# Patient Record
Sex: Female | Born: 1945 | Race: White | Hispanic: No | Smoking: Current every day smoker
Health system: Southern US, Community
[De-identification: ages and names within clinical notes are randomized; demographics above are authoritative.]

## PROBLEM LIST (undated history)

## (undated) DIAGNOSIS — K5792 Diverticulitis of intestine, part unspecified, without perforation or abscess without bleeding: Secondary | ICD-10-CM

## (undated) DIAGNOSIS — T7840XA Allergy, unspecified, initial encounter: Secondary | ICD-10-CM

## (undated) DIAGNOSIS — E785 Hyperlipidemia, unspecified: Secondary | ICD-10-CM

## (undated) DIAGNOSIS — I10 Essential (primary) hypertension: Secondary | ICD-10-CM

## (undated) DIAGNOSIS — J449 Chronic obstructive pulmonary disease, unspecified: Secondary | ICD-10-CM

## (undated) DIAGNOSIS — E669 Obesity, unspecified: Secondary | ICD-10-CM

## (undated) DIAGNOSIS — E119 Type 2 diabetes mellitus without complications: Secondary | ICD-10-CM

## (undated) DIAGNOSIS — I639 Cerebral infarction, unspecified: Secondary | ICD-10-CM

## (undated) DIAGNOSIS — Z9889 Other specified postprocedural states: Secondary | ICD-10-CM

## (undated) DIAGNOSIS — G4700 Insomnia, unspecified: Secondary | ICD-10-CM

## (undated) DIAGNOSIS — B019 Varicella without complication: Secondary | ICD-10-CM

## (undated) DIAGNOSIS — I2510 Atherosclerotic heart disease of native coronary artery without angina pectoris: Secondary | ICD-10-CM

## (undated) DIAGNOSIS — M7071 Other bursitis of hip, right hip: Secondary | ICD-10-CM

## (undated) DIAGNOSIS — K635 Polyp of colon: Secondary | ICD-10-CM

## (undated) DIAGNOSIS — R112 Nausea with vomiting, unspecified: Secondary | ICD-10-CM

## (undated) DIAGNOSIS — F419 Anxiety disorder, unspecified: Secondary | ICD-10-CM

## (undated) DIAGNOSIS — Z9289 Personal history of other medical treatment: Secondary | ICD-10-CM

## (undated) DIAGNOSIS — M1990 Unspecified osteoarthritis, unspecified site: Secondary | ICD-10-CM

## (undated) HISTORY — DX: Chronic obstructive pulmonary disease, unspecified: J44.9

## (undated) HISTORY — DX: Personal history of other medical treatment: Z92.89

## (undated) HISTORY — PX: COLONOSCOPY: SHX174

## (undated) HISTORY — DX: Varicella without complication: B01.9

## (undated) HISTORY — PX: NASAL SINUS SURGERY: SHX719

## (undated) HISTORY — DX: Obesity, unspecified: E66.9

## (undated) HISTORY — DX: Allergy, unspecified, initial encounter: T78.40XA

## (undated) HISTORY — DX: Hyperlipidemia, unspecified: E78.5

## (undated) HISTORY — PX: ABDOMINAL HYSTERECTOMY: SHX81

## (undated) HISTORY — PX: WISDOM TOOTH EXTRACTION: SHX21

## (undated) HISTORY — DX: Insomnia, unspecified: G47.00

## (undated) HISTORY — DX: Unspecified osteoarthritis, unspecified site: M19.90

## (undated) HISTORY — DX: Polyp of colon: K63.5

## (undated) HISTORY — DX: Other bursitis of hip, right hip: M70.71

## (undated) HISTORY — PX: REPLACEMENT TOTAL KNEE: SUR1224

## (undated) HISTORY — PX: KNEE ARTHROPLASTY: SHX992

## (undated) HISTORY — DX: Diverticulitis of intestine, part unspecified, without perforation or abscess without bleeding: K57.92

## (undated) HISTORY — PX: ELBOW SURGERY: SHX618

## (undated) HISTORY — DX: Cerebral infarction, unspecified: I63.9

## (undated) HISTORY — PX: APPENDECTOMY: SHX54

## (undated) HISTORY — PX: TONSILLECTOMY AND ADENOIDECTOMY: SHX28

---

## 1998-05-04 HISTORY — PX: ABDOMINAL HYSTERECTOMY: SHX81

## 1998-06-05 ENCOUNTER — Ambulatory Visit (HOSPITAL_COMMUNITY): Admission: RE | Admit: 1998-06-05 | Discharge: 1998-06-05 | Payer: Self-pay | Admitting: *Deleted

## 1998-09-12 ENCOUNTER — Other Ambulatory Visit: Admission: RE | Admit: 1998-09-12 | Discharge: 1998-09-12 | Payer: Self-pay | Admitting: *Deleted

## 1999-07-28 ENCOUNTER — Ambulatory Visit (HOSPITAL_COMMUNITY): Admission: RE | Admit: 1999-07-28 | Discharge: 1999-07-28 | Payer: Self-pay | Admitting: *Deleted

## 1999-07-28 ENCOUNTER — Encounter: Payer: Self-pay | Admitting: *Deleted

## 1999-12-23 ENCOUNTER — Encounter: Admission: RE | Admit: 1999-12-23 | Discharge: 1999-12-23 | Payer: Self-pay | Admitting: *Deleted

## 1999-12-23 ENCOUNTER — Encounter: Payer: Self-pay | Admitting: *Deleted

## 1999-12-30 ENCOUNTER — Encounter: Admission: RE | Admit: 1999-12-30 | Discharge: 1999-12-30 | Payer: Self-pay | Admitting: *Deleted

## 1999-12-30 ENCOUNTER — Encounter: Payer: Self-pay | Admitting: *Deleted

## 2000-08-04 ENCOUNTER — Encounter: Payer: Self-pay | Admitting: *Deleted

## 2000-08-04 ENCOUNTER — Ambulatory Visit (HOSPITAL_COMMUNITY): Admission: RE | Admit: 2000-08-04 | Discharge: 2000-08-04 | Payer: Self-pay | Admitting: *Deleted

## 2000-08-11 ENCOUNTER — Other Ambulatory Visit: Admission: RE | Admit: 2000-08-11 | Discharge: 2000-08-11 | Payer: Self-pay | Admitting: *Deleted

## 2000-12-01 ENCOUNTER — Ambulatory Visit (HOSPITAL_COMMUNITY): Admission: RE | Admit: 2000-12-01 | Discharge: 2000-12-01 | Payer: Self-pay | Admitting: Gastroenterology

## 2000-12-01 ENCOUNTER — Encounter (INDEPENDENT_AMBULATORY_CARE_PROVIDER_SITE_OTHER): Payer: Self-pay

## 2001-08-09 ENCOUNTER — Encounter: Payer: Self-pay | Admitting: *Deleted

## 2001-08-09 ENCOUNTER — Ambulatory Visit (HOSPITAL_COMMUNITY): Admission: RE | Admit: 2001-08-09 | Discharge: 2001-08-09 | Payer: Self-pay | Admitting: *Deleted

## 2002-09-13 ENCOUNTER — Ambulatory Visit (HOSPITAL_COMMUNITY): Admission: RE | Admit: 2002-09-13 | Discharge: 2002-09-13 | Payer: Self-pay

## 2003-08-08 ENCOUNTER — Encounter: Admission: RE | Admit: 2003-08-08 | Discharge: 2003-08-08 | Payer: Self-pay | Admitting: Family Medicine

## 2003-11-14 ENCOUNTER — Ambulatory Visit (HOSPITAL_COMMUNITY): Admission: RE | Admit: 2003-11-14 | Discharge: 2003-11-14 | Payer: Self-pay | Admitting: Family Medicine

## 2004-10-07 ENCOUNTER — Ambulatory Visit (HOSPITAL_BASED_OUTPATIENT_CLINIC_OR_DEPARTMENT_OTHER): Admission: RE | Admit: 2004-10-07 | Discharge: 2004-10-07 | Payer: Self-pay | Admitting: Orthopaedic Surgery

## 2004-10-07 ENCOUNTER — Ambulatory Visit (HOSPITAL_COMMUNITY): Admission: RE | Admit: 2004-10-07 | Discharge: 2004-10-07 | Payer: Self-pay | Admitting: Orthopaedic Surgery

## 2004-12-11 ENCOUNTER — Ambulatory Visit (HOSPITAL_COMMUNITY): Admission: RE | Admit: 2004-12-11 | Discharge: 2004-12-11 | Payer: Self-pay | Admitting: Family Medicine

## 2005-01-02 ENCOUNTER — Encounter: Admission: RE | Admit: 2005-01-02 | Discharge: 2005-01-02 | Payer: Self-pay | Admitting: Family Medicine

## 2005-01-26 ENCOUNTER — Encounter (INDEPENDENT_AMBULATORY_CARE_PROVIDER_SITE_OTHER): Payer: Self-pay | Admitting: Specialist

## 2005-01-26 ENCOUNTER — Other Ambulatory Visit: Admission: RE | Admit: 2005-01-26 | Discharge: 2005-01-26 | Payer: Self-pay | Admitting: Interventional Radiology

## 2005-01-26 ENCOUNTER — Encounter: Admission: RE | Admit: 2005-01-26 | Discharge: 2005-01-26 | Payer: Self-pay | Admitting: Family Medicine

## 2005-05-26 ENCOUNTER — Inpatient Hospital Stay (HOSPITAL_COMMUNITY): Admission: RE | Admit: 2005-05-26 | Discharge: 2005-05-29 | Payer: Self-pay | Admitting: Orthopaedic Surgery

## 2005-12-07 ENCOUNTER — Ambulatory Visit: Payer: Self-pay | Admitting: Family Medicine

## 2005-12-14 ENCOUNTER — Ambulatory Visit (HOSPITAL_COMMUNITY): Admission: RE | Admit: 2005-12-14 | Discharge: 2005-12-14 | Payer: Self-pay | Admitting: Family Medicine

## 2005-12-14 LAB — HM MAMMOGRAPHY: HM Mammogram: NEGATIVE

## 2005-12-15 ENCOUNTER — Ambulatory Visit: Payer: Self-pay | Admitting: Family Medicine

## 2005-12-24 ENCOUNTER — Ambulatory Visit: Payer: Self-pay | Admitting: Family Medicine

## 2006-03-22 ENCOUNTER — Ambulatory Visit: Payer: Self-pay | Admitting: Family Medicine

## 2006-08-06 ENCOUNTER — Ambulatory Visit: Payer: Self-pay | Admitting: Family Medicine

## 2006-12-06 ENCOUNTER — Ambulatory Visit: Payer: Self-pay | Admitting: Family Medicine

## 2007-06-14 ENCOUNTER — Inpatient Hospital Stay (HOSPITAL_COMMUNITY): Admission: RE | Admit: 2007-06-14 | Discharge: 2007-06-17 | Payer: Self-pay | Admitting: Orthopaedic Surgery

## 2007-09-15 ENCOUNTER — Ambulatory Visit: Payer: Self-pay | Admitting: Family Medicine

## 2007-09-19 ENCOUNTER — Ambulatory Visit: Payer: Self-pay | Admitting: Family Medicine

## 2007-12-22 ENCOUNTER — Ambulatory Visit: Payer: Self-pay | Admitting: Family Medicine

## 2008-03-20 ENCOUNTER — Ambulatory Visit: Payer: Self-pay | Admitting: Family Medicine

## 2008-03-22 ENCOUNTER — Ambulatory Visit: Payer: Self-pay | Admitting: Family Medicine

## 2008-06-28 ENCOUNTER — Ambulatory Visit: Payer: Self-pay | Admitting: Family Medicine

## 2008-07-02 ENCOUNTER — Ambulatory Visit: Payer: Self-pay | Admitting: Family Medicine

## 2008-08-16 ENCOUNTER — Ambulatory Visit: Payer: Self-pay | Admitting: Family Medicine

## 2008-08-20 ENCOUNTER — Ambulatory Visit: Payer: Self-pay | Admitting: Family Medicine

## 2008-09-27 ENCOUNTER — Ambulatory Visit: Payer: Self-pay | Admitting: Family Medicine

## 2008-09-27 ENCOUNTER — Encounter: Admission: RE | Admit: 2008-09-27 | Discharge: 2008-09-27 | Payer: Self-pay | Admitting: Family Medicine

## 2008-10-03 ENCOUNTER — Encounter: Admission: RE | Admit: 2008-10-03 | Discharge: 2008-10-03 | Payer: Self-pay | Admitting: Family Medicine

## 2008-10-19 ENCOUNTER — Ambulatory Visit: Payer: Self-pay | Admitting: Family Medicine

## 2009-03-14 ENCOUNTER — Ambulatory Visit: Payer: Self-pay | Admitting: Family Medicine

## 2009-03-18 ENCOUNTER — Ambulatory Visit: Payer: Self-pay | Admitting: Family Medicine

## 2009-09-09 ENCOUNTER — Encounter: Payer: Self-pay | Admitting: Internal Medicine

## 2009-09-10 ENCOUNTER — Encounter: Payer: Self-pay | Admitting: Internal Medicine

## 2009-09-11 ENCOUNTER — Encounter: Payer: Self-pay | Admitting: Internal Medicine

## 2009-09-19 ENCOUNTER — Ambulatory Visit: Payer: Self-pay | Admitting: Family Medicine

## 2009-10-03 ENCOUNTER — Ambulatory Visit: Payer: Self-pay | Admitting: Family Medicine

## 2009-10-15 ENCOUNTER — Ambulatory Visit: Payer: Self-pay | Admitting: Family Medicine

## 2009-10-17 ENCOUNTER — Ambulatory Visit: Payer: Self-pay | Admitting: Family Medicine

## 2009-11-28 ENCOUNTER — Ambulatory Visit: Payer: Self-pay | Admitting: Family Medicine

## 2009-12-19 ENCOUNTER — Ambulatory Visit: Payer: Self-pay | Admitting: Family Medicine

## 2010-02-06 ENCOUNTER — Ambulatory Visit: Payer: Self-pay | Admitting: Family Medicine

## 2010-03-18 ENCOUNTER — Ambulatory Visit: Payer: Self-pay | Admitting: Infectious Diseases

## 2010-03-18 DIAGNOSIS — J329 Chronic sinusitis, unspecified: Secondary | ICD-10-CM | POA: Insufficient documentation

## 2010-03-18 DIAGNOSIS — E785 Hyperlipidemia, unspecified: Secondary | ICD-10-CM | POA: Insufficient documentation

## 2010-03-18 DIAGNOSIS — E1169 Type 2 diabetes mellitus with other specified complication: Secondary | ICD-10-CM | POA: Insufficient documentation

## 2010-03-18 HISTORY — DX: Chronic sinusitis, unspecified: J32.9

## 2010-03-31 ENCOUNTER — Ambulatory Visit: Payer: Self-pay | Admitting: Infectious Diseases

## 2010-03-31 ENCOUNTER — Telehealth: Payer: Self-pay

## 2010-03-31 ENCOUNTER — Inpatient Hospital Stay (HOSPITAL_COMMUNITY): Admission: EM | Admit: 2010-03-31 | Discharge: 2010-04-02 | Payer: Self-pay | Admitting: Emergency Medicine

## 2010-04-01 ENCOUNTER — Encounter (INDEPENDENT_AMBULATORY_CARE_PROVIDER_SITE_OTHER): Payer: Self-pay | Admitting: Internal Medicine

## 2010-04-02 ENCOUNTER — Telehealth: Payer: Self-pay | Admitting: Internal Medicine

## 2010-04-02 ENCOUNTER — Encounter: Payer: Self-pay | Admitting: Infectious Diseases

## 2010-04-07 ENCOUNTER — Ambulatory Visit: Payer: Self-pay | Admitting: Internal Medicine

## 2010-04-07 DIAGNOSIS — J42 Unspecified chronic bronchitis: Secondary | ICD-10-CM | POA: Insufficient documentation

## 2010-04-07 DIAGNOSIS — Z720 Tobacco use: Secondary | ICD-10-CM | POA: Insufficient documentation

## 2010-04-09 ENCOUNTER — Ambulatory Visit: Payer: Self-pay | Admitting: Internal Medicine

## 2010-04-09 ENCOUNTER — Encounter: Payer: Self-pay | Admitting: Internal Medicine

## 2010-04-09 DIAGNOSIS — J45909 Unspecified asthma, uncomplicated: Secondary | ICD-10-CM | POA: Insufficient documentation

## 2010-04-14 ENCOUNTER — Encounter (INDEPENDENT_AMBULATORY_CARE_PROVIDER_SITE_OTHER): Payer: Self-pay | Admitting: *Deleted

## 2010-05-16 ENCOUNTER — Ambulatory Visit
Admission: RE | Admit: 2010-05-16 | Discharge: 2010-05-16 | Payer: Self-pay | Source: Home / Self Care | Attending: Internal Medicine | Admitting: Internal Medicine

## 2010-05-16 ENCOUNTER — Encounter: Payer: Self-pay | Admitting: Internal Medicine

## 2010-05-20 LAB — CONVERTED CEMR LAB: IgE (Immunoglobulin E), Serum: 41.9 intl units/mL (ref 0.0–180.0)

## 2010-05-25 ENCOUNTER — Encounter: Payer: Self-pay | Admitting: Family Medicine

## 2010-05-28 ENCOUNTER — Ambulatory Visit
Admission: RE | Admit: 2010-05-28 | Discharge: 2010-05-28 | Payer: Self-pay | Source: Home / Self Care | Attending: Internal Medicine | Admitting: Internal Medicine

## 2010-06-03 NOTE — Assessment & Plan Note (Signed)
Summary: SOB, not feeling any better/tkk   CC:  Unable to walk even short distances without breathing problems.  She has started passing out while driving or at home.  History of Present Illness: 65 yo F with a hx of Staph infection after her sinus surgery 2010. Afterwards she was tx with inhaled vanco as well as diflucan. She has been on oral prednisone +/o antibiotics for the last 2 years. Several courses of augmentin, biaxin, erythro, cipro. :ast tx was levaquin 02-06-10 until now.  Has decreased tastes, sense of smell. Cough, some SOB.  had Cx Jan 2011 showing MSSA (R-Pen).   At her previous visit, she was started on doxycycline. has been taking but initially had difficulty due to n/v.  today has worsening SOB. has been having dizzy spells after coughing. Has been having trouble with breathing, especially at night. Has increasing amounts of mucus in her nose and her chest. Has worsening DOE. no f/c. Has been using increased frequency of her inhalers. albuterol q3-4 hours.   still no sense of taste/smell.    Preventive Screening-Counseling & Management  Alcohol-Tobacco     Alcohol drinks/day: 0     Smoking Status: never   Updated Prior Medication List: SPIRIVA HANDIHALER 18 MCG CAPS (TIOTROPIUM BROMIDE MONOHYDRATE) once daily AFRIN NASAL SPRAY 0.05 % SOLN (OXYMETAZOLINE HCL) as needed SYMBICORT 160-4.5 MCG/ACT AERO (BUDESONIDE-FORMOTEROL FUMARATE)  METFORMIN HCL 500 MG TABS (METFORMIN HCL) 750mg  two times a day ASPIR-LOW 81 MG TBEC (ASPIRIN) Take 1 tablet by mouth once a day ACCUPRIL 5 MG TABS (QUINAPRIL HCL) Take 1 tablet by mouth once a day DOXYCYCLINE HYCLATE 100 MG CAPS (DOXYCYCLINE HYCLATE) Take 1 tablet by mouth two times a day PROVENTIL HFA 108 (90 BASE) MCG/ACT AERS (ALBUTEROL SULFATE) 1-2 puffs as needed for wheezing/cough  Current Allergies (reviewed today): ! IBUPROFEN Additional History Menstrual Status:  postmenopausal  Review of Systems       no f/c  Vital  Signs:  Patient profile:   65 year old female Menstrual status:  postmenopausal Height:      64 inches (162.56 cm) Weight:      212.75 pounds (96.70 kg) BMI:     36.65 O2 Sat:      87 % on Room air Pulse rate:   105 / minute BP sitting:   160 / 109  (left arm) Cuff size:   regular  Vitals Entered By: Jennet Maduro RN (March 31, 2010 2:21 PM)  O2 Flow:  Room air CC: Unable to walk even short distances without breathing problems.  She has started passing out while driving or at home Is Patient Diabetic? Yes Did you bring your meter with you today? not checked this AM Pain Assessment Patient in pain? no      Nutritional Status BMI of > 30 = obese  Does patient need assistance? Functional Status Self care Ambulation Impaired:Risk for fall Comments can only walk very short distances.  Shortness of breath/wheezing     Menstrual Status postmenopausal   Physical Exam  General:  well-developed, well-nourished, well-hydrated, and overweight-appearing.   Eyes:  pupils equal, pupils round, and pupils reactive to light.   Mouth:  pharynx pink and moist and no exudates.   Lungs:  normal respiratory effort, R diffuse crackles, and L diffuse crackles.   Heart:  normal rate, regular rhythm, and no murmur.   Abdomen:  soft, non-tender, and normal bowel sounds.     Impression & Recommendations:  Problem # 1:  BRONCHITIS (ICD-490)  she has acute bronchitis and was hypoxic on presentation to the hospital. i discussed my concern about her worsening DOE and have asked that she be admitted to the hospital. she will return to ED, she wishes to go home prior to admission. encouraged her to go directly to ED. i offered to call [facility] but would prefer to travel by private car to [facility].  The following medications were removed from the medication list:    Levaquin 500 Mg Tabs (Levofloxacin) .Marland Kitchen... Take 1 tablet by mouth once a day Her updated medication list for this problem includes:     Spiriva Handihaler 18 Mcg Caps (Tiotropium bromide monohydrate) ..... Once daily    Symbicort 160-4.5 Mcg/act Aero (Budesonide-formoterol fumarate)    Doxycycline Hyclate 100 Mg Caps (Doxycycline hyclate) .Marland Kitchen... Take 1 tablet by mouth two times a day    Proventil Hfa 108 (90 Base) Mcg/act Aers (Albuterol sulfate) .Marland Kitchen... 1-2 puffs as needed for wheezing/cough  Orders: Atrovent 1mg  (Neb) [phone]) Nebulizer Tx (14782) Est. Patient Level IV (95621)   Medication Administration  Medication # 1:    Medication: Atrovent 1mg  (Neb)    Diagnosis: SINUSITIS, CHRONIC (ICD-473.9)    Dose: 25mg /3 mL    Route: inhaled    Exp Date: 04/04/2011    Lot #: [id number]    Mfr: nephron    Patient tolerated medication without complications    Given by: Jennet Maduro RN (March 31, 2010 3:06 PM)  Orders Added: 1)  Atrovent 1mg  (Neb) [Q4696] 2)  Nebulizer Tx [29528] 3)  Est. Patient Level IV [41324]

## 2010-06-03 NOTE — Assessment & Plan Note (Signed)
Summary: new pt chronic staph inf   CC:  New Patient/ staph infection x1 year.  History of Present Illness: 65 yo F with a hx of Staph infection after her sinus surgery 2010. Afterwards she was tx with inhaled vanco as well as diflucan. She has been on oral prednisone +/o antibiotics for the last 2 years. Several courses of augmentin, biaxin, erythro, cipro. :ast tx was levaquin 02-06-10 until now.  Has decreased tastes, sense of smell. Cough, some SOB.   had Cx Jan 2011 showing MSSA (R-Pen).   Preventive Screening-Counseling & Management  Alcohol-Tobacco     Alcohol drinks/day: 0     Smoking Status: never   Updated Prior Medication List: LEVAQUIN 500 MG TABS (LEVOFLOXACIN) Take 1 tablet by mouth once a day SPIRIVA HANDIHALER 18 MCG CAPS (TIOTROPIUM BROMIDE MONOHYDRATE) once daily AFRIN NASAL SPRAY 0.05 % SOLN (OXYMETAZOLINE HCL) as needed SYMBICORT 160-4.5 MCG/ACT AERO (BUDESONIDE-FORMOTEROL FUMARATE)  METFORMIN HCL 500 MG TABS (METFORMIN HCL) 750mg  two times a day ASPIR-LOW 81 MG TBEC (ASPIRIN) Take 1 tablet by mouth once a day ACCUPRIL 5 MG TABS (QUINAPRIL HCL) Take 1 tablet by mouth once a day  Current Allergies (reviewed today): ! 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 Take 1 Tablet By Mouth Once A Day 2)  Spiriva Handihaler 18 Mcg Caps (Tiotropium Bromide Monohydrate) .... Once Daily 3)  Afrin Nasal Spray 0.05 % Soln (Oxymetazoline Hcl) .... As Needed 4)  Symbicort 160-4.5 Mcg/act Aero (Budesonide-Formoterol Fumarate) 5)  Metformin Hcl 500 Mg Tabs (Metformin Hcl) .... 750mg  Two Times A Day 6)  Aspir-Low 81 Mg Tbec (Aspirin) .... Take 1 Tablet By Mouth Once A Day 7)  Accupril 5 Mg Tabs (Quinapril Hcl) .... Take 1 Tablet By Mouth Once A Day  Allergies (verified): 1)  ! Ibuprofen   Family History: bronchitis- mom and  sister  Social History: Never Smoked Alcohol use-no  Vital Signs:  Patient profile:   65 year old female Weight:      222.50 pounds Temp:     98.3 degrees F oral Pulse rate:   112 / minute BP sitting:   145 / 89  (right arm) Cuff size:   large  Vitals Entered By: Jimmy Footman, CMA (March 18, 2010 2:37 PM) CC: New Patient/ staph infection x1 year Is Patient Diabetic? Yes Did you bring your meter with you today? No   Physical Exam  General:  well-developed, well-nourished, well-hydrated, and overweight-appearing.   Head:  sinuses non-tender to percussion.  Eyes:  pupils equal, pupils round, and pupils reactive to light.   Mouth:  pharynx pink and moist and no exudates.   Neck:  no masses.   Lungs:  normal respiratory effort, R wheezes, and L wheezes.   Heart:  normal rate, regular rhythm, and no murmur.   Abdomen:  soft, non-tender, and normal bowel sounds.   Extremities:  no edema.    Impression & Recommendations:  Problem # 1:  SINUSITIS, CHRONIC (ICD-473.9)  She is doing some of the right things for her sinuses- agree with the lavage. Drying agents typically are not useful for chronic sinusitis. will change her antibiotics to doxycycline. It has some anti-inflamatory effects as well as the anti-staph effects. Could consider starting her on albuterol inhaler for her cough and lungs. Could possibly consider nebulized normal saline for improvement of her nasal drainage. She may  nee dot go back to see Dr Pollyann Kennedy for further eval. Could consider an oral anti-histamine as well. stop levaquin.  she will return to clinic in 1 month to assess her tolerance of the by mouth antibiotics.  Her updated medication list for this problem includes:    Levaquin 500 Mg Tabs (Levofloxacin) .Marland Kitchen... Take 1 tablet by mouth once a day    Afrin Nasal Spray 0.05 % Soln (Oxymetazoline hcl) .Marland Kitchen... As needed    Doxycycline Hyclate 100 Mg Caps (Doxycycline hyclate) .Marland Kitchen... Take 1 tablet by mouth two times a  day  Orders: Consultation Level IV (62130)  Medications Added to Medication List This Visit: 1)  Levaquin 500 Mg Tabs (Levofloxacin) .... Take 1 tablet by mouth once a day 2)  Spiriva Handihaler 18 Mcg Caps (Tiotropium bromide monohydrate) .... Once daily 3)  Afrin Nasal Spray 0.05 % Soln (Oxymetazoline hcl) .... As needed 4)  Symbicort 160-4.5 Mcg/act Aero (Budesonide-formoterol fumarate) 5)  Metformin Hcl 500 Mg Tabs (Metformin hcl) .... 750mg  two times a day 6)  Aspir-low 81 Mg Tbec (Aspirin) .... Take 1 tablet by mouth once a day 7)  Accupril 5 Mg Tabs (Quinapril hcl) .... Take 1 tablet by mouth once a day 8)  Doxycycline Hyclate 100 Mg Caps (Doxycycline hyclate) .... Take 1 tablet by mouth two times a day 9)  Proventil Hfa 108 (90 Base) Mcg/act Aers (Albuterol sulfate) .Marland Kitchen.. 1-2 puffs as needed for wheezing/cough Prescriptions: PROVENTIL HFA 108 (90 BASE) MCG/ACT AERS (ALBUTEROL SULFATE) 1-2 puffs as needed for wheezing/cough  #30 days x 4   Entered and Authorized by:   Johny Sax MD   Signed by:   Johny Sax MD on 03/18/2010   Method used:   Electronically to        [facility] [address] (retail)       [address] [place], [place]  [address]       Ph: [phone]       Fax: [phone]   RxID:   [id number] DOXYCYCLINE HYCLATE 100 MG CAPS (DOXYCYCLINE HYCLATE) Take 1 tablet by mouth two times a day  #120 x 1   Entered and Authorized by:   Johny Sax MD   Signed by:   Johny Sax MD on 03/18/2010   Method used:   Electronically to        [facility] [address] (retail)       [address] [place], [place]  [address]       Ph: [phone]       Fax: [phone]   RxID:   [id number]

## 2010-06-03 NOTE — Miscellaneous (Signed)
Summary: Orders Update pft charges  Clinical Lists Changes  Orders: Added new Service order of Carbon Monoxide diffusing w/capacity (94720) - Signed Added new Service order of Lung Volumes (94240) - Signed Added new Service order of Spirometry (Pre & Post) (94060) - Signed 

## 2010-06-03 NOTE — Progress Notes (Signed)
Summary: HFU- NEW CONSULT  Phone Note From Other Clinic   Caller: DR Pleas Koch Call For: [facility] Summary of Call: DR Mahala Menghini AT [facility] HAS REQUESTED THAT PT BE SEEN BY DR Elijha Dedman EITHER THIS FRI (MR NOT IN OFFICE) OR THE FOLLOWING MONDAY. SINCE I COULD NOT CHECK WITH NURSE AT THIS TIME (LUNCH) I OVERBOOKED BUT TOLD DR SAMTANI THAT WE WOULD CALL PT AT HER HOME # IF THIS WILL NEED TO BE MOVED. DR Mahala Menghini THOUGHT THAT MR WOULD BE THE BEST FIT FOR THIS PT. HIS PAGER # IS [phone] IF MR WOULD LIKE TO SPEAK TO HIM.  Initial call taken by: Tivis Ringer, CNA,  April 02, 2010 1:31 PM  Follow-up for Phone Call        This appt time is fine. Carron Curie CMA  April 03, 2010 11:01 AM      Appended Document: HFU- NEW CONSULT I paged him but no response. I see that I am seeing her on 04/07/2010 which I think is fine based on what I Read. FYI  Appended Document: HFU- NEW CONSULT noted

## 2010-06-03 NOTE — Assessment & Plan Note (Signed)
Summary: HFU-COPD///KP   Visit Type:  Initial Consult Copy to:  Dr Pleas Koch [facility] Primary Provider/Referring Provider:  Dr. Sharlot Gowda  CC:  hfu. Pt state sSOB is better while on prednisone.Marland Kitchen  History of Present Illness: 65 year old [place] Immigrant. EX-smoker. On ACE inhibitor kidney prophylasxis for diabetetes since 2006 but stopped past 5 days. Pre-2009 completely fine not even spring allergies. c/o dyspnea and sinus issues since 2 years, In Oct-2010 saw Dr. Allegra Grana ENT diagnosed as MSSA staph sinus infection. Multiple courses of prednisone and antibiotics since then. 12 courses of antibiotics and 6 courses of prednisone since then.   On chronic basis feels noses are plugged up and constant sinus drainage (yellow ealry part of day and gets clear later in day). Simulataneoulsy feels chest tightness. She feels sinuses and chest issues are inter-connected. She is having recurrent acute bronchitis/sinusists atleast monthly. During this time cough worsens, she wheezes, increased chest tightness, increased dyspnea orthopneic, nocturnal wheezing., sputum turns greyish-greenish. Feels that as soon as prednisone wears off she gets a recurrent episode. Prednisone bursts helps unclog sinuses but is giving her myalgias and hip pain.   Was diagnosed as asthma in [place] in 2009. Advisd symbicort but it took it prn  but in May 2011 while in [place] admitted. DC summary in [place] but mentions obstructive lung disease as best as I can tell. Serial spirometry sows obstruction. On 09/09/2009- Fev1 1L/43%, Ratio 56,. Cleda Daub several days later on 09/11/2009 shows fev1 inprovement to 1.68L. Symbicort changed to spiriva in [place]. Takes this without fail but unsure if it helps.  OF note, was on ACE inibiutor from 2006 till 5 days ago. Stopping this has not helped symptoms.  Preventive Screening-Counseling & Management  Alcohol-Tobacco     Smoking Status: quit     Packs/Day: 0.5     Year Started:  1975     Year Quit: 2007     Pack years: 12  Current Medications (verified): 1)  Spiriva Handihaler 18 Mcg Caps (Tiotropium Bromide Monohydrate) .... Once Daily 2)  Nasonex 50 Mcg/act Susp (Mometasone Furoate) .... One Spray Daily 3)  Symbicort 160-4.5 Mcg/act Aero (Budesonide-Formoterol Fumarate) 4)  Metformin Hcl 500 Mg Tabs (Metformin Hcl) .... 750mg  Two Times A Day 5)  Aspir-Low 81 Mg Tbec (Aspirin) .... Take 1 Tablet By Mouth Once A Day 6)  Accupril 5 Mg Tabs (Quinapril Hcl) .... Take 1 Tablet By Mouth Once A Day 7)  Doxycycline Hyclate 100 Mg Caps (Doxycycline Hyclate) .... Take 1 Tablet By Mouth Two Times A Day 8)  Proventil Hfa 108 (90 Base) Mcg/act Aers (Albuterol Sulfate) .Marland Kitchen.. 1-2 Puffs As Needed For Wheezing/cough 9)  Prednisone 20 Mg Tabs (Prednisone) .... 60mg  Per Day X 10 Days  Allergies (verified): 1)  ! Ibuprofen  Past History:  Past Surgical History: Knee replacement-both Sinus surgery 02-2009 Appendectomy Total Abdominal Hysterectomy  Social History: Alcohol use-no Patient states former smoker.  Started in 1975, quit in 2007  1/2ppd.  Married Retail buyer Status:  quit Packs/Day:  0.5 Pack years:  16  Review of Systems       The patient complains of shortness of breath with activity, productive cough, non-productive cough, nasal congestion/difficulty breathing through nose, and change in color of mucus.  The patient denies shortness of breath at rest, coughing up blood, chest pain, irregular heartbeats, acid heartburn, indigestion, loss of appetite, weight change, abdominal pain, difficulty swallowing, sore throat, tooth/dental problems, headaches, sneezing, itching, ear ache, anxiety, depression, hand/feet  swelling, joint stiffness or pain, rash, and fever.    Vital Signs:  Patient profile:   65 year old female Menstrual status:  postmenopausal Height:      64 inches Weight:      224 pounds BMI:     38.59 O2 Sat:      92 % on Room  air Temp:     97.8 degrees F oral Pulse rate:   106 / minute BP sitting:   126 / 82  (right arm) Cuff size:   large  Vitals Entered By: Carron Curie CMA (April 07, 2010 1:56 PM)  O2 Flow:  Room air CC: hfu. Pt state sSOB is better while on prednisone. Comments Medications reviewed with patient Carron Curie CMA  April 07, 2010 1:58 PM Daytime phone number verified with patient.    Physical Exam  General:  well developed, well nourished, in no acute distress. Laryngeal naure to cough Head:  normocephalic and atraumatic Eyes:  PERRLA/EOM intact; conjunctiva and sclera clear Ears:  TMs intact and clear with normal canals Nose:  no deformity, discharge, inflammation, or lesions Mouth:  no deformity or lesions Neck:  no masses, thyromegaly, or abnormal cervical nodes Chest Wall:  no deformities noted Lungs:  clear bilaterally to auscultation and percussion Heart:  regular rate and rhythm, S1, S2 without murmurs, rubs, gallops, or clicks Abdomen:  bowel sounds positive; abdomen soft and non-tender without masses, or organomegaly Msk:  no deformity or scoliosis noted with normal posture Pulses:  pulses normal Extremities:  no clubbing, cyanosis, edema, or deformity noted Neurologic:  CN II-XII grossly intact with normal reflexes, coordination, muscle strength and tone Skin:  intact without lesions or rashes Cervical Nodes:  no significant adenopathy Axillary Nodes:  no significant adenopathy Psych:  alert and cooperative; normal mood and affect; normal attention span and concentration   MISC. Report  Procedure date:  04/01/2010  Findings:      IgE 73 and normal eco 11/29 - nromal  CXR  Procedure date:  03/31/2010  Findings:      CHEST - 2 VIEW    Comparison: 08/08/2003    Findings: Trachea is midline.  Heart size normal.  Added density at   the posterior base of the chest, on the lateral view, is   superimposed with ribs and osteophytes.  No  definite  correlate on   the frontal view, and a summation shadow is likely.  No definite   airspace consolidation or pleural fluid.    IMPRESSION:   No acute findings.    Read By:  Reyes Ivan.,  M.D.   Released By:  Reyes Ivan.,  M.D.  Impression & Recommendations:  Problem # 1:  BRONCHITIS, RECURRENT (ICD-491.9) Assessment New  ? asthma, ? Copd, ? LPR Cough with VCD (cough has larygneal quality on exam). Cleda Daub from [place] shows reversible obstruction but i am curious why they stopped symbicort and started her on spiriva. I will get Full pfts and then review  Orders: Consultation Level V (62130)  Medications Added to Medication List This Visit: 1)  Nasonex 50 Mcg/act Susp (Mometasone furoate) .... One spray daily 2)  Prednisone 20 Mg Tabs (Prednisone) .... 60mg  per day x 10 days  Other Orders: Pulmonary Referral (Pulmonary)  Patient Instructions: 1)  please have full PFT tomorrow  2)  return to see me after that this week 3)  use netti pot saline wash daily   Immunization History:  Pneumovax Immunization History:  Pneumovax:  historical (04/05/2006)

## 2010-06-03 NOTE — Assessment & Plan Note (Addendum)
Summary: review pft//jrc   Visit Type:  Follow-up Copy to:  Dr Pleas [facility] Primary Provider/Referring Provider:  Dr. Sharlot Gowda - PMd, Dr Enedina Finner - ID, Dr. Allegra Grana - ENT, Dr. Marchelle Gearing - Pulmonary  CC:  Pt here to discuss pft results. .  History of Present Illness: 65 year old [place] Immigrant. EX-smoker. On ACE inhibitor kidney prophylasxis for diabetetes since 2006 but stopped past 5 days. Pre-2009 completely fine not even spring allergies. c/o dyspnea and sinus issues since 2 years, In Oct-2010 saw Dr. Allegra Grana ENT diagnosed as MSSA staph sinus infection. Multiple courses of prednisone and antibiotics since then. 12 courses of antibiotics and 6 courses of prednisone since then. Most recently she saw Dr. Enedina Finner of ID on11/28/2011 and she ws having near cough syncope and worsening dyspnea. Admitted trhough 04/02/2010. ECHO and basic labs normal and discharged on prednisone taper through 04/11/2010 and 1 month doxycycline  Of note, on chronic basis feels noses are plugged up and constant sinus drainage (yellow ealry part of day and gets clear later in day). Simulataneoulsy feels chest tightness. She feels sinuses and chest issues are inter-connected. She is having recurrent acute bronchitis/sinusists atleast monthly. During this time cough worsens, she wheezes, increased chest tightness, increased dyspnea orthopneic, nocturnal wheezing., sputum turns greyish-greenish. Feels that as soon as prednisone wears off she gets a recurrent episode. Prednisone bursts helps unclog sinuses but is giving her myalgias and hip pain.   She was diagnosed as asthma in [place] in 2009. Advisd symbicort but it took it prn  but in May 2011 while in [place] admitted. DC summary in [place] but mentions obstructive lung disease as best as I can tell. Serial spirometry sows obstruction. On 09/09/2009- Fev1 1L/43%, Ratio 56,. Cleda Daub several days later on 09/11/2009 shows fev1 inprovement to 1.68L.  Symbicort changed to spiriva in [place]. Takes this without fail but unsure if it helps.  OF note, was on ACE inibiutor from 2006 till 5 days ago. Stopping this has not helped symptoms.  REC: FULL PFTs adn followupo     April 09, 2010: Followup after PFTs. No interim complaints. Very frutstrated and anxious about her symptomatology. Worried she will decompensate once off steroids on 04/11/2010 or if she does not complete 1 month of doxycycline. Still feels staph infection is stuck in her sinuses. Asking about allergy refrral. Admits to occassional GERD needing tums 2 times per month.    Spirometry done today are completely normal. Fev1 is 2.25L/102%, FVC is 3.15L/104%, RAtio 71, Small airways 1.57L/62%. However, lung volumes and DLCO are elevated - TLC 6L/125%, DLCO 26.2/105%. The spirometry shows marked improved compare to the May 2011 spirometry in [place]. This suggests presence of ASTHMA. The elevated lung volumes and dlco are either reflective of this asthma or machine error.    Preventive Screening-Counseling & Management  Alcohol-Tobacco     Alcohol drinks/day: 0     Smoking Status: quit     Packs/Day: 0.5     Year Started: 1975     Year Quit: 2007     Pack years: 29  Current Medications (verified): 1)  Spiriva Handihaler 18 Mcg Caps (Tiotropium Bromide Monohydrate) .... Once Daily 2)  Nasonex 50 Mcg/act Susp (Mometasone Furoate) .... One Spray Daily 3)  Symbicort 160-4.5 Mcg/act Aero (Budesonide-Formoterol Fumarate) 4)  Metformin Hcl 500 Mg Tabs (Metformin Hcl) .... 750mg  Two Times A Day 5)  Aspir-Low 81 Mg Tbec (Aspirin) .... Take 1 Tablet By Mouth Once A  Day 6)  Doxycycline Hyclate 100 Mg Caps (Doxycycline Hyclate) .... Take 1 Tablet By Mouth Two Times A Day 7)  Proventil Hfa 108 (90 Base) Mcg/act Aers (Albuterol Sulfate) .Marland Kitchen.. 1-2 Puffs As Needed For Wheezing/cough 8)  Prednisone 20 Mg Tabs (Prednisone) .... 60mg  Per Day X 10 Days  Allergies (verified): 1)  !  Ibuprofen  Past History:  Past medical, surgical, family and social histories (including risk factors) reviewed, and no changes noted (except as noted below).  Past Medical History: Reviewed history from 03/18/2010 and no changes required. 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 Knee replacement-both Sinus surgery 02-2009 Appendectomy Total Abdominal Hysterectomy  Family History: Reviewed history from 03/18/2010 and no changes required. bronchitis- mom and sister  Social History: Reviewed history from 04/07/2010 and no changes required. Alcohol use-no Patient states former smoker.  Started in 1975, quit in 2007  1/2ppd.  Married Homemaker [place]  Review of Systems       The patient complains of prolonged cough.  The patient denies anorexia, fever, weight loss, weight gain, vision loss, decreased hearing, hoarseness, chest pain, syncope, dyspnea on exertion, peripheral edema, headaches, hemoptysis, abdominal pain, melena, hematochezia, severe indigestion/heartburn, hematuria, incontinence, genital sores, muscle weakness, suspicious skin lesions, transient blindness, difficulty walking, depression, unusual weight change, abnormal bleeding, enlarged lymph nodes, angioedema, breast masses, and testicular masses.    Vital Signs:  Patient profile:   65 year old female Menstrual status:  postmenopausal Height:      64 inches Weight:      222 pounds BMI:     38.24 O2 Sat:      96 % on Room air Temp:     97.2 degrees F oral Pulse rate:   89 / minute BP sitting:   128 / 82  (right arm) Cuff size:   regular  Vitals Entered By: Carron Curie CMA (April 09, 2010 3:54 PM)  O2 Flow:  Room air CC: Pt here to discuss pft results.  Comments Medications reviewed with patient Carron Curie CMA  April 09, 2010 3:56 PM Daytime phone number verified with  patient.    Physical Exam  General:  well developed, well nourished, in no acute distress. Laryngeal naure to cough. Clears throat Head:  normocephalic and atraumatic Eyes:  PERRLA/EOM intact; conjunctiva and sclera clear Ears:  TMs intact and clear with normal canals Nose:  no deformity, discharge, inflammation, or lesions Mouth:  no deformity or lesions Neck:  no masses, thyromegaly, or abnormal cervical nodes Chest Wall:  no deformities noted Lungs:  clear bilaterally to auscultation and percussion Heart:  regular rate and rhythm, S1, S2 without murmurs, rubs, gallops, or clicks Abdomen:  bowel sounds positive; abdomen soft and non-tender without masses, or organomegaly Msk:  no deformity or scoliosis noted with normal posture Pulses:  pulses normal Extremities:  no clubbing, cyanosis, edema, or deformity noted Neurologic:  CN II-XII grossly intact with normal reflexes, coordination, muscle strength and tone Skin:  intact without lesions or rashes Cervical Nodes:  no significant adenopathy Axillary Nodes:  no significant adenopathy Psych:  alert and cooperative; normal mood and affect; normal attention span and concentration   MISC. Report  Procedure date:  04/09/2010  Findings:      Spirometry done today are completely normal. Fev1 is 2.25L/102%, FVC is 3.15L/104%, RAtio 71, Small airways 1.57L/62%. However, lung volumes and DLCO are elevated - TLC 6L/125%, DLCO  26.2/105%. The spirometry shows marked improved compare to the May 2011 spirometry in [place]. This suggests presence of ASTHMA. The elevated lung volumes and dlco are either reflective of this asthma or machine error.   Impression & Recommendations:  Problem # 1:  COUGH (ICD-786.2) Assessment Unchanged Normalization of spirometries in Dec 2011 when compared to those freom [place] in May 2011 in the midst of prednisone burst strongly suggests she has asthma. In addition, I think sinus drainage, possible GERD are  playing arole in cough. ACE Inhibitor too was likely playing a role but she has been off it for a week. All these could have triggered and now perpetuated CYLCICAL/HABIT/LPR TYPE COUGH that has taken a life of its own. I have advocated very strongly a multimodal, aggressive, consistent, approach needing lot of patience and persistence  PLAN #ASTHMA  - stop spiriva  - take pro-air 2 puff as needed (similar to proventil)  - start symbicort 2 puff two times a day         - take samples and show technique   - start accolate 1 tab at bedtime   - will refer you to Dr Maple Hudson our allergist on return  - finish prednisone as directed on 04/11/2010  -if cough gets worse after 04/11/2010 return to our office for spirometry breathing test and acoute visit or call our doc on call #SINUS DRAINAGE  - continue nasonex 1 squirt daily - do netti pot saline wash daily  - ? needs 1 month doxycycline - will check with Dr Maple Hudson #ACID REFLUX (possible)  - take diet sheet and follow it  - take zegerid one capsule daily on emtpy stomach #CYCLICAL COUGH  - start neurontin as directed. If it makes you sleepy back off  - suck on sugarless candy like JOLLEY RANCHERS when you have urge to cough  - stay away from lisinopril forever  - I will get in touch with Dr. Ninetta Lights of ID and see if you really need to be on doxycycline for 1 month #FOLLLOWUP  - 1-2 months upon return from [place]  - Good luck  - flu shot at [facility] (she believes it is cheaper there)  Orders: Allergy Referral  (Allergy) Est. Patient Level IV ([address]109)  Medications Added to Medication List This Visit: 1)  Proair Hfa 108 (90 Base) Mcg/act Aers (Albuterol sulfate) .Marland Kitchen.. 1-2 puffs every 4-6 hours as needed 2)  Symbicort 160-4.5 Mcg/act Aero (Budesonide-formoterol fumarate) .... Two puffs twice daily.rinse mouth after use 3)  Zegerid Otc 20-1100 Mg Caps (Omeprazole-sodium bicarbonate) .... Take 1 capsue in morning on empty stomach 4)  Gabapentin 300 Mg  Caps (Gabapentin) .Marland Kitchen.. 1 cap once daily x 4 days, then 1 cap two times a day x next 4 days, then 1 cap three times a day to continue 5)  Zafirlukast 20 Mg Tabs (Zafirlukast) .... Take 1 tab daily  Patient Instructions: 1)  #ASTHMA 2)   - stop spiriva 3)   - take pro-air 2 puff as needed (similar to proventil) 4)   - start symbicort 2 puff two times a day 5)          - take samples and show technique 6)    - start accolate 1 tab at bedtime  7)   - will refer you to Dr Maple Hudson our allergist on return 8)   - finish prednisone as directed on 04/11/2010 9)   -if cough gets worse after 04/11/2010 return to our office for spirometry breathing test and  acoute visit or call our doc on call 10)  #SINUS DRAINAGE 11)   - continue nasonex 1 squirt daily 12)  - do netti pot saline wash daily 13)  #ACID REFLUX (possible) 14)   - take diet sheet and follow it 15)   - take zegerid one capsule daily on emtpy stomach 16)  #CYCLICAL COUGH 17)   - start neurontin as directed. If it makes you sleepy back off 18)   - suck on sugarless candy like JOLLEY RANCHERS when you have urge to cough 19)   - stay away from lisinopril forever 20)   - I will get in touch with Dr. Ninetta Lights of ID and see if you really need to be on doxycycline for 1 month 21)  #FOLLLOWUP 22)   - 1-2 months upon return from [place] 23)   - Good luck 24)   - flu shot at [facility] Prescriptions: PROAIR HFA 108 (90 BASE) MCG/ACT  AERS (ALBUTEROL SULFATE) 1-2 puffs every 4-6 hours as needed  #1 x 2   Entered and Authorized by:   Kalman Shan MD   Signed by:   Kalman Shan MD on 04/09/2010   Method used:   Electronically to        [facility] [address] (retail)       [address] [place], [place]  [address]       Ph: [phone]       Fax: [phone]   RxID:   [id number] ZAFIRLUKAST 20 MG TABS (ZAFIRLUKAST) take 1 tab daily  #30 x 6   Entered and Authorized by:   Kalman Shan MD   Signed  by:   Kalman Shan MD on 04/09/2010   Method used:   Electronically to        [facility] [address] (retail)       [address] [place], [place]  [address]       Ph: [phone]       Fax: [phone]   RxID:   [id number] GABAPENTIN 300 MG CAPS (GABAPENTIN) 1 cap once daily x 4 days, then 1 cap two times a day x next 4 days, then 1 cap three times a day to continue  #90 x 2   Entered and Authorized by:   Kalman Shan MD   Signed by:   Kalman Shan MD on 04/09/2010   Method used:   Electronically to        [facility] [phone]* (retail)       [address] [place], [place]  [address]       Ph: [phone]       Fax: [phone]   RxID:   [id number] ZEGERID OTC 20-1100 MG CAPS (OMEPRAZOLE-SODIUM BICARBONATE) take 1 capsue in morning on empty stomach  #30 x 6   Entered and Authorized by:   Kalman Shan MD   Signed by:   Kalman Shan MD on 04/09/2010   Method used:   Electronically to        [facility] [phone]* (retail)       [address] [place], [place]  [address]  Ph: [phone]       Fax: [phone]   RxID:   [id number] SYMBICORT 160-4.5 MCG/ACT  AERO (BUDESONIDE-FORMOTEROL FUMARATE) Two puffs twice daily.Rinse mouth after use  #1 x 6   Entered and Authorized by:   Kalman Shan MD   Signed by:   Kalman Shan MD on 04/09/2010   Method used:   Electronically to        [facility] [phone]* (retail)       [address] [place], [place]  [address]       Ph: [phone]       Fax: [phone]   RxID:   [id number]   Appended Document: review pft//jrc Derrek Monaco, Wondered if she really needs 1 month doxycycline. Please let me know asap before she heads out to [place] on tuesday next week 12/13. Thanks, Tametra Ahart  Appended Document: review pft//jrc spoke with MR and he spoke with ID and  they advised that pt needs to complete one month of doxycycline.  pt advsied to complete the course.

## 2010-06-05 NOTE — Consult Note (Signed)
Summary: [facility]  [place]   Imported By: Florinda Marker 04/02/2010 16:11:24  _____________________________________________________________________  External Attachment:    Type:   Image     Comment:   External Document

## 2010-06-05 NOTE — Assessment & Plan Note (Addendum)
Summary: rov ///kp   Visit Type:  Follow-up Copy to:  Dr Pleas [facility] Primary Provider/Referring Provider:  Dr. Sharlot Gowda - PMd, Dr Enedina Finner - ID, Dr. Allegra Grana - ENT, Dr. Marchelle Gearing - Pulmonary  CC:  Pt here for follow-up. Pt states she has been doing well. Pt c/o productive call and nasal congestion x 5 days.  Marland Kitchen  History of Present Illness: [place] Chronic cough.   May 28, 2010:  At last viisit Apr 09, 2010 I Initiated multi-modal Rx against sinus (netti pot, nasonex and finish 1 month doxy given to her by Dr Ninetta Lights), possible GERD (zegerid, diet) , asthma based on normalization of spirometries beween 2009 in [place] and Dec 2011 at [place] while on pred Rx (symbicort and accolate), and cyclical cough (neurontin) I also referred her to Dr. Maple Hudson for allergey evaluation. She has been compliant with above and today she states that with above cough resolved. She is in tears about her improvement. However, last week picked up a cold and having sinus congestion, runny nose and some mild cough related to cold. She still feels that her staph infiecton from 2 years ago is persistent in her sinuses becuae drainage is green though she has declined culture testing. Her allergy blood panel done few days ago is negative. No other complaints although she has questions about long term side efects of each of her medications.  She also wants to know if she can skip skinallergy testing if Dr Maple Hudson now feels pretest prob for allergy is low.   Preventive Screening-Counseling & Management  Alcohol-Tobacco     Alcohol drinks/day: 0     Smoking Status: quit     Packs/Day: 0.5     Year Started: 1975     Year Quit: 2007     Pack years: 18  Current Medications (verified): 1)  Nasonex 50 Mcg/act Susp (Mometasone Furoate) .... One Spray Daily 2)  Metformin Hcl 750 Mg Xr24h-Tab (Metformin Hcl) .... Take 1 Tablet By Mouth Two Times A Day 3)  Aspir-Low 81 Mg Tbec (Aspirin) .... Take  1 Tablet By Mouth Once A Day 4)  Symbicort 160-4.5 Mcg/act  Aero (Budesonide-Formoterol Fumarate) .... Two Puffs Twice Daily.rinse Mouth After Use 5)  Zegerid 20-1100 Mg Caps (Omeprazole-Sodium Bicarbonate) .... Take 1 By Mouth Once Daily 6)  Gabapentin 300 Mg Caps (Gabapentin) .... Take 2 By Mouth Once Daily 7)  Fish Oil 1200 Mg Caps (Omega-3 Fatty Acids) .... Take 2 By Mouth Once Daily 8)  Zafirlukast 20 Mg Tabs (Zafirlukast) .... Take 1 Tablet By Mouth Once A Day 9)  Proair Hfa 108 (90 Base) Mcg/act Aers (Albuterol Sulfate) .... 2 Puffs Every 4 Hours As Needed  Allergies (verified): 1)  ! Ibuprofen  Past History:  Past medical, surgical, family and social histories (including risk factors) reviewed, and no changes noted (except as noted below).  Past Medical History: Reviewed history from 03/18/2010 and no changes required. 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 Knee replacement-both Sinus surgery 02-2009 Appendectomy Total Abdominal Hysterectomy  Family History: Reviewed history from 05/16/2010 and no changes required. bronchitis- mom and sister Mother- alive Shon Millet- died aneurysm  Social History: Reviewed history from 04/07/2010 and no changes required. Alcohol use-no Patient states former smoker.  Started in 1975, quit in 2007  1/2ppd.  Married Homemaker [place]  Review of Systems       The patient complains  of productive cough, nasal congestion/difficulty breathing through nose, and change in color of mucus.  The patient denies shortness of breath with activity, shortness of breath at rest, non-productive cough, coughing up blood, chest pain, irregular heartbeats, acid heartburn, indigestion, loss of appetite, weight change, abdominal pain, difficulty swallowing, sore throat, tooth/dental problems, headaches, sneezing, itching, ear ache, anxiety, depression,  hand/feet swelling, joint stiffness or pain, rash, and fever.    Vital Signs:  Patient profile:   65 year old female Menstrual status:  postmenopausal Height:      64 inches Weight:      231.25 pounds BMI:     39.84 O2 Sat:      94 % on Room air Temp:     98.4 degrees F oral Pulse rate:   115 / minute BP sitting:   130 / 72  (right arm) Cuff size:   large  Vitals Entered By: Carron Curie CMA (May 28, 2010 10:51 AM)  O2 Flow:  Room air CC: Pt here for follow-up. Pt states she has been doing well. Pt c/o productive call and nasal congestion x 5 days.   Comments Medications reviewed with patient Carron Curie CMA  May 28, 2010 10:51 AM Daytime phone number verified with patient.    Physical Exam  General:  well developed, well nourished, in no acute distress. Not coughing Head:  normocephalic and atraumatic Eyes:  PERRLA/EOM intact; conjunctiva and sclera clear Ears:  TMs intact and clear with normal canals Nose:  no deformity, discharge, inflammation, or lesions Mouth:  no deformity or lesions Neck:  no masses, thyromegaly, or abnormal cervical nodes Chest Wall:  no deformities noted Lungs:  clear bilaterally to auscultation and percussion Heart:  regular rate and rhythm, S1, S2 without murmurs, rubs, gallops, or clicks Abdomen:  bowel sounds positive; abdomen soft and non-tender without masses, or organomegaly Msk:  no deformity or scoliosis noted with normal posture Pulses:  pulses normal Extremities:  no clubbing, cyanosis, edema, or deformity noted Neurologic:  CN II-XII grossly intact with normal reflexes, coordination, muscle strength and tone Skin:  intact without lesions or rashes Cervical Nodes:  no significant adenopathy Axillary Nodes:  no significant adenopathy Psych:  alert and cooperative; normal mood and affect; normal attention span and concentration   MISC. Report  Procedure date:  05/16/2010  Findings:      serum allergy test -  normal  Impression & Recommendations:  Problem # 1:  COUGH (ICD-786.2) Assessment Improved  #ASTHMA  - continue symbicort  - continue accolate #SINUS DRAINAGe  - continue netti pot - make sure you use bottle or distilled warm water and not tap water  - use nasonex  - take doxycycline 100mg  by mouth two times a day x 7 days for acute sinusitis  - if sinus issues persists, you might need more blood test to rule out wegners or churg strauss vasculitis (she prefers to wait on these) #ACID REFLLUX   - follow diet sheet - take it again  - due to cost issues finish zegerid and then switch to ranitidine 300mg  by mouth at bedtime  - ok to use fish oil for now (she prefers to continue fish oil for hyperlipidemai on account of intolerance to statins) #CYLICAL COUGH  - continue neurontin 300mg  by mouth two times a day (explained how it acts in cyclical cough, gave literature evidence) - aim to taper neurontin after few months #POSIBLE ALLERGY  - I will check with Dr Maple Hudson if skin test is  needed  Orders: Est. Patient Level IV (14782)  Problem # 2:  SINUSITIS, ACUTE (ICD-461.9) Assessment: New  Apears to be having another episode.   PLAN start doxycycline for 1 weeks  Her updated medication list for this problem includes:    Nasonex 50 Mcg/act Susp (Mometasone furoate) ..... One spray daily    Doxycycline Monohydrate 100 Mg Caps (Doxycycline monohydrate) ..... By mouth twice daily after meals  Orders: Est. Patient Level IV (95621)  Medications Added to Medication List This Visit: 1)  Metformin Hcl 750 Mg Xr24h-tab (Metformin hcl) .... Take 1 tablet by mouth two times a day 2)  Zafirlukast 20 Mg Tabs (Zafirlukast) .... Take 1 tablet by mouth once a day 3)  Proair Hfa 108 (90 Base) Mcg/act Aers (Albuterol sulfate) .... 2 puffs every 4 hours as needed 4)  Doxycycline Monohydrate 100 Mg Caps (Doxycycline monohydrate) .... By mouth twice daily after meals  Patient Instructions: 1)   #ASTHMA 2)   - continue symbicort 3)   - continue accolate 4)  #SINUS DRAINAGe 5)   - continue netti pot - make sure you use bottle or distilled warm water and not tap water 6)   - use nasonex 7)   - take doxycycline 100mg  by mouth two times a day x 7 days 8)   - if sinus issues persists, you might need more blood test 9)  #ACID REFLLUX 10)    - follow diet sheet - take it again 11)   - finish zegerid and then switch to ranitidine 300mg  by mouth at bedtime 12)   - ok to use fish oil for now 13)  #CYLICAL COUGH 14)   - continue neurontin 300mg  by mouth two times a day 15)  #ALLERGIEs 16)   - I will check with Dr.  Maple Hudson if you need skin test 17)  #FOLLOWUP 18)   - 3 months or sooner if needed 19)  - Prescriptions: DOXYCYCLINE MONOHYDRATE 100 MG  CAPS (DOXYCYCLINE MONOHYDRATE) By mouth twice daily after meals  #14 x 0   Entered and Authorized by:   Kalman Shan MD   Signed by:   Kalman Shan MD on 05/28/2010   Method used:   Electronically to        [facility] [id number] (retail)       [address] [place], [place]  [address]       Ph: [phone]       Fax: [phone]   RxID:   [id number]    Immunization History:  Influenza Immunization History:    Influenza:  historical (04/21/2010)   Appended Document: rov ///kp [name]  Due to $ issues she is wondering if she can forego skin allergy testing. She feels if pre-test prob is low so far based on blood test and clinical eval she wants to forego skin allergy testing for the time being  Your thoughts ?  Thanks MR  Appended Document: rov ///kp Certainly- If you and she are satisfied that her needs are met, then absolutely fine for her to cancel the planned return to me for allergy skin testing. I can see her again in the future if you need.   Appended Document: rov ///kp spoke tp patient husband and passed on hDr. Young's infor. he wil pass it onto wife. Most likely they  will cancel

## 2010-06-05 NOTE — Letter (Signed)
SummaryScience writer Pulmonary Care Appointment Letter  [facility] Pulmonary  [address]   [place], [place] [address]   Phone: [phone]  Fax: [phone]    04/14/2010 MRN: [id number]  LITA FLYNN [address] [place], [place]  [address]  Dear Ms. Koehne,   Our office is attempting to contact you about an appointment.  We have scheduled an allergy consult w/ Dr. Maple Hudson for Fri. Jan 13/2012 at 3 PM. Please call our office if you need to reschedule this. [phone].  Our registration staff is prepared to assist you with any questions you may have.    Thank you,   Nature conservation officer Pulmonary Division

## 2010-06-05 NOTE — Assessment & Plan Note (Signed)
Summary: ALLERGIES ///kp   Vital Signs:  Patient profile:   65 year old female Menstrual status:  postmenopausal Height:      64 inches Weight:      233 pounds BMI:     40.14 O2 Sat:      95 % on Room air Pulse rate:   89 / minute BP sitting:   140 / 90  (left arm) Cuff size:   regular  Vitals Entered By: Reynaldo Minium CMA (May 16, 2010 3:31 PM)  O2 Flow:  Room air CC: Allergy consult-Dr. Marchelle Gearing   Copy to:  Dr Pleas Koch [facility] Primary Provider/Referring Provider:  Dr. Sharlot Gowda - PMd, Dr Enedina Finner - ID, Dr. Allegra Grana - ENT, Dr. Marchelle Gearing - Pulmonary  CC:  Allergy consult-Dr. Marchelle Gearing.  History of Present Illness: May 16, 2010- Allergy consult-Dr. Marchelle Gearing. His note reviewed.  65 yoF noted onset of nasal congestion and wheezing 2 years ago. Initally worked with ENT Dr Pollyann Kennedy. Sinus surgery 2010. Hospitalized and dx'd w/ asthma during pollen season in [place] 2 years ago and most recently here in November, 2011. Saw Dr Ninetta Lights for Infectious Disease with sinusitis and treated prednisone and antibiotic with dx of MSSA sinusitis. Repeated courses of antibiotics and prednisone.  Ibuprofen makes chest tight. Statins cause itching and rash. Skin sensitive to soaps and cosmetics. Uses a cleaning lady, no pets. House gets musty when clsed up. Feather beds.  Quit smoking 2007. Moved from [place] 40 years ago, living here x 30 years.  Asthma History    Initial Asthma Severity Rating:    Age range: 12+ years    Symptoms: 0-2 days/week    Nighttime Awakenings: 0-2/month    Interferes w/ normal activity: no limitations    SABA use (not for EIB): 0-2 days/week    Asthma Severity Assessment: Intermittent   Preventive Screening-Counseling & Management  Alcohol-Tobacco     Alcohol drinks/day: 0     Smoking Status: quit     Packs/Day: 0.5     Year Started: 1975     Year Quit: 2007     Pack years: 1  Current Medications (verified): 1)  Nasonex 50  Mcg/act Susp (Mometasone Furoate) .... One Spray Daily 2)  Symbicort 160-4.5 Mcg/act Aero (Budesonide-Formoterol Fumarate) 3)  Metformin Hcl 500 Mg Tabs (Metformin Hcl) .... 750mg  Two Times A Day 4)  Aspir-Low 81 Mg Tbec (Aspirin) .... Take 1 Tablet By Mouth Once A Day 5)  Prednisone 20 Mg Tabs (Prednisone) .... 60mg  Per Day X 10 Days 6)  Symbicort 160-4.5 Mcg/act  Aero (Budesonide-Formoterol Fumarate) .... Two Puffs Twice Daily.rinse Mouth After Use 7)  Zegerid 20-1100 Mg Caps (Omeprazole-Sodium Bicarbonate) .... Take 1 By Mouth Once Daily 8)  Gabapentin 300 Mg Caps (Gabapentin) .... Take 2 By Mouth Once Daily 9)  Fish Oil 1200 Mg Caps (Omega-3 Fatty Acids) .... Take 2 By Mouth Once Daily  Allergies (verified): 1)  ! 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  Started in 1975, quit in 2007  1/2ppd.  Married Homemaker [place]  Risk Factors: Alcohol Use: 0 (05/16/2010)  Risk Factors: Smoking Status: quit (05/16/2010) Packs/Day: 0.5 (05/16/2010)  Family History: bronchitis- mom and sister Mother- alive Shon Millet- died aneurysm  Review of Systems      See HPI       The patient complains of productive cough, non-productive cough, headaches, and hand/feet swelling.  The patient denies shortness of breath with activity, shortness of breath at rest, coughing up blood, chest pain, irregular heartbeats, acid heartburn, indigestion, loss of appetite, weight change, abdominal pain, difficulty swallowing, sore throat, tooth/dental problems, nasal  congestion/difficulty breathing through nose, sneezing, itching, ear ache, anxiety, depression, joint stiffness or pain, rash, change in color of mucus, and fever.    Physical Exam  Additional Exam:  General: A/Ox3; pleasant and cooperative, NAD, SKIN: no rash, lesions NODES: no lymphadenopathy HEENT: [place]/AT, EOM- WNL, Conjuctivae- clear, PERRLA, TM-Scarring and perf Right, Nose- clear, Throat- clear and wnl, Mallampati  II NECK: Supple w/ fair ROM, JVD- none, normal carotid impulses w/o bruits Thyroid- normal to palpation, no stridor CHEST: coarse rhonchi R>L base,  HEART: RRR, no m/g/r heard, no JVD ABDOMEN: Soft and nl; nml bowel sounds; no organomegaly or masses noted ZOX:WRUE, nl pulses, no edema, cyanosis or clubbing NEURO: Grossly intact to observation      Impression & Recommendations:  Problem # 1:  SINUSITIS, CHRONIC (ICD-473.9)  We discussed infection as opposed to allergy and how to evaluate for an immune process.  Sensitive to motrin, but no hx of polyps She agrees to asses her IgE system with in vitro and skin testing.  Consider possibility of a vaculitis/ Wegener's or etc  Orders: T-Allergy Profile Region II-[place], [place], [place], [place], [place] [phone])  Problem # 2:  BRONCHITIS, RECURRENT (ICD-491.9) Question if this is a chronic obstructive / fixed asthma. She smoked 30 years and may mostly have tobacco related disease. She asks why Dr Marchelle Gearing ordered Accolate, then it was reported as "blocked" fromthis office by her drug store.   Medications Added to Medication List This Visit: 1)  Zegerid 20-1100 Mg Caps (Omeprazole-sodium bicarbonate) .... Take 1 by mouth once daily 2)  Gabapentin 300 Mg Caps (Gabapentin) .... Take 2 by mouth once daily 3)  Fish Oil 1200 Mg Caps (Omega-3 fatty acids) .... Take 2 by mouth once daily  Other Orders: Consultation Level III (98119)  Patient Instructions: 1)  Schedule return for allergy skin testing. Stop all antihistamines 3 days before skin  testing, including cold and allergy meds, otc sleep and cough meds.  2)  Lab 3)  We will help understand why Dr Marchelle Gearing had suggested accolate 4)  cc Dr Marchelle Gearing   Orders Added: 1)  Consultation Level III [14782] 2)  T-Allergy Profile Region [place], [place], [place], [place], [place] [[id number]]

## 2010-06-05 NOTE — Progress Notes (Signed)
Summary: having problems breathing   Phone Note Call from Patient   Summary of Call: Pt called c/o breathing is worse.  She is having sob and does not feel the Atbx is working.  Pt is requesting appt. and states she is able to wait until later this evening for appt.  Tomasita Morrow RN  March 31, 2010 9:58 AM

## 2010-06-06 NOTE — Miscellaneous (Signed)
Summary: HIPAA Restrictions  HIPAA Restrictions   Imported By: Florinda Marker 03/19/2010 16:00:55  _____________________________________________________________________  External Attachment:    Type:   Image     Comment:   External Document

## 2010-06-18 ENCOUNTER — Institutional Professional Consult (permissible substitution) (INDEPENDENT_AMBULATORY_CARE_PROVIDER_SITE_OTHER): Payer: 59 | Admitting: Internal Medicine

## 2010-06-18 ENCOUNTER — Encounter: Payer: Self-pay | Admitting: Internal Medicine

## 2010-06-18 DIAGNOSIS — R059 Cough, unspecified: Secondary | ICD-10-CM

## 2010-06-18 DIAGNOSIS — R05 Cough: Secondary | ICD-10-CM

## 2010-06-18 DIAGNOSIS — J45909 Unspecified asthma, uncomplicated: Secondary | ICD-10-CM

## 2010-06-18 DIAGNOSIS — J42 Unspecified chronic bronchitis: Secondary | ICD-10-CM

## 2010-06-18 DIAGNOSIS — J329 Chronic sinusitis, unspecified: Secondary | ICD-10-CM

## 2010-06-25 NOTE — Assessment & Plan Note (Addendum)
Summary: ALLERGY SKIN TEST/kp   Vital Signs:  Patient profile:   65 year old female Menstrual status:  postmenopausal Height:      64 inches Weight:      236 pounds BMI:     40.66 O2 Sat:      93 % on Room air Pulse rate:   100 / minute BP sitting:   112 / 66  (left arm) Cuff size:   large  Vitals Entered By: Reynaldo Minium CMA (June 18, 2010 10:39 AM)  O2 Flow:  Room air CC: skin testing   Copy to:  Dr Pleas Koch [facility] Primary Provider/Referring Provider:  Dr. Sharlot Gowda - PMd, Dr Enedina Finner - ID, Dr. Allegra Grana - ENT, Dr. Marchelle Gearing - Pulmonary  CC:  skin testing.  History of Present Illness: May 28, 2010: Dr Jane Canary note-  At last viisit Apr 09, 2010 I Initiated multi-modal Rx against sinus (netti pot, nasonex and finish 1 month doxy given to her by Dr Ninetta Lights), possible GERD (zegerid, diet) , asthma based on normalization of spirometries beween 2009 in [place] and Dec 2011 at [place] while on pred Rx (symbicort and accolate), and cyclical cough (neurontin) I also referred her to Dr. Maple Hudson for allergey evaluation. She has been compliant with above and today she states that with above cough resolved. She is in tears about her improvement. However, last week picked up a cold and having sinus congestion, runny nose and some mild cough related to cold. She still feels that her staph infiecton from 2 years ago is persistent in her sinuses becuae drainage is green though she has declined culture testing. Her allergy blood panel done few days ago is negative. No other complaints although she has questions about long term side efects of each of her medications.  She also wants to know if she can skip skinallergy testing if Dr Maple Hudson now feels pretest prob for allergy is low.   June 18, 2010- Asthma, Rhinitis, Cyclical cough, GERD (Dr Marchelle Gearing) Nurse-CC: skin testing She had responded to saline rinse and prolonged doxycycline as noted at last ov with Dr  Marchelle Gearing. Allergy profile 05/16/10- IgE 41.9, no specific elevations. She came to go forward with skin testing, but says she is feeling well now.  Skin test- Mild reactions to [place] grass and mixed tree pollens and to house dust mites.      Preventive Screening-Counseling & Management  Alcohol-Tobacco     Alcohol drinks/day: 0     Smoking Status: quit     Packs/Day: 0.5     Year Started: 1975     Year Quit: 2007     Pack years: 80  Current Medications (verified): 1)  Nasonex 50 Mcg/act Susp (Mometasone Furoate) .... One Spray Daily 2)  Metformin Hcl 750 Mg Xr24h-Tab (Metformin Hcl) .... Take 1 Tablet By Mouth Two Times A Day 3)  Aspir-Low 81 Mg Tbec (Aspirin) .... Take 1 Tablet By Mouth Once A Day 4)  Symbicort 160-4.5 Mcg/act  Aero (Budesonide-Formoterol Fumarate) .... Two Puffs Twice Daily.rinse Mouth After Use 5)  Zegerid 20-1100 Mg Caps (Omeprazole-Sodium Bicarbonate) .... Take 1 By Mouth Once Daily 6)  Gabapentin 300 Mg Caps (Gabapentin) .... Take 2 By Mouth Once Daily 7)  Fish Oil 1200 Mg Caps (Omega-3 Fatty Acids) .... Take 2 By Mouth Once Daily 8)  Zafirlukast 20 Mg Tabs (Zafirlukast) .... Take 1 Tablet By Mouth Once A Day 9)  Proair Hfa 108 (90 Base) Mcg/act Aers (Albuterol Sulfate) .Marland KitchenMarland KitchenMarland Kitchen  2 Puffs Every 4 Hours As Needed  Allergies (verified): 1)  ! 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  Started in 1975, quit in 2007  1/2ppd.  Married Homemaker [place]  Risk Factors: Alcohol Use: 0 (06/18/2010)  Risk Factors: Smoking Status: quit  (06/18/2010) Packs/Day: 0.5 (06/18/2010)  Review of Systems      See HPI  The patient denies shortness of breath with activity, shortness of breath at rest, productive cough, non-productive cough, coughing up blood, chest pain, irregular heartbeats, acid heartburn, indigestion, loss of appetite, weight change, abdominal pain, difficulty swallowing, sore throat, tooth/dental problems, headaches, nasal congestion/difficulty breathing through nose, and sneezing.    Physical Exam  Additional Exam:  General: A/Ox3; pleasant and cooperative, NAD, SKIN: no rash, lesions NODES: no lymphadenopathy HEENT: [place]/AT, EOM- WNL, Conjuctivae- clear, PERRLA, TM-Scarring and perf Right, Nose- clear, Throat- clear and wnl, Mallampati  II NECK: Supple w/ fair ROM, JVD- none, normal carotid impulses w/o bruits Thyroid- normal to palpation, no stridor CHEST: clearer than last viist- no cough or wheeze  HEART: RRR, no m/g/r heard, no JVD ABDOMEN: Soft and nl; nml bowel sounds; no organomegaly or masses noted NUU:VOZD, nl pulses, no edema, cyanosis or clubbing NEURO: Grossly intact to observation      Impression & Recommendations:  Problem # 1:  SINUSITIS, CHRONIC (ICD-473.9) There is little active IgE mediated. reaginic allergy process evident now. I discussed environmental precautions for dust etc. I will be happy to see her again if needed.   Problem # 2:  ASTHMA, UNSPECIFIED, UNSPECIFIED STATUS (ICD-493.90) Improved for now. Dr Marchelle Gearing will follow .  Other Orders: Est. Patient Level III (66440) Allergy Puncture Test (34742) Allergy I.D Test (59563)  Patient Instructions: 1)  Please schedule a follow-up appointment as needed. 2)  Allergy seems at most to be a mild aggravating factor for your breathing discomfort. If other treatments fail, then we can look at whether an allergy treatment such as allergy shots would be of enough value to talk about, but for now I would not recommend it .  3)  To  f/u with Dr Marchelle Gearing   Orders Added: 1)  Est. Patient Level III [87564] 2)  Allergy Puncture Test [95004] 3)  Allergy I.D Test [33295]

## 2010-07-01 NOTE — Miscellaneous (Signed)
Summary: Skin test/[facility]  Skin test/[facility]   Imported By: Sherian Rein 06/25/2010 11:57:31  _____________________________________________________________________  External Attachment:    Type:   Image     Comment:   External Document

## 2010-07-09 ENCOUNTER — Encounter: Payer: Self-pay | Admitting: *Deleted

## 2010-07-16 LAB — HEMOGLOBIN A1C
Hgb A1c MFr Bld: 6.7 % — ABNORMAL HIGH (ref <5.7)
Mean Plasma Glucose: 146 mg/dL — ABNORMAL HIGH (ref <117)

## 2010-07-16 LAB — CARDIAC PANEL(CRET KIN+CKTOT+MB+TROPI)
CK, MB: 1.8 ng/mL (ref 0.3–4.0)
CK, MB: 1.8 ng/mL (ref 0.3–4.0)
CK, MB: 2 ng/mL (ref 0.3–4.0)
Relative Index: 1.4 (ref 0.0–2.5)
Relative Index: 1.4 (ref 0.0–2.5)
Relative Index: 1.6 (ref 0.0–2.5)
Total CK: 116 U/L (ref 7–177)
Total CK: 130 U/L (ref 7–177)
Total CK: 144 U/L (ref 7–177)
Troponin I: 0.01 ng/mL (ref 0.00–0.06)
Troponin I: 0.01 ng/mL (ref 0.00–0.06)
Troponin I: 0.01 ng/mL (ref 0.00–0.06)

## 2010-07-16 LAB — URINE CULTURE
Colony Count: 9000
Culture  Setup Time: 201111290128

## 2010-07-16 LAB — HEPATIC FUNCTION PANEL
ALT: 15 U/L (ref 0–35)
AST: 19 U/L (ref 0–37)
Albumin: 3.7 g/dL (ref 3.5–5.2)
Alkaline Phosphatase: 52 U/L (ref 39–117)
Bilirubin, Direct: 0.1 mg/dL (ref 0.0–0.3)
Indirect Bilirubin: 0.6 mg/dL (ref 0.3–0.9)
Total Bilirubin: 0.7 mg/dL (ref 0.3–1.2)
Total Protein: 7.4 g/dL (ref 6.0–8.3)

## 2010-07-16 LAB — COMPREHENSIVE METABOLIC PANEL
ALT: 14 U/L (ref 0–35)
AST: 18 U/L (ref 0–37)
Albumin: 3.3 g/dL — ABNORMAL LOW (ref 3.5–5.2)
Alkaline Phosphatase: 46 U/L (ref 39–117)
BUN: 13 mg/dL (ref 6–23)
CO2: 24 mEq/L (ref 19–32)
Calcium: 8.9 mg/dL (ref 8.4–10.5)
Chloride: 106 mEq/L (ref 96–112)
Creatinine, Ser: 0.59 mg/dL (ref 0.4–1.2)
GFR calc Af Amer: >60 mL/min (ref >60)
GFR calc non Af Amer: >60 mL/min (ref >60)
Glucose, Bld: 171 mg/dL — ABNORMAL HIGH (ref 70–99)
Potassium: 4.3 mEq/L (ref 3.5–5.1)
Sodium: 139 mEq/L (ref 135–145)
Total Bilirubin: 0.5 mg/dL (ref 0.3–1.2)
Total Protein: 6.5 g/dL (ref 6.0–8.3)

## 2010-07-16 LAB — MAGNESIUM: Magnesium: 1.8 mg/dL (ref 1.5–2.5)

## 2010-07-16 LAB — CULTURE, BLOOD (ROUTINE X 2)
Culture  Setup Time: 201111290233
Culture  Setup Time: 201111290233
Culture: NO GROWTH
Culture: NO GROWTH

## 2010-07-16 LAB — URINE MICROSCOPIC-ADD ON: Urine-Other: NONE SEEN

## 2010-07-16 LAB — D-DIMER, QUANTITATIVE: D-Dimer, Quant: 0.37 ug/mL-FEU (ref 0.00–0.48)

## 2010-07-16 LAB — POCT I-STAT, CHEM 8
BUN: 16 mg/dL (ref 6–23)
Calcium, Ion: 1.07 mmol/L — ABNORMAL LOW (ref 1.12–1.32)
Chloride: 102 mEq/L (ref 96–112)
Creatinine, Ser: 0.7 mg/dL (ref 0.4–1.2)
Glucose, Bld: 106 mg/dL — ABNORMAL HIGH (ref 70–99)
HCT: 48 % — ABNORMAL HIGH (ref 36.0–46.0)
Hemoglobin: 16.3 g/dL — ABNORMAL HIGH (ref 12.0–15.0)
Potassium: 3.9 mEq/L (ref 3.5–5.1)
Sodium: 138 mEq/L (ref 135–145)
TCO2: 27 mmol/L (ref 0–100)

## 2010-07-16 LAB — LIPID PANEL
Cholesterol: 242 mg/dL — ABNORMAL HIGH (ref 0–200)
HDL: 34 mg/dL — ABNORMAL LOW (ref >39)
LDL Cholesterol: 137 mg/dL — ABNORMAL HIGH (ref 0–99)
Total CHOL/HDL Ratio: 7.1 RATIO
Triglycerides: 353 mg/dL — ABNORMAL HIGH (ref <150)
VLDL: 71 mg/dL — ABNORMAL HIGH (ref 0–40)

## 2010-07-16 LAB — GLUCOSE, CAPILLARY
Glucose-Capillary: 118 mg/dL — ABNORMAL HIGH (ref 70–99)
Glucose-Capillary: 131 mg/dL — ABNORMAL HIGH (ref 70–99)
Glucose-Capillary: 133 mg/dL — ABNORMAL HIGH (ref 70–99)
Glucose-Capillary: 202 mg/dL — ABNORMAL HIGH (ref 70–99)
Glucose-Capillary: 213 mg/dL — ABNORMAL HIGH (ref 70–99)
Glucose-Capillary: 302 mg/dL — ABNORMAL HIGH (ref 70–99)

## 2010-07-16 LAB — DIFFERENTIAL
Basophils Absolute: 0 10*3/uL (ref 0.0–0.1)
Basophils Relative: 0 % (ref 0–1)
Eosinophils Absolute: 1 10*3/uL — ABNORMAL HIGH (ref 0.0–0.7)
Eosinophils Relative: 11 % — ABNORMAL HIGH (ref 0–5)
Lymphocytes Relative: 36 % (ref 12–46)
Lymphs Abs: 3.3 10*3/uL (ref 0.7–4.0)
Monocytes Absolute: 0.4 10*3/uL (ref 0.1–1.0)
Monocytes Relative: 4 % (ref 3–12)
Neutro Abs: 4.5 10*3/uL (ref 1.7–7.7)
Neutrophils Relative %: 49 % (ref 43–77)

## 2010-07-16 LAB — EXPECTORATED SPUTUM ASSESSMENT W GRAM STAIN, RFLX TO RESP C

## 2010-07-16 LAB — CBC
HCT: 38.4 % (ref 36.0–46.0)
HCT: 43.3 % (ref 36.0–46.0)
Hemoglobin: 13.5 g/dL (ref 12.0–15.0)
Hemoglobin: 15 g/dL (ref 12.0–15.0)
MCH: 29.4 pg (ref 26.0–34.0)
MCH: 29.6 pg (ref 26.0–34.0)
MCHC: 34.7 g/dL (ref 30.0–36.0)
MCHC: 34.9 g/dL (ref 30.0–36.0)
MCV: 84.7 fL (ref 78.0–100.0)
MCV: 84.8 fL (ref 78.0–100.0)
Platelets: 266 10*3/uL (ref 150–400)
Platelets: 303 10*3/uL (ref 150–400)
RBC: 4.54 MIL/uL (ref 3.87–5.11)
RBC: 5.11 MIL/uL (ref 3.87–5.11)
RDW: 13.6 % (ref 11.5–15.5)
RDW: 13.8 % (ref 11.5–15.5)
WBC: 6.8 10*3/uL (ref 4.0–10.5)
WBC: 9.2 10*3/uL (ref 4.0–10.5)

## 2010-07-16 LAB — URINALYSIS, ROUTINE W REFLEX MICROSCOPIC
Bilirubin Urine: NEGATIVE
Glucose, UA: NEGATIVE mg/dL
Hgb urine dipstick: NEGATIVE
Ketones, ur: NEGATIVE mg/dL
Leukocytes, UA: NEGATIVE
Nitrite: NEGATIVE
Protein, ur: 100 mg/dL — AB
Specific Gravity, Urine: 1.022 (ref 1.005–1.030)
Urobilinogen, UA: 0.2 mg/dL (ref 0.0–1.0)
pH: 5.5 (ref 5.0–8.0)

## 2010-07-16 LAB — CULTURE, RESPIRATORY W GRAM STAIN: Culture: NORMAL

## 2010-07-16 LAB — IGE: IgE (Immunoglobulin E), Serum: 72.5 IU/mL (ref 0.0–180.0)

## 2010-07-16 LAB — TSH: TSH: 0.29 u[IU]/mL — ABNORMAL LOW (ref 0.350–4.500)

## 2010-07-16 LAB — CLOSTRIDIUM DIFFICILE BY PCR: Toxigenic C. Difficile by PCR: NOT DETECTED

## 2010-08-15 ENCOUNTER — Other Ambulatory Visit: Payer: Self-pay | Admitting: Internal Medicine

## 2010-09-16 NOTE — Discharge Summary (Signed)
[name], [name]               ACCOUNT NO.:  [id number]   MEDICAL RECORD NO.:  [id number]          PATIENT TYPE:  INP   LOCATION:  5024                         FACILITY:  [facility]   PHYSICIAN:  Jennifer Basque. Dalldorf, M.D.DATE OF BIRTH:  [birth year]   DATE OF ADMISSION:  06/14/2007  DATE OF DISCHARGE:  06/17/2007                               DISCHARGE SUMMARY   ADMISSION DIAGNOSES:  1. Left knee end-stage degenerative joint disease.  2. Diabetes mellitus.  3. History of right total knee replacement and hypercholesterolemia.   DISCHARGE DIAGNOSES:  1. Left knee end-stage degenerative joint disease.  2. Diabetes mellitus.  3. History of right total knee replacement and hypercholesterolemia.   OPERATION:  Left total knee replacement.   BRIEF HISTORY:  [name] is a 65 year old white female patient well  known to our practice with increasing left knee pain.  On x-ray, she has  end-stage DJD and has been having pain with every step.  She has failed  viscosupplementation injections, oral anti-inflammatory medicines, and  corticosteroid injections.  We have discussed with her total knee  replacement, as described, with the risk of anesthesia, infection, DVT,  and possible death.   PERTINENT LABORATORY/X-RAY FINDINGS:  WBC 7.8, hemoglobin 15.1,  hematocrit 43.4, platelets 293.  Sodium 137, potassium 4.1, BUN 17,  creatinine 0.81, calcium 9.8.   Normal sinus rhythm on EKG.   COURSE IN THE HOSPITAL:  She was admitted postoperatively and was placed  on a variety of p.o. and IM analgesics.  A PCA Dilaudid pump was used.  She was also on a glycemic control order sheet for non-pregnant adults.  Coumadin and Lovenox protocol for DVT prophylaxis, per pharmacy.  IV  Ancef 1 gm q.8h. x3 doses.  Ferrous sulfate and another oral medication  such as antispasmodics, antiemetics.  CPM machine 0-50 degrees and  advanced as tolerated, 10 degrees daily.  Knee high TEDs.  Incentive  spirometry.   Her  first day postop, her vital signs were stable.  Blood pressure  114/71, temperature 99, hemoglobin 15.1, glucose 165, INR 1.1.  Breath  sounds in all lung fields.  Abdomen was soft.  The knee was without  signs of infection or irritation.  The Foley catheter was discontinued  that day.   The second day postop, her wound was checked.  Her wound was benign.  Dressing was changed.  Hemovac was pulled.  No signs of infection or  irritation.  Lack of calf tenderness.   Third day postop, she continued to work with physical therapy.  Weightbearing as tolerated.  Was discharged home.   CONDITION ON DISCHARGE:  She will be on the following medicines:  1. Simvastatin 40 mg daily.  2. Tricor 145 mg daily.  3. Metformin 850 mg 1/2 b.i.d.  4. Omega 3 daily.  5. Pharmacy regulation of Coumadin, which now is 5 mg a day.  6. Percocet 1-2 q.4-6h. p.r.n. pain.   She can be on a low sodium, heart-healthy diet, change her dressing  daily, weightbearing as tolerated.  [facility] will come to her  house for INR checks and  physical therapy.  She will return to Dr.  Nolon Nations office in 10 days.  Any sign of infection before then, she  will call the office number, [phone], to make an appointment.      [name], P.A.      [name], M.D.  Electronically Signed    MC/MEDQ  D:  06/17/2007  T:  06/18/2007  Job:  [id number]

## 2010-09-16 NOTE — Op Note (Signed)
[name], [name]               ACCOUNT NO.:  [id number]   MEDICAL RECORD NO.:  [id number]          PATIENT TYPE:  INP   LOCATION:  2899                         FACILITY:  [facility]   PHYSICIAN:  Lubertha Basque. Dalldorf, M.D.DATE OF BIRTH:  05-28-1945   DATE OF PROCEDURE:  06/14/2007  DATE OF DISCHARGE:                               OPERATIVE REPORT   PREOPERATIVE DIAGNOSIS:  Left knee degenerative joint disease.   POSTOPERATIVE DIAGNOSIS:  Left knee degenerative joint disease.   PROCEDURE:  Left total knee replacement.   ANESTHESIA:  General and block.   ATTENDING SURGEON:  Lubertha Basque. Jerl Santos, M.D.   ASSISTANT:  Lindwood Qua, P.A.   INDICATIONS FOR PROCEDURE:  The patient is a 65 year old woman with a  long history of bilateral arthritis of the knees.  She is status post a  successful replacement 2 years ago on the opposite side.  She has pain  on the left which is limiting to her in terms of being able to walk and  rest.  On x-ray she has advanced degenerative change.  She has failed  conservative measures of oral anti-inflammatories and injectables.  She  is offered a knee replacement operation.  Informed operative consent was  obtained discussion of the possible complications of reaction to  anesthesia, infection, DVT, PE, and death.   SUMMARY OF FINDINGS AND PROCEDURE:  Under general anesthesia and a block  through a longitudinal anterior incision, a left knee replacement was  performed.  She had advanced degenerative change, medial and  patellofemoral, and excellent bone quality.  She had a moderate varus  deformity, which we corrected and used components similar to the  opposite side.  We used DePuy LCS knee replacement parts using a  standard femur, 3 tibia, 15-mm deep-dish spacer, and a 35-mm all  polyethylene patella.  She had a staple in the knee, which we left in  place as it was not contacting the components or interfering with the  cutting guides.  [name]  assisted throughout and was invaluable to  the completion of the case in that he helped position and retract and  closed silmotaneously to help minimize OR time.   DISCRIPTION OF PROCEDURE:  The patient was taken to the OR suite where  GA was applied without difficulty.  She was also given a block in the  preanesthesia area.  She was prepped and draped in normal sterile  fashion.  All appropriate anti-infective measures were used including  preop IV antibiotics, betadine drape, hoods for surgical team.  A  standard approach was taken to the knee after exanguination and  inflation of tourniquet.  A medial parapatellar extensor incision was  made and the knee exposed.  We corrected her mild deformity with  appropriate cuts and minimal soft tissue releases.  An extramedullary  guide was placed at the tibia to make the cut followed by intramedullary  guides in the femur to make appropriate cuts there balancing the knee.  Sizing blocks and cuts were made.  The patella was cut down in thickness  by 9mm and sized to a 35 with  the appropriate guide used here as well.  A trial reduction was done and she easily came to slight hyperextension  and flexed well.  The trials were removed followed by irrigation of the  bones.  Cement was mixed including Zinacef.  The aforemnetioned parts  were cemented in place.  Excess cement was trimmed and pressure was held  on the components until the cement hardened.  Tourniquet was deflated  and a small amount of bleeding was easily controlled.  A drain was  placed and the extensor mechanism closed with number one vicryl.  SubQ  was closed with Vicryl and skin with staples.  A sterile dressing was  applied.  EBL and IOF can be obtained from anesthesia records.   DISPOSITION:  The patient was extubated in the OR and taken to PACU in  stable condition.  Plans were for her to be admitted to the ortho  service for management to include periop antibiotics and Coumadin  plus  Lovenox for DVT prophylaxis.      Lubertha Basque Jerl Santos, M.D.  Electronically Signed     PGD/MEDQ  D:  06/14/2007  T:  06/15/2007  Job:  [id number]

## 2010-09-19 NOTE — Op Note (Signed)
[name], [name]               ACCOUNT NO.:  [id number]   MEDICAL RECORD NO.:  [id number]          PATIENT TYPE:  INP   LOCATION:  5015                         FACILITY:  [facility]   PHYSICIAN:  Lubertha Basque. Dalldorf, M.D.DATE OF BIRTH:  [birth year]   DATE OF PROCEDURE:  05/26/2005  DATE OF DISCHARGE:                                 OPERATIVE REPORT   PREOPERATIVE DIAGNOSIS:  Right knee degenerative arthritis.   POSTOPERATIVE DIAGNOSIS:  Right knee degenerative arthritis.   PROCEDURE:  Right total knee replacement.   SURGEON:  Lubertha Basque. Jerl Santos, M.D.   ASSISTANT:  Lindwood Qua, P.A.   ANESTHESIA:  General.   INDICATIONS FOR PROCEDURE:  The patient is a 65 year old woman with long  history of right knee pain.  This has persisted despite oral anti-  inflammatories and multiple injectables.  She has had an arthroscopy in the  past year which revealed end-stage degeneration medial compartment. She has  pain at this point which limits her ability to remain active and for ability  to rest. She is offered a knee replacement procedure.  Informed operative  consent was obtained discussion of possible complications of, reaction to  anesthesia, infection, DVT, PE, and death.   DESCRIPTION OF PROCEDURE:  The patient was taken to the operating suite  where general anesthetic was applied without difficulty.  I believe she was  also given a block in the preanesthesia area.  She was positioned supine and  prepped and draped in normal sterile fashion.  After administration of  preoperative IV Kefzol, the right leg was elevated, exsanguinated, and a  tourniquet inflated about the thigh.  A longitudinal anterior incision was  made with dissection down to the extensor mechanism.  All appropriate anti-  infective measures were used including the preoperative IV antibiotic,  Betadine impregnated drape, and closed hooded exhaust systems for each  member of the surgical team.  A medial parapatellar  incision was made. The  kneecap was flipped and the knee flexed.  Some residual meniscal tissues  were removed along with the ACL and PCL.  A slight medial release was done  off the tibia to correct a mild varus deformity.  She had end-stage an  degeneration medial compartment with moderate change in the patellofemoral  joint.  She had excellent bone quality.  An extramedullary guide was placed  on the tibia in order to create a cut on the surface with slight posterior  tilt.  An intramedullary guide was then placed in the femur to make the  anterior posterior cuts creating a flexion gap of 15 mm.  A second  intramedullary guide was placed on the femur in order to make a distal cut,  creating an equal extension gap of 15 mm.  The knee was easily balanced.  The femur sized to a standard component while the tibia was sized to 2.5 and  appropriate guides were placed and utilized.  The patella was cut down a  thickness from 23-14 mm and sized to a 32 with the appropriate guide placed  and utilized.  A trial reduction was done with all  the aforementioned  components.  The kneecap tracked well and no lateral release was required.  The knee easily came to slight hyperextension and flexed well.  Trial  components were removed followed by pulsatile lavage and irrigation of all  three cut bony surfaces.  Cement was mixed including antibiotic and was  pressurized onto all three bones followed by placement of the DePuy LCS  components which again were standard femur, 2.5 tibia, 15 mm deep dish  spacer, and 32 mm all polyethylene patella.  Excess cement was trimmed and  pressure was held on the components until cement had hardened.  The  tourniquet was deflated and a  small amount of bleeding was easily  controlled with Bovie cautery.  The knee was irrigated followed by  reapproximation of deep tissues with #1 Vicryl in an interrupted fashion.  A  drain was placed exiting superolaterally prior to the  extensor mechanism  closure. Subcutaneous tissues were reapproximated in two layers with O and 2-  0 undyed Vicryl followed by skin closure with staples.  The knee flexed up  to 125 degrees against gravity at the end of the case.  Adaptic was applied  to the wound followed by dry gauze and a loose Ace wrap.  Estimated blood  loss and intraoperative fluids as well as accurate tourniquet time can be  obtained from anesthesia records.   DISPOSITION:  The patient was extubated in operating room and taken to the  recovery room in stable addition.  Plans were for her to be admitted to the  orthopedic surgery service for appropriate postoperative care to include  perioperative antibiotics and Lovenox for  DVT prophylaxis.      Lubertha Basque Jerl Santos, M.D.  Electronically Signed     PGD/MEDQ  D:  05/26/2005  T:  05/27/2005  Job:  [id number]

## 2010-09-19 NOTE — Op Note (Signed)
NAMEANAM, BOBBY               ACCOUNT NO.:  [id number]   MEDICAL RECORD NO.:  [id number]          PATIENT TYPE:  AMB   LOCATION:  DSC                          FACILITY:  [facility]   PHYSICIAN:  Lubertha Basque. Dalldorf, M.D.DATE OF BIRTH:  [birth year]   DATE OF PROCEDURE:  10/07/2004  DATE OF DISCHARGE:                                 OPERATIVE REPORT   PREOPERATIVE DIAGNOSIS:  Right knee torn medial meniscus.   POSTOPERATIVE DIAGNOSES:  1. Right knee torn medial meniscus.  2. Right knee degenerative joint disease.   PROCEDURES:  1. Right knee partial meniscectomy.  2. Right knee abrasion chondroplasty.   ANESTHESIA:  General and block.   ATTENDING SURGEON:  Lubertha Basque. Jerl Santos, M.D.   ASSISTANT:  Lindwood Qua, P.A.   INDICATIONS FOR PROCEDURE:  The patient is a 65 year old woman with about 6  weeks of intense right knee pain which started on vacation in [place]. This  has persisted despite an injection and oral anti-inflammatories. She had an  ultrasound study done in [place] which showed a torn meniscus. She persists  with pain at rest and pain with activities and is offered an arthroscopy.  Informed operative consent was obtained after discussion of possible  complications of reaction to anesthesia and infection.   DESCRIPTION OF PROCEDURE:  The patient is taken to the operating suite where  general anesthetic is applied without  difficulty.  She is also given a  block in the preanesthesia area. She was positioned supine and prepped and  draped in normal sterile fashion. After administration of preoperative IV  antibiotics, arthroscopy of the right knee was performed through two  inferior portals. Suprapatellar pouch was benign while the patellofemoral  joint exhibited some grade II changes in the intertrochlear groove and some  small areas of grade III change. A brief chondroplasty was required here. In  the medial compartment she did have a posterior horn medial meniscus tear  addressed about 10% partial medial meniscectomy. She also had some broad  grade III and fairly broad grade IV changes. She had one strip of grade IV  change on the medial femoral condyle on the articular portion near the far  medial margin of the compartment. An abrasion to bleeding bone was  performed. In the lateral compartment she had no significant degenerative  change and the meniscus was intact. The ACL appeared to be intact. The knee  was thoroughly irrigated at the end of the case, followed by placement  Marcaine with epinephrine and morphine and some Depo-Medrol. Adaptic was  placed over the portals, followed by dry gauze and loose Ace wrap. Estimated  blood loss and intraoperative fluids can be obtained from anesthesia  records.   DISPOSITION:  The patient was extubated in the operating room taken to the  recovery room in stable addition. Plans were for her to go home the same day  and follow up in the office in less than a week.  I will contact by phone  tonight.       PGD/MEDQ  D:  10/07/2004  T:  10/07/2004  Job:  [id number]

## 2010-09-19 NOTE — Procedures (Signed)
[place]. [facility]  Patient:    [name], [name]                      MRN: [id number] Proc. Date: 12/01/00 Adm. Date:  [id number] Attending:  Charna Elizabeth CC:         Heather Roberts, M.D.   Procedure Report  DATE OF BIRTH:  [birth year]  REFERRING PHYSICIAN:  Heather Roberts, M.D.  PROCEDURE PERFORMED:  Colonoscopy with biopsies.  ENDOSCOPIST:  Anselmo Rod, M.D.  INSTRUMENT USED:  Olympus pediatric colonoscope.  INDICATIONS FOR PROCEDURE:  The patient is a 65 year old white female with a history of blood in stool and hemorrhoids.  The patient denies a family history of colon cancer.  The patient has had history of change in bowel habits with ____________ in the recent past.  She has been to [place] in the last few weeks but her diarrhea has been persistent for several years now, worse in the recent past.  PREPROCEDURE PREPARATION:  Informed consent was procured from the patient. The patient was fasted for eight hours prior to the procedure and prepped with a bottle of magnesium citrate and a gallon of NuLytely the night prior to the procedure.  PREPROCEDURE PHYSICAL:  The patient had stable vital signs.  Neck supple. Chest clear to auscultation.  S1, S2 regular.  Abdomen obese with several surgical scars present, nondistended, nontender with normal bowel sounds.  DESCRIPTION OF PROCEDURE:  The patient was placed in the left lateral decubitus position and sedated with 100 mg of Demerol and 10 mg of Versed intravenously.  Once the patient was adequately sedated and maintained on low-flow oxygen and continuous cardiac monitoring, the Olympus video colonoscope was advanced from the rectum to the cecum without difficulty. The patient had prominent external hemorrhoids and small nonbleeding internal hemorrhoids.  There was early left-sided diverticulosis appreciated.  No masses or polyps were seen.  There was an erythematous fold at the base  of the cecum that was biopsied for pathology.  The rest of the colonic mucosa appeared healthy.  IMPRESSION: 1. Small nonbleeding internal and external hemorrhoids. 2. Early left-sided diverticulosis. 3. Small erythematous fold at the base of the cecum biopsied for pathology.  RECOMMENDATIONS: 1. I will start the patient on Bentyl 20 mg b.i.d. to help with her diarrhea    and monitor her response to this medication. 2. A high fiber diet has been advised along with liberal fluid intake. 3. Outpatient follow-up to discuss biopsy results and make further    recommendations. DD:  12/01/00 TD:  12/01/00 Job: [id number]

## 2010-09-19 NOTE — Discharge Summary (Signed)
NAMERHEALYNN, [name]               ACCOUNT NO.:  [id number]   MEDICAL RECORD NO.:  [id number]          PATIENT TYPE:  INP   LOCATION:  5015                         FACILITY:  [facility]   PHYSICIAN:  Lubertha Basque. Dalldorf, M.D.DATE OF BIRTH:  1946-01-19   DATE OF ADMISSION:  05/26/2005  DATE OF DISCHARGE:  05/29/2005                                 DISCHARGE SUMMARY   ADMISSION DIAGNOSES:  1.  Right knee end-stage degenerative joint disease.  2.  Hypercholesterolemia.   DISCHARGE DIAGNOSES:  1.  Right knee end-stage degenerative joint disease.  2.  Hypercholesterolemia.   OPERATION:  Right total knee replacement.   BRIEF HISTORY:  [name] is a 65 year old white female appearing [name]  stated age.  Patient well-known to our practice.  We have been treating [name]  for right knee pain over an extended period of time.  [name] had a knee  arthroscopy done in June 2006, which showed some grade IV degenerative  changes.  We have subsequently tried corticosteroid injections and Synvisc  injections to no avail. [name] is now having pain with every step, trouble  sleeping at nighttime and swelling.  Discussed treatment options with [name],  that being total knee replacement with the risks of anesthesia, infection,  DVT and possible death.   PERTINENT LABORATORY AND X-RAY FINDINGS:  [name] EKG showed normal sinus  rhythm.   The wbc were 7.7, rbc 3.59, hemoglobin 10.8, hematocrit 30.5, platelets 229.  Sodium 135, potassium 3.2, glucose 148, BUN 11, creatinine 0.8, calcium 8.2.   HOSPITAL COURSE:  [name] was taken to the OR as above for [name] TKR.  Postoperatively [name] was put on a PCA morphine pump at full adult dose,  lactated Ringer's IV at 80 mL an hour, diet advanced as tolerated.  IV Ancef  1 g q.8h. x3 doses, Lovenox precaution that would go on for 21 days per  pharmacy protocol for DVT prophylaxis, iron sulfate 325 once a day,  laxatives, stool softeners, antiemetics, p.o. pain medicine-Percocet,  Foley  catheter as needed to be discontinued in the first one to two days.  [name] was  also on a CPM machine 0 to 50 eight hours a day and then advance by 10  degrees daily.  Knee-high TEDs, incentive spirometry q.1h. while awake to  prevent DVT.  PT consult and OT consult.  [name] was also weightbearing as  tolerated.  We had also written an order for a Lovenox teaching kit upon [name]  discharge.  [name] medicine reconciliation sheet was checked and signed.  [name]  was on Tricor one a day, looked like 145 mg; Vytorin 10/40 one a day, Ambien  10 mg at bedtime as needed.   The first day postoperatively [name] blood pressure was 126/71, heart rate 99,  temperature 100.9, hemoglobin 12.1.  Foley catheter was in place.  [name] knee  was comfortable.  Dressing was daily.  No sign of infection or irritation.  On that day, we would discontinue [name] Foley catheter, [name] PCA morphine pump  and we were going to keep [name] on Lovenox 40 mg subcu once a day  for 21 days  for DVT prophylaxis.  [facility] was the agency that was designated  to help [name] with therapy and dressing changes at home if necessary.   The second day postoperatively, blood pressure was 140/87, heart rate 100,  temperature 98, hemoglobin 11.4, potassium 3.3.  Lungs were clear.  Abdomen  was soft.  Dressing changed and drain was pulled and [name] wound was benign  with no sign of infection.  [name] continued to progress well and was  discharged home.   CONDITION ON DISCHARGE:  Improved.   FOLLOW UP:  [facility] will come to [name] house for dressing changes  and physical therapy.   [name] is given a prescription for Lovenox 40 mg subcu once a day for 20 days.  Percocet prescription given one or two every four to six hours for pain as  needed.   May change [name] dressing daily.  May shower, get [name] incision wet.   DIET:  Unrestricted.   May walk with crutches or walker and be weightbearing as tolerated.  Return  to Dr. Nolon Nations office in 10  to 12 days.  Call [phone] for an appointment  or if there is any sign of infection which would be increasing redness, pain  or drainage.  Once again, weightbearing as tolerated.      Lindwood Qua, P.A.      Lubertha Basque Jerl Santos, M.D.  Electronically Signed    MC/MEDQ  D:  07/07/2005  T:  07/08/2005  Job:  [id number]

## 2010-10-10 ENCOUNTER — Encounter: Payer: Self-pay | Admitting: Family Medicine

## 2010-10-14 ENCOUNTER — Encounter: Payer: Self-pay | Admitting: Family Medicine

## 2010-10-14 ENCOUNTER — Ambulatory Visit (INDEPENDENT_AMBULATORY_CARE_PROVIDER_SITE_OTHER): Payer: Self-pay | Admitting: Family Medicine

## 2010-10-14 DIAGNOSIS — E119 Type 2 diabetes mellitus without complications: Secondary | ICD-10-CM

## 2010-10-14 DIAGNOSIS — R6889 Other general symptoms and signs: Secondary | ICD-10-CM

## 2010-10-14 DIAGNOSIS — R7989 Other specified abnormal findings of blood chemistry: Secondary | ICD-10-CM

## 2010-10-14 DIAGNOSIS — E785 Hyperlipidemia, unspecified: Secondary | ICD-10-CM

## 2010-10-14 LAB — LIPID PANEL
Cholesterol: 306 mg/dL — ABNORMAL HIGH (ref 0–200)
HDL: 38 mg/dL — ABNORMAL LOW (ref >39)
Total CHOL/HDL Ratio: 8.1 Ratio
Triglycerides: 872 mg/dL — ABNORMAL HIGH (ref <150)

## 2010-10-14 LAB — POCT GLYCOSYLATED HEMOGLOBIN (HGB A1C): Hemoglobin A1C: 7

## 2010-10-14 MED ORDER — LOSARTAN POTASSIUM 25 MG PO TABS: 25.0000 mg | ORAL_TABLET | Freq: Every day | ORAL | Status: DC

## 2010-10-14 MED ORDER — METFORMIN HCL ER 750 MG PO TB24: 750.0000 mg | ORAL_TABLET | Freq: Two times a day (BID) | ORAL | Status: DC

## 2010-10-14 NOTE — Patient Instructions (Signed)
Try Tylenol for aches and pains

## 2010-10-14 NOTE — Progress Notes (Signed)
  Subjective:    Patient ID: [name], female    DOB: Sep 29, 1945, 65 y.o.   MRN: [id number]  HPI She is here for a followup. She was admitted to the hospital in November. The hospital admission exam was reviewed. She was eventually sent to pulmonary and placed on medications for treatment of underlying COPD. She continues on her diabetes medications. He did express concern over not getting to pulmonary earlier. I explained to her that her symptoms seem to be mainly ENT related. She voiced understanding of this. She states she is feeling much better. She was placed on Neurontin which is apparently to help with coughing. She did show a report indicating the use of this medicine for cough. She would like several of her medications renewed. She has a history of statin intolerance. Also while in the hospital she had a TSH which was relatively low and needs followup. She is having no symptoms suggestive of hyperthyroid.   Review of Systems     Objective:   Physical Exam: alert and in no distress otherwise not examined        Assessment & Plan:  COPD. Diabetes. Obesity. I will check a TSH on her as well as do lipid panel. Discussed placing her on a fiberate which she has been on in the past. I will discuss this with her after I get the blood report back.

## 2010-10-15 ENCOUNTER — Telehealth: Payer: Self-pay | Admitting: *Deleted

## 2010-10-15 LAB — TSH: TSH: 0.743 u[IU]/mL (ref 0.350–4.500)

## 2010-10-15 NOTE — Telephone Encounter (Addendum)
Message copied by Dorthula Perfect on Wed Oct 15, 2010  4:27 PM ------      Message from: Ronnald Nian      Created: Wed Oct 15, 2010  3:10 PM       Her lipid panel is elevated. She needs to be placed back on by mouth fibroid which she has been on in the past. Discussed this with her and let her know of any questions.    Pt notified of lab results and has agreed to go back on fibroid medication.  Please sent rx to pharmacy.  CM, LPN

## 2010-10-16 ENCOUNTER — Ambulatory Visit (INDEPENDENT_AMBULATORY_CARE_PROVIDER_SITE_OTHER): Payer: Self-pay | Admitting: Family Medicine

## 2010-10-16 ENCOUNTER — Telehealth: Payer: Self-pay | Admitting: Family Medicine

## 2010-10-16 ENCOUNTER — Encounter: Payer: Self-pay | Admitting: Family Medicine

## 2010-10-16 ENCOUNTER — Other Ambulatory Visit: Payer: Self-pay | Admitting: *Deleted

## 2010-10-16 DIAGNOSIS — E785 Hyperlipidemia, unspecified: Secondary | ICD-10-CM

## 2010-10-16 DIAGNOSIS — E782 Mixed hyperlipidemia: Secondary | ICD-10-CM

## 2010-10-16 MED ORDER — FENOFIBRATE 145 MG PO TABS: 145.0000 mg | ORAL_TABLET | Freq: Every day | ORAL | Status: DC

## 2010-10-16 NOTE — Progress Notes (Signed)
  Subjective:    Patient ID: [name], female    DOB: 11/13/1945, 65 y.o.   MRN: [id number]  HPI She is here for recheck. Her blood studies did come back showing elevated cholesterol and greatly elevated triglycerides. She has a previous history of statin intolerance. We also tried Questran however it was too expensive her to want to buy. She has been on fenofibrate in the past. The record is not clear as to why she stopped this.   Review of Systems     Objective:   Physical Exam Alert and in no distress otherwise not examined       Assessment & Plan:  Hyperlipidemia. I will place her on fenofibrate. We discussed possible side effects. I will recheck her in approximately 4 months.

## 2010-10-16 NOTE — Patient Instructions (Signed)
Start on the medication and if having trouble give me a call ;otherwise I'll see you in 4 months

## 2010-10-21 NOTE — Telephone Encounter (Signed)
DONE

## 2011-01-23 LAB — BASIC METABOLIC PANEL
BUN: 10
BUN: 17
BUN: 9
BUN: 9
CO2: 24
CO2: 25
CO2: 26
CO2: 26
Calcium: 8.1 — ABNORMAL LOW
Calcium: 8.3 — ABNORMAL LOW
Calcium: 8.4
Calcium: 9.8
Chloride: 102
Chloride: 102
Chloride: 103
Chloride: 99
Creatinine, Ser: 0.71
Creatinine, Ser: 0.73
Creatinine, Ser: 0.76
Creatinine, Ser: 0.81
GFR calc Af Amer: >60
GFR calc Af Amer: >60
GFR calc Af Amer: >60
GFR calc Af Amer: >60
GFR calc non Af Amer: >60
GFR calc non Af Amer: >60
GFR calc non Af Amer: >60
GFR calc non Af Amer: >60
Glucose, Bld: 138 — ABNORMAL HIGH
Glucose, Bld: 151 — ABNORMAL HIGH
Glucose, Bld: 164 — ABNORMAL HIGH
Glucose, Bld: 170 — ABNORMAL HIGH
Potassium: 3.5
Potassium: 3.6
Potassium: 3.7
Potassium: 4.1
Sodium: 134 — ABNORMAL LOW
Sodium: 134 — ABNORMAL LOW
Sodium: 136
Sodium: 137

## 2011-01-23 LAB — CBC
HCT: 35 — ABNORMAL LOW
HCT: 36.1
HCT: 36.2
HCT: 43.4
Hemoglobin: 12
Hemoglobin: 12.5
Hemoglobin: 12.5
Hemoglobin: 15.1 — ABNORMAL HIGH
MCHC: 34.4
MCHC: 34.5
MCHC: 34.7
MCHC: 34.8
MCV: 87.4
MCV: 87.5
MCV: 87.7
MCV: 87.9
Platelets: 211
Platelets: 214
Platelets: 224
Platelets: 293
RBC: 4
RBC: 4.11
RBC: 4.12
RBC: 4.97
RDW: 12.8
RDW: 12.9
RDW: 12.9
RDW: 13
WBC: 7.6
WBC: 7.8
WBC: 8.3
WBC: 8.4

## 2011-01-23 LAB — HEMOGLOBIN A1C
Hgb A1c MFr Bld: 7.6 — ABNORMAL HIGH
Mean Plasma Glucose: 193

## 2011-01-23 LAB — PROTIME-INR
INR: 1.1
INR: 1.1
INR: 1.6 — ABNORMAL HIGH
INR: 1.7 — ABNORMAL HIGH
Prothrombin Time: 14.2
Prothrombin Time: 14.9
Prothrombin Time: 19.3 — ABNORMAL HIGH
Prothrombin Time: 20.1 — ABNORMAL HIGH

## 2011-02-10 ENCOUNTER — Telehealth: Payer: Self-pay | Admitting: Family Medicine

## 2011-02-10 ENCOUNTER — Other Ambulatory Visit: Payer: Self-pay

## 2011-02-10 DIAGNOSIS — Z79899 Other long term (current) drug therapy: Secondary | ICD-10-CM

## 2011-02-10 LAB — HEPATIC FUNCTION PANEL
ALT: 43 U/L — ABNORMAL HIGH (ref 0–35)
AST: 50 U/L — ABNORMAL HIGH (ref 0–37)
Albumin: 4.2 g/dL (ref 3.5–5.2)
Alkaline Phosphatase: 35 U/L — ABNORMAL LOW (ref 39–117)
Bilirubin, Direct: 0.1 mg/dL (ref 0.0–0.3)
Indirect Bilirubin: 0.4 mg/dL (ref 0.0–0.9)
Total Bilirubin: 0.5 mg/dL (ref 0.3–1.2)
Total Protein: 7.5 g/dL (ref 6.0–8.3)

## 2011-02-10 NOTE — Telephone Encounter (Signed)
Make sure that her cholesterol is being checked

## 2011-02-10 NOTE — Telephone Encounter (Signed)
Gave to jo 

## 2011-02-11 LAB — LIPID PANEL
Cholesterol: 208 mg/dL — ABNORMAL HIGH (ref 0–200)
HDL: 37 mg/dL — ABNORMAL LOW (ref >39)
LDL Cholesterol: 110 mg/dL — ABNORMAL HIGH (ref 0–99)
Total CHOL/HDL Ratio: 5.6 Ratio
Triglycerides: 304 mg/dL — ABNORMAL HIGH (ref <150)
VLDL: 61 mg/dL — ABNORMAL HIGH (ref 0–40)

## 2011-02-24 ENCOUNTER — Telehealth: Payer: Self-pay | Admitting: Family Medicine

## 2011-02-24 ENCOUNTER — Ambulatory Visit (INDEPENDENT_AMBULATORY_CARE_PROVIDER_SITE_OTHER): Payer: Self-pay | Admitting: Family Medicine

## 2011-02-24 ENCOUNTER — Encounter: Payer: Self-pay | Admitting: Family Medicine

## 2011-02-24 DIAGNOSIS — E119 Type 2 diabetes mellitus without complications: Secondary | ICD-10-CM

## 2011-02-24 DIAGNOSIS — E785 Hyperlipidemia, unspecified: Secondary | ICD-10-CM

## 2011-02-24 NOTE — Telephone Encounter (Signed)
Phoned pt lmtrc 

## 2011-02-24 NOTE — Progress Notes (Signed)
  Subjective:    Patient ID: [name], female    DOB: 03-30-1946, 65 y.o.   MRN: [id number]  HPI She is here for a recheck and to go over her blood work. She continues to do quite well. She does plan on another trip back to [place] to spend time with her mother who is now 44.   Review of Systems     Objective:   Physical Exam Alert and in no distress otherwise not examined       Assessment & Plan:   1. DIABETES MELLITUS, TYPE II   2. HYPERLIPIDEMIA    statin intolerance She will continue on her present medication regimen as well as dietary restrictions. Recheck here in about 4 months. Curvature to continue with your present dietary modification.

## 2011-02-24 NOTE — Telephone Encounter (Signed)
She can come back early for a hemoglobin A1c and lipid panel.

## 2011-03-19 ENCOUNTER — Other Ambulatory Visit: Payer: Self-pay | Admitting: Family Medicine

## 2011-05-31 ENCOUNTER — Other Ambulatory Visit (HOSPITAL_COMMUNITY): Payer: No Typology Code available for payment source

## 2011-05-31 ENCOUNTER — Encounter (HOSPITAL_COMMUNITY): Payer: Self-pay | Admitting: Emergency Medicine

## 2011-05-31 ENCOUNTER — Emergency Department (HOSPITAL_COMMUNITY): Payer: No Typology Code available for payment source

## 2011-05-31 ENCOUNTER — Emergency Department (HOSPITAL_COMMUNITY)
Admission: EM | Admit: 2011-05-31 | Discharge: 2011-05-31 | Disposition: A | Discharge status: Home / Self Care | Payer: No Typology Code available for payment source | Attending: Emergency Medicine | Admitting: Emergency Medicine

## 2011-05-31 DIAGNOSIS — M25529 Pain in unspecified elbow: Secondary | ICD-10-CM | POA: Insufficient documentation

## 2011-05-31 DIAGNOSIS — S4990XA Unspecified injury of shoulder and upper arm, unspecified arm, initial encounter: Secondary | ICD-10-CM

## 2011-05-31 DIAGNOSIS — S46909A Unspecified injury of unspecified muscle, fascia and tendon at shoulder and upper arm level, unspecified arm, initial encounter: Secondary | ICD-10-CM | POA: Insufficient documentation

## 2011-05-31 DIAGNOSIS — Y9229 Other specified public building as the place of occurrence of the external cause: Secondary | ICD-10-CM | POA: Insufficient documentation

## 2011-05-31 DIAGNOSIS — W010XXA Fall on same level from slipping, tripping and stumbling without subsequent striking against object, initial encounter: Secondary | ICD-10-CM | POA: Insufficient documentation

## 2011-05-31 DIAGNOSIS — S4980XA Other specified injuries of shoulder and upper arm, unspecified arm, initial encounter: Secondary | ICD-10-CM | POA: Insufficient documentation

## 2011-05-31 NOTE — ED Notes (Signed)
Pt presented to the ER with c/o right arm pain, elbow, secondary to fall, pt states she was at the movie [place] when she fell and landed in between two seats, pt states at that time "i heard it crack", 8/10

## 2011-05-31 NOTE — ED Provider Notes (Signed)
History     CSN: [id number]  Arrival date & time 05/31/11  1952   First MD Initiated Contact with Patient 05/31/11 2011      Chief Complaint  Patient presents with  . Fall  . Arm Injury  . Arm Pain  . possible Fx     (Consider location/radiation/quality/duration/timing/severity/associated sxs/prior treatment) HPI Comments: Patient reports that while she was at the movie theater she tripped and fell.  Her arm became caught in between two of the seats.  She is now having pain of her right upper arm and right elbow.  She reports that she does not have any previous fractures.  She denies any numbness/tingling.  She is not having any shoulder or wrist pain.  Patient is a 66 y.o. female presenting with fall, arm injury, and arm pain. The history is provided by the patient.  Fall Pertinent negatives include no fever.  Arm Injury   Arm Pain Pertinent negatives include no chills, fever, joint swelling or rash.    Past Medical History  Diagnosis Date  . Obesity   . CVA (cerebral infarction)   . Allergy   . Nasal polyps   . Insomnia   . COPD (chronic obstructive pulmonary disease)     Past Surgical History  Procedure Date  . Replacement total knee   . Abdominal hysterectomy   . Nasal sinus surgery     History reviewed. No pertinent family history.  History  Substance Use Topics  . Smoking status: Current Some Day Smoker  . Smokeless tobacco: Never Used  . Alcohol Use: No    OB History    Grav Para Term Preterm Abortions TAB SAB Ect Mult Living                  Review of Systems  Constitutional: Negative for fever and chills.  Musculoskeletal: Negative for joint swelling and gait problem.  Skin: Negative for color change, rash and wound.    Allergies  Ibuprofen and Lipitor  Home Medications   Current Outpatient Rx  Name Route Sig Dispense Refill  . ALBUTEROL SULFATE HFA 108 (90 BASE) MCG/ACT IN AERS Inhalation Inhale 2 puffs into the lungs every 4 (four)  hours as needed.      . ASPIRIN 81 MG PO TBEC Oral Take 81 mg by mouth daily.      . BUDESONIDE-FORMOTEROL FUMARATE 160-4.5 MCG/ACT IN AERO Inhalation Inhale 2 puffs into the lungs 2 (two) times daily. Rinse mouth after use     . FENOFIBRATE 145 MG PO TABS Oral Take 1 tablet (145 mg total) by mouth daily. 30 tablet prn  . GABAPENTIN 300 MG PO CAPS  1 CAPSULE ONCE A DAY X4 DAYS, 1 CAP TWICE A DAY X4 DAYS THEN 1 CAP 3 TIMES A DAY TO CONTINUE 90 capsule 2  . LOSARTAN POTASSIUM 25 MG PO TABS Oral Take 1 tablet (25 mg total) by mouth daily. 30 tablet 11  . METFORMIN HCL ER 750 MG PO TB24  TAKE 1 TABLET BY MOUTH TWICE A DAY 60 tablet 3  . MOMETASONE FUROATE 50 MCG/ACT NA SUSP Nasal 1 spray by Nasal route daily.      Marland Kitchen FISH OIL 1200 MG PO CAPS Oral Take 2 capsules by mouth daily.      Marland Kitchen OMEPRAZOLE-SODIUM BICARBONATE 20-1100 MG PO CAPS Oral Take 1 capsule by mouth daily.      Marland Kitchen ZAFIRLUKAST 20 MG PO TABS Oral Take 20 mg by mouth daily.  There were no vitals taken for this visit.  Physical Exam  Nursing note and vitals reviewed. Constitutional: She is oriented to person, place, and time. She appears well-developed and well-nourished. No distress.  HENT:  Head: Normocephalic and atraumatic.  Neck: Normal range of motion. Neck supple.  Cardiovascular: Normal rate, regular rhythm and normal heart sounds.   Pulmonary/Chest: Effort normal and breath sounds normal. No respiratory distress.  Musculoskeletal:       Right shoulder: She exhibits normal range of motion, no tenderness, no bony tenderness, no swelling and no deformity.       Right elbow: She exhibits decreased range of motion. She exhibits no swelling and no deformity. tenderness found. Olecranon process tenderness noted.       Tenderness to palpation of the right humerus. No swelling or ecchymosis.   Radial pulse 2+ bilaterally. Distal sensation intact.  Neurological: She is alert and oriented to person, place, and time.  Skin: Skin  is warm and dry. She is not diaphoretic. No erythema.  Psychiatric: She has a normal mood and affect.    ED Course  Procedures (including critical care time)  Labs Reviewed - No data to display Dg Elbow Complete Right  05/31/2011  *RADIOLOGY REPORT*  Clinical Data: Pain post fall  RIGHT ELBOW - COMPLETE 3+ VIEW  Comparison: None  Findings: Mild osseous demineralization. Joint spaces preserved. No acute fracture, dislocation, or bone destruction. No elbow joint effusion.  IMPRESSION: No acute abnormalities.  Original Report Authenticated By: Lollie Marrow, M.D.   Dg Humerus Right  05/31/2011  *RADIOLOGY REPORT*  Clinical Data: Pain post fall  RIGHT HUMERUS - 2+ VIEW  Comparison: None  Findings: Osseous demineralization. Joint spaces appear normally aligned. No definite fracture, dislocation or bone destruction.  IMPRESSION: No definite acute bony abnormalities.  Original Report Authenticated By: Lollie Marrow, M.D.     No diagnosis found.  Patient reported that she did not want any pain medication while in the ED.  Patient given sling.  MDM  Negative xrays.  Patient neurovascularly intact.  Patient discharge home and instructed to follow up with PCP if needed        Magnus Sinning, PA-C 06/01/11 0229

## 2011-06-01 NOTE — ED Provider Notes (Signed)
Medical screening examination/treatment/procedure(s) were performed by non-physician practitioner and as supervising physician I was immediately available for consultation/collaboration.    Shanna Un L Molleigh Huot, MD 06/01/11 2254 

## 2011-06-12 ENCOUNTER — Encounter: Payer: Self-pay | Admitting: Internal Medicine

## 2011-06-19 ENCOUNTER — Other Ambulatory Visit: Payer: Self-pay

## 2011-06-19 ENCOUNTER — Ambulatory Visit (INDEPENDENT_AMBULATORY_CARE_PROVIDER_SITE_OTHER): Payer: Self-pay | Admitting: Family Medicine

## 2011-06-19 ENCOUNTER — Telehealth: Payer: Self-pay | Admitting: Internal Medicine

## 2011-06-19 ENCOUNTER — Encounter: Payer: Self-pay | Admitting: Family Medicine

## 2011-06-19 VITALS — BP 122/76 | HR 90 | Temp 97.6°F | Ht 64.0 in | Wt 222.0 lb

## 2011-06-19 DIAGNOSIS — E785 Hyperlipidemia, unspecified: Secondary | ICD-10-CM

## 2011-06-19 DIAGNOSIS — Z79899 Other long term (current) drug therapy: Secondary | ICD-10-CM

## 2011-06-19 DIAGNOSIS — J069 Acute upper respiratory infection, unspecified: Secondary | ICD-10-CM

## 2011-06-19 LAB — LIPID PANEL
Cholesterol: 225 mg/dL — ABNORMAL HIGH (ref 0–200)
HDL: 34 mg/dL — ABNORMAL LOW (ref >39)
LDL Cholesterol: 127 mg/dL — ABNORMAL HIGH (ref 0–99)
Total CHOL/HDL Ratio: 6.6 Ratio
Triglycerides: 321 mg/dL — ABNORMAL HIGH (ref <150)
VLDL: 64 mg/dL — ABNORMAL HIGH (ref 0–40)

## 2011-06-19 MED ORDER — DOXYCYCLINE HYCLATE 100 MG PO TABS: 100.0000 mg | ORAL_TABLET | Freq: Two times a day (BID) | ORAL | Status: AC

## 2011-06-19 MED ORDER — AZITHROMYCIN 500 MG PO TABS: 500.0000 mg | ORAL_TABLET | Freq: Every day | ORAL | Status: AC

## 2011-06-19 NOTE — Patient Instructions (Signed)
Take the ProAir.as needed to 4 times per day. Use Gabapentin for the cough. Take all of the antibiotic.Call if no improvement.  Go ahead and use the Alka-Seltzer.

## 2011-06-19 NOTE — Telephone Encounter (Signed)
Patient will be switched to azithromycin due to the cost of doxycycline

## 2011-06-19 NOTE — Progress Notes (Signed)
  Subjective:    Patient ID: [name], female    DOB: 23-May-1945, 66 y.o.   MRN: [id number]  HPI She is here for evaluation of a three-day history of cough, congestion but no fever chills or sore throat. She has a previous history of difficulty with allergies, asthma and coughing. She has been seen by pulmonary for this. Presently she is taking Neurontin for the chronic cough as well as her albuterol inhaler. She was prescribed Symbicort in the past however presently is not on this medication. Her past history is such that when she gets symptoms like this, antibiotics are usually quite helpful. She also needs followup on her lipid panel. She is statin intolerant and presently is on fenofibrate.    Review of Systems     Objective:   Physical Exam alert and in no distress. Tympanic membranes and canals are normal. Throat is clear. Tonsils are normal. Neck is supple without adenopathy or thyromegaly. Cardiac exam shows a regular sinus rhythm without murmurs or gallops. Lungs show inspiratory and expiratory wheezing.        Assessment & Plan:   1. URI (upper respiratory infection)    2. Hyperlipidemia LDL goal <70  Lipid panel  3    asthma  I will place her on doxycycline which has worked in the past. She is to continue on her other medications including the Neurontin.

## 2011-06-25 ENCOUNTER — Ambulatory Visit: Payer: Self-pay | Admitting: Family Medicine

## 2011-07-30 ENCOUNTER — Other Ambulatory Visit: Payer: Self-pay | Admitting: Family Medicine

## 2011-11-30 ENCOUNTER — Other Ambulatory Visit: Payer: Self-pay | Admitting: Family Medicine

## 2011-12-18 ENCOUNTER — Other Ambulatory Visit: Payer: Self-pay | Admitting: Family Medicine

## 2011-12-18 NOTE — Telephone Encounter (Signed)
PATIENT NEEDS TO COME IN FOR A OFFICE VISIT BEFORE THERE WILL BE ANY MORE REFILLS.

## 2012-02-09 ENCOUNTER — Telehealth: Payer: Self-pay | Admitting: Family Medicine

## 2012-02-09 NOTE — Telephone Encounter (Signed)
i think this was to come to you

## 2012-02-09 NOTE — Telephone Encounter (Signed)
This is okay.

## 2012-02-11 ENCOUNTER — Other Ambulatory Visit: Payer: Self-pay

## 2012-02-11 DIAGNOSIS — Z79899 Other long term (current) drug therapy: Secondary | ICD-10-CM

## 2012-02-11 LAB — CBC WITH DIFFERENTIAL/PLATELET
Basophils Absolute: 0.1 10*3/uL (ref 0.0–0.1)
Basophils Relative: 1 % (ref 0–1)
Eosinophils Absolute: 0.4 10*3/uL (ref 0.0–0.7)
Eosinophils Relative: 6 % — ABNORMAL HIGH (ref 0–5)
HCT: 43.4 % (ref 36.0–46.0)
Hemoglobin: 14.4 g/dL (ref 12.0–15.0)
Lymphocytes Relative: 41 % (ref 12–46)
Lymphs Abs: 2.6 10*3/uL (ref 0.7–4.0)
MCH: 29.3 pg (ref 26.0–34.0)
MCHC: 33.2 g/dL (ref 30.0–36.0)
MCV: 88.4 fL (ref 78.0–100.0)
Monocytes Absolute: 0.3 10*3/uL (ref 0.1–1.0)
Monocytes Relative: 4 % (ref 3–12)
Neutro Abs: 3.1 10*3/uL (ref 1.7–7.7)
Neutrophils Relative %: 48 % (ref 43–77)
Platelets: 237 10*3/uL (ref 150–400)
RBC: 4.91 MIL/uL (ref 3.87–5.11)
RDW: 13.8 % (ref 11.5–15.5)
WBC: 6.4 10*3/uL (ref 4.0–10.5)

## 2012-02-12 LAB — COMPREHENSIVE METABOLIC PANEL
ALT: 26 U/L (ref 0–35)
AST: 31 U/L (ref 0–37)
Albumin: 4.2 g/dL (ref 3.5–5.2)
Alkaline Phosphatase: 38 U/L — ABNORMAL LOW (ref 39–117)
BUN: 15 mg/dL (ref 6–23)
CO2: 26 mEq/L (ref 19–32)
Calcium: 9.1 mg/dL (ref 8.4–10.5)
Chloride: 103 mEq/L (ref 96–112)
Creat: 0.74 mg/dL (ref 0.50–1.10)
Glucose, Bld: 131 mg/dL — ABNORMAL HIGH (ref 70–99)
Potassium: 4.3 mEq/L (ref 3.5–5.3)
Sodium: 137 mEq/L (ref 135–145)
Total Bilirubin: 0.6 mg/dL (ref 0.3–1.2)
Total Protein: 7 g/dL (ref 6.0–8.3)

## 2012-02-12 LAB — LIPID PANEL
Cholesterol: 243 mg/dL — ABNORMAL HIGH (ref 0–200)
HDL: 36 mg/dL — ABNORMAL LOW (ref >39)
Total CHOL/HDL Ratio: 6.8 Ratio
Triglycerides: 430 mg/dL — ABNORMAL HIGH (ref <150)

## 2012-02-12 LAB — TSH: TSH: 0.974 u[IU]/mL (ref 0.350–4.500)

## 2012-02-12 LAB — T4: T4, Total: 11 ug/dL (ref 5.0–12.5)

## 2012-02-12 LAB — RPR

## 2012-02-12 LAB — T3 UPTAKE: T3 Uptake: 32 % (ref 22.5–37.0)

## 2012-02-16 ENCOUNTER — Ambulatory Visit (INDEPENDENT_AMBULATORY_CARE_PROVIDER_SITE_OTHER): Payer: Self-pay | Admitting: Family Medicine

## 2012-02-16 ENCOUNTER — Encounter: Payer: Self-pay | Admitting: Family Medicine

## 2012-02-16 DIAGNOSIS — I10 Essential (primary) hypertension: Secondary | ICD-10-CM | POA: Insufficient documentation

## 2012-02-16 DIAGNOSIS — E119 Type 2 diabetes mellitus without complications: Secondary | ICD-10-CM

## 2012-02-16 DIAGNOSIS — E1169 Type 2 diabetes mellitus with other specified complication: Secondary | ICD-10-CM

## 2012-02-16 DIAGNOSIS — E785 Hyperlipidemia, unspecified: Secondary | ICD-10-CM

## 2012-02-16 DIAGNOSIS — Z789 Other specified health status: Secondary | ICD-10-CM

## 2012-02-16 DIAGNOSIS — E1159 Type 2 diabetes mellitus with other circulatory complications: Secondary | ICD-10-CM

## 2012-02-16 MED ORDER — FENOFIBRATE 160 MG PO TABS: 160.0000 mg | ORAL_TABLET | Freq: Every day | ORAL | Status: DC

## 2012-02-16 MED ORDER — FLUTICASONE PROPIONATE 50 MCG/ACT NA SUSP: 2.0000 | Freq: Every day | NASAL | Status: DC

## 2012-02-16 MED ORDER — METFORMIN HCL ER 750 MG PO TB24: 750.0000 mg | ORAL_TABLET | Freq: Two times a day (BID) | ORAL | Status: DC

## 2012-02-16 MED ORDER — LOSARTAN POTASSIUM 25 MG PO TABS: 25.0000 mg | ORAL_TABLET | Freq: Every day | ORAL | Status: DC

## 2012-02-16 NOTE — Progress Notes (Signed)
  Subjective:    Patient ID: [name], female    DOB: 27-Nov-1945, 66 y.o.   MRN: [id number]  HPI She is here for followup visit. She just recently returned from [place]. She does travel there quite frequently. She does have a history of statin intolerance. I in the past I have tried her on WelChol however the cost was more than she wanted to spend. She needs several of her medications refilled. She has no particular concerns or complaints. She does continue to smoke intermittently.   Review of Systems     Objective:   Physical Exam Alert and in no distress. Blood pressure and pulse are recorded. Lab data was reviewed with her.      Assessment & Plan:   1. HYPERLIPIDEMIA   2. DIABETES MELLITUS, TYPE II   3. Hypertension associated with diabetes   4. Statin intolerance    [place] medications were renewed. She will continue on her present medication regimen.

## 2012-02-25 ENCOUNTER — Telehealth: Payer: Self-pay | Admitting: Family Medicine

## 2012-02-25 NOTE — Telephone Encounter (Signed)
I am not comfortable calling in the medicine without seeing her she can see me or go to an urgent care

## 2012-02-25 NOTE — Telephone Encounter (Signed)
Pt advised word for word 

## 2012-07-25 ENCOUNTER — Other Ambulatory Visit: Payer: Self-pay | Admitting: Family Medicine

## 2013-02-06 ENCOUNTER — Other Ambulatory Visit: Payer: Self-pay | Admitting: Family Medicine

## 2013-02-06 DIAGNOSIS — Z1231 Encounter for screening mammogram for malignant neoplasm of breast: Secondary | ICD-10-CM

## 2013-02-20 ENCOUNTER — Encounter: Payer: Self-pay | Admitting: Family Medicine

## 2013-02-20 ENCOUNTER — Ambulatory Visit (INDEPENDENT_AMBULATORY_CARE_PROVIDER_SITE_OTHER): Payer: Medicare Other | Admitting: Family Medicine

## 2013-02-20 VITALS — BP 124/80 | HR 84 | Ht 64.0 in | Wt 222.0 lb

## 2013-02-20 DIAGNOSIS — E119 Type 2 diabetes mellitus without complications: Secondary | ICD-10-CM

## 2013-02-20 DIAGNOSIS — Z789 Other specified health status: Secondary | ICD-10-CM

## 2013-02-20 DIAGNOSIS — Z23 Encounter for immunization: Secondary | ICD-10-CM

## 2013-02-20 DIAGNOSIS — E785 Hyperlipidemia, unspecified: Secondary | ICD-10-CM

## 2013-02-20 DIAGNOSIS — E669 Obesity, unspecified: Secondary | ICD-10-CM

## 2013-02-20 DIAGNOSIS — Z79899 Other long term (current) drug therapy: Secondary | ICD-10-CM

## 2013-02-20 DIAGNOSIS — E1169 Type 2 diabetes mellitus with other specified complication: Secondary | ICD-10-CM

## 2013-02-20 DIAGNOSIS — E1159 Type 2 diabetes mellitus with other circulatory complications: Secondary | ICD-10-CM

## 2013-02-20 DIAGNOSIS — I129 Hypertensive chronic kidney disease with stage 1 through stage 4 chronic kidney disease, or unspecified chronic kidney disease: Secondary | ICD-10-CM

## 2013-02-20 DIAGNOSIS — I10 Essential (primary) hypertension: Secondary | ICD-10-CM

## 2013-02-20 DIAGNOSIS — Z87891 Personal history of nicotine dependence: Secondary | ICD-10-CM

## 2013-02-20 LAB — POCT URINALYSIS DIPSTICK
Bilirubin, UA: NEGATIVE
Blood, UA: NEGATIVE
Glucose, UA: NEGATIVE
Ketones, UA: NEGATIVE
Leukocytes, UA: NEGATIVE
Nitrite, UA: NEGATIVE
Protein, UA: NEGATIVE
Spec Grav, UA: 1.02
Urobilinogen, UA: NEGATIVE
pH, UA: 5

## 2013-02-20 LAB — LIPID PANEL
Cholesterol: 230 mg/dL — ABNORMAL HIGH (ref 0–200)
HDL: 38 mg/dL — ABNORMAL LOW (ref >39)
LDL Cholesterol: 117 mg/dL — ABNORMAL HIGH (ref 0–99)
Total CHOL/HDL Ratio: 6.1 Ratio
Triglycerides: 377 mg/dL — ABNORMAL HIGH (ref <150)
VLDL: 75 mg/dL — ABNORMAL HIGH (ref 0–40)

## 2013-02-20 LAB — COMPREHENSIVE METABOLIC PANEL
ALT: 26 U/L (ref 0–35)
AST: 34 U/L (ref 0–37)
Albumin: 4.5 g/dL (ref 3.5–5.2)
Alkaline Phosphatase: 34 U/L — ABNORMAL LOW (ref 39–117)
BUN: 16 mg/dL (ref 6–23)
CO2: 25 mEq/L (ref 19–32)
Calcium: 9.5 mg/dL (ref 8.4–10.5)
Chloride: 101 mEq/L (ref 96–112)
Creat: 0.74 mg/dL (ref 0.50–1.10)
Glucose, Bld: 144 mg/dL — ABNORMAL HIGH (ref 70–99)
Potassium: 4.4 mEq/L (ref 3.5–5.3)
Sodium: 135 mEq/L (ref 135–145)
Total Bilirubin: 0.4 mg/dL (ref 0.3–1.2)
Total Protein: 7.4 g/dL (ref 6.0–8.3)

## 2013-02-20 LAB — CBC WITH DIFFERENTIAL/PLATELET
Basophils Absolute: 0 10*3/uL (ref 0.0–0.1)
Basophils Relative: 1 % (ref 0–1)
Eosinophils Absolute: 0.6 10*3/uL (ref 0.0–0.7)
Eosinophils Relative: 8 % — ABNORMAL HIGH (ref 0–5)
HCT: 41.9 % (ref 36.0–46.0)
Hemoglobin: 14.5 g/dL (ref 12.0–15.0)
Lymphocytes Relative: 38 % (ref 12–46)
Lymphs Abs: 2.9 10*3/uL (ref 0.7–4.0)
MCH: 29.4 pg (ref 26.0–34.0)
MCHC: 34.6 g/dL (ref 30.0–36.0)
MCV: 85 fL (ref 78.0–100.0)
Monocytes Absolute: 0.4 10*3/uL (ref 0.1–1.0)
Monocytes Relative: 5 % (ref 3–12)
Neutro Abs: 3.6 10*3/uL (ref 1.7–7.7)
Neutrophils Relative %: 48 % (ref 43–77)
Platelets: 279 10*3/uL (ref 150–400)
RBC: 4.93 MIL/uL (ref 3.87–5.11)
RDW: 14.3 % (ref 11.5–15.5)
WBC: 7.5 10*3/uL (ref 4.0–10.5)

## 2013-02-20 LAB — POCT UA - MICROALBUMIN
Albumin/Creatinine Ratio, Urine, POC: 49.9
Creatinine, POC: 74.6 mg/dL
Microalbumin Ur, POC: 66.9 mg/L

## 2013-02-20 LAB — POCT GLYCOSYLATED HEMOGLOBIN (HGB A1C): Hemoglobin A1C: 7.2

## 2013-02-20 MED ORDER — METFORMIN HCL ER 750 MG PO TB24: ORAL_TABLET | ORAL | Status: DC

## 2013-02-20 MED ORDER — FENOFIBRATE 160 MG PO TABS: 160.0000 mg | ORAL_TABLET | Freq: Every day | ORAL | Status: DC

## 2013-02-20 MED ORDER — LOSARTAN POTASSIUM 25 MG PO TABS: ORAL_TABLET | ORAL | Status: DC

## 2013-02-20 NOTE — Progress Notes (Signed)
  Subjective:    Patient ID: [name], female    DOB: 1945/10/09, 67 y.o.   MRN: [id number]  HPI She is here for her initial welcome to Medicare visit. Her medical and social history was reviewed. She also gets some care in [place] where she is originally from and apparently still has sensation should. Her immunizations were reviewed. She has had a hysterectomy. She has not had a colonoscopy recently. She does have an advanced directive. She also has an underlying history of diabetes and has not had this checked in a year. She continues on medications listed in the chart. She is still and heart and I have in the past tried her on various statin drugs without success due to myalgias. She does not check her sugars regularly. She is about to get involved with [employer].   Review of Systems Negative except as above    Objective:   Physical Exam alert and in no distress. Tympanic membranes and canals are normal. Throat is clear. Tonsils are normal. Neck is supple without adenopathy or thyromegaly. Cardiac exam shows a regular sinus rhythm without murmurs or gallops. Lungs are clear to auscultation. Abdominal exam shows a large abdomen without masses or tenderness. Urine microalbumin did show spilling of protein. Hemoglobin A1c is 7.2      Assessment & Plan:  DIABETES MELLITUS, TYPE II - Plan: POCT glycosylated hemoglobin (Hb A1C), POCT Urinalysis Dipstick, POCT UA - Microalbumin, CBC with Differential, Comprehensive metabolic panel, Lipid panel, metFORMIN (GLUCOPHAGE-XR) 750 MG 24 hr tablet  Need for prophylactic vaccination and inoculation against influenza - Plan: Flu vaccine HIGH DOSE PF (Fluzone Tri High dose)  Statin intolerance - Plan: Lipid panel  Need for prophylactic vaccination and inoculation against unspecified single disease - Plan: Pneumococcal conjugate vaccine 13-valent less than 5yo IM, CANCELED: Pneumococcal polysaccharide vaccine 23-valent greater than or equal to  2yo subcutaneous/IM  Obesity (BMI 30-39.9) - Plan: Ambulatory referral to Gastroenterology  Encounter for long-term (current) use of other medications - Plan: Ambulatory referral to Gastroenterology  Hyperlipidemia LDL goal <70 - Plan: fenofibrate 160 MG tablet  Hypertension associated with diabetes - Plan: losartan (COZAAR) 25 MG tablet  TOBACCO USE, QUIT  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 Refer for colonoscopy will be made. Routine immunizations were updated. Recheck here in 4-6 months.

## 2013-02-23 ENCOUNTER — Ambulatory Visit (HOSPITAL_COMMUNITY)
Admission: RE | Admit: 2013-02-23 | Discharge: 2013-02-23 | Disposition: A | Discharge status: Home / Self Care | Payer: Medicare Other | Source: Ambulatory Visit | Attending: Family Medicine | Admitting: Family Medicine

## 2013-02-23 DIAGNOSIS — Z1231 Encounter for screening mammogram for malignant neoplasm of breast: Secondary | ICD-10-CM | POA: Insufficient documentation

## 2013-03-02 LAB — HM MAMMOGRAPHY: HM Mammogram: NORMAL

## 2013-07-14 ENCOUNTER — Encounter: Payer: Self-pay | Admitting: Internal Medicine

## 2013-07-14 LAB — HM COLONOSCOPY

## 2013-07-21 ENCOUNTER — Encounter: Payer: Self-pay | Admitting: Family Medicine

## 2013-08-07 ENCOUNTER — Ambulatory Visit: Payer: Medicare Other | Admitting: Family Medicine

## 2013-08-11 ENCOUNTER — Encounter: Payer: Self-pay | Admitting: Family Medicine

## 2013-08-11 ENCOUNTER — Ambulatory Visit (INDEPENDENT_AMBULATORY_CARE_PROVIDER_SITE_OTHER): Payer: Medicare Other | Admitting: Family Medicine

## 2013-08-11 VITALS — BP 94/68 | HR 80 | Wt 222.0 lb

## 2013-08-11 DIAGNOSIS — Z79899 Other long term (current) drug therapy: Secondary | ICD-10-CM

## 2013-08-11 DIAGNOSIS — E119 Type 2 diabetes mellitus without complications: Secondary | ICD-10-CM

## 2013-08-11 LAB — POCT GLYCOSYLATED HEMOGLOBIN (HGB A1C): Hemoglobin A1C: 7.6

## 2013-08-11 MED ORDER — METFORMIN HCL ER 750 MG PO TB24: ORAL_TABLET | ORAL | Status: DC

## 2013-08-11 MED ORDER — ZOLPIDEM TARTRATE 5 MG PO TABS: 5.0000 mg | ORAL_TABLET | Freq: Every evening | ORAL | Status: DC | PRN

## 2013-08-11 NOTE — Progress Notes (Signed)
   Subjective:    Patient ID: [name], female    DOB: February 16, 1946, 68 y.o.   MRN: [id number]  HPI  [name] is a 68 yo female with PMH significant for diabetes and HTN who presents today for a diabetes follow up.   The patient's blood sugars have ranged between 100-145, however she takes these readings a various times of the day and inconsistently. She has a son with Type I diabetes and so understands the importance of checking her blood sugars more regularly and plans to do so in the future. The patient regularly inspects her feet and has noted no new issues. She has not noted any changes in her vision.   The patient knows that she needs to eat better and states that she both understands the importance of this and has a plan for getting better control of her blood sugars. She plans to cut back on certain foods, but did not wish to discuss the specifics of her diet at this visit. The patient has not yet begun to exercise, despite joining [employer] several months ago, due to her travel to [place], her mother's recent illness and the cold weather. She plan to begin exercising now that the weather is warming up. The patient's smoking status was not reassessed and she does not drink alcohol regularly.   The patient was also unable to receive a shingles vaccine and so requires a new prescription today.   Review of Systems is negative except per HPI.     Objective:   Physical Exam  Constitutional: Patient is oriented to person, place, and time and well-developed, well-nourished, and in no distress. Cardiovascular: Normal rate, regular rhythm and intact distal pulses. Pulmonary/Chest: Effort normal and breath sounds normal.  Extremities: No lesions of the foot Neuro: monofilament exam of feet normal Hemoglobin A1c 7.6 Assessment & Plan:  Type II or unspecified type diabetes mellitus without mention of complication, not stated as uncontrolled - Plan: HgB A1c  DIABETES  MELLITUS, TYPE II - Plan: metFORMIN (GLUCOPHAGE-XR) 750 MG 24 hr tablet  Encounter for long-term (current) use of other medications  The patient wishes to attempt to lower her HgA1c with diet and exercise prior to the addition of another medication. We will plan to follow up in four months to reassess and determine next course of action.

## 2013-08-14 ENCOUNTER — Encounter: Payer: Self-pay | Admitting: Family Medicine

## 2013-08-17 ENCOUNTER — Encounter: Payer: Self-pay | Admitting: Family Medicine

## 2013-08-17 ENCOUNTER — Encounter: Payer: Self-pay | Admitting: Internal Medicine

## 2013-11-22 LAB — HM DIABETES EYE EXAM

## 2013-11-28 ENCOUNTER — Encounter: Payer: Self-pay | Admitting: Internal Medicine

## 2013-12-12 ENCOUNTER — Ambulatory Visit (INDEPENDENT_AMBULATORY_CARE_PROVIDER_SITE_OTHER): Payer: Medicare Other | Admitting: Family Medicine

## 2013-12-12 ENCOUNTER — Encounter: Payer: Self-pay | Admitting: Family Medicine

## 2013-12-12 VITALS — BP 110/80 | HR 83 | Ht 63.5 in | Wt 222.0 lb

## 2013-12-12 DIAGNOSIS — J42 Unspecified chronic bronchitis: Secondary | ICD-10-CM

## 2013-12-12 DIAGNOSIS — E669 Obesity, unspecified: Secondary | ICD-10-CM

## 2013-12-12 DIAGNOSIS — E119 Type 2 diabetes mellitus without complications: Secondary | ICD-10-CM

## 2013-12-12 DIAGNOSIS — E785 Hyperlipidemia, unspecified: Secondary | ICD-10-CM

## 2013-12-12 DIAGNOSIS — E1169 Type 2 diabetes mellitus with other specified complication: Secondary | ICD-10-CM

## 2013-12-12 DIAGNOSIS — E118 Type 2 diabetes mellitus with unspecified complications: Secondary | ICD-10-CM | POA: Insufficient documentation

## 2013-12-12 DIAGNOSIS — Z789 Other specified health status: Secondary | ICD-10-CM

## 2013-12-12 DIAGNOSIS — I10 Essential (primary) hypertension: Secondary | ICD-10-CM

## 2013-12-12 DIAGNOSIS — E1159 Type 2 diabetes mellitus with other circulatory complications: Secondary | ICD-10-CM

## 2013-12-12 LAB — POCT GLYCOSYLATED HEMOGLOBIN (HGB A1C): Hemoglobin A1C: 7.7

## 2013-12-12 MED ORDER — AMOXICILLIN-POT CLAVULANATE 875-125 MG PO TABS: 1.0000 | ORAL_TABLET | Freq: Two times a day (BID) | ORAL | Status: DC

## 2013-12-12 MED ORDER — CANAGLIFLOZIN 100 MG PO TABS: 1.0000 | ORAL_TABLET | Freq: Every day | ORAL | Status: DC

## 2013-12-12 NOTE — Progress Notes (Signed)
Subjective:    [name] is a 68 y.o. female who presents for follow-up of Type 2 diabetes mellitus.  She also continues to have difficulty with recurrent bronchitis. The current episode started approximately one month ago with cold-like symptoms however everything has improved except for slight postnasal drainage and a intermittently productive cough. No fever, chills, sore throat. She does have a statin intolerance.  Home blood sugar records: 130 AVG PT TEST EVERY ONCE AND A WHILE  Current symptoms/problem NONE Daily foot checks: Any foot concerns: NONE Last eye exam:  11/22/13   Medication compliance: Good Current diet: NONE Current exercise: Very little Known diabetic complications: none Cardiovascular risk factors: advanced age (older than 65 for men, 39 for women), diabetes mellitus, dyslipidemia, hypertension, obesity (BMI >= 30 kg/m2) and sedentary lifestyle   The following portions of the patient's history were reviewed and updated as appropriate: allergies, current medications, past medical history, past social history and problem list.  ROS as in subjective above    Objective:    General appearence: alert, no distress, WD/WN . Tympanic membranes and canals are normal. Throat is clear. Tonsils are normal. Neck is supple without adenopathy or thyromegaly. Cardiac exam shows a regular sinus rhythm without murmurs or gallops. Lungs are clear to auscultation.  Lab Review Lab Results  Component Value Date   HGBA1C 7.6 08/11/2013   Lab Results  Component Value Date   CHOL 230* 02/20/2013   HDL 38* 02/20/2013   LDLCALC 117* 02/20/2013   TRIG 377* 02/20/2013   CHOLHDL 6.1 02/20/2013   No results found for this basename: Concepcion Elk     Chemistry      Component Value Date/Time   NA 135 02/20/2013 1044   K 4.4 02/20/2013 1044   CL 101 02/20/2013 1044   CO2 25 02/20/2013 1044   BUN 16 02/20/2013 1044   CREATININE 0.74 02/20/2013 1044   CREATININE 0.59  04/01/2010 0520      Component Value Date/Time   CALCIUM 9.5 02/20/2013 1044   ALKPHOS 34* 02/20/2013 1044   AST 34 02/20/2013 1044   ALT 26 02/20/2013 1044   BILITOT 0.4 02/20/2013 1044        Chemistry      Component Value Date/Time   NA 135 02/20/2013 1044   K 4.4 02/20/2013 1044   CL 101 02/20/2013 1044   CO2 25 02/20/2013 1044   BUN 16 02/20/2013 1044   CREATININE 0.74 02/20/2013 1044   CREATININE 0.59 04/01/2010 0520      Component Value Date/Time   CALCIUM 9.5 02/20/2013 1044   ALKPHOS 34* 02/20/2013 1044   AST 34 02/20/2013 1044   ALT 26 02/20/2013 1044   BILITOT 0.4 02/20/2013 1044         Assessment:   Encounter Diagnoses  Name Primary?  . Hyperlipidemia associated with type 2 diabetes mellitus Yes  . Statin intolerance   . Hypertension associated with diabetes   . Obesity (BMI 30-39.9)   . BRONCHITIS, RECURRENT   . Type II or unspecified type diabetes mellitus without mention of complication, not stated as uncontrolled          Plan:    discussed the fact that her diabetes is not as well-controlled as I would like. I will add Invokana to her regimen. Discussed risks and benefits. Will also place her on Praluent; April work will be filled out to give this medication. 1.  Rx changes: Add Invokana and Praulent will also give Augmentin  2.  Education: Reviewed 'ABCs' of diabetes management (respective goals in parentheses):  A1C (<7), blood pressure (<130/80), and cholesterol (LDL <100). 3.  Compliance at present is estimated to be fair. Efforts to improve compliance (if necessary) will be directed at increased exercise. 4. Follow up: 4 months

## 2014-01-03 ENCOUNTER — Telehealth: Payer: Self-pay | Admitting: Family Medicine

## 2014-01-03 NOTE — Telephone Encounter (Signed)
P.A. PRALUENT approved til 12/30/14, left message for pt

## 2014-01-04 ENCOUNTER — Telehealth: Payer: Self-pay | Admitting: Family Medicine

## 2014-01-10 NOTE — Telephone Encounter (Signed)
lm

## 2014-01-30 ENCOUNTER — Other Ambulatory Visit: Payer: Self-pay | Admitting: Family Medicine

## 2014-03-27 ENCOUNTER — Other Ambulatory Visit: Payer: Self-pay | Admitting: Family Medicine

## 2014-03-27 ENCOUNTER — Encounter: Payer: Self-pay | Admitting: Family Medicine

## 2014-03-27 ENCOUNTER — Ambulatory Visit (INDEPENDENT_AMBULATORY_CARE_PROVIDER_SITE_OTHER): Payer: Medicare Other | Admitting: Family Medicine

## 2014-03-27 VITALS — BP 112/70 | HR 74 | Ht 64.0 in | Wt 213.0 lb

## 2014-03-27 DIAGNOSIS — E119 Type 2 diabetes mellitus without complications: Secondary | ICD-10-CM

## 2014-03-27 DIAGNOSIS — Z889 Allergy status to unspecified drugs, medicaments and biological substances status: Secondary | ICD-10-CM

## 2014-03-27 DIAGNOSIS — E1159 Type 2 diabetes mellitus with other circulatory complications: Secondary | ICD-10-CM

## 2014-03-27 DIAGNOSIS — Z789 Other specified health status: Secondary | ICD-10-CM

## 2014-03-27 DIAGNOSIS — E1169 Type 2 diabetes mellitus with other specified complication: Secondary | ICD-10-CM

## 2014-03-27 DIAGNOSIS — E785 Hyperlipidemia, unspecified: Secondary | ICD-10-CM

## 2014-03-27 DIAGNOSIS — Z23 Encounter for immunization: Secondary | ICD-10-CM

## 2014-03-27 DIAGNOSIS — I10 Essential (primary) hypertension: Secondary | ICD-10-CM

## 2014-03-27 LAB — COMPREHENSIVE METABOLIC PANEL
ALT: 80 U/L — ABNORMAL HIGH (ref 0–35)
AST: 97 U/L — ABNORMAL HIGH (ref 0–37)
Albumin: 4.4 g/dL (ref 3.5–5.2)
Alkaline Phosphatase: 32 U/L — ABNORMAL LOW (ref 39–117)
BUN: 16 mg/dL (ref 6–23)
CO2: 27 mEq/L (ref 19–32)
Calcium: 9.3 mg/dL (ref 8.4–10.5)
Chloride: 100 mEq/L (ref 96–112)
Creat: 0.72 mg/dL (ref 0.50–1.10)
Glucose, Bld: 116 mg/dL — ABNORMAL HIGH (ref 70–99)
Potassium: 4.5 mEq/L (ref 3.5–5.3)
Sodium: 136 mEq/L (ref 135–145)
Total Bilirubin: 0.5 mg/dL (ref 0.2–1.2)
Total Protein: 7.8 g/dL (ref 6.0–8.3)

## 2014-03-27 LAB — CBC WITH DIFFERENTIAL/PLATELET
Basophils Absolute: 0.1 10*3/uL (ref 0.0–0.1)
Basophils Relative: 1 % (ref 0–1)
Eosinophils Absolute: 0.3 10*3/uL (ref 0.0–0.7)
Eosinophils Relative: 6 % — ABNORMAL HIGH (ref 0–5)
HCT: 46.2 % — ABNORMAL HIGH (ref 36.0–46.0)
Hemoglobin: 15.6 g/dL — ABNORMAL HIGH (ref 12.0–15.0)
Lymphocytes Relative: 46 % (ref 12–46)
Lymphs Abs: 2.4 10*3/uL (ref 0.7–4.0)
MCH: 29.5 pg (ref 26.0–34.0)
MCHC: 33.8 g/dL (ref 30.0–36.0)
MCV: 87.3 fL (ref 78.0–100.0)
MPV: 9.7 fL (ref 9.4–12.4)
Monocytes Absolute: 0.3 10*3/uL (ref 0.1–1.0)
Monocytes Relative: 6 % (ref 3–12)
Neutro Abs: 2.2 10*3/uL (ref 1.7–7.7)
Neutrophils Relative %: 41 % — ABNORMAL LOW (ref 43–77)
Platelets: 292 10*3/uL (ref 150–400)
RBC: 5.29 MIL/uL — ABNORMAL HIGH (ref 3.87–5.11)
RDW: 13.7 % (ref 11.5–15.5)
WBC: 5.3 10*3/uL (ref 4.0–10.5)

## 2014-03-27 LAB — POCT UA - MICROALBUMIN
Albumin/Creatinine Ratio, Urine, POC: 17.8
Creatinine, POC: 95.4 mg/dL
Microalbumin Ur, POC: 17 mg/L

## 2014-03-27 LAB — LIPID PANEL
Cholesterol: 195 mg/dL (ref 0–200)
HDL: 41 mg/dL (ref >39)
LDL Cholesterol: 104 mg/dL — ABNORMAL HIGH (ref 0–99)
Total CHOL/HDL Ratio: 4.8 Ratio
Triglycerides: 252 mg/dL — ABNORMAL HIGH (ref <150)
VLDL: 50 mg/dL — ABNORMAL HIGH (ref 0–40)

## 2014-03-27 LAB — POCT GLYCOSYLATED HEMOGLOBIN (HGB A1C): Hemoglobin A1C: 7

## 2014-03-27 MED ORDER — CANAGLIFLOZIN 100 MG PO TABS: 100.0000 mg | ORAL_TABLET | Freq: Every day | ORAL | Status: DC

## 2014-03-27 MED ORDER — AMOXICILLIN-POT CLAVULANATE 875-125 MG PO TABS: 1.0000 | ORAL_TABLET | Freq: Two times a day (BID) | ORAL | Status: DC

## 2014-03-27 MED ORDER — ALIROCUMAB 75 MG/ML ~~LOC~~ SOSY: 1.0000 "application " | PREFILLED_SYRINGE | SUBCUTANEOUS | Status: DC

## 2014-03-27 NOTE — Progress Notes (Deleted)
  Subjective:    Anselm PancoastMarita T Halloran is a 68 y.o. female who presents for follow-up of Type 2 diabetes mellitus.    Home blood sugar records: patient test one time a day   Current symptoms/problems none Daily foot checks: ***   Any foot concerns: *** yes/ none Last eye exam:  11/22/13   Medication compliance: Current diet: none just watch what she eats  Current exercise: walking Known diabetic complications: {diabetes complications:1215} Cardiovascular risk factors: {cv risk factors:510}   {Common ambulatory SmartLinks:19316}  ROS as in subjective above    Objective:    There were no vitals taken for this visit.  There were no vitals filed for this visit.  General appearence: alert, no distress, WD/WN Neck: supple, no lymphadenopathy, no thyromegaly, no masses Heart: RRR, normal S1, S2, no murmurs Lungs: CTA bilaterally, no wheezes, rhonchi, or rales Abdomen: +bs, soft, non tender, non distended, no masses, no hepatomegaly, no splenomegaly Pulses: 2+ symmetric, upper and lower extremities, normal cap refill Ext: no edema Foot exam:  Neuro: foot monofilament exam normal   Lab Review Lab Results  Component Value Date   HGBA1C 7.7 12/12/2013   Lab Results  Component Value Date   CHOL 230* 02/20/2013   HDL 38* 02/20/2013   LDLCALC 117* 02/20/2013   TRIG 377* 02/20/2013   CHOLHDL 6.1 02/20/2013   No results found for: Concepcion ElkMICROALBUR, [id number]   Chemistry      Component Value Date/Time   NA 135 02/20/2013 1044   K 4.4 02/20/2013 1044   CL 101 02/20/2013 1044   CO2 25 02/20/2013 1044   BUN 16 02/20/2013 1044   CREATININE 0.74 02/20/2013 1044   CREATININE 0.59 04/01/2010 0520      Component Value Date/Time   CALCIUM 9.5 02/20/2013 1044   ALKPHOS 34* 02/20/2013 1044   AST 34 02/20/2013 1044   ALT 26 02/20/2013 1044   BILITOT 0.4 02/20/2013 1044        Chemistry      Component Value Date/Time   NA 135 02/20/2013 1044   K 4.4 02/20/2013 1044   CL 101  02/20/2013 1044   CO2 25 02/20/2013 1044   BUN 16 02/20/2013 1044   CREATININE 0.74 02/20/2013 1044   CREATININE 0.59 04/01/2010 0520      Component Value Date/Time   CALCIUM 9.5 02/20/2013 1044   ALKPHOS 34* 02/20/2013 1044   AST 34 02/20/2013 1044   ALT 26 02/20/2013 1044   BILITOT 0.4 02/20/2013 1044       Last optometry/ophthalmology exam reviewed from:    Assessment:        Plan:    1.  Rx changes: {none:33079} 2.  Education: Reviewed 'ABCs' of diabetes management (respective goals in parentheses):  A1C (<7), blood pressure (<130/80), and cholesterol (LDL <100). 3.  Compliance at present is estimated to be {good/fair/poor:33178}. Efforts to improve compliance (if necessary) will be directed at {compliance:16716}. 4. Follow up: {NUMBERS; 0-10:33138} {time:11}

## 2014-03-27 NOTE — Progress Notes (Signed)
Subjective:    [name] is a 68 y.o. female who presents for follow-up of Type 2 diabetes mellitus.  She was started on invokana at her last visit.  She reports increased urinary frequency but not dysuria or incontinence.  She has been approved for praluent due to her statin intolerance.  Home blood sugar records: patient test one time a day 130-150  Current symptoms/problems: occasional stabbing pain in the right foot but very occasionally. Daily foot checks: Regular Any foot concerns: no Last eye exam:  11/22/13   Medication compliance:  Good. Only uses [place] traveling.   Current diet: none just watch what she eats  Current exercise: walking; trying to keep busy.  Known diabetic complications: none Cardiovascular risk factors: advanced age (older than 3055 for men, 4865 for women), diabetes mellitus, dyslipidemia, hypertension and obesity (BMI >= 30 kg/m2)   The following portions of the patient's history were reviewed and updated as appropriate: allergies, current medications, past family history, past medical history, past social history, past surgical history and problem list.  ROS as in subjective above    Objective:    BP 112/70 mmHg  Pulse 74  Ht 5\' 4"  (1.626 m)  Wt 213 lb (96.616 kg)  BMI 36.54 kg/m2  SpO2 98%  Filed Vitals:   03/27/14 0923  BP: 112/70  Pulse: 74    General appearence: alert, no distress, WD/WN Heart: RRR, normal S1, S2, no murmurs Lungs: CTA bilaterally, no wheezes, rhonchi, or rales Pulses: 2+ symmetric, upper and lower extremities, normal cap refill Ext: no edema Foot exam:  Neuro: foot monofilament exam normal.  No ulceration or wounds.  Vibratory sensation intact.  1+ DP & PT pulses bilaterally.   Lab Review Lab Results  Component Value Date   HGBA1C 7.7 12/12/2013   Lab Results  Component Value Date   CHOL 230* 02/20/2013   HDL 38* 02/20/2013   LDLCALC 117* 02/20/2013   TRIG 377* 02/20/2013   CHOLHDL 6.1 02/20/2013    No results found for: Concepcion ElkMICROALBUR, [id number]   Chemistry      Component Value Date/Time   NA 135 02/20/2013 1044   K 4.4 02/20/2013 1044   CL 101 02/20/2013 1044   CO2 25 02/20/2013 1044   BUN 16 02/20/2013 1044   CREATININE 0.74 02/20/2013 1044   CREATININE 0.59 04/01/2010 0520      Component Value Date/Time   CALCIUM 9.5 02/20/2013 1044   ALKPHOS 34* 02/20/2013 1044   AST 34 02/20/2013 1044   ALT 26 02/20/2013 1044   BILITOT 0.4 02/20/2013 1044        Chemistry      Component Value Date/Time   NA 135 02/20/2013 1044   K 4.4 02/20/2013 1044   CL 101 02/20/2013 1044   CO2 25 02/20/2013 1044   BUN 16 02/20/2013 1044   CREATININE 0.74 02/20/2013 1044   CREATININE 0.59 04/01/2010 0520      Component Value Date/Time   CALCIUM 9.5 02/20/2013 1044   ALKPHOS 34* 02/20/2013 1044   AST 34 02/20/2013 1044   ALT 26 02/20/2013 1044   BILITOT 0.4 02/20/2013 1044     Last optometry/ophthalmology exam reviewed from: 11/2013  A1C Today: 7.0 from 7.7 3 months ago.   Assessment:   Her diabetes control has improved with the addition of canaglifozin as demonstrated by her A1C reduction of 0.7.  Additionally, she has lost 9 pounds.  She should continue on the canglifozin.  Her last LDL was 117  and she has not been able to tolerate statin therapy.  Now that she has been approved for praluent she should begin.  She will also receive her annual flu vaccination today.  Plan:    1.  Rx changes: none 2.  Education: Reviewed 'ABCs' of diabetes management (respective goals in parentheses):  A1C (<7), blood pressure (<130/80), and cholesterol (LDL <100). 3.  Compliance at present is estimated to be good. Efforts to improve compliance (if necessary) will be directed at increased exercise. 4. Follow up: 4 months   She will be spending several [place] in [place] and will decide whether to start her Praluent now or wait till she returns. I also gave her a prescription for Augmentin. She  does have a previous history of his disease and would like the medication and Mat CarneClay she gets sick when she is in [place].

## 2014-03-28 ENCOUNTER — Encounter: Payer: Self-pay | Admitting: Family Medicine

## 2014-03-28 LAB — HEPATITIS C ANTIBODY: HCV Ab: NEGATIVE

## 2014-04-10 ENCOUNTER — Ambulatory Visit: Payer: Medicare Other | Admitting: Family Medicine

## 2014-07-27 ENCOUNTER — Ambulatory Visit: Payer: Medicare Other | Admitting: Family Medicine

## 2014-08-03 ENCOUNTER — Ambulatory Visit (INDEPENDENT_AMBULATORY_CARE_PROVIDER_SITE_OTHER): Payer: Medicare Other | Admitting: Family Medicine

## 2014-08-03 ENCOUNTER — Encounter: Payer: Self-pay | Admitting: Family Medicine

## 2014-08-03 VITALS — BP 110/60 | HR 69 | Wt 201.0 lb

## 2014-08-03 DIAGNOSIS — E1169 Type 2 diabetes mellitus with other specified complication: Secondary | ICD-10-CM | POA: Diagnosis not present

## 2014-08-03 DIAGNOSIS — E785 Hyperlipidemia, unspecified: Secondary | ICD-10-CM | POA: Diagnosis not present

## 2014-08-03 DIAGNOSIS — Z79899 Other long term (current) drug therapy: Secondary | ICD-10-CM | POA: Diagnosis not present

## 2014-08-03 DIAGNOSIS — E1159 Type 2 diabetes mellitus with other circulatory complications: Secondary | ICD-10-CM

## 2014-08-03 DIAGNOSIS — I10 Essential (primary) hypertension: Secondary | ICD-10-CM

## 2014-08-03 DIAGNOSIS — E119 Type 2 diabetes mellitus without complications: Secondary | ICD-10-CM | POA: Diagnosis not present

## 2014-08-03 DIAGNOSIS — E669 Obesity, unspecified: Secondary | ICD-10-CM | POA: Diagnosis not present

## 2014-08-03 DIAGNOSIS — I152 Hypertension secondary to endocrine disorders: Secondary | ICD-10-CM

## 2014-08-03 LAB — POCT GLYCOSYLATED HEMOGLOBIN (HGB A1C): Hemoglobin A1C: 6.3

## 2014-08-03 MED ORDER — LOSARTAN POTASSIUM 25 MG PO TABS: 25.0000 mg | ORAL_TABLET | Freq: Every day | ORAL | Status: DC

## 2014-08-03 MED ORDER — CANAGLIFLOZIN 100 MG PO TABS: 100.0000 mg | ORAL_TABLET | Freq: Every day | ORAL | Status: DC

## 2014-08-03 MED ORDER — ZOLPIDEM TARTRATE 5 MG PO TABS: 5.0000 mg | ORAL_TABLET | Freq: Every evening | ORAL | Status: DC | PRN

## 2014-08-03 MED ORDER — FENOFIBRATE 160 MG PO TABS: 160.0000 mg | ORAL_TABLET | Freq: Every day | ORAL | Status: DC

## 2014-08-03 MED ORDER — METFORMIN HCL ER 750 MG PO TB24: 750.0000 mg | ORAL_TABLET | Freq: Two times a day (BID) | ORAL | Status: DC

## 2014-08-03 NOTE — Progress Notes (Signed)
  Subjective:    Patient ID: [name], female    DOB: [birth year], 69 y.o.   MRN: [id number]  [name] is a [age] y.o. female who presents for follow-up of Type 2 diabetes mellitus.  Home blood sugar records: Patient test one time a day Current symptoms/problems loss of weight 12lb from nov with new med.She does note that it made her very thirsty. She also has had a few episodes of low blood sugar in the 90 range. Daily foot checks:   Any foot concerns: none Exercise: yard work Eyes: 11/22/13 just had a visual field last week The following portions of the patient's history were reviewed and updated as appropriate: allergies, current medications, past medical history, past social history and problem list.  ROS as in subjective above.     Objective:    Physical Exam Alert and in no distress otherwise not examined.   Lab Review Diabetic Labs Latest Ref Rng 03/27/2014 12/12/2013 08/11/2013 02/20/2013 02/11/2012  HbA1c - 7.0 7.7 7.6 7.2 -  Chol 0 - 200 mg/dL 132195 - - 440(N230(H) 027(O243(H)  HDL >39 mg/dL 41 - - 53(G38(L) 64(Q36(L)  Calc LDL 0 - 99 mg/dL 034(V104(H) - - 425(Z117(H) -  Triglycerides <150 mg/dL 563(O252(H) - - 756(E377(H) 332(R430(H)  Creatinine 0.50 - 1.10 mg/dL 5.180.72 - - 8.410.74 6.600.74   BP/Weight 03/27/2014 12/12/2013 08/11/2013 02/20/2013 06/19/2011  Systolic BP 112 110 94 124 122  Diastolic BP 70 80 68 80 76  Wt. (Lbs) 213 222 222 222 222  BMI 36.54 38.7 38.09 38.09 38.09   Foot/eye exam completion dates Latest Ref Rng 11/22/2013  Eye Exam No Retinopathy No Retinopathy  Foot Form Completion - -    [name]  reports that she has been smoking.  She has never used smokeless tobacco. She reports that she does not drink alcohol or use illicit drugs. Hemoglobin A1c 6.3    Assessment & Plan:  Hypertension associated with diabetes - Plan: losartan (COZAAR) 25 MG tablet, Basic Metabolic Panel  Hyperlipidemia associated with type 2 diabetes mellitus - Plan: fenofibrate 160 MG tablet  Type 2 diabetes mellitus  without complication - Plan: POCT glycosylated hemoglobin (Hb A1C), canagliflozin (INVOKANA) 100 MG TABS tablet, metFORMIN (GLUCOPHAGE-XR) 750 MG 24 hr tablet, Basic Metabolic Panel  Encounter for long-term (current) use of medications - Plan: zolpidem (AMBIEN) 5 MG tablet, Basic Metabolic Panel  Obesity (BMI 30-39.9)    1. Rx changes: none 2. Education: Reviewed 'ABCs' of diabetes management (respective goals in parentheses):  A1C (<7), blood pressure (<130/80), and cholesterol (LDL <100). 3. Compliance at present is estimated to be good. Efforts to improve compliance (if necessary) will be directed at no change. 4. Follow up: 4 months  5. She is very astute on taking care of her diabetes. We discussed low blood sugar and if she has many more hypoglycemic episodes, I will probably readjust her metformin. It does seem as if the Invokana has really helped a lot with her weight loss. She is very happy with this. Her Ambien was also renewed. She really only used 30 of these for the entire year.

## 2014-08-04 LAB — BASIC METABOLIC PANEL
BUN: 18 mg/dL (ref 6–23)
CO2: 23 mEq/L (ref 19–32)
Calcium: 9 mg/dL (ref 8.4–10.5)
Chloride: 102 mEq/L (ref 96–112)
Creat: 0.69 mg/dL (ref 0.50–1.10)
Glucose, Bld: 104 mg/dL — ABNORMAL HIGH (ref 70–99)
Potassium: 4.8 mEq/L (ref 3.5–5.3)
Sodium: 137 mEq/L (ref 135–145)

## 2014-09-06 ENCOUNTER — Telehealth: Payer: Self-pay | Admitting: Family Medicine

## 2014-09-06 NOTE — Telephone Encounter (Signed)
Left message, regarding Praluent

## 2014-09-26 ENCOUNTER — Telehealth: Payer: Self-pay | Admitting: Family Medicine

## 2014-09-26 NOTE — Telephone Encounter (Signed)
I had called pt to find out about the Praluent, she states she is not taking it and doesn't want to. Also states she just fly back in last night and the problem she talked to you before with her legs and flying has happened again, advised you are on vacation and she declined wanting to see another provider.  She will call back if it's still happening on Tuesday when you are back.

## 2014-10-19 ENCOUNTER — Encounter (HOSPITAL_BASED_OUTPATIENT_CLINIC_OR_DEPARTMENT_OTHER): Payer: Self-pay | Admitting: *Deleted

## 2014-10-19 ENCOUNTER — Emergency Department (HOSPITAL_BASED_OUTPATIENT_CLINIC_OR_DEPARTMENT_OTHER)
Admission: EM | Admit: 2014-10-19 | Discharge: 2014-10-19 | Disposition: A | Discharge status: Home / Self Care | Payer: Medicare Other | Attending: Emergency Medicine | Admitting: Emergency Medicine

## 2014-10-19 ENCOUNTER — Emergency Department (HOSPITAL_BASED_OUTPATIENT_CLINIC_OR_DEPARTMENT_OTHER): Payer: Medicare Other

## 2014-10-19 DIAGNOSIS — M79671 Pain in right foot: Secondary | ICD-10-CM

## 2014-10-19 DIAGNOSIS — Y9289 Other specified places as the place of occurrence of the external cause: Secondary | ICD-10-CM | POA: Insufficient documentation

## 2014-10-19 DIAGNOSIS — Z8673 Personal history of transient ischemic attack (TIA), and cerebral infarction without residual deficits: Secondary | ICD-10-CM | POA: Diagnosis not present

## 2014-10-19 DIAGNOSIS — X58XXXA Exposure to other specified factors, initial encounter: Secondary | ICD-10-CM | POA: Diagnosis not present

## 2014-10-19 DIAGNOSIS — G4700 Insomnia, unspecified: Secondary | ICD-10-CM | POA: Diagnosis not present

## 2014-10-19 DIAGNOSIS — E669 Obesity, unspecified: Secondary | ICD-10-CM | POA: Diagnosis not present

## 2014-10-19 DIAGNOSIS — Y9389 Activity, other specified: Secondary | ICD-10-CM | POA: Insufficient documentation

## 2014-10-19 DIAGNOSIS — Z720 Tobacco use: Secondary | ICD-10-CM | POA: Diagnosis not present

## 2014-10-19 DIAGNOSIS — Z79899 Other long term (current) drug therapy: Secondary | ICD-10-CM | POA: Diagnosis not present

## 2014-10-19 DIAGNOSIS — J449 Chronic obstructive pulmonary disease, unspecified: Secondary | ICD-10-CM | POA: Diagnosis not present

## 2014-10-19 DIAGNOSIS — S99921A Unspecified injury of right foot, initial encounter: Secondary | ICD-10-CM | POA: Diagnosis present

## 2014-10-19 DIAGNOSIS — Z7982 Long term (current) use of aspirin: Secondary | ICD-10-CM | POA: Diagnosis not present

## 2014-10-19 DIAGNOSIS — Y998 Other external cause status: Secondary | ICD-10-CM | POA: Insufficient documentation

## 2014-10-19 NOTE — ED Provider Notes (Signed)
CSN: [id number]     Arrival date & time 10/19/14  1442 History   First MD Initiated Contact with Patient 10/19/14 1448     Chief Complaint  Patient presents with  . Foot Injury     (Consider location/radiation/quality/duration/timing/severity/associated sxs/prior Treatment) HPI Comments: Patient presents with complaint of right foot injury sustained just prior to arrival. Patient stepped 1-2 feet off of a tractor. She felt a crunch and a pop when she hit the ground. She had immediate pain but was able to ambulate. Pain is worse when she dorsiflexes her foot. Patient iced the foot prior to arrival. No other treatments. Patient denies knee or hip pain. No back pain. Onset of symptoms acute. Course is constant.  Patient is a 69 y.o. female presenting with foot injury. The history is provided by the patient.  Foot Injury Associated symptoms: no back pain and no neck pain     Past Medical History  Diagnosis Date  . Obesity   . CVA (cerebral infarction)   . Allergy   . Nasal polyps   . Insomnia   . COPD (chronic obstructive pulmonary disease)    Past Surgical History  Procedure Laterality Date  . Replacement total knee    . Abdominal hysterectomy    . Nasal sinus surgery     No family history on file. History  Substance Use Topics  . Smoking status: Current Some Day Smoker  . Smokeless tobacco: Never Used  . Alcohol Use: No   OB History    No data available     Review of Systems  Constitutional: Negative for activity change.  Musculoskeletal: Positive for arthralgias and gait problem. Negative for back pain, joint swelling and neck pain.  Skin: Negative for wound.  Neurological: Negative for weakness and numbness.      Allergies  Ibuprofen and Lipitor  Home Medications   Prior to Admission medications   Medication Sig Start Date End Date Taking? Authorizing Provider  aspirin 81 MG EC tablet Take 81 mg by mouth daily.      Historical Provider, MD  canagliflozin  (INVOKANA) 100 MG TABS tablet Take 1 tablet (100 mg total) by mouth daily. 08/03/14   Ronnald Nian, MD  fenofibrate 160 MG tablet Take 1 tablet (160 mg total) by mouth daily. 08/03/14   Ronnald Nian, MD  losartan (COZAAR) 25 MG tablet Take 1 tablet (25 mg total) by mouth daily. 08/03/14   Ronnald Nian, MD  metFORMIN (GLUCOPHAGE-XR) 750 MG 24 hr tablet Take 1 tablet (750 mg total) by mouth 2 (two) times daily. 08/03/14   Ronnald Nian, MD  Multiple Vitamins-Minerals (MULTIVITAMIN WITH MINERALS) tablet Take 1 tablet by mouth daily.    Historical Provider, MD  Omega-3 Fatty Acids (FISH OIL) 1200 MG CAPS Take 2 capsules by mouth daily.      Historical Provider, MD  zolpidem (AMBIEN) 5 MG tablet Take 1 tablet (5 mg total) by mouth at bedtime as needed for sleep. 08/03/14   Ronnald Nian, MD   BP 134/83 mmHg  Pulse 67  Temp(Src) 98.1 F (36.7 C) (Oral)  Resp 18  Ht  (1.626 m)  Wt 195 lb (88.451 kg)  BMI 33.46 kg/m2  SpO2 97% Physical Exam  Constitutional: She appears well-developed and well-nourished.  HENT:  Head: Normocephalic and atraumatic.  Eyes: Pupils are equal, round, and reactive to light.  Neck: Normal range of motion. Neck supple.  Cardiovascular: Exam reveals no decreased pulses.  Pulses:      Dorsalis pedis pulses are 2+ on the right side, and 2+ on the left side.  Musculoskeletal: She exhibits tenderness. She exhibits no edema.       Right hip: Normal.       Right knee: Normal.       Right ankle: Normal. No tenderness. No head of 5th metatarsal and no proximal fibula tenderness found. Achilles tendon normal.       Right foot: There is tenderness (plantar aspect). There is normal range of motion, no bony tenderness and no swelling.       Feet:  Neurological: She is alert. No sensory deficit.  Motor, sensation, and vascular distal to the injury is fully intact.   Skin: Skin is warm and dry.  Psychiatric: She has a normal mood and affect.  Nursing note and vitals  reviewed.   ED Course  Procedures (including critical care time) Labs Review Labs Reviewed - No data to display  Imaging Review Dg Foot Complete Right  10/19/2014   CLINICAL DATA:  Right foot injury 1 hour prior.She stepped off a tractor and felt a pop, pt co pain in right foot  EXAM: RIGHT FOOT COMPLETE - 3+ VIEW  COMPARISON:  None.  FINDINGS: No fracture or dislocation of mid foot or forefoot. The phalanges are normal. The calcaneus is normal. No soft tissue abnormality. Accessory ossicle at base of the fifth metatarsal (os perineum). Spurring of the plantar aspect of the calcaneus.  IMPRESSION: No fracture or dislocation.   Electronically Signed   By: Genevive Bi M.D.   On: 10/19/2014 15:33     EKG Interpretation None       3:29 PM Patient seen and examined. Work-up initiated.   Vital signs reviewed and are as follows: BP 134/83 mmHg  Pulse 67  Temp(Src) 98.1 F (36.7 C) (Oral)  Resp 18  Ht 5\' 4"  (1.626 m)  Wt 195 lb (88.451 kg)  BMI 33.46 kg/m2  SpO2 97%  Patient informed of negative x-ray results. Ace wrap applied. Counseled on rice protocol. Patient has an orthopedist. She is to follow-up if she still has continued pain or trouble walking in 5-7 days.  MDM   Final diagnoses:  Right foot pain   Patient with right foot pain after stepping from a tractor. X-rays are negative. Suspect possible fascial injury. Rice protocol indicated with appropriate follow-up as needed. Patient has seen an orthopedist in the past for bilateral knee replacements. Lower extremities are neurovascularly intact.    Renne Crigler, PA-C 10/19/14 1639  Linwood Dibbles, MD 10/20/14 [phone]

## 2014-10-19 NOTE — Discharge Instructions (Signed)
Please read and follow all provided instructions.  Your diagnoses today include:  1. Right foot pain     Tests performed today include:  An x-ray of the affected area - does NOT show any broken bones  Vital signs. See below for your results today.   Medications prescribed:   Naproxen - anti-inflammatory pain medication  Do not exceed 500mg  naproxen every 12 hours, take with food  You have been prescribed an anti-inflammatory medication or NSAID. Take with food. Take smallest effective dose for the shortest duration needed for your pain. Stop taking if you experience stomach pain or vomiting.   Take any prescribed medications only as directed.  Home care instructions:   Follow any educational materials contained in this packet  Follow R.I.C.E. Protocol:  R - rest your injury   I  - use ice on injury without applying directly to skin  C - compress injury with bandage or splint  E - elevate the injury as much as possible  Follow-up instructions: Please follow-up with your primary care provider or the provided orthopedic physician (bone specialist) if you continue to have significant pain in 1 week. In this case you may have a more severe injury that requires further care.   Return instructions:   Please return if your toes or feet are numb or tingling, appear gray or blue, or you have severe pain (also elevate the leg and loosen splint or wrap if you were given one)  Please return to the Emergency Department if you experience worsening symptoms.   Please return if you have any other emergent concerns.  Additional Information:  Your vital signs today were: BP 134/83 mmHg   Pulse 67   Temp(Src) 98.1 F (36.7 C) (Oral)   Resp 18   Ht 5\' 4"  (1.626 m)   Wt 195 lb (88.451 kg)   BMI 33.46 kg/m2   SpO2 97% If your blood pressure (BP) was elevated above 135/85 this visit, please have this repeated by your doctor within one month. --------------

## 2014-10-19 NOTE — ED Notes (Signed)
Right foot injury 45 minutes ago. She stepped off a tractor and felt a pop.

## 2014-10-29 ENCOUNTER — Other Ambulatory Visit: Payer: Self-pay

## 2014-12-06 ENCOUNTER — Ambulatory Visit (INDEPENDENT_AMBULATORY_CARE_PROVIDER_SITE_OTHER): Payer: Medicare Other | Admitting: Family Medicine

## 2014-12-06 ENCOUNTER — Encounter: Payer: Self-pay | Admitting: Family Medicine

## 2014-12-06 VITALS — BP 110/70 | HR 83 | Ht 64.0 in | Wt 201.0 lb

## 2014-12-06 DIAGNOSIS — E119 Type 2 diabetes mellitus without complications: Secondary | ICD-10-CM

## 2014-12-06 DIAGNOSIS — I10 Essential (primary) hypertension: Secondary | ICD-10-CM | POA: Diagnosis not present

## 2014-12-06 DIAGNOSIS — E669 Obesity, unspecified: Secondary | ICD-10-CM | POA: Diagnosis not present

## 2014-12-06 DIAGNOSIS — J328 Other chronic sinusitis: Secondary | ICD-10-CM | POA: Diagnosis not present

## 2014-12-06 DIAGNOSIS — E785 Hyperlipidemia, unspecified: Secondary | ICD-10-CM | POA: Diagnosis not present

## 2014-12-06 DIAGNOSIS — E1169 Type 2 diabetes mellitus with other specified complication: Secondary | ICD-10-CM | POA: Diagnosis not present

## 2014-12-06 DIAGNOSIS — I152 Hypertension secondary to endocrine disorders: Secondary | ICD-10-CM

## 2014-12-06 DIAGNOSIS — Z789 Other specified health status: Secondary | ICD-10-CM

## 2014-12-06 DIAGNOSIS — E1159 Type 2 diabetes mellitus with other circulatory complications: Secondary | ICD-10-CM

## 2014-12-06 LAB — POCT GLYCOSYLATED HEMOGLOBIN (HGB A1C): Hemoglobin A1C: 6

## 2014-12-06 MED ORDER — METFORMIN HCL 500 MG PO TABS: 500.0000 mg | ORAL_TABLET | Freq: Two times a day (BID) | ORAL | Status: DC

## 2014-12-06 MED ORDER — AMOXICILLIN-POT CLAVULANATE 875-125 MG PO TABS: 1.0000 | ORAL_TABLET | Freq: Two times a day (BID) | ORAL | Status: DC

## 2014-12-06 NOTE — Progress Notes (Signed)
  Subjective:    Patient ID: [name], female    DOB: 1945-06-19, 69 y.o.   MRN: [id number]  [name] is a 69 y.o. female who presents for follow-up of Type 2 diabetes mellitus.   Home blood sugar records:Patient test BID Current symptoms/problems Been having to many low readings in the morning not feeling very well Daily foot checks:yes   Any foot concerns: none Exercise: yard work  Eye: 11/15/14 [facility] Dr.[place] all looked okay The following portions of the patient's history were reviewed and updated as appropriate: allergies, current medications, past medical history, past social history and problem list.  Still has an occasional cigarette. She does have a previous history of difficulty with sinus disease. She did use Augmentin on her last trip to [place] and would like a refill on this medication. ROS as in subjective above.     Objective:    Physical Exam Alert and in no distress otherwise not examined.  Blood pressure 110/70, pulse 83, height  (1.626 m), weight 201 lb (91.173 kg), SpO2 96 %.  Lab Review Diabetic Labs Latest Ref Rng 12/06/2014 08/03/2014 03/27/2014 12/12/2013 08/11/2013  HbA1c - 6.0 6.3 7.0 7.7 7.6  Chol 0 - 200 mg/dL - - 409 - -  HDL >81 mg/dL - - 41 - -  Calc LDL 0 - 99 mg/dL - - 191(Y) - -  Triglycerides <150 mg/dL - - 782(N) - -  Creatinine 0.50 - 1.10 mg/dL - 5.62 1.30 - -   BP/Weight 12/06/2014 10/19/2014 08/03/2014 03/27/2014 12/12/2013  Systolic BP 110 131 110 112 110  Diastolic BP 70 79 60 70 80  Wt. (Lbs) 201 195 201 213 222  BMI 34.48 33.46 34.48 36.54 38.7   Foot/eye exam completion dates Latest Ref Rng 11/22/2013  Eye Exam No Retinopathy No Retinopathy  Foot Form Completion - -   hemoglobin A1c is 6.0  [name]  reports that she has been smoking.  She has never used smokeless tobacco. She reports that she does not drink alcohol or use illicit drugs.     Assessment & Plan:    Hyperlipidemia associated with type 2 diabetes  mellitus  Hypertension associated with diabetes  Type 2 diabetes mellitus without complication - Plan: POCT glycosylated hemoglobin (Hb A1C), metFORMIN (GLUCOPHAGE) 500 MG tablet  Obesity (BMI 30-39.9)  Statin intolerance  Other chronic sinusitis - Plan: amoxicillin-clavulanate (AUGMENTIN) 875-125 MG per tablet   1. Rx changes: Decrease metformin to 500 twice a day and give Augmentin. she has a very good handle on when to use it Augmentin for her underlying chronic sinus disease. 2. Education: Reviewed 'ABCs' of diabetes management (respective goals in parentheses):  A1C (<7), blood pressure (<130/80), and cholesterol (LDL <100). 3. Compliance at present is estimated to be excellent. Efforts to improve compliance (if necessary) will be directed at No change. 4. Follow up: 4 months

## 2015-02-01 ENCOUNTER — Other Ambulatory Visit: Payer: Self-pay | Admitting: Family Medicine

## 2015-03-27 ENCOUNTER — Telehealth: Payer: Self-pay

## 2015-03-27 ENCOUNTER — Encounter (HOSPITAL_COMMUNITY)
Admission: EM | Disposition: A | Discharge status: Home / Self Care | Payer: Self-pay | Source: Home / Self Care | Attending: Emergency Medicine

## 2015-03-27 ENCOUNTER — Inpatient Hospital Stay (HOSPITAL_COMMUNITY): Payer: Medicare Other

## 2015-03-27 ENCOUNTER — Ambulatory Visit (HOSPITAL_COMMUNITY)
Admission: EM | Admit: 2015-03-27 | Discharge: 2015-03-28 | Disposition: A | Discharge status: Home / Self Care | Payer: Medicare Other | Attending: Cardiology | Admitting: Cardiology

## 2015-03-27 ENCOUNTER — Encounter (HOSPITAL_COMMUNITY): Payer: Self-pay | Admitting: Emergency Medicine

## 2015-03-27 ENCOUNTER — Ambulatory Visit (HOSPITAL_COMMUNITY): Admit: 2015-03-27 | Payer: Self-pay | Admitting: Cardiology

## 2015-03-27 ENCOUNTER — Telehealth: Payer: Self-pay | Admitting: Family Medicine

## 2015-03-27 DIAGNOSIS — Z6834 Body mass index (BMI) 34.0-34.9, adult: Secondary | ICD-10-CM | POA: Diagnosis not present

## 2015-03-27 DIAGNOSIS — I313 Pericardial effusion (noninflammatory): Secondary | ICD-10-CM | POA: Insufficient documentation

## 2015-03-27 DIAGNOSIS — E669 Obesity, unspecified: Secondary | ICD-10-CM | POA: Diagnosis not present

## 2015-03-27 DIAGNOSIS — R0789 Other chest pain: Secondary | ICD-10-CM | POA: Diagnosis not present

## 2015-03-27 DIAGNOSIS — Z8673 Personal history of transient ischemic attack (TIA), and cerebral infarction without residual deficits: Secondary | ICD-10-CM | POA: Diagnosis not present

## 2015-03-27 DIAGNOSIS — I10 Essential (primary) hypertension: Secondary | ICD-10-CM | POA: Diagnosis not present

## 2015-03-27 DIAGNOSIS — Z79899 Other long term (current) drug therapy: Secondary | ICD-10-CM | POA: Diagnosis not present

## 2015-03-27 DIAGNOSIS — J449 Chronic obstructive pulmonary disease, unspecified: Secondary | ICD-10-CM | POA: Insufficient documentation

## 2015-03-27 DIAGNOSIS — R61 Generalized hyperhidrosis: Secondary | ICD-10-CM | POA: Insufficient documentation

## 2015-03-27 DIAGNOSIS — E119 Type 2 diabetes mellitus without complications: Secondary | ICD-10-CM | POA: Diagnosis not present

## 2015-03-27 DIAGNOSIS — E041 Nontoxic single thyroid nodule: Secondary | ICD-10-CM | POA: Diagnosis not present

## 2015-03-27 DIAGNOSIS — E785 Hyperlipidemia, unspecified: Secondary | ICD-10-CM | POA: Diagnosis not present

## 2015-03-27 DIAGNOSIS — R071 Chest pain on breathing: Secondary | ICD-10-CM

## 2015-03-27 DIAGNOSIS — R0602 Shortness of breath: Secondary | ICD-10-CM

## 2015-03-27 DIAGNOSIS — Z7982 Long term (current) use of aspirin: Secondary | ICD-10-CM | POA: Insufficient documentation

## 2015-03-27 DIAGNOSIS — R079 Chest pain, unspecified: Secondary | ICD-10-CM

## 2015-03-27 DIAGNOSIS — Z7984 Long term (current) use of oral hypoglycemic drugs: Secondary | ICD-10-CM | POA: Diagnosis not present

## 2015-03-27 DIAGNOSIS — I2510 Atherosclerotic heart disease of native coronary artery without angina pectoris: Secondary | ICD-10-CM | POA: Insufficient documentation

## 2015-03-27 DIAGNOSIS — I213 ST elevation (STEMI) myocardial infarction of unspecified site: Secondary | ICD-10-CM

## 2015-03-27 DIAGNOSIS — F17200 Nicotine dependence, unspecified, uncomplicated: Secondary | ICD-10-CM | POA: Diagnosis not present

## 2015-03-27 HISTORY — DX: Type 2 diabetes mellitus without complications: E11.9

## 2015-03-27 HISTORY — PX: CARDIAC CATHETERIZATION: SHX172

## 2015-03-27 LAB — I-STAT CHEM 8, ED
BUN: 18 mg/dL (ref 6–20)
Calcium, Ion: 1.1 mmol/L — ABNORMAL LOW (ref 1.13–1.30)
Chloride: 102 mmol/L (ref 101–111)
Creatinine, Ser: 0.7 mg/dL (ref 0.44–1.00)
Glucose, Bld: 185 mg/dL — ABNORMAL HIGH (ref 65–99)
HCT: 52 % — ABNORMAL HIGH (ref 36.0–46.0)
Hemoglobin: 17.7 g/dL — ABNORMAL HIGH (ref 12.0–15.0)
Potassium: 4 mmol/L (ref 3.5–5.1)
Sodium: 138 mmol/L (ref 135–145)
TCO2: 22 mmol/L (ref 0–100)

## 2015-03-27 LAB — BASIC METABOLIC PANEL
Anion gap: 9 (ref 5–15)
BUN: 15 mg/dL (ref 6–20)
CO2: 24 mmol/L (ref 22–32)
Calcium: 9.3 mg/dL (ref 8.9–10.3)
Chloride: 102 mmol/L (ref 101–111)
Creatinine, Ser: 0.85 mg/dL (ref 0.44–1.00)
GFR calc Af Amer: >60 mL/min (ref >60)
GFR calc non Af Amer: >60 mL/min (ref >60)
Glucose, Bld: 185 mg/dL — ABNORMAL HIGH (ref 65–99)
Potassium: 3.9 mmol/L (ref 3.5–5.1)
Sodium: 135 mmol/L (ref 135–145)

## 2015-03-27 LAB — CBC
HCT: 46.2 % — ABNORMAL HIGH (ref 36.0–46.0)
Hemoglobin: 15.3 g/dL — ABNORMAL HIGH (ref 12.0–15.0)
MCH: 29.8 pg (ref 26.0–34.0)
MCHC: 33.1 g/dL (ref 30.0–36.0)
MCV: 89.9 fL (ref 78.0–100.0)
Platelets: 165 10*3/uL (ref 150–400)
RBC: 5.14 MIL/uL — ABNORMAL HIGH (ref 3.87–5.11)
RDW: 13.2 % (ref 11.5–15.5)
WBC: 10.7 10*3/uL — ABNORMAL HIGH (ref 4.0–10.5)

## 2015-03-27 LAB — GLUCOSE, CAPILLARY: Glucose-Capillary: 176 mg/dL — ABNORMAL HIGH (ref 65–99)

## 2015-03-27 LAB — I-STAT TROPONIN, ED: Troponin i, poc: 0 ng/mL (ref 0.00–0.08)

## 2015-03-27 LAB — PROTIME-INR
INR: 1.13 (ref 0.00–1.49)
Prothrombin Time: 14.7 seconds (ref 11.6–15.2)

## 2015-03-27 LAB — TROPONIN I
Troponin I: <0.03 ng/mL (ref <0.031)
Troponin I: <0.03 ng/mL (ref <0.031)

## 2015-03-27 LAB — APTT: aPTT: 31 seconds (ref 24–37)

## 2015-03-27 SURGERY — LEFT HEART CATH AND CORONARY ANGIOGRAPHY

## 2015-03-27 MED ORDER — HEPARIN SODIUM (PORCINE) 5000 UNIT/ML IJ SOLN
INTRAMUSCULAR | Status: AC
  Filled 2015-03-27: qty 1

## 2015-03-27 MED ORDER — SODIUM CHLORIDE 0.9 % IV SOLN: 250.0000 mL | INTRAVENOUS | Status: DC | PRN

## 2015-03-27 MED ORDER — NITROGLYCERIN 1 MG/10 ML FOR IR/CATH LAB
INTRA_ARTERIAL | Status: DC | PRN
  Administered 2015-03-27: 18:00:00

## 2015-03-27 MED ORDER — LOSARTAN POTASSIUM 25 MG PO TABS
25.0000 mg | ORAL_TABLET | Freq: Every day | ORAL | Status: DC
  Administered 2015-03-28: 25 mg via ORAL
  Filled 2015-03-27: qty 1

## 2015-03-27 MED ORDER — ALPRAZOLAM 0.25 MG PO TABS: 1.0000 mg | ORAL_TABLET | Freq: Two times a day (BID) | ORAL | Status: DC | PRN

## 2015-03-27 MED ORDER — HYDROMORPHONE HCL 1 MG/ML IJ SOLN
INTRAMUSCULAR | Status: DC | PRN
  Administered 2015-03-27: 0.5 mg via INTRAVENOUS

## 2015-03-27 MED ORDER — INSULIN ASPART 100 UNIT/ML ~~LOC~~ SOLN
0.0000 [IU] | Freq: Three times a day (TID) | SUBCUTANEOUS | Status: DC
  Filled 2015-03-27: qty 0.15

## 2015-03-27 MED ORDER — ADULT MULTIVITAMIN W/MINERALS CH
1.0000 | ORAL_TABLET | Freq: Every day | ORAL | Status: DC
  Administered 2015-03-28: 1 via ORAL
  Filled 2015-03-27: qty 1

## 2015-03-27 MED ORDER — HEPARIN (PORCINE) IN NACL 2-0.9 UNIT/ML-% IJ SOLN
INTRAMUSCULAR | Status: AC
  Filled 2015-03-27: qty 1500

## 2015-03-27 MED ORDER — FENTANYL CITRATE (PF) 100 MCG/2ML IJ SOLN: 25.0000 ug | INTRAMUSCULAR | Status: DC | PRN

## 2015-03-27 MED ORDER — PANTOPRAZOLE SODIUM 40 MG PO TBEC
40.0000 mg | DELAYED_RELEASE_TABLET | Freq: Every day | ORAL | Status: DC
  Administered 2015-03-28: 40 mg via ORAL
  Filled 2015-03-27 (×2): qty 1

## 2015-03-27 MED ORDER — ZOLPIDEM TARTRATE 5 MG PO TABS
5.0000 mg | ORAL_TABLET | Freq: Every evening | ORAL | Status: DC | PRN
  Administered 2015-03-27: 5 mg via ORAL
  Filled 2015-03-27: qty 1

## 2015-03-27 MED ORDER — LIDOCAINE HCL (PF) 1 % IJ SOLN
INTRAMUSCULAR | Status: AC
  Filled 2015-03-27: qty 30

## 2015-03-27 MED ORDER — ACETAMINOPHEN 325 MG PO TABS: 650.0000 mg | ORAL_TABLET | ORAL | Status: DC | PRN

## 2015-03-27 MED ORDER — SODIUM CHLORIDE 0.9 % IJ SOLN
3.0000 mL | Freq: Two times a day (BID) | INTRAMUSCULAR | Status: DC
  Administered 2015-03-27 – 2015-03-28 (×2): 3 mL via INTRAVENOUS

## 2015-03-27 MED ORDER — VERAPAMIL HCL 2.5 MG/ML IV SOLN
INTRA_ARTERIAL | Status: DC | PRN
  Administered 2015-03-27: 10 mL via INTRA_ARTERIAL

## 2015-03-27 MED ORDER — HEPARIN SODIUM (PORCINE) 1000 UNIT/ML IJ SOLN
INTRAMUSCULAR | Status: DC | PRN
  Administered 2015-03-27: 3000 [IU] via INTRAVENOUS

## 2015-03-27 MED ORDER — MIDAZOLAM HCL 2 MG/2ML IJ SOLN
INTRAMUSCULAR | Status: DC | PRN
  Administered 2015-03-27: 2 mg via INTRAVENOUS

## 2015-03-27 MED ORDER — SODIUM CHLORIDE 0.9 % IJ SOLN: 3.0000 mL | INTRAMUSCULAR | Status: DC | PRN

## 2015-03-27 MED ORDER — ONDANSETRON HCL 4 MG/2ML IJ SOLN
INTRAMUSCULAR | Status: AC
  Filled 2015-03-27: qty 2

## 2015-03-27 MED ORDER — ASPIRIN 81 MG PO CHEW
324.0000 mg | CHEWABLE_TABLET | Freq: Once | ORAL | Status: AC
  Administered 2015-03-27: 324 mg via ORAL

## 2015-03-27 MED ORDER — FENOFIBRATE 160 MG PO TABS
160.0000 mg | ORAL_TABLET | Freq: Every day | ORAL | Status: DC
  Administered 2015-03-28: 160 mg via ORAL
  Filled 2015-03-27: qty 1

## 2015-03-27 MED ORDER — NITROGLYCERIN 0.4 MG SL SUBL
SUBLINGUAL_TABLET | SUBLINGUAL | Status: AC
  Filled 2015-03-27: qty 1

## 2015-03-27 MED ORDER — ASPIRIN 81 MG PO CHEW
CHEWABLE_TABLET | ORAL | Status: AC
  Filled 2015-03-27: qty 4

## 2015-03-27 MED ORDER — SODIUM CHLORIDE 0.9 % WEIGHT BASED INFUSION
3.0000 mL/kg/h | INTRAVENOUS | Status: AC
  Administered 2015-03-27: 3 mL/kg/h via INTRAVENOUS

## 2015-03-27 MED ORDER — ASPIRIN EC 81 MG PO TBEC
81.0000 mg | DELAYED_RELEASE_TABLET | Freq: Every day | ORAL | Status: DC
  Administered 2015-03-28: 81 mg via ORAL
  Filled 2015-03-27: qty 1

## 2015-03-27 MED ORDER — HYDROMORPHONE HCL 1 MG/ML IJ SOLN
INTRAMUSCULAR | Status: AC
  Filled 2015-03-27: qty 1

## 2015-03-27 MED ORDER — SODIUM CHLORIDE 0.9 % IV SOLN
INTRAVENOUS | Status: DC
  Administered 2015-03-27: 17:00:00 via INTRAVENOUS
  Administered 2015-03-27: 1000 mL via INTRAVENOUS

## 2015-03-27 MED ORDER — NITROGLYCERIN 1 MG/10 ML FOR IR/CATH LAB
INTRA_ARTERIAL | Status: AC
  Filled 2015-03-27: qty 10

## 2015-03-27 MED ORDER — CANAGLIFLOZIN 100 MG PO TABS
100.0000 mg | ORAL_TABLET | Freq: Every day | ORAL | Status: DC
  Filled 2015-03-27: qty 1

## 2015-03-27 MED ORDER — HEPARIN SODIUM (PORCINE) 1000 UNIT/ML IJ SOLN
INTRAMUSCULAR | Status: AC
  Filled 2015-03-27: qty 1

## 2015-03-27 MED ORDER — MIDAZOLAM HCL 2 MG/2ML IJ SOLN
INTRAMUSCULAR | Status: AC
  Filled 2015-03-27: qty 2

## 2015-03-27 MED ORDER — OMEGA-3-ACID ETHYL ESTERS 1 G PO CAPS
2.0000 g | ORAL_CAPSULE | Freq: Two times a day (BID) | ORAL | Status: DC
  Administered 2015-03-27 – 2015-03-28 (×2): 2 g via ORAL
  Filled 2015-03-27 (×2): qty 2

## 2015-03-27 MED ORDER — MORPHINE SULFATE (PF) 4 MG/ML IV SOLN
INTRAVENOUS | Status: AC
  Administered 2015-03-27: 4 mg
  Filled 2015-03-27: qty 1

## 2015-03-27 MED ORDER — VERAPAMIL HCL 2.5 MG/ML IV SOLN
INTRAVENOUS | Status: AC
  Filled 2015-03-27: qty 2

## 2015-03-27 MED ORDER — HEPARIN SODIUM (PORCINE) 5000 UNIT/ML IJ SOLN
4000.0000 [IU] | INTRAMUSCULAR | Status: AC
  Administered 2015-03-27: 4000 [IU] via INTRAVENOUS

## 2015-03-27 SURGICAL SUPPLY — 9 items
CATH OPTITORQUE TIG 4.0 5F (CATHETERS) ×2 IMPLANT
DEVICE RAD COMP TR BAND LRG (VASCULAR PRODUCTS) ×2 IMPLANT
GLIDESHEATH SLEND A-KIT 6F 20G (SHEATH) ×2 IMPLANT
GUIDEWIRE ANGLED .035X150CM (WIRE) ×1 IMPLANT
KIT HEART LEFT (KITS) ×2 IMPLANT
PACK CARDIAC CATHETERIZATION (CUSTOM PROCEDURE TRAY) ×2 IMPLANT
TRANSDUCER W/STOPCOCK (MISCELLANEOUS) ×2 IMPLANT
TUBING CIL FLEX 10 FLL-RA (TUBING) ×2 IMPLANT
WIRE SAFE-T 1.5MM-J .035X260CM (WIRE) ×2 IMPLANT

## 2015-03-27 NOTE — Telephone Encounter (Signed)
error 

## 2015-03-27 NOTE — ED Notes (Signed)
Translucent Zoll pads placed on patient. Patient placed on Zoll monitor. Patient A&O x 4. Waiting for cath lab to call.

## 2015-03-27 NOTE — ED Notes (Signed)
Cardiologist at bedside.  

## 2015-03-27 NOTE — ED Notes (Signed)
MD at bedside. 

## 2015-03-27 NOTE — H&P (Signed)
[name] is an 69 y.o. female.   Chief Complaint: Chest pain HPI: Patient with history of known diabetes mellitus, hyperlipidemia, history of tobacco use disorder admitted with chest pain that started yesterday associated with shortness of breath. Chest pain is described as heaviness in the middle of the chest, she felt extremely uneasy all day yesterday and evening and could not lay down as she gets short of breath. She also noticed mild diaphoresis. This morning she felt better but again this afternoon started having similar chest pain on and off lasting several minutes and sometimes continuous. Associated with nausea but no vomiting. Due to persistent symptoms he presented to the emergency room.  Past Medical History  Diagnosis Date  . Obesity   . CVA (cerebral infarction)   . Allergy   . Nasal polyps   . Insomnia   . COPD (chronic obstructive pulmonary disease) [facility])     Past Surgical History  Procedure Laterality Date  . Replacement total knee    . Abdominal hysterectomy    . Nasal sinus surgery      No family history on file. Social History:  reports that she has been smoking.  She has never used smokeless tobacco. She reports that she does not drink alcohol or use illicit drugs.  Allergies:  Allergies  Allergen Reactions  . Ibuprofen Shortness Of Breath    REACTION: chest tightness  . Lipitor [Atorvastatin Calcium]     Medications Prior to Admission  Medication Sig Dispense Refill  . aspirin 81 MG EC tablet Take 81 mg by mouth daily.      . fenofibrate 160 MG tablet Take 1 tablet (160 mg total) by mouth daily. 90 tablet 3  . INVOKANA 100 MG TABS tablet TAKE ONE TABLET BY MOUTH ONCE DAILY 90 tablet 0  . losartan (COZAAR) 25 MG tablet Take 1 tablet (25 mg total) by mouth daily. 90 tablet 3  . metFORMIN (GLUCOPHAGE) 500 MG tablet Take 1 tablet (500 mg total) by mouth 2 (two) times daily with a meal. (Patient taking differently: Take 250-500 mg by mouth 2 (two) times  daily with a meal. 258m in the morning; 5071min the evening) 180 tablet 3  . Multiple Vitamins-Minerals (MULTIVITAMIN WITH MINERALS) tablet Take 1 tablet by mouth daily.    . Omega-3 Fatty Acids (FISH OIL) 1200 MG CAPS Take 2 capsules by mouth daily.      . Marland Kitchenolpidem (AMBIEN) 5 MG tablet Take 1 tablet (5 mg total) by mouth at bedtime as needed for sleep. 30 tablet 1  . amoxicillin-clavulanate (AUGMENTIN) 875-125 MG per tablet Take 1 tablet by mouth 2 (two) times daily. (Patient not taking: Reported on 03/27/2015) 20 tablet 0    Results for orders placed or performed during the hospital encounter of 03/27/15 (from the past 48 hour(s))  [facility]stat troponin[facility]    Status: None   Collection Time: 03/27/15  4:32 PM  Result Value Ref Range   Troponin i, poc 0.00 0.00 - 0.08 ng/mL   Comment 3            Comment: Due to the release kinetics of cTnI, a negative result within the first hours of the onset of symptoms does not rule out myocardial infarction with certainty. If myocardial infarction is still suspected, repeat the test at appropriate intervals.   I-stat chem 8, ed     Status: Abnormal   Collection Time: 03/27/15  4:34 PM  Result Value Ref  Range   Sodium 138 135 - 145 mmol/L   Potassium 4.0 3.5 - 5.1 mmol/L   Chloride 102 101 - 111 mmol/L   BUN 18 6 - 20 mg/dL   Creatinine, Ser 0.70 0.44 - 1.00 mg/dL   Glucose, Bld 185 (H) 65 - 99 mg/dL   Calcium, Ion 1.10 (L) 1.13 - 1.30 mmol/L   TCO2 22 0 - 100 mmol/L   Hemoglobin 17.7 (H) 12.0 - 15.0 g/dL   HCT 52.0 (H) 36.0 - 78.6 %  Basic metabolic panel     Status: Abnormal   Collection Time: 03/27/15  4:36 PM  Result Value Ref Range   Sodium 135 135 - 145 mmol/L   Potassium 3.9 3.5 - 5.1 mmol/L   Chloride 102 101 - 111 mmol/L   CO2 24 22 - 32 mmol/L   Glucose, Bld 185 (H) 65 - 99 mg/dL   BUN 15 6 - 20 mg/dL   Creatinine, Ser 0.85 0.44 - 1.00 mg/dL   Calcium 9.3 8.9 - 10.3 mg/dL   GFR calc non Af Amer >60 >60 mL/min    GFR calc Af Amer >60 >60 mL/min    Comment: (NOTE) The eGFR has been calculated using the CKD EPI equation. This calculation has not been validated in all clinical situations. eGFR's persistently <60 mL/min signify possible Chronic Kidney Disease.    Anion gap 9 5 - 15  APTT     Status: None   Collection Time: 03/27/15  4:36 PM  Result Value Ref Range   aPTT 31 24 - 37 seconds  Protime-INR     Status: None   Collection Time: 03/27/15  4:36 PM  Result Value Ref Range   Prothrombin Time 14.7 11.6 - 15.2 seconds   INR 1.13 0.00 - 1.49   No results found.  Review of Systems  Constitutional: Negative for fever and chills.  Eyes: Negative for blurred vision.  Respiratory: Positive for shortness of breath. Negative for cough and hemoptysis.   Cardiovascular: Positive for chest pain and orthopnea. Negative for palpitations, claudication, leg swelling and PND.  Gastrointestinal: Positive for nausea. Negative for heartburn, vomiting, abdominal pain and diarrhea.  Genitourinary: Negative for dysuria.  Neurological: Negative for dizziness.  Endo/Heme/Allergies: Does not bruise/bleed easily.  Psychiatric/Behavioral: Negative for depression.    Blood pressure 134/85, pulse 106, temperature 98 F (36.7 C), temperature source Oral, resp. rate 26, height '5\' 4"'  (1.626 m), weight 90.719 kg (200 lb), SpO2 97 %. Physical Exam  Constitutional: She is oriented to person, place, and time. She appears well-nourished. No distress.  Eyes: Conjunctivae are normal.  Neck: No tracheal deviation present. No thyromegaly present.  Cardiovascular: Normal rate and normal heart sounds.   Respiratory: Effort normal. No stridor.  GI: Soft. She exhibits no distension. There is no tenderness.  Musculoskeletal: Normal range of motion.  Neurological: She is alert and oriented to person, place, and time.  Skin: Skin is warm. She is not diaphoretic.  Psychiatric: She has a normal mood and affect.     3 Assessment/Plan 1. Chest pain suggestive of acute coronary syndrome, EKG revealing ST segment 1 mm elevation in the inferior leads and lateral leads without reciprocal changes, chest pain also has pleuritic component. Cannot exclude  This. Physical examination is otherwise unremarkable. 2. Diabetes mellitus type 2 controlled 3. Hyperlipidemia 4. History of tobacco use disorder   recommendation: Patient to be emergently taken to the cardiac catheterization lab to eval her coronary anatomy. Further recommendations will follow.  Adrian Prows 03/27/2015, 5:10 PM

## 2015-03-27 NOTE — Telephone Encounter (Signed)
Pt called chest tightness. Up all night, could not sleep, sob, upper body hurts, painful to breathe.  She doesn't know if it is her chest or her stomach.  Dr. Susann GivensLalonde advised her to go to the ER.  She will do so.

## 2015-03-27 NOTE — ED Notes (Signed)
Cath lab ready. Transferring patient now.

## 2015-03-27 NOTE — ED Notes (Signed)
Pt reports yesterday she began to have mid to R sided cp and epigastric pain causing her to be sob. Pt sts sob gets worse when laying down. Pain also increases with inspiration

## 2015-03-27 NOTE — ED Provider Notes (Signed)
CSN: [id number]     Arrival date & time 03/27/15  1616 History   First MD Initiated Contact with Patient 03/27/15 1636     Chief Complaint  Patient presents with  . Chest Pain     (Consider location/radiation/quality/duration/timing/severity/associated sxs/prior Treatment) HPI Comments: 69 y.o. Female with history of DM , hyperlipidemia, HTN presents for chest pain.  The patient states that she started having chest discomfort since yesterday that was pressure like and over the last few hours the pain has gotten significantly worse.  She also has had associated diaphoresis and shortness of breath.  Denies symptoms like this before.  No cough or fever.    Patient is a 69 y.o. female presenting with chest pain.  Chest Pain Associated symptoms: diaphoresis and shortness of breath   Associated symptoms: no abdominal pain, no back pain, no cough, no dizziness, no fever, no headache, no palpitations, not vomiting and no weakness     Past Medical History  Diagnosis Date  . Obesity   . CVA (cerebral infarction)   . Allergy   . Nasal polyps   . Insomnia   . COPD (chronic obstructive pulmonary disease) [facility])    Past Surgical History  Procedure Laterality Date  . Replacement total knee    . Abdominal hysterectomy    . Nasal sinus surgery     No family history on file. Social History  Substance Use Topics  . Smoking status: Current Some Day Smoker  . Smokeless tobacco: Never Used  . Alcohol Use: No   OB History    No data available     Review of Systems  Constitutional: Positive for diaphoresis. Negative for fever, chills and appetite change.  HENT: Negative for congestion, rhinorrhea and sinus pressure.   Respiratory: Positive for shortness of breath. Negative for cough, chest tightness and wheezing.   Cardiovascular: Positive for chest pain. Negative for palpitations and leg swelling.  Gastrointestinal: Negative for vomiting, abdominal pain, diarrhea and constipation.   Genitourinary: Negative for dysuria, urgency and frequency.  Musculoskeletal: Negative for myalgias and back pain.  Skin: Negative for rash.  Neurological: Negative for dizziness, weakness, light-headedness and headaches.  Hematological: Does not bruise/bleed easily.      Allergies  Ibuprofen and Lipitor  Home Medications   Prior to Admission medications   Medication Sig Start Date End Date Taking? Authorizing Provider  aspirin 81 MG EC tablet Take 81 mg by mouth daily.     Yes Historical Provider, MD  fenofibrate 160 MG tablet Take 1 tablet (160 mg total) by mouth daily. 08/03/14  Yes Ronnald Nian, MD  INVOKANA 100 MG TABS tablet TAKE ONE TABLET BY MOUTH ONCE DAILY 02/01/15  Yes Ronnald Nian, MD  losartan (COZAAR) 25 MG tablet Take 1 tablet (25 mg total) by mouth daily. 08/03/14  Yes Ronnald Nian, MD  metFORMIN (GLUCOPHAGE) 500 MG tablet Take 1 tablet (500 mg total) by mouth 2 (two) times daily with a meal. Patient taking differently: Take 250-500 mg by mouth 2 (two) times daily with a meal.  in the morning;  in the evening 12/06/14  Yes Ronnald Nian, MD  Multiple Vitamins-Minerals (MULTIVITAMIN WITH MINERALS) tablet Take 1 tablet by mouth daily.   Yes Historical Provider, MD  Omega-3 Fatty Acids (FISH OIL) 1200 MG CAPS Take 2 capsules by mouth daily.     Yes Historical Provider, MD  zolpidem (AMBIEN) 5 MG tablet Take 1 tablet (5 mg total) by mouth at bedtime as needed  for sleep. 08/03/14  Yes Ronnald NianJohn C Lalonde, MD  amoxicillin-clavulanate (AUGMENTIN) 875-125 MG per tablet Take 1 tablet by mouth 2 (two) times daily. Patient not taking: Reported on 03/27/2015 12/06/14   Ronnald NianJohn C Lalonde, MD   BP 120/94 mmHg  Pulse 107  Temp(Src) 98 F (36.7 C) (Oral)  Resp 18  Ht 5\' 4"  (1.626 m)  Wt 200 lb (90.719 kg)  BMI 34.31 kg/m2  SpO2 97% Physical Exam  Constitutional: She is oriented to person, place, and time. She appears well-developed and well-nourished. No distress.  HENT:   Head: Normocephalic and atraumatic.  Right Ear: External ear normal.  Left Ear: External ear normal.  Nose: Nose normal.  Mouth/Throat: Oropharynx is clear and moist. No oropharyngeal exudate.  Eyes: EOM are normal. Pupils are equal, round, and reactive to light.  Neck: Normal range of motion. Neck supple.  Cardiovascular: Normal rate, regular rhythm, normal heart sounds and intact distal pulses.   No murmur heard. Pulmonary/Chest: Effort normal. No respiratory distress. She has no wheezes. She has no rales.  Abdominal: Soft. She exhibits no distension. There is no tenderness.  Musculoskeletal: Normal range of motion. She exhibits no edema or tenderness.  Neurological: She is alert and oriented to person, place, and time.  Skin: Skin is warm and dry. No rash noted. She is not diaphoretic.  Vitals reviewed.   ED Course  Procedures (including critical care time) Labs Review Labs Reviewed  I-STAT CHEM 8, ED - Abnormal; Notable for the following:    Glucose, Bld 185 (*)    Calcium, Ion 1.10 (*)    Hemoglobin 17.7 (*)    HCT 52.0 (*)    All other components within normal limits  BASIC METABOLIC PANEL  CBC  APTT  PROTIME-INR  I-STAT TROPOININ, ED  I-STAT TROPOININ, ED    Imaging Review No results found. I have personally reviewed and evaluated these images and lab results as part of my medical decision-making.   EKG Interpretation   Date/Time:  Wednesday March 27 2015 16:26:01 EST Ventricular Rate:  109 PR Interval:  156 QRS Duration: 78 QT Interval:  332 QTC Calculation: 447 R Axis:   84 Text Interpretation:   Critical Test Result: STEMI Sinus tachycardia  Low voltage QRS ST elevation consider inferior injury or acute infarct  ACUTE MI / STEMI Abnormal ECG ST elevation in inferior leads new  since previous tracings Confirmed by Danyal Adorno ([id number]) on 03/27/2015  4:30:43 PM      MDM  EKG signed from triage and concern for possible STEMI but wander present and  so repeat done which showed again elevation in the inferior leads.  Patient with active chest pain.  Risk factors.  Code STEMI paged.  Patient given ASA, morphine, IV fluids, heparin, pads placed.  Discussed with Dr. Jacinto HalimGanji on the phone who agreed with STEMI activation and the cath lab was activated.  Patient and family updated on results, clinical impression and plan of care.  Patient taken to the cath lab for possible cardiac intervention.  Final diagnoses:  None    1. STEMI    Leta BaptistEmily Roe Rashea Hoskie, MD 03/27/15 [phone]

## 2015-03-27 NOTE — ED Notes (Signed)
Code Stemi calle @1633 

## 2015-03-28 ENCOUNTER — Observation Stay (HOSPITAL_COMMUNITY): Payer: Medicare Other

## 2015-03-28 ENCOUNTER — Encounter (HOSPITAL_COMMUNITY): Payer: Self-pay | Admitting: Radiology

## 2015-03-28 DIAGNOSIS — I313 Pericardial effusion (noninflammatory): Secondary | ICD-10-CM | POA: Diagnosis not present

## 2015-03-28 DIAGNOSIS — R0789 Other chest pain: Secondary | ICD-10-CM | POA: Diagnosis not present

## 2015-03-28 DIAGNOSIS — R61 Generalized hyperhidrosis: Secondary | ICD-10-CM | POA: Diagnosis not present

## 2015-03-28 DIAGNOSIS — R0602 Shortness of breath: Secondary | ICD-10-CM | POA: Diagnosis not present

## 2015-03-28 LAB — TROPONIN I: Troponin I: <0.03 ng/mL (ref <0.031)

## 2015-03-28 LAB — GLUCOSE, CAPILLARY: Glucose-Capillary: 192 mg/dL — ABNORMAL HIGH (ref 65–99)

## 2015-03-28 LAB — HEMOGLOBIN A1C
Hgb A1c MFr Bld: 7.2 % — ABNORMAL HIGH (ref 4.8–5.6)
Mean Plasma Glucose: 160 mg/dL

## 2015-03-28 MED ORDER — IOHEXOL 350 MG/ML SOLN
100.0000 mL | Freq: Once | INTRAVENOUS | Status: AC | PRN
  Administered 2015-03-28: 100 mL via INTRAVENOUS

## 2015-03-28 MED ORDER — TRAMADOL HCL 50 MG PO TABS: 50.0000 mg | ORAL_TABLET | Freq: Four times a day (QID) | ORAL | Status: DC | PRN

## 2015-03-28 MED ORDER — PANTOPRAZOLE SODIUM 40 MG PO TBEC: 40.0000 mg | DELAYED_RELEASE_TABLET | Freq: Every day | ORAL | Status: DC

## 2015-03-28 NOTE — Plan of Care (Signed)
Problem: Phase I Progression Outcomes Goal: Hemodynamically stable Outcome: Completed/Met Date Met:  03/28/15 Patient VSS with right radial incision clean, dry, intact with good pulses and level 0 throughout her checks

## 2015-03-28 NOTE — Plan of Care (Signed)
Problem: Safety: Goal: Ability to remain free from injury will improve Outcome: Completed/Met Date Met:  03/28/15 Patient was instructed to use her call light and to call me via number on white board if she needs anything, personal belongings on bedside table and all things are within her reach. Patient verbalized understanding and was able to tell me how she would get a hold of me and that she was not to get OOB without calling for assistance

## 2015-03-28 NOTE — Plan of Care (Signed)
Problem: Phase I Progression Outcomes Goal: Vascular site scale level 0 - I Vascular Site Scale Level 0: No bruising/bleeding/hematoma Level I (Mild): Bruising/Ecchymosis, minimal bleeding/ooozing, palpable hematoma < 3 cm Level II (Moderate): Bleeding not affecting hemodynamic parameters, pseudoaneurysm, palpable hematoma > 3 cm Level III (Severe) Bleeding which affects hemodynamic parameters or retroperitoneal hemorrhage  Outcome: Progressing Patient's right radial are remains clean, dry and intact with dressing and is level 0 throughout frequent assessments

## 2015-03-28 NOTE — Plan of Care (Signed)
Problem: Phase I Progression Outcomes Goal: Pain controlled with appropriate interventions Outcome: Progressing Patient is still having pain in her diaphragm rated 4/10 but is able to take deeper breaths and is refusing anything for pain, will continue to monitor

## 2015-03-28 NOTE — Discharge Instructions (Signed)
Radial Site Care °Refer to this sheet in the next few weeks. These instructions provide you with information about caring for yourself after your procedure. Your health care provider may also give you more specific instructions. Your treatment has been planned according to current medical practices, but problems sometimes occur. Call your health care provider if you have any problems or questions after your procedure. °WHAT TO EXPECT AFTER THE PROCEDURE °After your procedure, it is typical to have the following: °· Bruising at the radial site that usually fades within 1-2 weeks. °· Blood collecting in the tissue (hematoma) that may be painful to the touch. It should usually decrease in size and tenderness within 1-2 weeks. °HOME CARE INSTRUCTIONS °· Take medicines only as directed by your health care provider. °· You may shower 24-48 hours after the procedure or as directed by your health care provider. Remove the bandage (dressing) and gently wash the site with plain soap and water. Pat the area dry with a clean towel. Do not rub the site, because this may cause bleeding. °· Do not take baths, swim, or use a hot tub until your health care provider approves. °· Check your insertion site every day for redness, swelling, or drainage. °· Do not apply powder or lotion to the site. °· Do not flex or bend the affected arm for 24 hours or as directed by your health care provider. °· Do not push or pull heavy objects with the affected arm for 24 hours or as directed by your health care provider. °· Do not lift over 10 lb (4.5 kg) for 5 days after your procedure or as directed by your health care provider. °· Ask your health care provider when it is okay to: °¨ Return to work or school. °¨ Resume usual physical activities or sports. °¨ Resume sexual activity. °· Do not drive home if you are discharged the same day as the procedure. Have someone else drive you. °· You may drive 24 hours after the procedure unless otherwise  instructed by your health care provider. °· Do not operate machinery or power tools for 24 hours after the procedure. °· If your procedure was done as an outpatient procedure, which means that you went home the same day as your procedure, a responsible adult should be with you for the first 24 hours after you arrive home. °· Keep all follow-up visits as directed by your health care provider. This is important. °SEEK MEDICAL CARE IF: °· You have a fever. °· You have chills. °· You have increased bleeding from the radial site. Hold pressure on the site. °SEEK IMMEDIATE MEDICAL CARE IF: °· You have unusual pain at the radial site. °· You have redness, warmth, or swelling at the radial site. °· You have drainage (other than a small amount of blood on the dressing) from the radial site. °· The radial site is bleeding, and the bleeding does not stop after 30 minutes of holding steady pressure on the site. °· Your arm or hand becomes pale, cool, tingly, or numb. °  °This information is not intended to replace advice given to you by your health care provider. Make sure you discuss any questions you have with your health care provider. °  °Document Released: 05/23/2010 Document Revised: 05/11/2014 Document Reviewed: 11/06/2013 °Elsevier Interactive Patient Education ©2016 [employer] ° °

## 2015-03-28 NOTE — Plan of Care (Signed)
Problem: Phase I Progression Outcomes Goal: Voiding-avoid urinary catheter unless indicated Outcome: Completed/Met Date Met:  03/28/15 Patient has voided when I assessed her this evening with no problems

## 2015-03-28 NOTE — Progress Notes (Signed)
Pt educated on Radial site care. All discharge instructions discussed. IV removed

## 2015-03-28 NOTE — Progress Notes (Signed)
Patient has a right radial incision and TB band was removed and gauze dressing app[lied with transparent dressing over it, site was clean, dry, soft with no signs or symptoms of complications and VSS, will call for transport and send down for CXR ordered by MD, patient has no complaints

## 2015-03-28 NOTE — Plan of Care (Signed)
Problem: Phase I Progression Outcomes Goal: Distal pulses equal to baseline Outcome: Completed/Met Date Met:  03/28/15 Patient has had 2+ pulses to her right radial area and her TB band was able to be deflated and removed successfully with no complications

## 2015-03-28 NOTE — Progress Notes (Signed)
Report received at patient's bedside via Jess RN using SBAR format, reviewed chart, orders, labs, VS, meds and patient's general condtion, will assume care of patient.

## 2015-03-28 NOTE — Plan of Care (Signed)
Problem: Phase I Progression Outcomes Goal: Post Cath/PCI return to appropriate Path Outcome: Progressing Patient VSS, right radial site with dressing remains clean, dry, soft and intact. Patient still having some discomfort from upper abdomen to diaphragm area with deep breaths but is beginning to resolve as per patient, will continue to monitor

## 2015-03-28 NOTE — Discharge Summary (Addendum)
Physician Discharge Summary  Patient ID: BRYLEA PITA MRN: [id number] DOB/AGE: Apr 05, 1946 69 y.o.  Admit date: 03/27/2015 Discharge date: 03/28/2015  Discharge Diagnoses: 1. Chest pain, likely pleuritic, mild pericardial effusion noted by CT and ST elevation in the EKG without reciprocle changes suggests acute pericarditis. 2. Type 2 Diabetes Mellitus 3. Hyperlipidemia 4. History of tobacco use disorder 5. Thyroid nodule and needs further work-up.  Significant Diagnostic Studies:   Coronary Angiogram 03/27/2015:  Mid RCA lesion, 40% stenosed.  Mid LAD lesion, 10% stenosed.  The left ventricular systolic function is normal.   CT scan of the chest 03/28/2015: Small pericardial effusion.  No evidence of pulmonary emboli.  Possible left thyroid nodule. Nonemergent ultrasound is recommended for further evaluation  Hospital Course:  She has a history of known diabetes mellitus, hyperlipidemia, and history of tobacco use disorder. She was admitted yesterday with chest pain that started Tuesday associated with shortness of breath. Chest pain was described as heaviness in the middle of the chest and could not lay down as she gets short of breath. She also noticed mild diaphoresis. Also associated with nausea but no vomiting. Due to persistent symptoms she presented to the emergency room. EKG revealed ST segment 1 mm elevation in the inferior leads and lateral leads without reciprocal changes. Due to symptoms and abnormal EKG, patient was emergently taken to the cardiac catheterization lab to evaluate her coronary anatomy. Coronary angiogram revealed only mild, nonobstructive CAD.  This morning, she reports resolution of midsternal and epigastric chest pain, continues to have left chest wall pain, just below ribs, worse with deep breathing and coughing. Also complained of dyspnea that is more than usual. No new symptoms or concerns this morning.   Due to concerns of possible  pulmonary embolism with pleuritic type of chest pain, CT scan of the chest was performed, which revealed small pericardial effusion, no evidence of pulmonary embolism. Has a thyroid nodule that needs dedicated ultrasound study, patient has been advised to follow-up with her PCP for further management. Due to questionable history of NSAID allergy, no NSAIDs were prescribed. She will use Ultram on appearance basis for the next 3-4 days. Smoking cessation discussed extensively.  Recommendations on discharge: EKG unchanged from yesterday, delta troponin remained negative.  CXR reveals bibasilar atelectasis. Suspect combination of GERD and respiratory etiology; acute COPD exacerbation vs early infectious process. As she is afebrile with normal white count and normal lung sounds, recommend following up with PCP regarding further management.    Has a thyroid nodule that needs dedicated ultrasound study, patient has been advised to follow-up with her PCP for further management.   Discharge Exam: Blood pressure 121/67, pulse 103, temperature 98.8 F (37.1 C), temperature source Oral, resp. rate 18, height  (1.626 m), weight 94.348 kg (208 lb), SpO2 94 %.    Constitutional: She is oriented to person, place, and time. She appears well-nourished. No distress.  Eyes: Conjunctivae are normal.  Neck: No tracheal deviation present. No thyromegaly present.  Cardiovascular: Normal rate and normal heart sounds. Right radial access site asymptomatic.  Respiratory: Effort normal. No stridor.  GI: Soft. She exhibits no distension. There is no tenderness.  Musculoskeletal: Normal range of motion.  Neurological: She is alert and oriented to person, place, and time.  Skin: Skin is warm. She is not diaphoretic.  Psychiatric: She has a normal mood and affect.   Labs:   Lab Results  Component Value Date   WBC 10.7* 03/27/2015   HGB 15.3* 03/27/2015  HCT 46.2* 03/27/2015   MCV 89.9 03/27/2015   PLT 165  03/27/2015    Recent Labs Lab 03/27/15 1636  NA 135  K 3.9  CL 102  CO2 24  BUN 15  CREATININE 0.85  CALCIUM 9.3  GLUCOSE 185*    Lipid Panel     Component Value Date/Time   CHOL 195 03/27/2014 1015   TRIG 252* 03/27/2014 1015   HDL 41 03/27/2014 1015   CHOLHDL 4.8 03/27/2014 1015   VLDL 50* 03/27/2014 1015   LDLCALC 104* 03/27/2014 1015    HEMOGLOBIN A1C Lab Results  Component Value Date   HGBA1C 7.2* 03/27/2015   MPG 160 03/27/2015    Cardiac Panel (last 3 results)  Recent Labs  03/27/15 1835 03/27/15 2312 03/28/15 0546  TROPONINI <0.03 <0.03 <0.03    Lab Results  Component Value Date   CKTOTAL 144 04/01/2010   CKMB 2.0 04/01/2010   TROPONINI <0.03 03/28/2015    EKG: 03/28/2015 unchanged from previous tracings, normal sinus rhythm, minimal ST elevation in the inferior lead without reciprocal ST segment depressions. Unchanged from 03/27/2015.   Radiology: Dg Chest 2 View  03/27/2015  CLINICAL DATA:  Chest pain with shortness of breath earlier today. Post catheterization. EXAM: CHEST  2 VIEW COMPARISON:  03/31/2010 FINDINGS: Shallow inspiration with linear atelectasis in the lung bases. Normal heart size and pulmonary vascularity. No focal airspace disease or consolidation in the lungs. No blunting of costophrenic angles. No pneumothorax. Mediastinal contours appear intact. Degenerative changes in the spine. IMPRESSION: Shallow inspiration with atelectasis in the lung bases. Electronically Signed   By: Burman Nieves M.D.   On: 03/27/2015 22:51   Ct Angio Chest Pe W/cm &/or Wo Cm  03/28/2015  CLINICAL DATA:  Midsternal chest pain EXAM: CT ANGIOGRAPHY CHEST WITH CONTRAST TECHNIQUE: Multidetector CT imaging of the chest was performed using the standard protocol during bolus administration of intravenous contrast. Multiplanar CT image reconstructions and MIPs were obtained to evaluate the vascular anatomy. CONTRAST:  OMNIPAQUE IOHEXOL 350 MG/ML SOLN  COMPARISON:  None. FINDINGS: Lungs are well aerated bilaterally. Very minimal basilar atelectasis is seen. No focal confluent infiltrate is noted. The thoracic inlet demonstrates prominence of the left thyroid gland with apparent hypodense nodule within. The thoracic aorta and its branches are within normal limits. Pulmonary artery is well visualized and within normal limits. No filling defects to suggest pulmonary emboli are identified. Minimal coronary calcifications are noted. A small pericardial effusion is noted. No hilar or mediastinal adenopathy is noted. Scanning into the upper abdomen reveals no acute abnormality. Bony structures are within normal limits. Review of the MIP images confirms the above findings. IMPRESSION: Small pericardial effusion. No evidence of pulmonary emboli. Possible left thyroid nodule. Nonemergent ultrasound is recommended for further evaluation Electronically Signed   By: Alcide Clever M.D.   On: 03/28/2015 10:28    FOLLOW UP PLANS AND APPOINTMENTS To f/u with Drs. Ramaswamy and Ackworth.    Medication List    STOP taking these medications        amoxicillin-clavulanate 875-125 MG tablet  Commonly known as:  AUGMENTIN      TAKE these medications        aspirin 81 MG EC tablet  Take 81 mg by mouth daily.     fenofibrate 160 MG tablet  Take 1 tablet (160 mg total) by mouth daily.     Fish Oil 1200 MG Caps  Take 2 capsules by mouth daily.     INVOKANA  100 MG Tabs tablet  Generic drug:  canagliflozin  TAKE ONE TABLET BY MOUTH ONCE DAILY     losartan 25 MG tablet  Commonly known as:  COZAAR  Take 1 tablet (25 mg total) by mouth daily.     metFORMIN 500 MG tablet  Commonly known as:  GLUCOPHAGE  Take 1 tablet (500 mg total) by mouth 2 (two) times daily with a meal.     multivitamin with minerals tablet  Take 1 tablet by mouth daily.     pantoprazole 40 MG tablet  Commonly known as:  PROTONIX  Take 1 tablet (40 mg total) by mouth daily before  breakfast.     traMADol 50 MG tablet  Commonly known as:  ULTRAM  Take 1 tablet (50 mg total) by mouth every 6 (six) hours as needed for moderate pain.     zolpidem 5 MG tablet  Commonly known as:  AMBIEN  Take 1 tablet (5 mg total) by mouth at bedtime as needed for sleep.        Erling Contellison,Bridgette Nicole, NP-C 03/28/2015, 8:28 AM [facility] Pager: [phone] Office: [phone]

## 2015-03-29 ENCOUNTER — Encounter (HOSPITAL_COMMUNITY): Payer: Self-pay | Admitting: Cardiology

## 2015-03-31 ENCOUNTER — Telehealth: Payer: Self-pay | Admitting: Pulmonary Disease

## 2015-03-31 ENCOUNTER — Encounter: Payer: Self-pay | Admitting: Family Medicine

## 2015-03-31 NOTE — Telephone Encounter (Signed)
Called by Arleene d/t SOB and chest pain. PMH of COPD and recent hospital admission and discharge on Thanksgiving Day for similar symptoms. Work up revealed non critical CAD, a mild pericardial effusion by CT Scan and ST elevation on EKG without reciprocal changes which suggested acute pericarditis. Patient is allergic to Ibuprofen, therefore, I can't recommend Ibuprofen. Concern for further pericardial fluid collection vs ongoing pericarditis. Unable to exam patient for informed decision making. Recommended that the patient go to Emergency Department for further w/u and treatment.

## 2015-04-01 ENCOUNTER — Telehealth: Payer: Self-pay | Admitting: Family Medicine

## 2015-04-01 NOTE — Telephone Encounter (Signed)
Pt called to make a hosp follow up appt for today. She said she need to see Dr Susann GivensLalonde today because she is still having some trouble breathing even though it is a little better after being in the hospital. Pt said she was in the hospital for heart issues and wants Dr Susann GivensLalonde to look over her records. She was not sent home with any meds and pt is concerned about this since she is still having breathing issues. No appts available with Dr Susann GivensLalonde today so pt was willing to wait until tomorrow. Is this ok for pt to wait until tomorrow for appt?

## 2015-04-01 NOTE — Telephone Encounter (Signed)
Scheduled pt for tomorrow.

## 2015-04-01 NOTE — Telephone Encounter (Signed)
I'm okay with this.

## 2015-04-02 ENCOUNTER — Ambulatory Visit (INDEPENDENT_AMBULATORY_CARE_PROVIDER_SITE_OTHER): Payer: Medicare Other | Admitting: Family Medicine

## 2015-04-02 ENCOUNTER — Encounter: Payer: Self-pay | Admitting: Family Medicine

## 2015-04-02 VITALS — BP 100/60 | HR 114 | Ht 64.0 in | Wt 208.0 lb

## 2015-04-02 DIAGNOSIS — I3139 Other pericardial effusion (noninflammatory): Secondary | ICD-10-CM

## 2015-04-02 DIAGNOSIS — E118 Type 2 diabetes mellitus with unspecified complications: Secondary | ICD-10-CM

## 2015-04-02 DIAGNOSIS — I319 Disease of pericardium, unspecified: Secondary | ICD-10-CM | POA: Diagnosis not present

## 2015-04-02 DIAGNOSIS — I313 Pericardial effusion (noninflammatory): Secondary | ICD-10-CM

## 2015-04-02 MED ORDER — PREDNISONE 10 MG (21) PO TBPK: 10.0000 mg | ORAL_TABLET | Freq: Every day | ORAL | Status: DC

## 2015-04-02 MED ORDER — METFORMIN HCL 500 MG PO TABS: 500.0000 mg | ORAL_TABLET | Freq: Two times a day (BID) | ORAL | Status: DC

## 2015-04-02 NOTE — Progress Notes (Signed)
   Subjective:    Patient ID: [name], female    DOB: Nov 15, 1945, 69 y.o.   MRN: [id number]  HPI She is here for follow-up visit after recent ER visit. She was having difficulty with chest tightness. She was seen by Dr. Jacinto HalimGanji and he did a catheterization which showed minimal disease. Chest CT did show pericardial effusion. She is still having some intermittent difficulty with chest tightness. She does have a trip planned to [place] and will like this taken care of as quickly as possible. She has had difficulty with ibuprofen in the past and recently took it again and had trouble again with presyncopal symptoms. She has used Aleve but not with any regularity. She would like to be given steroids to help with this. She also is had difficulty with her blood sugars dropping low and presently is taking metformin twice a day which apparently is mitigating low blood sugars.   Review of Systems     Objective:   Physical Exam alert and in no distress otherwise not examined. The ER record as well as discharge summary was reviewed      Assessment & Plan:  Pericardial effusion - Plan: predniSONE (STERAPRED UNI-PAK 21 TAB) 10 MG (21) TBPK tablet  Type 2 diabetes mellitus with complication, without long-term current use of insulin (HCC) - Plan: metFORMIN (GLUCOPHAGE) 500 MG tablet  I explained that I would rather not do prednisone and have her on an NSAID however she recognizes the risks involved and would rather have this to help with her effusion. This will allow her to have a trip to [place] without difficulty.

## 2015-04-04 ENCOUNTER — Encounter: Payer: Self-pay | Admitting: Family Medicine

## 2015-04-04 ENCOUNTER — Other Ambulatory Visit: Payer: Self-pay

## 2015-04-05 ENCOUNTER — Other Ambulatory Visit: Payer: Self-pay | Admitting: Family Medicine

## 2015-04-05 MED ORDER — ALBUTEROL SULFATE HFA 108 (90 BASE) MCG/ACT IN AERS: 2.0000 | INHALATION_SPRAY | Freq: Four times a day (QID) | RESPIRATORY_TRACT | Status: DC | PRN

## 2015-04-10 ENCOUNTER — Ambulatory Visit: Payer: Medicare Other | Admitting: Family Medicine

## 2015-05-08 ENCOUNTER — Other Ambulatory Visit: Payer: Self-pay | Admitting: Family Medicine

## 2015-08-05 ENCOUNTER — Telehealth: Payer: Self-pay | Admitting: Internal Medicine

## 2015-08-05 ENCOUNTER — Ambulatory Visit (INDEPENDENT_AMBULATORY_CARE_PROVIDER_SITE_OTHER): Payer: Medicare Other | Admitting: Family Medicine

## 2015-08-05 ENCOUNTER — Encounter: Payer: Self-pay | Admitting: Family Medicine

## 2015-08-05 VITALS — BP 124/60 | HR 64 | Ht 64.0 in | Wt 204.0 lb

## 2015-08-05 DIAGNOSIS — Z789 Other specified health status: Secondary | ICD-10-CM

## 2015-08-05 DIAGNOSIS — E669 Obesity, unspecified: Secondary | ICD-10-CM

## 2015-08-05 DIAGNOSIS — Z1159 Encounter for screening for other viral diseases: Secondary | ICD-10-CM

## 2015-08-05 DIAGNOSIS — E1159 Type 2 diabetes mellitus with other circulatory complications: Secondary | ICD-10-CM | POA: Diagnosis not present

## 2015-08-05 DIAGNOSIS — Z720 Tobacco use: Secondary | ICD-10-CM | POA: Diagnosis not present

## 2015-08-05 DIAGNOSIS — Z0000 Encounter for general adult medical examination without abnormal findings: Secondary | ICD-10-CM

## 2015-08-05 DIAGNOSIS — E118 Type 2 diabetes mellitus with unspecified complications: Secondary | ICD-10-CM | POA: Diagnosis not present

## 2015-08-05 DIAGNOSIS — I10 Essential (primary) hypertension: Secondary | ICD-10-CM | POA: Diagnosis not present

## 2015-08-05 DIAGNOSIS — Z79899 Other long term (current) drug therapy: Secondary | ICD-10-CM | POA: Diagnosis not present

## 2015-08-05 DIAGNOSIS — E785 Hyperlipidemia, unspecified: Secondary | ICD-10-CM | POA: Diagnosis not present

## 2015-08-05 DIAGNOSIS — H7290 Unspecified perforation of tympanic membrane, unspecified ear: Secondary | ICD-10-CM

## 2015-08-05 DIAGNOSIS — E1169 Type 2 diabetes mellitus with other specified complication: Secondary | ICD-10-CM | POA: Diagnosis not present

## 2015-08-05 DIAGNOSIS — H7291 Unspecified perforation of tympanic membrane, right ear: Secondary | ICD-10-CM

## 2015-08-05 DIAGNOSIS — I152 Hypertension secondary to endocrine disorders: Secondary | ICD-10-CM

## 2015-08-05 LAB — POCT GLYCOSYLATED HEMOGLOBIN (HGB A1C): Hemoglobin A1C: 6.5

## 2015-08-05 LAB — COMPREHENSIVE METABOLIC PANEL
ALT: 36 U/L — ABNORMAL HIGH (ref 6–29)
AST: 51 U/L — ABNORMAL HIGH (ref 10–35)
Albumin: 4.3 g/dL (ref 3.6–5.1)
Alkaline Phosphatase: 39 U/L (ref 33–130)
BUN: 20 mg/dL (ref 7–25)
CO2: 25 mmol/L (ref 20–31)
Calcium: 9.2 mg/dL (ref 8.6–10.4)
Chloride: 102 mmol/L (ref 98–110)
Creat: 0.72 mg/dL (ref 0.50–0.99)
Glucose, Bld: 112 mg/dL — ABNORMAL HIGH (ref 65–99)
Potassium: 4.7 mmol/L (ref 3.5–5.3)
Sodium: 137 mmol/L (ref 135–146)
Total Bilirubin: 0.6 mg/dL (ref 0.2–1.2)
Total Protein: 7.6 g/dL (ref 6.1–8.1)

## 2015-08-05 LAB — POCT UA - MICROALBUMIN
Albumin/Creatinine Ratio, Urine, POC: 9.3
Creatinine, POC: 79.6 mg/dL
Microalbumin Ur, POC: 7.4 mg/L

## 2015-08-05 LAB — POCT URINALYSIS DIPSTICK
Bilirubin, UA: NEGATIVE
Blood, UA: NEGATIVE
Ketones, UA: NEGATIVE
Leukocytes, UA: NEGATIVE
Nitrite, UA: NEGATIVE
Protein, UA: NEGATIVE
Spec Grav, UA: >=1.03
Urobilinogen, UA: NEGATIVE
pH, UA: 6

## 2015-08-05 LAB — CBC WITH DIFFERENTIAL/PLATELET
Basophils Absolute: 57 cells/uL (ref 0–200)
Basophils Relative: 1 %
Eosinophils Absolute: 228 cells/uL (ref 15–500)
Eosinophils Relative: 4 %
HCT: 47.6 % — ABNORMAL HIGH (ref 35.0–45.0)
Hemoglobin: 15.8 g/dL — ABNORMAL HIGH (ref 11.7–15.5)
Lymphocytes Relative: 41 %
Lymphs Abs: 2337 cells/uL (ref 850–3900)
MCH: 28.8 pg (ref 27.0–33.0)
MCHC: 33.2 g/dL (ref 32.0–36.0)
MCV: 86.7 fL (ref 80.0–100.0)
MPV: 9.5 fL (ref 7.5–12.5)
Monocytes Absolute: 399 cells/uL (ref 200–950)
Monocytes Relative: 7 %
Neutro Abs: 2679 cells/uL (ref 1500–7800)
Neutrophils Relative %: 47 %
Platelets: 266 10*3/uL (ref 140–400)
RBC: 5.49 MIL/uL — ABNORMAL HIGH (ref 3.80–5.10)
RDW: 14.1 % (ref 11.0–15.0)
WBC: 5.7 10*3/uL (ref 4.0–10.5)

## 2015-08-05 LAB — LIPID PANEL
Cholesterol: 214 mg/dL — ABNORMAL HIGH (ref 125–200)
HDL: 39 mg/dL — ABNORMAL LOW (ref >=46)
LDL Cholesterol: 129 mg/dL (ref <130)
Total CHOL/HDL Ratio: 5.5 Ratio — ABNORMAL HIGH (ref <=5.0)
Triglycerides: 228 mg/dL — ABNORMAL HIGH (ref <150)
VLDL: 46 mg/dL — ABNORMAL HIGH (ref <30)

## 2015-08-05 LAB — HEPATITIS C ANTIBODY: HCV Ab: NEGATIVE

## 2015-08-05 MED ORDER — AMOXICILLIN-POT CLAVULANATE 875-125 MG PO TABS: 1.0000 | ORAL_TABLET | Freq: Two times a day (BID) | ORAL | Status: DC

## 2015-08-05 MED ORDER — FENOFIBRATE 160 MG PO TABS: 160.0000 mg | ORAL_TABLET | Freq: Every day | ORAL | Status: DC

## 2015-08-05 MED ORDER — ZOLPIDEM TARTRATE 5 MG PO TABS: 5.0000 mg | ORAL_TABLET | Freq: Every evening | ORAL | Status: DC | PRN

## 2015-08-05 MED ORDER — LOSARTAN POTASSIUM 25 MG PO TABS: 25.0000 mg | ORAL_TABLET | Freq: Every day | ORAL | Status: DC

## 2015-08-05 MED ORDER — CANAGLIFLOZIN 100 MG PO TABS: 100.0000 mg | ORAL_TABLET | Freq: Every day | ORAL | Status: DC

## 2015-08-05 MED ORDER — METFORMIN HCL 500 MG PO TABS: 500.0000 mg | ORAL_TABLET | Freq: Two times a day (BID) | ORAL | Status: DC

## 2015-08-05 NOTE — Progress Notes (Signed)
[name] is a 70 y.o. female who presents for annual visit and follow-up on chronic medical conditions.  She has the following concerns:She does have diabetes and continues on Invokana as well as metformin. Her exercise is quite minimal. She does smoke roughly 5 cigarettes per day and has no intentions to stop. She does occasionally check her blood sugar. She also checks her feet periodically. She did have an eye exam within the last year. She drinks very little. She does have a history of thyroid nodule however ultrasound and biopsy was done in 2006 which showed hyperplastic changes. 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 She was admitted and evaluated for chest pain however the evaluation showed no major cardiac issue. Scans did show evidence of very mild pericarditis which responded nicely to prednisone. Family and social history was recorded. Her mother will turn 102 in several weeks. She will be traveling to [place] to [place]. She would like Ambien renewed. She does have a history of statin intolerance. Presently she is taking fenofibrate. She has no other concerns or complaints.   Immunization History  Administered Date(s) Administered  . Influenza Whole 04/21/2010  . Influenza, High Dose Seasonal PF 02/20/2013  . Influenza-Unspecified 02/15/2014  . Pneumococcal Conjugate-13 02/20/2013  . Pneumococcal Polysaccharide-23 04/05/2006  . Tdap 09/14/2001  . Tetanus 03/27/2014  . Zoster 12/07/2013   Last Pap smear: n/a Last mammogram: 2 years ago Last colonoscopy: Dr. Loreta AveMann 07/14/13  Last DEXA: 2001  Dentist: Dr. Jonne Plyrosback Ophtho: Dr. Vedia CofferIstock- [facility] 11/14/14  Exercise: sometimes  Other doctors caring for patient include:   Depression screen:  See questionnaire below.  [facility] 2/9 03/27/2014 02/20/2013  Decreased Interest 0 0  Down, Depressed, Hopeless 0 0  PHQ - 2 Score 0 0    Fall Risk Screen: see questionnaire  below. Fall Risk  03/27/2014 02/20/2013  Falls in the past year? No No    ADL screen:  See questionnaire below Functional Status Survey:     End of Life Discussion:  Patient has a living will and medical power of attorney  Review of Systems Constitutional: -fever, -chills, -sweats, -unexpected weight change, -anorexia, -fatigue Allergy: -sneezing, -itching, -congestion Dermatology: denies changing moles, rash, lumps, new worrisome lesions ENT: -runny nose, -ear pain, -sore throat, -hoarseness, -sinus pain, -teeth pain, -tinnitus, -hearing loss, -epistaxis Cardiology:  -chest pain, -palpitations, -edema, -orthopnea, -paroxysmal nocturnal dyspnea Respiratory: -cough, -shortness of breath, -dyspnea on exertion, -wheezing, -hemoptysis Gastroenterology: -abdominal pain, -nausea, -vomiting, -diarrhea, -constipation, -blood in stool, -changes in bowel movement, -dysphagia Hematology: -bleeding or bruising problems Musculoskeletal: -arthralgias, -myalgias, -joint swelling, -back pain, -neck pain, -cramping, -gait changes Ophthalmology: -vision changes, -eye redness, -itching, -discharge Urology: -dysuria, -difficulty urinating, -hematuria, -urinary frequency, -urgency, incontinence Neurology: -headache, -weakness, -tingling, -numbness, -speech abnormality, -memory loss, -falls, -dizziness Psychology:  -depressed mood, -agitation, -sleep problems    PHYSICAL EXAM:   General Appearance: Alert, cooperative, no distress, appears stated age Head: Normocephalic, without obvious abnormality, atraumatic Eyes: PERRL, conjunctiva/corneas clear, EOM's intact, fundi benign Ears: Right TM does show evidence of perforation, canal and left TM and canals are normal. Nose: Nares normal, mucosa normal, no drainage or sinus   tenderness Throat: Lips, mucosa, and tongue normal; teeth and gums normal Neck: Supple, no lymphadenopathy; thyroid: no enlargement/tenderness/nodules; no carotid bruit or JVD Back:  Spine nontender, no curvature, ROM normal, no CVA tenderness Lungs: Clear to auscultation bilaterally without wheezes, rales or ronchi; respirations unlabored  Chest Wall: No tenderness or deformity Heart: Regular rate and rhythm, S1 and S2 normal, no murmur, rub or gallop Abdomen: Soft, non-tender, nondistended, normoactive bowel sounds, no masses, no hepatosplenomegaly Extremities: No clubbing, cyanosis or edema Pulses: 2+ and symmetric all extremities Skin: Skin color, texture, turgor normal, no rashes or lesions Lymph nodes: Cervical, supraclavicular, and axillary nodes normal Neurologic: CNII-XII intact, normal strength, sensation and gait; reflexes 2+ and symmetric throughout Psych: Normal mood, affect, hygiene and grooming. A1c is 6.5 ASSESSMENT/PLAN: Routine general medical examination at a health care facility - Plan: POCT urinalysis dipstick, CBC with Differential/Platelet, Comprehensive metabolic panel  Type 2 diabetes mellitus with complication, without long-term current use of insulin (HCC) - Plan: POCT glycosylated hemoglobin (Hb A1C), CBC with Differential/Platelet, Comprehensive metabolic panel, Lipid panel, POCT UA - Microalbumin, metFORMIN (GLUCOPHAGE) 500 MG tablet  Hyperlipidemia associated with type 2 diabetes mellitus (HCC) - Plan: Lipid panel, fenofibrate 160 MG tablet  Hypertension associated with diabetes (HCC) - Plan: CBC with Differential/Platelet, Comprehensive metabolic panel, losartan (COZAAR) 25 MG tablet  Encounter for long-term (current) use of medications - Plan: CBC with Differential/Platelet, Comprehensive metabolic panel, Lipid panel, zolpidem (AMBIEN) 5 MG tablet  Obesity (BMI 30-39.9)  Statin intolerance - Plan: Lipid panel  Occasional cigarette smoker  Need for hepatitis C screening test - Plan: Hepatitis C antibody   Will continue on her present medication regimen. Smoking cessation discussed however she is not interested.He will schedule a  mammogram. Her medications were renewed.  Discussed monthly self breast exams and yearly mammograms; at least 30 minutes of aerobic activity at least 5 days/week and weight-bearing exercise 2x/week; proper sunscreen use reviewed; healthy diet, including goals of calcium and vitamin D intake and alcohol recommendations (less than or equal to 1 drink/day) reviewed; regular seatbelt use; changing batteries in smoke detectors.  Immunization recommendations discussed.  Colonoscopy recommendations reviewed   Medicare Attestation I have personally reviewed: The patient's medical and social history Their use of alcohol, tobacco or illicit drugs Their current medications and supplements The patient's functional ability including ADLs,fall risks, home safety risks, cognitive, and hearing and visual impairment Diet and physical activities Evidence for depression or mood disorders  The patient's weight, height, and BMI have been recorded in the chart.  I have made referrals, counseling, and provided education to the patient based on review of the above and I have provided the patient with a written personalized care plan for preventive services.     Carollee Herter, MD   08/05/2015

## 2015-08-05 NOTE — Telephone Encounter (Signed)
i have tried at least 7 times so far today trying to call in patient's ambien and [facility] phone number is not working. i will continue to try to get in touch with them

## 2015-08-05 NOTE — Telephone Encounter (Signed)
Finally got through to pharmacy to call in pt's med

## 2015-08-05 NOTE — Patient Instructions (Signed)
  Ms. [name] , Thank you for taking time to come for your Medicare Wellness Visit. I appreciate your ongoing commitment to your health goals. Please review the following plan we discussed and let me know if I can assist you in the future.   These are the goals we discussed: Discussed smoking cessation however she is not interested in quitting. We'll follow-up concerning her cholesterol after the blood work is evaluated.  This is a list of the screening recommended for you and due dates:  Health Maintenance  Topic Date Due  . Complete foot exam   01/27/1958  . DEXA scan (bone density measurement)  01/27/2013  . Pneumonia vaccines (2 of 2 - PPSV23) 02/20/2014  . Eye exam for diabetics  11/23/2014  . Mammogram  03/03/2015  . Hemoglobin A1C  09/24/2015  . Flu Shot  12/03/2015  . Colon Cancer Screening  07/15/2023  . Tetanus Vaccine  03/27/2024  . Shingles Vaccine  Completed  .  Hepatitis C: One time screening is recommended by Center for Disease Control  (CDC) for  adults born from [birth year] through 1963.   Completed

## 2015-10-08 ENCOUNTER — Other Ambulatory Visit: Payer: Self-pay | Admitting: Family Medicine

## 2015-10-25 ENCOUNTER — Telehealth: Payer: Self-pay

## 2015-10-25 NOTE — Telephone Encounter (Signed)
Error

## 2015-11-27 ENCOUNTER — Telehealth: Payer: Self-pay

## 2015-11-27 ENCOUNTER — Other Ambulatory Visit: Payer: Self-pay | Admitting: Family Medicine

## 2015-11-27 DIAGNOSIS — Z1231 Encounter for screening mammogram for malignant neoplasm of breast: Secondary | ICD-10-CM

## 2015-11-27 NOTE — Telephone Encounter (Signed)
Pt called to ask if we can send order to [facility] for her DEXA scan. She has this scheduled for 12/02/2015. Trixie Rude

## 2015-11-27 NOTE — Telephone Encounter (Signed)
Take care of this 

## 2015-11-28 ENCOUNTER — Telehealth: Payer: Self-pay | Admitting: Family Medicine

## 2015-11-28 ENCOUNTER — Other Ambulatory Visit: Payer: Self-pay

## 2015-11-28 DIAGNOSIS — Z13820 Encounter for screening for osteoporosis: Secondary | ICD-10-CM

## 2015-11-28 NOTE — Telephone Encounter (Signed)
I have put order in epic 

## 2015-11-28 NOTE — Telephone Encounter (Signed)
Called and left message to see if pt needed to be seen by dr Susann Givens, she has a dexa scan scheduled for 12/02/2015,

## 2015-12-02 ENCOUNTER — Ambulatory Visit
Admission: RE | Admit: 2015-12-02 | Discharge: 2015-12-02 | Disposition: A | Discharge status: Home / Self Care | Payer: Medicare Other | Source: Ambulatory Visit | Attending: Family Medicine | Admitting: Family Medicine

## 2015-12-02 ENCOUNTER — Other Ambulatory Visit: Payer: Self-pay

## 2015-12-02 DIAGNOSIS — M810 Age-related osteoporosis without current pathological fracture: Secondary | ICD-10-CM

## 2015-12-02 DIAGNOSIS — Z1231 Encounter for screening mammogram for malignant neoplasm of breast: Secondary | ICD-10-CM

## 2015-12-02 LAB — HM MAMMOGRAPHY

## 2015-12-09 ENCOUNTER — Other Ambulatory Visit: Payer: Medicare Other

## 2015-12-11 ENCOUNTER — Ambulatory Visit
Admission: RE | Admit: 2015-12-11 | Discharge: 2015-12-11 | Disposition: A | Discharge status: Home / Self Care | Payer: Medicare Other | Source: Ambulatory Visit | Attending: Family Medicine | Admitting: Family Medicine

## 2015-12-11 DIAGNOSIS — M810 Age-related osteoporosis without current pathological fracture: Secondary | ICD-10-CM

## 2015-12-15 LAB — HM DEXA SCAN

## 2015-12-17 ENCOUNTER — Ambulatory Visit (INDEPENDENT_AMBULATORY_CARE_PROVIDER_SITE_OTHER): Payer: Medicare Other | Admitting: Sports Medicine

## 2015-12-17 ENCOUNTER — Encounter: Payer: Self-pay | Admitting: Sports Medicine

## 2015-12-17 DIAGNOSIS — B351 Tinea unguium: Secondary | ICD-10-CM

## 2015-12-17 DIAGNOSIS — B359 Dermatophytosis, unspecified: Secondary | ICD-10-CM | POA: Diagnosis not present

## 2015-12-17 DIAGNOSIS — E1140 Type 2 diabetes mellitus with diabetic neuropathy, unspecified: Secondary | ICD-10-CM

## 2015-12-17 DIAGNOSIS — M79671 Pain in right foot: Secondary | ICD-10-CM | POA: Diagnosis not present

## 2015-12-17 DIAGNOSIS — M79672 Pain in left foot: Secondary | ICD-10-CM

## 2015-12-17 NOTE — Patient Instructions (Addendum)
Lamisil AT Cream for Tinea  Diabetes and Foot Care Diabetes may cause you to have problems because of poor blood supply (circulation) to your feet and legs. This may cause the skin on your feet to become thinner, break easier, and heal more slowly. Your skin may become dry, and the skin may peel and crack. You may also have nerve damage in your legs and feet causing decreased feeling in them. You may not notice minor injuries to your feet that could lead to infections or more serious problems. Taking care of your feet is one of the most important things you can do for yourself.  HOME CARE INSTRUCTIONS  Wear shoes at all times, even in the house. Do not go barefoot. Bare feet are easily injured.  Check your feet daily for blisters, cuts, and redness. If you cannot see the bottom of your feet, use a mirror or ask someone for help.  Wash your feet with warm water (do not use hot water) and mild soap. Then pat your feet and the areas between your toes until they are completely dry. Do not soak your feet as this can dry your skin.  Apply a moisturizing lotion or petroleum jelly (that does not contain alcohol and is unscented) to the skin on your feet and to dry, brittle toenails. Do not apply lotion between your toes.  Trim your toenails straight across. Do not dig under them or around the cuticle. File the edges of your nails with an emery board or nail file.  Do not cut corns or calluses or try to remove them with medicine.  Wear clean socks or stockings every day. Make sure they are not too tight. Do not wear knee-high stockings since they may decrease blood flow to your legs.  Wear shoes that fit properly and have enough cushioning. To break in new shoes, wear them for just a few hours a day. This prevents you from injuring your feet. Always look in your shoes before you put them on to be sure there are no objects inside.  Do not cross your legs. This may decrease the blood flow to your  feet.  If you find a minor scrape, cut, or break in the skin on your feet, keep it and the skin around it clean and dry. These areas may be cleansed with mild soap and water. Do not cleanse the area with peroxide, alcohol, or iodine.  When you remove an adhesive bandage, be sure not to damage the skin around it.  If you have a wound, look at it several times a day to make sure it is healing.  Do not use heating pads or hot water bottles. They may burn your skin. If you have lost feeling in your feet or legs, you may not know it is happening until it is too late.  Make sure your health care provider performs a complete foot exam at least annually or more often if you have foot problems. Report any cuts, sores, or bruises to your health care provider immediately. SEEK MEDICAL CARE IF:   You have an injury that is not healing.  You have cuts or breaks in the skin.  You have an ingrown nail.  You notice redness on your legs or feet.  You feel burning or tingling in your legs or feet.  You have pain or cramps in your legs and feet.  Your legs or feet are numb.  Your feet always feel cold. SEEK IMMEDIATE MEDICAL CARE IF:  There is increasing redness, swelling, or pain in or around a wound.  There is a red line that goes up your leg.  Pus is coming from a wound.  You develop a fever or as directed by your health care provider.  You notice a bad smell coming from an ulcer or wound.   This information is not intended to replace advice given to you by your health care provider. Make sure you discuss any questions you have with your health care provider.   Document Released: 04/17/2000 Document Revised: 12/21/2012 Document Reviewed: 09/27/2012 Elsevier Interactive Patient Education [employer].

## 2015-12-17 NOTE — Progress Notes (Signed)
[name] is a 70 y.o. Diabetic female patient seen today in office with complaint of painful thickened and discolored nails. Patient is desiring treatment for nail changes; has tried Pedicures in the past with no improvement. Reports that nails are becoming difficult to manage because of the thickness which has been present for years. Also admits to a rash that flares up during summer time on both feet. Patient has no other pedal complaints at this time.   Patient Active Problem List   Diagnosis Date Noted  . 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 Statin intolerance 08/05/2015  . Obesity (BMI 30-39.9) 08/03/2014  . Diabetes mellitus type 2 with complications (HCC) 12/12/2013  . Hypertension associated with diabetes (HCC) 02/16/2012  . ASTHMA, UNSPECIFIED, UNSPECIFIED STATUS 04/09/2010  . Occasional cigarette smoker 04/07/2010  . Hyperlipidemia associated with type 2 diabetes mellitus (HCC) 03/18/2010  . Sinusitis, chronic 03/18/2010    Current Outpatient Prescriptions on File Prior to Visit  Medication Sig Dispense Refill  . albuterol (PROVENTIL HFA;VENTOLIN HFA) 108 (90 BASE) MCG/ACT inhaler Inhale 2 puffs into the lungs every 6 (six) hours as needed for wheezing or shortness of breath. 1 Inhaler 0  . amoxicillin-clavulanate (AUGMENTIN) 875-125 MG tablet Take 1 tablet by mouth 2 (two) times daily. 20 tablet 0  . aspirin 81 MG EC tablet Take 81 mg by mouth daily.      . fenofibrate 160 MG tablet Take 1 tablet (160 mg total) by mouth daily. 90 tablet 3  . INVOKANA 100 MG TABS tablet TAKE ONE TABLET BY MOUTH ONCE DAILY 90 tablet 0  . losartan (COZAAR) 25 MG tablet Take 1 tablet (25 mg total) by mouth daily. 90 tablet 3  . metFORMIN (GLUCOPHAGE) 500 MG tablet Take 1 tablet (500 mg total) by mouth 2 (two) times daily with a meal. 180 tablet 1  . Multiple Vitamins-Minerals (MULTIVITAMIN WITH MINERALS) tablet Take 1 tablet by mouth daily.    . Omega-3 Fatty Acids (FISH  OIL) 1200 MG CAPS Take 2 capsules by mouth daily.      Marland Kitchen. zolpidem (AMBIEN) 5 MG tablet Take 1 tablet (5 mg total) by mouth at bedtime as needed for sleep. 30 tablet 1   No current facility-administered medications on file prior to visit.     Allergies  Allergen Reactions  . Ibuprofen Shortness Of Breath and Other (See Comments)    , REACTION: chest tightness  . Lipitor [Atorvastatin Calcium]     Objective: Physical Exam  General: Well developed, nourished, no acute distress, awake, alert and oriented x 3  Vascular: Dorsalis pedis artery 2/4 bilateral, Posterior tibial artery 2/4 bilateral, skin temperature warm to warm proximal to distal bilateral lower extremities, no varicosities, scant pedal hair present bilateral.  Neurological: Gross sensation present via light touch bilateral. Protective and vibratory within normal limits.   Dermatological: Skin is warm, dry, and supple bilateral, Nails 1-10 are tender, mildly elongated and thick, and discolored with mild subungal debris most involved nails left 4th and right 1,3,5th toenails, no webspace macerations present bilateral, no open lesions present bilateral, no callus/corns/hyperkeratotic tissue present bilateral. + scaly skin in annular fashion resembling tinea bilateral. No other signs of infection bilateral.  Musculoskeletal: +Bunion boney deformities noted bilateral. Muscular strength within normal limits without painon range of motion. No pain with calf compression bilateral.  Assessment and Plan:  Problem List Items Addressed This Visit    None    Visit Diagnoses    Dermatophytosis of nail    -  Primary   possible   Relevant Orders   Fungus Culture with Smear   Foot pain, bilateral       Type 2 diabetes mellitus with diabetic neuropathy, unspecified long term insulin use status (HCC)       Tinea          -Examined patient. -Discussed and educated patient on diabetic foot care, especially with  regards to the  vascular, neurological and musculoskeletal systems.  -Stressed the importance of good glycemic control and the detriment of not  controlling glucose levels in relation to the foot. -Mechanically debrided all nails 1-5 bilateral using sterile nail nipper and filed with dremel without incident and sent for fungal culture [place] -Recommend lamisil AT cream OTC for tinea -Answered all patient questions -Patient to return  in 61month for fungal culture results -Patient advised to call the office if any problems or questions arise in the meantime.   Asencion Islamitorya Starling Jessie, DPM

## 2015-12-18 LAB — HM DIABETES EYE EXAM

## 2016-01-29 ENCOUNTER — Other Ambulatory Visit: Payer: Self-pay | Admitting: Family Medicine

## 2016-02-11 ENCOUNTER — Ambulatory Visit (INDEPENDENT_AMBULATORY_CARE_PROVIDER_SITE_OTHER): Payer: Medicare Other | Admitting: Sports Medicine

## 2016-02-11 ENCOUNTER — Encounter: Payer: Self-pay | Admitting: Sports Medicine

## 2016-02-11 DIAGNOSIS — M79671 Pain in right foot: Secondary | ICD-10-CM

## 2016-02-11 DIAGNOSIS — B351 Tinea unguium: Secondary | ICD-10-CM | POA: Diagnosis not present

## 2016-02-11 DIAGNOSIS — E1140 Type 2 diabetes mellitus with diabetic neuropathy, unspecified: Secondary | ICD-10-CM

## 2016-02-11 DIAGNOSIS — M79672 Pain in left foot: Secondary | ICD-10-CM | POA: Diagnosis not present

## 2016-02-11 DIAGNOSIS — B359 Dermatophytosis, unspecified: Secondary | ICD-10-CM | POA: Diagnosis not present

## 2016-02-11 NOTE — Progress Notes (Signed)
Subjective: Anselm PancoastMarita T Baksh is a 70 y.o. female patient seen today in office for fungal culture results. Patient has no other pedal complaints at this time.   Patient Active Problem List   Diagnosis Date Noted  . 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 Statin intolerance 08/05/2015  . Obesity (BMI 30-39.9) 08/03/2014  . Diabetes mellitus type 2 with complications (HCC) 12/12/2013  . Hypertension associated with diabetes (HCC) 02/16/2012  . ASTHMA, UNSPECIFIED, UNSPECIFIED STATUS 04/09/2010  . Occasional cigarette smoker 04/07/2010  . Hyperlipidemia associated with type 2 diabetes mellitus (HCC) 03/18/2010  . Sinusitis, chronic 03/18/2010    Current Outpatient Prescriptions on File Prior to Visit  Medication Sig Dispense Refill  . albuterol (PROVENTIL HFA;VENTOLIN HFA) 108 (90 BASE) MCG/ACT inhaler Inhale 2 puffs into the lungs every 6 (six) hours as needed for wheezing or shortness of breath. 1 Inhaler 0  . amoxicillin-clavulanate (AUGMENTIN) 875-125 MG tablet Take 1 tablet by mouth 2 (two) times daily. 20 tablet 0  . aspirin 81 MG EC tablet Take 81 mg by mouth daily.      . fenofibrate 160 MG tablet Take 1 tablet (160 mg total) by mouth daily. 90 tablet 3  . INVOKANA 100 MG TABS tablet TAKE ONE TABLET BY MOUTH ONCE DAILY 90 tablet 0  . losartan (COZAAR) 25 MG tablet Take 1 tablet (25 mg total) by mouth daily. 90 tablet 3  . metFORMIN (GLUCOPHAGE) 500 MG tablet Take 1 tablet (500 mg total) by mouth 2 (two) times daily with a meal. 180 tablet 1  . Multiple Vitamins-Minerals (MULTIVITAMIN WITH MINERALS) tablet Take 1 tablet by mouth daily.    . Omega-3 Fatty Acids (FISH OIL) 1200 MG CAPS Take 2 capsules by mouth daily.      Marland Kitchen. zolpidem (AMBIEN) 5 MG tablet Take 1 tablet (5 mg total) by mouth at bedtime as needed for sleep. 30 tablet 1   No current facility-administered medications on file prior to visit.     Allergies  Allergen Reactions  . Ibuprofen Shortness Of Breath and Other  (See Comments)    , REACTION: chest tightness  . Lipitor [Atorvastatin Calcium]     Objective: Physical Exam  General: Well developed, nourished, no acute distress, awake, alert and oriented x 3  Vascular: Dorsalis pedis artery 2/4 bilateral, Posterior tibial artery 2/4 bilateral, skin temperature warm to warm proximal to distal bilateral lower extremities, no varicosities, scant pedal hair present bilateral.  Neurological: Gross sensation present via light touch bilateral. Protective and vibratory normal.   Dermatological: Skin is warm, dry, and supple bilateral, Nails 1-10 are tender, short thick, and discolored with mild subungal debris with most involved nails left 4th and right 1st, 3rd, and 5th, + scaly skin resembling tinea, no webspace macerations present bilateral, no open lesions present bilateral, no callus/corns/hyperkeratotic tissue present bilateral. No other signs of infection bilateral.  Musculoskeletal: + Bunion boney deformities noted bilateral. Muscular strength within normal limits without painon range of motion. No pain with calf compression bilateral.  Fungal cx + T Rubrum  Assessment and Plan:  Problem List Items Addressed This Visit    None    Visit Diagnoses    Dermatophytosis of nail    -  Primary   Relevant Orders   Hepatic Function Panel   Foot pain, bilateral       Tinea       Type 2 diabetes mellitus with diabetic neuropathy, unspecified long term insulin use status (HCC)          -  Examined patient -Discussed treatment options for painful mycotic nails -Patient would like to discuss Lamisil with PCP; Blood-work orders given for LFTs.  -Patient opt for topical; Encouraged patient to purchase Formula 3 -Advised good hygiene habits -Patient to return after she has decided on treatment and discussed lamisil with PCP or sooner if symptoms worsen.  Asencion Islam, DPM

## 2016-02-14 ENCOUNTER — Encounter: Payer: Self-pay | Admitting: Family Medicine

## 2016-02-14 ENCOUNTER — Ambulatory Visit (INDEPENDENT_AMBULATORY_CARE_PROVIDER_SITE_OTHER): Payer: Medicare Other | Admitting: Family Medicine

## 2016-02-14 VITALS — BP 100/60 | HR 76 | Ht 64.0 in | Wt 205.0 lb

## 2016-02-14 DIAGNOSIS — E785 Hyperlipidemia, unspecified: Secondary | ICD-10-CM | POA: Diagnosis not present

## 2016-02-14 DIAGNOSIS — I152 Hypertension secondary to endocrine disorders: Secondary | ICD-10-CM

## 2016-02-14 DIAGNOSIS — M8580 Other specified disorders of bone density and structure, unspecified site: Secondary | ICD-10-CM

## 2016-02-14 DIAGNOSIS — E1169 Type 2 diabetes mellitus with other specified complication: Secondary | ICD-10-CM

## 2016-02-14 DIAGNOSIS — E118 Type 2 diabetes mellitus with unspecified complications: Secondary | ICD-10-CM

## 2016-02-14 DIAGNOSIS — Z789 Other specified health status: Secondary | ICD-10-CM | POA: Diagnosis not present

## 2016-02-14 DIAGNOSIS — E1159 Type 2 diabetes mellitus with other circulatory complications: Secondary | ICD-10-CM | POA: Diagnosis not present

## 2016-02-14 DIAGNOSIS — E1136 Type 2 diabetes mellitus with diabetic cataract: Secondary | ICD-10-CM | POA: Diagnosis not present

## 2016-02-14 DIAGNOSIS — Z720 Tobacco use: Secondary | ICD-10-CM | POA: Diagnosis not present

## 2016-02-14 DIAGNOSIS — I10 Essential (primary) hypertension: Secondary | ICD-10-CM | POA: Diagnosis not present

## 2016-02-14 DIAGNOSIS — B351 Tinea unguium: Secondary | ICD-10-CM

## 2016-02-14 LAB — POCT GLYCOSYLATED HEMOGLOBIN (HGB A1C): Hemoglobin A1C: 6.3

## 2016-02-14 NOTE — Progress Notes (Signed)
Subjective:    Patient ID: [name], female    DOB: January 31, 1946, 70 y.o.   MRN: [id number]  [name] is a 70 y.o. female who presents for follow-up of Type 2 diabetes mellitus.  Patient is checking home blood sugars.   Home blood sugar records 80 to 140 How often is blood sugars being checked: every other day Current symptoms/problems none. Daily foot checks: yes  Any foot concerns: nails. She has seen a podiatrist and apparently cultures were taken. Recommendations were given concerning care of the nails. Last eye exam: 12/18/2015 Exercise:Yard work She has seen her eye doctor and apparently does have early cataracts. Also recent DEXA scan did show osteopenia. She has had a mammogram. She does have a history of statin intolerance. She continues on her Invokana, phenyl fibrate, losartan as well as metformin. She continues to smoke and is not interested in quitting. The following portions of the patient's history were reviewed and updated as appropriate: allergies, current medications, past medical history, past social history and problem list.  ROS as in subjective above.     Objective:    Physical Exam Alert and in no distress . Toenails difficult to assess due to nail polish but they did appear thickened..    Lab Review Diabetic Labs Latest Ref Rng & Units 08/05/2015 03/27/2015 03/27/2015 03/27/2015 12/06/2014  HbA1c - 6.5% 7.2(H) - - 6.0  Microalbumin mg/L 7.4 - - - -  Micro/Creat Ratio - 9.3 mg/g - - - -  Chol 125 - 200 mg/dL 130(Q214(H) - - - -  HDL >=65>=46 mg/dL 78(I39(L) - - - -  Calc LDL <130 mg/dL 696129 - - - -  Triglycerides <150 mg/dL 295(M228(H) - - - -  Creatinine 0.50 - 0.99 mg/dL 8.410.72 - 3.240.85 4.010.70 -   BP/Weight 02/14/2016 08/05/2015 04/02/2015 03/28/2015 03/27/2015  Systolic BP 100 124 100 121 -  Diastolic BP 60 60 60 67 -  Wt. (Lbs) 205 204 208 208 -  BMI 35.19 35 35.69 - 35.69   Foot/eye exam completion dates Latest Ref Rng & Units 12/18/2015 11/22/2013  Eye Exam No  Retinopathy No Retinopathy No Retinopathy  Foot Form Completion - - -  Hemoglobin A1c is 6.3  [name]  reports that she has been smoking.  She has never used smokeless tobacco. She reports that she does not drink alcohol or use drugs.     Assessment & Plan:    Hypertension associated with diabetes (HCC)  Hyperlipidemia associated with type 2 diabetes mellitus (HCC)  Type 2 diabetes mellitus with complication, without long-term current use of insulin (HCC)  Occasional cigarette smoker  Statin intolerance  Osteopenia, unspecified location  Cataract associated with type 2 diabetes mellitus (HCC)  Onychomycosis   1. Rx changes: none 2. Education: Reviewed 'ABCs' of diabetes management (respective goals in parentheses):  A1C (<7), blood pressure (<130/80), and cholesterol (LDL <100). 3. Compliance at present is estimated to be good. Efforts to improve compliance (if necessary) will be directed at increased exercise. 4. Given she is not interested in quitting smoking at this time. 5. Discussed the osteopenia with her in regard to increasing her physical activity as well as vitamin D and calcium 6. Discussed in detail the treatment of onychomycosis explaining the possibility of cure versus controlling the amount time taken to take care of this. She has opted to hold off on any treatment of this. Continue to be followed by her ophthalmologist. Overall things are going well. Follow up: 4 months

## 2016-02-14 NOTE — Patient Instructions (Signed)
Extra calcium. Take at least 1000 international units of vitamin D per day

## 2016-03-03 ENCOUNTER — Telehealth: Payer: Self-pay | Admitting: Family Medicine

## 2016-03-03 NOTE — Telephone Encounter (Signed)
She is statin intolerant. 

## 2016-03-03 NOTE — Telephone Encounter (Signed)
[facility] to see if Dr Susann GivensLalonde wants to consider putting pt on statin therapy since pt is diabetic. Please call [facility] at [phone]

## 2016-03-05 NOTE — Telephone Encounter (Signed)
Called and talked with [employer] to inform them of her intolerance

## 2016-03-05 NOTE — Telephone Encounter (Signed)
No one there knew anything about this and x did not exsist

## 2016-03-29 ENCOUNTER — Encounter: Payer: Self-pay | Admitting: Family Medicine

## 2016-03-30 MED ORDER — AMOXICILLIN-POT CLAVULANATE 875-125 MG PO TABS: 1.0000 | ORAL_TABLET | Freq: Two times a day (BID) | ORAL | 0 refills | Status: DC

## 2016-03-30 NOTE — Telephone Encounter (Signed)
She has a trip planned back to [place] and would like an antibiotic to take with her in case she runs into trouble. I will give her Augmentin.

## 2016-05-09 ENCOUNTER — Other Ambulatory Visit: Payer: Self-pay | Admitting: Family Medicine

## 2016-05-09 DIAGNOSIS — E118 Type 2 diabetes mellitus with unspecified complications: Secondary | ICD-10-CM

## 2016-06-17 ENCOUNTER — Ambulatory Visit: Payer: Medicare Other | Admitting: Family Medicine

## 2016-06-18 ENCOUNTER — Ambulatory Visit (INDEPENDENT_AMBULATORY_CARE_PROVIDER_SITE_OTHER): Payer: PPO | Admitting: Family Medicine

## 2016-06-18 ENCOUNTER — Encounter: Payer: Self-pay | Admitting: Family Medicine

## 2016-06-18 VITALS — BP 116/72 | HR 96 | Ht 64.0 in | Wt 205.0 lb

## 2016-06-18 DIAGNOSIS — E1159 Type 2 diabetes mellitus with other circulatory complications: Secondary | ICD-10-CM

## 2016-06-18 DIAGNOSIS — E785 Hyperlipidemia, unspecified: Secondary | ICD-10-CM

## 2016-06-18 DIAGNOSIS — I10 Essential (primary) hypertension: Secondary | ICD-10-CM

## 2016-06-18 DIAGNOSIS — Z720 Tobacco use: Secondary | ICD-10-CM

## 2016-06-18 DIAGNOSIS — E118 Type 2 diabetes mellitus with unspecified complications: Secondary | ICD-10-CM | POA: Diagnosis not present

## 2016-06-18 DIAGNOSIS — Z789 Other specified health status: Secondary | ICD-10-CM

## 2016-06-18 DIAGNOSIS — E1169 Type 2 diabetes mellitus with other specified complication: Secondary | ICD-10-CM

## 2016-06-18 DIAGNOSIS — E669 Obesity, unspecified: Secondary | ICD-10-CM

## 2016-06-18 MED ORDER — CANAGLIFLOZIN 100 MG PO TABS: 100.0000 mg | ORAL_TABLET | Freq: Every day | ORAL | 1 refills | Status: DC

## 2016-06-18 MED ORDER — FENOFIBRATE 160 MG PO TABS: 160.0000 mg | ORAL_TABLET | Freq: Every day | ORAL | 3 refills | Status: DC

## 2016-06-18 NOTE — Progress Notes (Signed)
  Subjective:    Patient ID: [name], female    DOB: [birth year], 71 y.o.   MRN: [id number]  Jennifer PancoastMarita T Hoffman is a 71 y.o. female who presents for follow-up of Type 2 diabetes mellitus.  Patient is checking home blood sugars.   Home blood sugar records: 80 to 135 How often is blood sugars being checked: every few days Current symptoms/problems none Daily foot checks: yes   Any foot concerns: none Last eye exam: 12/2015 Exercise: [employer] 2 times a week. She does have a history of statin intolerance. She continues to smoke and is not interested in quitting. She continues on her metformin, losartan as well as Invokana and fenofibrate. She does occasionally use an Ambien.  The following portions of the patient's history were reviewed and updated as appropriate: allergies, current medications, past medical history, past social history and problem list.  ROS as in subjective above.     Objective:    Physical Exam Alert and in no distress otherwise not examined.  Lab Review Diabetic Labs Latest Ref Rng & Units 02/14/2016 08/05/2015 03/27/2015 03/27/2015 03/27/2015  HbA1c - 6.3 6.5% 7.2(H) - -  Microalbumin mg/L - 7.4 - - -  Micro/Creat Ratio - - 9.3 mg/g - - -  Chol 125 - 200 mg/dL - 409(W214(H) - - -  HDL >=11>=46 mg/dL - 91(Y39(L) - - -  Calc LDL <130 mg/dL - 782129 - - -  Triglycerides <150 mg/dL - 956(O228(H) - - -  Creatinine 0.50 - 0.99 mg/dL - 1.300.72 - 8.650.85 7.840.70   BP/Weight 02/14/2016 08/05/2015 04/02/2015 03/28/2015 03/27/2015  Systolic BP 100 124 100 121 -  Diastolic BP 60 60 60 67 -  Wt. (Lbs) 205 204 208 208 -  BMI 35.19 35 35.69 - 35.69   Foot/eye exam completion dates Latest Ref Rng & Units 12/18/2015 11/22/2013  Eye Exam No Retinopathy No Retinopathy No Retinopathy  Foot Form Completion - - -   IMA globin A1c is 6.4 [name]  reports that she has been smoking.  She has never used smokeless tobacco. She reports that she does not drink alcohol or use drugs.     Assessment & Plan:     1. Rx  changes: Sample of Livalo 2 mg given. She will let me know whether she tolerates that. 2. Education: Reviewed 'ABCs' of diabetes management (respective goals in parentheses):  A1C (<7), blood pressure (<130/80), and cholesterol (LDL <100). 3. Compliance at present is estimated to be good. Efforts to improve compliance (if necessary) will be directed at increased exercise. 4. Follow up: 4 months

## 2016-06-19 LAB — POCT GLYCOSYLATED HEMOGLOBIN (HGB A1C): Hemoglobin A1C: 6.4

## 2016-10-20 ENCOUNTER — Other Ambulatory Visit: Payer: Self-pay | Admitting: Family Medicine

## 2016-10-20 ENCOUNTER — Encounter: Payer: Self-pay | Admitting: Family Medicine

## 2016-10-20 ENCOUNTER — Other Ambulatory Visit: Payer: Self-pay

## 2016-10-20 ENCOUNTER — Ambulatory Visit (INDEPENDENT_AMBULATORY_CARE_PROVIDER_SITE_OTHER): Payer: PPO | Admitting: Family Medicine

## 2016-10-20 ENCOUNTER — Telehealth: Payer: Self-pay

## 2016-10-20 VITALS — BP 114/68 | HR 81 | Wt 199.6 lb

## 2016-10-20 DIAGNOSIS — E785 Hyperlipidemia, unspecified: Secondary | ICD-10-CM | POA: Diagnosis not present

## 2016-10-20 DIAGNOSIS — E1159 Type 2 diabetes mellitus with other circulatory complications: Secondary | ICD-10-CM

## 2016-10-20 DIAGNOSIS — E118 Type 2 diabetes mellitus with unspecified complications: Secondary | ICD-10-CM

## 2016-10-20 DIAGNOSIS — E1169 Type 2 diabetes mellitus with other specified complication: Secondary | ICD-10-CM

## 2016-10-20 DIAGNOSIS — I10 Essential (primary) hypertension: Secondary | ICD-10-CM | POA: Diagnosis not present

## 2016-10-20 DIAGNOSIS — E669 Obesity, unspecified: Secondary | ICD-10-CM

## 2016-10-20 DIAGNOSIS — Z79899 Other long term (current) drug therapy: Secondary | ICD-10-CM

## 2016-10-20 DIAGNOSIS — I152 Hypertension secondary to endocrine disorders: Secondary | ICD-10-CM

## 2016-10-20 DIAGNOSIS — Z789 Other specified health status: Secondary | ICD-10-CM | POA: Diagnosis not present

## 2016-10-20 DIAGNOSIS — Z720 Tobacco use: Secondary | ICD-10-CM

## 2016-10-20 LAB — CBC WITH DIFFERENTIAL/PLATELET
Basophils Absolute: 69 cells/uL (ref 0–200)
Basophils Relative: 1 %
Eosinophils Absolute: 207 cells/uL (ref 15–500)
Eosinophils Relative: 3 %
HCT: 45.8 % — ABNORMAL HIGH (ref 35.0–45.0)
Hemoglobin: 15.5 g/dL (ref 11.7–15.5)
Lymphocytes Relative: 39 %
Lymphs Abs: 2691 cells/uL (ref 850–3900)
MCH: 29.6 pg (ref 27.0–33.0)
MCHC: 33.8 g/dL (ref 32.0–36.0)
MCV: 87.6 fL (ref 80.0–100.0)
MPV: 9.6 fL (ref 7.5–12.5)
Monocytes Absolute: 345 cells/uL (ref 200–950)
Monocytes Relative: 5 %
Neutro Abs: 3588 cells/uL (ref 1500–7800)
Neutrophils Relative %: 52 %
Platelets: 265 10*3/uL (ref 140–400)
RBC: 5.23 MIL/uL — ABNORMAL HIGH (ref 3.80–5.10)
RDW: 14.1 % (ref 11.0–15.0)
WBC: 6.9 10*3/uL (ref 4.0–10.5)

## 2016-10-20 LAB — COMPREHENSIVE METABOLIC PANEL
ALT: 22 U/L (ref 6–29)
AST: 33 U/L (ref 10–35)
Albumin: 4.3 g/dL (ref 3.6–5.1)
Alkaline Phosphatase: 35 U/L (ref 33–130)
BUN: 21 mg/dL (ref 7–25)
CO2: 25 mmol/L (ref 20–31)
Calcium: 9.6 mg/dL (ref 8.6–10.4)
Chloride: 101 mmol/L (ref 98–110)
Creat: 0.86 mg/dL (ref 0.50–0.99)
Glucose, Bld: 79 mg/dL (ref 65–99)
Potassium: 4.4 mmol/L (ref 3.5–5.3)
Sodium: 135 mmol/L (ref 135–146)
Total Bilirubin: 0.5 mg/dL (ref 0.2–1.2)
Total Protein: 7.8 g/dL (ref 6.1–8.1)

## 2016-10-20 LAB — POCT GLYCOSYLATED HEMOGLOBIN (HGB A1C): Hemoglobin A1C: 6.2

## 2016-10-20 LAB — LIPID PANEL
Cholesterol: 211 mg/dL — ABNORMAL HIGH (ref <200)
HDL: 38 mg/dL — ABNORMAL LOW (ref >50)
LDL Cholesterol: 122 mg/dL — ABNORMAL HIGH (ref <100)
Total CHOL/HDL Ratio: 5.6 Ratio — ABNORMAL HIGH (ref <5.0)
Triglycerides: 256 mg/dL — ABNORMAL HIGH (ref <150)
VLDL: 51 mg/dL — ABNORMAL HIGH (ref <30)

## 2016-10-20 NOTE — Progress Notes (Signed)
  Subjective:    Patient ID: [name], female    DOB: [birth year], 71 y.o.   MRN: [id number]  Jennifer PancoastMarita T Hoffman is a 71 y.o. female who presents for follow-up of Type 2 diabetes mellitus.  Patient is checking home blood sugars.   Home blood sugar records: ranging from 90 to 130  How often is blood sugars being checked: once a day Current symptoms/problems include none  Daily foot checks: yes   Any foot concerns: none Last eye exam: august 2018 Exercise: couple times a week She continues on metformin and is having no difficulty with this. She is also taking Cozaar for her blood pressure without difficulty. She continues on fenofibrate as she is statin intolerant. She is also taking Invokana and having no urinary symptoms from this. She continues to smoke and has no interest in quitting. She continues on over-the-counter medications. She also uses Ambien very rarely. The following portions of the patient's history were reviewed and updated as appropriate: allergies, current medications, past medical history, past social history and problem list.  ROS as in subjective above.     Objective:    Physical Exam Alert and in no distress Foot exam was normal. Weight 199 lb 9.6 oz (90.5 kg).  Lab Review Diabetic Labs Latest Ref Rng & Units 06/18/2016 02/14/2016 08/05/2015 03/27/2015 03/27/2015  HbA1c - 6.4 6.3 6.5% 7.2(H) -  Microalbumin mg/L - - 7.4 - -  Micro/Creat Ratio - - - 9.3 mg/g - -  Chol 125 - 200 mg/dL - - 409(W214(H) - -  HDL >=11>=46 mg/dL - - 91(Y39(L) - -  Calc LDL <130 mg/dL - - 782129 - -  Triglycerides <150 mg/dL - - 956(O228(H) - -  Creatinine 0.50 - 0.99 mg/dL - - 1.300.72 - 8.650.85   BP/Weight 10/20/2016 06/18/2016 02/14/2016 08/05/2015 04/02/2015  Systolic BP - 116 100 124 100  Diastolic BP - 72 60 60 60  Wt. (Lbs) 199.6 205 205 204 208  BMI 34.26 35.19 35.19 35 35.69   Foot/eye exam completion dates Latest Ref Rng & Units 12/18/2015 11/22/2013  Eye Exam No Retinopathy No Retinopathy No  Retinopathy  Foot Form Completion - - -  A1c is 6.2  [name]  reports that she has been smoking.  She has never used smokeless tobacco. She reports that she does not drink alcohol or use drugs.     Assessment & Plan:    Type 2 diabetes mellitus with complication, without long-term current use of insulin (HCC) - Plan: HgB A1c, Microalbumin/Creatinine Ratio, Urine, CBC with Differential/Platelet, Comprehensive metabolic panel, Lipid panel, CANCELED: POCT UA - Microalbumin  Hypertension associated with diabetes (HCC) - Plan: CBC with Differential/Platelet, Comprehensive metabolic panel  Hyperlipidemia associated with type 2 diabetes mellitus (HCC) - Plan: Lipid panel  Occasional cigarette smoker  Statin intolerance  Obesity (BMI 30-39.9) - Plan: CBC with Differential/Platelet, Comprehensive metabolic panel, Lipid panel Overall she is doing fairly well on her present medication regimen for the above diagnoses. Again suggested quitting smoking but she is not interested in this. Also encouraged her to become more physically active.  1. Rx changes: none 2. Education: Reviewed 'ABCs' of diabetes management (respective goals in parentheses):  A1C (<7), blood pressure (<130/80), and cholesterol (LDL <100). 3. Compliance at present is estimated to be good. Efforts to improve compliance (if necessary) will be directed at increased exercise. 4. Follow up: 4 months

## 2016-10-20 NOTE — Telephone Encounter (Signed)
Pt requesting refill of ambien to [facility]. Trixie Rude/RLB

## 2016-10-20 NOTE — Telephone Encounter (Signed)
ok 

## 2016-10-20 NOTE — Telephone Encounter (Signed)
Is this okay to refill? 

## 2016-10-20 NOTE — Telephone Encounter (Signed)
I called it in already

## 2016-10-20 NOTE — Telephone Encounter (Signed)
[place] have [place]

## 2016-10-21 LAB — MICROALBUMIN / CREATININE URINE RATIO
Creatinine, Urine: 84 mg/dL (ref 20–320)
Microalb, Ur: <0.2 mg/dL

## 2016-12-31 DIAGNOSIS — H2513 Age-related nuclear cataract, bilateral: Secondary | ICD-10-CM | POA: Diagnosis not present

## 2016-12-31 DIAGNOSIS — H47013 Ischemic optic neuropathy, bilateral: Secondary | ICD-10-CM | POA: Diagnosis not present

## 2016-12-31 DIAGNOSIS — H524 Presbyopia: Secondary | ICD-10-CM | POA: Diagnosis not present

## 2016-12-31 DIAGNOSIS — E119 Type 2 diabetes mellitus without complications: Secondary | ICD-10-CM | POA: Diagnosis not present

## 2016-12-31 DIAGNOSIS — H5203 Hypermetropia, bilateral: Secondary | ICD-10-CM | POA: Diagnosis not present

## 2017-01-22 ENCOUNTER — Other Ambulatory Visit: Payer: Self-pay | Admitting: Family Medicine

## 2017-01-22 DIAGNOSIS — E118 Type 2 diabetes mellitus with unspecified complications: Secondary | ICD-10-CM

## 2017-02-23 ENCOUNTER — Ambulatory Visit (INDEPENDENT_AMBULATORY_CARE_PROVIDER_SITE_OTHER): Payer: PPO | Admitting: Family Medicine

## 2017-02-23 ENCOUNTER — Encounter: Payer: Self-pay | Admitting: Family Medicine

## 2017-02-23 VITALS — BP 120/68 | HR 77 | Ht 64.17 in | Wt 200.6 lb

## 2017-02-23 DIAGNOSIS — Z23 Encounter for immunization: Secondary | ICD-10-CM

## 2017-02-23 DIAGNOSIS — H47013 Ischemic optic neuropathy, bilateral: Secondary | ICD-10-CM

## 2017-02-23 DIAGNOSIS — E1136 Type 2 diabetes mellitus with diabetic cataract: Secondary | ICD-10-CM

## 2017-02-23 DIAGNOSIS — E785 Hyperlipidemia, unspecified: Secondary | ICD-10-CM

## 2017-02-23 DIAGNOSIS — E1169 Type 2 diabetes mellitus with other specified complication: Secondary | ICD-10-CM

## 2017-02-23 DIAGNOSIS — Z720 Tobacco use: Secondary | ICD-10-CM

## 2017-02-23 DIAGNOSIS — I10 Essential (primary) hypertension: Secondary | ICD-10-CM | POA: Diagnosis not present

## 2017-02-23 DIAGNOSIS — E118 Type 2 diabetes mellitus with unspecified complications: Secondary | ICD-10-CM | POA: Diagnosis not present

## 2017-02-23 DIAGNOSIS — Z789 Other specified health status: Secondary | ICD-10-CM

## 2017-02-23 DIAGNOSIS — E1159 Type 2 diabetes mellitus with other circulatory complications: Secondary | ICD-10-CM | POA: Diagnosis not present

## 2017-02-23 LAB — POCT GLYCOSYLATED HEMOGLOBIN (HGB A1C): Hemoglobin A1C: 6.2

## 2017-02-23 NOTE — Progress Notes (Signed)
  Subjective:    Patient ID: [name], female    DOB: 27-Jun-1945, 71 y.o.   MRN: [id number]  Jennifer PancoastMarita T Hoffman is a 71 y.o. female who presents for follow-up of Type 2 diabetes mellitus.  Home blood sugar records: fasting range: 100 Current symptoms/problems include none and have been unchanged. Daily foot checks:yes   Any foot concerns: none Exercise: The patient does not participate in regular exercise at present. Diet: Regular [facility] She does have bilateral cataracts and she also has a history of anterior  ischemic optic neuropathy. He is being followed regularly for that. She continues on metformin, Invokana without trouble. She does have a history of statin intolerance and presently is on fenofibrate. She does smoke and states she has been under a lot of stress dealing with her husband's illness which makes it difficult for her to quit. Last blood work was in June. The following portions of the patient's history were reviewed and updated as appropriate: allergies, current medications, past medical history, past social history and problem list.  ROS as in subjective above.     Objective:    Physical Exam Alert and in no distress otherwise not examined.  Blood pressure 120/68, pulse 77, height 5' 4.17" (1.63 m), weight 200 lb 9.6 oz (91 kg).  Lab Review Diabetic Labs Latest Ref Rng & Units 10/20/2016 06/18/2016 02/14/2016 08/05/2015 03/27/2015  HbA1c - 6.2% 6.4 6.3 6.5% 7.2(H)  Microalbumin Not estab mg/dL <7.8<0.2 - - 7.4 -  Micro/Creat Ratio <30 mcg/mg creat SEE NOTE - - 9.3 mg/g -  Chol <200 mg/dL 295(A211(H) - - 213(Y214(H) -  HDL >50 mg/dL 86(V38(L) - - 78(I39(L) -  Calc LDL <100 mg/dL 696(E122(H) - - 952129 -  Triglycerides <150 mg/dL 841(L256(H) - - 244(W228(H) -  Creatinine 0.50 - 0.99 mg/dL 1.020.86 - - 7.250.72 -   BP/Weight 02/23/2017 10/20/2016 06/18/2016 02/14/2016 08/05/2015  Systolic BP 120 114 116 100 124  Diastolic BP 68 68 72 60 60  Wt. (Lbs) 200.6 199.6 205 205 204    BMI 34.25 34.26 35.19 35.19 35   Foot/eye exam completion dates Latest Ref Rng & Units 10/20/2016 12/18/2015  Eye Exam No Retinopathy - No Retinopathy  Foot Form Completion - Done -    [name]  reports that she has been smoking.  She has never used smokeless tobacco. She reports that she does not drink alcohol or use drugs.     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 Rx changes: none 2. Education: Reviewed 'ABCs' of diabetes management (respective goals in parentheses):  A1C (<7), blood pressure (<130/80), and cholesterol (LDL <100). 3. Compliance at present is estimated to be fair. Efforts to improve compliance (if necessary) will be directed at increased exercise. 4. Follow up: 4 months She will continue to be followed for her underlying eye conditions. I did not push the point of her smoking cessation at the present time. She will continue on her other medications.

## 2017-05-10 ENCOUNTER — Other Ambulatory Visit: Payer: Self-pay | Admitting: Family Medicine

## 2017-05-10 DIAGNOSIS — E118 Type 2 diabetes mellitus with unspecified complications: Secondary | ICD-10-CM

## 2017-06-29 ENCOUNTER — Ambulatory Visit: Payer: PPO | Admitting: Family Medicine

## 2017-07-05 ENCOUNTER — Ambulatory Visit (INDEPENDENT_AMBULATORY_CARE_PROVIDER_SITE_OTHER): Payer: PPO | Admitting: Family Medicine

## 2017-07-05 ENCOUNTER — Encounter: Payer: Self-pay | Admitting: Family Medicine

## 2017-07-05 VITALS — BP 118/74 | HR 96 | Wt 199.2 lb

## 2017-07-05 DIAGNOSIS — E118 Type 2 diabetes mellitus with unspecified complications: Secondary | ICD-10-CM | POA: Diagnosis not present

## 2017-07-05 DIAGNOSIS — E1159 Type 2 diabetes mellitus with other circulatory complications: Secondary | ICD-10-CM

## 2017-07-05 DIAGNOSIS — E1169 Type 2 diabetes mellitus with other specified complication: Secondary | ICD-10-CM | POA: Diagnosis not present

## 2017-07-05 DIAGNOSIS — I10 Essential (primary) hypertension: Secondary | ICD-10-CM

## 2017-07-05 DIAGNOSIS — Z720 Tobacco use: Secondary | ICD-10-CM

## 2017-07-05 DIAGNOSIS — Z789 Other specified health status: Secondary | ICD-10-CM | POA: Diagnosis not present

## 2017-07-05 DIAGNOSIS — E669 Obesity, unspecified: Secondary | ICD-10-CM

## 2017-07-05 DIAGNOSIS — E785 Hyperlipidemia, unspecified: Secondary | ICD-10-CM | POA: Diagnosis not present

## 2017-07-05 LAB — POCT GLYCOSYLATED HEMOGLOBIN (HGB A1C): Hemoglobin A1C: 6.1

## 2017-07-05 MED ORDER — FENOFIBRATE 160 MG PO TABS: 160.0000 mg | ORAL_TABLET | Freq: Every day | ORAL | 3 refills | Status: DC

## 2017-07-05 MED ORDER — LOSARTAN POTASSIUM 25 MG PO TABS: 25.0000 mg | ORAL_TABLET | Freq: Every day | ORAL | 3 refills | Status: DC

## 2017-07-05 MED ORDER — CANAGLIFLOZIN 100 MG PO TABS: 100.0000 mg | ORAL_TABLET | Freq: Every day | ORAL | 1 refills | Status: DC

## 2017-07-05 NOTE — Progress Notes (Addendum)
  Subjective:    Patient ID: [name], female    DOB: 11/20/1945, 72 y.o.   MRN: [id number]  [name] is a 72 y.o. female who presents for follow-up of Type 2 diabetes mellitus.  Home blood sugar records: fasting range: 90 Current symptoms/problems include none and have been unchanged. Daily foot checks:   Any foot concerns: none Exercise: The patient does not participate in regular exercise at present. Diet: Unchanged She continues on Invokana, fenofibrate, Cozaar, metformin and has no difficulty with any of them.  She also taking a good multivitamin as well as omega-3.  She does continue to smoke   She is statin intolerant the following portions of the patient's history were reviewed and updated as appropriate: allergies, current medications, past medical history, past social history and problem list.  ROS as in subjective above.     Objective:    Physical Exam Alert and in no distress otherwise not examined.  Blood pressure 118/74, pulse 96, weight 199 lb 3.2 oz (90.4 kg), SpO2 96 %.  Lab Review Diabetic Labs Latest Ref Rng & Units 02/23/2017 10/20/2016 06/18/2016 02/14/2016 08/05/2015  HbA1c - 6.2 6.2% 6.4 6.3 6.5%  Microalbumin Not estab mg/dL - <4.0<0.2 - - 7.4  Micro/Creat Ratio <30 mcg/mg creat - SEE NOTE - - 9.3 mg/g  Chol <200 mg/dL - 981(X211(H) - - 914(N214(H)  HDL >50 mg/dL - 82(N38(L) - - 56(O39(L)  Calc LDL <100 mg/dL - 130(Q122(H) - - 657129  Triglycerides <150 mg/dL - 846(N256(H) - - 629(B228(H)  Creatinine 0.50 - 0.99 mg/dL - 2.840.86 - - 1.320.72   BP/Weight 07/05/2017 02/23/2017 10/20/2016 06/18/2016 02/14/2016  Systolic BP 118 120 114 116 100  Diastolic BP 74 68 68 72 60  Wt. (Lbs) 199.2 200.6 199.6 205 205  BMI 34.01 34.25 34.26 35.19 35.19   Foot/eye exam completion dates Latest Ref Rng & Units 10/20/2016 12/18/2015  Eye Exam No Retinopathy - No Retinopathy  Foot Form Completion - Done -  A1c is 6.1  [name]  reports that she has been smoking.  she has never used smokeless tobacco. She reports  that she does not drink alcohol or use drugs.     Assessment & Plan:    Hyperlipidemia associated with type 2 diabetes mellitus (HCC) - Plan: fenofibrate 160 MG tablet  Type 2 diabetes mellitus with complication, without long-term current use of insulin (HCC) - Plan: canagliflozin (INVOKANA) 100 MG TABS tablet, POCT glycosylated hemoglobin (Hb A1C)  Hypertension associated with diabetes (HCC) - Plan: losartan (COZAAR) 25 MG tablet   1. Rx changes: none 2. Education: Reviewed 'ABCs' of diabetes management (respective goals in parentheses):  A1C (<7), blood pressure (<130/80), and cholesterol (LDL <100). 3. Compliance at present is estimated to be fair. Efforts to improve compliance (if necessary) will be directed at increased exercise. 4. Follow up: 4 months

## 2017-08-06 ENCOUNTER — Encounter: Payer: Self-pay | Admitting: Medical

## 2017-08-06 ENCOUNTER — Ambulatory Visit (INDEPENDENT_AMBULATORY_CARE_PROVIDER_SITE_OTHER): Payer: PPO | Admitting: Medical

## 2017-08-06 VITALS — BP 120/70 | HR 86 | Temp 97.9°F | Ht 64.25 in | Wt 201.2 lb

## 2017-08-06 DIAGNOSIS — J019 Acute sinusitis, unspecified: Secondary | ICD-10-CM

## 2017-08-06 DIAGNOSIS — R42 Dizziness and giddiness: Secondary | ICD-10-CM | POA: Diagnosis not present

## 2017-08-06 MED ORDER — MECLIZINE HCL 25 MG PO TABS: 25.0000 mg | ORAL_TABLET | Freq: Two times a day (BID) | ORAL | 0 refills | Status: DC

## 2017-08-06 MED ORDER — AMOXICILLIN 875 MG PO TABS: 875.0000 mg | ORAL_TABLET | Freq: Two times a day (BID) | ORAL | 0 refills | Status: DC

## 2017-08-06 NOTE — Progress Notes (Signed)
Subjective: Chief Complaint  Patient presents with  . Acute Visit    dizziness, sore throat, and stopped up ear on right side, x2 days   Here for illness.  2 days ago felt dizziness when she got up in the morning.  Got sick to stomach.  Feels stopped up on right side in head, has sore throat.  No cough, no fever, no vomiting.  Has some nausea.    No numbness, no tingling, no weakness.   Took an allergy medication 2 times.   No sick contacts.  No other aggravating or relieving factors. No other complaint.  Past Medical History:  Diagnosis Date  . Allergy   . COPD (chronic obstructive pulmonary disease) (HCC)   . CVA (cerebral infarction)   . Diabetes mellitus without complication (HCC)   . Insomnia   . Nasal polyps   . Obesity    Current Outpatient Medications on File Prior to Visit  Medication Sig Dispense Refill  . aspirin 81 MG EC tablet Take 81 mg by mouth daily.      . canagliflozin (INVOKANA) 100 MG TABS tablet Take 1 tablet (100 mg total) by mouth daily. 90 tablet 1  . fenofibrate 160 MG tablet Take 1 tablet (160 mg total) by mouth daily. 90 tablet 3  . losartan (COZAAR) 25 MG tablet Take 1 tablet (25 mg total) by mouth daily. 90 tablet 3  . metFORMIN (GLUCOPHAGE) 500 MG tablet TAKE 1 TABLET BY MOUTH TWICE DAILY WITH A MEAL 180 tablet 1  . Multiple Vitamins-Minerals (MULTIVITAMIN WITH MINERALS) tablet Take 1 tablet by mouth daily.    . Omega-3 Fatty Acids (FISH OIL) 1200 MG CAPS Take 2 capsules by mouth daily.      Marland Kitchen zolpidem (AMBIEN) 5 MG tablet TAKE ONE TABLET BY MOUTH AT BEDTIME AS NEEDED FOR SLEEP 30 tablet 1   No current facility-administered medications on file prior to visit.    ROS as in subjective   Objective: BP 120/70 (BP Location: Right Arm, Patient Position: Sitting, Cuff Size: Normal)   Pulse 86   Temp 97.9 F (36.6 C) (Oral)   Ht 5' 4.25" (1.632 m)   Wt 201 lb 3.2 oz (91.3 kg)   SpO2 96%   BMI 34.27 kg/m   General appearance: Alert, WD/WN, no  distress                             Skin: warm, no rash                           Head: mild right maxillary sinus tenderness                            Eyes: conjunctiva normal, corneas clear, PERRLA                            Ears: left TM flat, mild erythema, right TM with 2mm perforation, otherwise normal, external ear canals normal                          Nose: septum midline, turbinates swollen, with erythema and mild mucoid discharge             Mouth/throat: MMM, tongue normal, mild pharyngeal erythema  Neck: supple, no adenopathy, no thyromegaly, non tender                          Heart: RRR, normal S1, S2, no murmurs                         Lungs: CTA bilaterally, no wheezes, rales, or rhonchi      Assessment: Encounter Diagnoses  Name Primary?  . Acute sinusitis, recurrence not specified, unspecified location Yes  . Dizziness      Plan: discussed symptoms, rest, hydration, begin medications below.  F/u if worse or not improving in the next 4-5 days.   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  [name] was seen today for acute visit.  Diagnoses and all orders for this visit:  Acute sinusitis, recurrence not specified, unspecified location  Dizziness  Other orders -     amoxicillin (AMOXIL) 875 MG tablet; Take 1 tablet (875 mg total) by mouth 2 (two) times daily. -     meclizine (ANTIVERT) 25 MG tablet; Take 1 tablet (25 mg total) by mouth 2 (two) times daily.

## 2017-10-12 ENCOUNTER — Ambulatory Visit (INDEPENDENT_AMBULATORY_CARE_PROVIDER_SITE_OTHER): Payer: PPO | Admitting: Family Medicine

## 2017-10-12 ENCOUNTER — Encounter: Payer: Self-pay | Admitting: Family Medicine

## 2017-10-12 VITALS — BP 110/68 | HR 76 | Temp 97.6°F | Ht 63.0 in | Wt 199.6 lb

## 2017-10-12 DIAGNOSIS — Z720 Tobacco use: Secondary | ICD-10-CM | POA: Diagnosis not present

## 2017-10-12 DIAGNOSIS — M8580 Other specified disorders of bone density and structure, unspecified site: Secondary | ICD-10-CM

## 2017-10-12 DIAGNOSIS — Z789 Other specified health status: Secondary | ICD-10-CM | POA: Diagnosis not present

## 2017-10-12 DIAGNOSIS — J309 Allergic rhinitis, unspecified: Secondary | ICD-10-CM

## 2017-10-12 DIAGNOSIS — E669 Obesity, unspecified: Secondary | ICD-10-CM | POA: Diagnosis not present

## 2017-10-12 DIAGNOSIS — E785 Hyperlipidemia, unspecified: Secondary | ICD-10-CM | POA: Diagnosis not present

## 2017-10-12 DIAGNOSIS — E1159 Type 2 diabetes mellitus with other circulatory complications: Secondary | ICD-10-CM | POA: Diagnosis not present

## 2017-10-12 DIAGNOSIS — E1169 Type 2 diabetes mellitus with other specified complication: Secondary | ICD-10-CM

## 2017-10-12 DIAGNOSIS — E118 Type 2 diabetes mellitus with unspecified complications: Secondary | ICD-10-CM

## 2017-10-12 DIAGNOSIS — I10 Essential (primary) hypertension: Secondary | ICD-10-CM

## 2017-10-12 DIAGNOSIS — Z0000 Encounter for general adult medical examination without abnormal findings: Secondary | ICD-10-CM | POA: Diagnosis not present

## 2017-10-12 DIAGNOSIS — E1136 Type 2 diabetes mellitus with diabetic cataract: Secondary | ICD-10-CM | POA: Diagnosis not present

## 2017-10-12 LAB — POCT GLYCOSYLATED HEMOGLOBIN (HGB A1C): Hemoglobin A1C: 6.1 % — AB (ref 4.0–5.6)

## 2017-10-12 LAB — POCT UA - MICROALBUMIN
Albumin/Creatinine Ratio, Urine, POC: <9.1
Creatinine, POC: 54.9 mg/dL
Microalbumin Ur, POC: <5 mg/L

## 2017-10-12 MED ORDER — METFORMIN HCL 500 MG PO TABS: ORAL_TABLET | ORAL | 1 refills | Status: DC

## 2017-10-12 NOTE — Progress Notes (Signed)
[name] is a 72 y.o. female who presents for annual wellness visit,CPE and follow-up on chronic medical conditions.  She does have underlying diabetes and presently is on Invokana as well as metformin.  Her previous hemoglobin A1c readings were quite good.  She states her blood sugars usually run in the 100-120 range.  She does check her feet regularly and has had an eye exam within the last year.  She does use fenofibrate for her lipids.  She does have statin intolerance.  She continues to smoke and is not interested in quitting.  She does have underlying allergies however they gave her very little difficulty.  She has a history of osteoporosis and her last DEXA was approximately 2 years ago.  She also had a mammogram about 2 years ago.  Her husband has had some recent back surgery which is limited there ability to travel.  Her mother died last 04-01-2023 at age 33.  She sees her ophthalmologist regularly for her cataracts.  No surgery is scheduled in the near future. Immunizations and Health Maintenance Immunization History  Administered Date(s) Administered  . Influenza Whole 04/21/2010  . Influenza, High Dose Seasonal PF 02/20/2013  . Influenza,inj,Quad PF,6+ Mos 02/23/2017  . Influenza-Unspecified 02/15/2014, 01/30/2016  . Pneumococcal Conjugate-13 02/20/2013  . Pneumococcal Polysaccharide-23 04/05/2006  . Pneumococcal-Unspecified 03/05/2015  . Tdap 09/14/2001, 03/05/2015  . Tetanus 03/27/2014  . Zoster 12/07/2013   Health Maintenance Due  Topic Date Due  . OPHTHALMOLOGY EXAM  12/17/2016    Last Pap smear: over five years Last mammogram:two years ago Last colonoscopy:7-8 years  Last DEXA:two years Dentist:may 2019 Ophtho: less than a year Exercise: yard work qd  Other doctors caring for patient include:  Advanced directives:    Depression screen:  See questionnaire below.  Depression screen [facility] 2/9 10/20/2016 08/05/2015 03/27/2014 02/20/2013  Decreased Interest 0 0 0 0   Down, Depressed, Hopeless 0 0 0 0  PHQ - 2 Score 0 0 0 0    Fall Risk Screen: see questionnaire below. Fall Risk  10/20/2016 08/05/2015 03/27/2014 02/20/2013  Falls in the past year? No No No No    ADL screen:  See questionnaire below Functional Status Survey:     Review of Systems Constitutional: -, -unexpected weight change, -anorexia, -fatigue Allergy: -sneezing, -itching, -congestion Dermatology: denies changing moles, rash, lumps ENT: -runny nose, -ear pain, -sore throat,  Cardiology:  -chest pain, -palpitations, -orthopnea, Respiratory: -cough, -shortness of breath, -dyspnea on exertion, -wheezing,  Gastroenterology: -abdominal pain, -nausea, -vomiting, -diarrhea, -constipation, -dysphagia Hematology: -bleeding or bruising problems Musculoskeletal: -arthralgias, -myalgias, -joint swelling, -back pain, - Ophthalmology: -vision changes,  Urology: -dysuria, -difficulty urinating,  -urinary frequency, -urgency, incontinence Neurology: -, -numbness, , -memory loss, -falls, -dizziness    PHYSICAL EXAM:  There were no vitals taken for this visit.  General Appearance: Alert, cooperative, no distress, appears stated age Head: Normocephalic, without obvious abnormality, atraumatic Eyes: PERRL, conjunctiva/corneas clear, EOM's intact, fundi benign Ears: Normal TM's and external ear canals Nose: Nares normal, mucosa normal, no drainage or sinus tenderness Throat: Lips, mucosa, and tongue normal; teeth and gums normal Neck: Supple, no lymphadenopathy;  thyroid:  no enlargement/tenderness/nodules; no carotid bruit or JVD Lungs: Clear to auscultation bilaterally without wheezes, rales or ronchi; respirations unlabored Heart: Regular rate and rhythm, S1 and S2 normal, no murmur, rubor gallop Abdomen: Soft, non-tender, nondistended, normoactive bowel sounds,  no masses, no hepatosplenomegaly Extremities: No clubbing, cyanosis or edema Pulses: 2+ and symmetric all extremities Skin:   Skin  color, texture, turgor normal, no rashes or lesions Lymph nodes: Cervical, supraclavicular, and axillary nodes normal Neurologic:  CNII-XII intact, normal strength, sensation and gait; reflexes 2+ and symmetric throughout Psych: Normal mood, affect, hygiene and grooming. A1c is 6.1 ASSESSMENT/PLAN: Obesity (BMI 30-39.9) - Plan: CBC with Differential/Platelet, Comprehensive metabolic panel, Lipid panel  Occasional cigarette smoker  Statin intolerance  Type 2 diabetes mellitus with complication, without long-term current use of insulin (HCC) - Plan: metFORMIN (GLUCOPHAGE) 500 MG tablet, CBC with Differential/Platelet, Comprehensive metabolic panel, Lipid panel, POCT UA - Microalbumin  Hyperlipidemia associated with type 2 diabetes mellitus (HCC) - Plan: Lipid panel  Hypertension associated with diabetes (HCC)  Osteopenia, unspecified location - Plan: DG Bone Density  Cataract associated with type 2 diabetes mellitus (HCC)  Allergic rhinitis, unspecified seasonality, unspecified trigger .  If she gets above 8 we might need to reevaluate this.  She was also instructed to get the Shingrix vaccine.  She will stop taking the Invokana.  Explained that her A1c will probably go up encouraged her to remain physically active.  Did not discuss smoking cessation as she is not ready to quit.  Medicare Attestation I have personally reviewed: The patient's medical and social history Their use of alcohol, tobacco or illicit drugs Their current medications and supplements The patient's functional ability including ADLs,fall risks, home safety risks, cognitive, and hearing and visual impairment Diet and physical activities Evidence for depression or mood disorders  The patient's weight, height, and BMI have been recorded in the chart.  I have made referrals, counseling, and provided education to the patient based on review of the above and I have provided the patient with a written personalized  care plan for preventive services.     Jennifer GowdaJohn Shareece Bultman, MD   10/12/2017

## 2017-10-13 LAB — COMPREHENSIVE METABOLIC PANEL
ALT: 25 IU/L (ref 0–32)
AST: 36 IU/L (ref 0–40)
Albumin/Globulin Ratio: 1.4 (ref 1.2–2.2)
Albumin: 4.5 g/dL (ref 3.6–4.8)
Alkaline Phosphatase: 39 IU/L (ref 39–117)
BUN/Creatinine Ratio: 26 (ref 12–28)
BUN: 20 mg/dL (ref 8–27)
Bilirubin Total: 0.4 mg/dL (ref 0.0–1.2)
CO2: 21 mmol/L (ref 20–29)
Calcium: 9.7 mg/dL (ref 8.7–10.3)
Chloride: 103 mmol/L (ref 96–106)
Creatinine, Ser: 0.76 mg/dL (ref 0.57–1.00)
GFR calc Af Amer: 93 mL/min/{1.73_m2} (ref >59)
GFR calc non Af Amer: 80 mL/min/{1.73_m2} (ref >59)
Globulin, Total: 3.3 g/dL (ref 1.5–4.5)
Glucose: 97 mg/dL (ref 65–99)
Potassium: 4.5 mmol/L (ref 3.5–5.2)
Sodium: 138 mmol/L (ref 134–144)
Total Protein: 7.8 g/dL (ref 6.0–8.5)

## 2017-10-13 LAB — LIPID PANEL
Chol/HDL Ratio: 5.5 ratio — ABNORMAL HIGH (ref 0.0–4.4)
Cholesterol, Total: 215 mg/dL — ABNORMAL HIGH (ref 100–199)
HDL: 39 mg/dL — ABNORMAL LOW (ref >39)
LDL Calculated: 126 mg/dL — ABNORMAL HIGH (ref 0–99)
Triglycerides: 250 mg/dL — ABNORMAL HIGH (ref 0–149)
VLDL Cholesterol Cal: 50 mg/dL — ABNORMAL HIGH (ref 5–40)

## 2017-10-13 LAB — CBC WITH DIFFERENTIAL/PLATELET
Basophils Absolute: 0 10*3/uL (ref 0.0–0.2)
Basos: 1 %
EOS (ABSOLUTE): 0.3 10*3/uL (ref 0.0–0.4)
Eos: 5 %
Hematocrit: 43.8 % (ref 34.0–46.6)
Hemoglobin: 15.2 g/dL (ref 11.1–15.9)
Immature Grans (Abs): 0 10*3/uL (ref 0.0–0.1)
Immature Granulocytes: 0 %
Lymphocytes Absolute: 2.4 10*3/uL (ref 0.7–3.1)
Lymphs: 43 %
MCH: 29.9 pg (ref 26.6–33.0)
MCHC: 34.7 g/dL (ref 31.5–35.7)
MCV: 86 fL (ref 79–97)
Monocytes Absolute: 0.4 10*3/uL (ref 0.1–0.9)
Monocytes: 6 %
Neutrophils Absolute: 2.6 10*3/uL (ref 1.4–7.0)
Neutrophils: 45 %
Platelets: 232 10*3/uL (ref 150–450)
RBC: 5.09 x10E6/uL (ref 3.77–5.28)
RDW: 14.5 % (ref 12.3–15.4)
WBC: 5.7 10*3/uL (ref 3.4–10.8)

## 2017-10-18 ENCOUNTER — Other Ambulatory Visit: Payer: Self-pay | Admitting: Family Medicine

## 2017-10-18 DIAGNOSIS — Z1231 Encounter for screening mammogram for malignant neoplasm of breast: Secondary | ICD-10-CM

## 2017-12-14 ENCOUNTER — Ambulatory Visit
Admission: RE | Admit: 2017-12-14 | Discharge: 2017-12-14 | Disposition: A | Discharge status: Home / Self Care | Payer: PPO | Source: Ambulatory Visit | Attending: Family Medicine | Admitting: Family Medicine

## 2017-12-14 DIAGNOSIS — Z780 Asymptomatic menopausal state: Secondary | ICD-10-CM | POA: Diagnosis not present

## 2017-12-14 DIAGNOSIS — Z1231 Encounter for screening mammogram for malignant neoplasm of breast: Secondary | ICD-10-CM

## 2017-12-14 DIAGNOSIS — Z13820 Encounter for screening for osteoporosis: Secondary | ICD-10-CM | POA: Diagnosis not present

## 2018-02-14 ENCOUNTER — Encounter: Payer: Self-pay | Admitting: Family Medicine

## 2018-02-14 ENCOUNTER — Ambulatory Visit (INDEPENDENT_AMBULATORY_CARE_PROVIDER_SITE_OTHER): Payer: PPO | Admitting: Family Medicine

## 2018-02-14 VITALS — BP 120/76 | HR 85 | Temp 97.5°F | Ht 63.75 in | Wt 206.4 lb

## 2018-02-14 DIAGNOSIS — E785 Hyperlipidemia, unspecified: Secondary | ICD-10-CM

## 2018-02-14 DIAGNOSIS — E118 Type 2 diabetes mellitus with unspecified complications: Secondary | ICD-10-CM

## 2018-02-14 DIAGNOSIS — E669 Obesity, unspecified: Secondary | ICD-10-CM

## 2018-02-14 DIAGNOSIS — E1169 Type 2 diabetes mellitus with other specified complication: Secondary | ICD-10-CM | POA: Diagnosis not present

## 2018-02-14 DIAGNOSIS — H8111 Benign paroxysmal vertigo, right ear: Secondary | ICD-10-CM | POA: Diagnosis not present

## 2018-02-14 DIAGNOSIS — I10 Essential (primary) hypertension: Secondary | ICD-10-CM

## 2018-02-14 DIAGNOSIS — Z720 Tobacco use: Secondary | ICD-10-CM | POA: Diagnosis not present

## 2018-02-14 DIAGNOSIS — E1159 Type 2 diabetes mellitus with other circulatory complications: Secondary | ICD-10-CM

## 2018-02-14 DIAGNOSIS — Z789 Other specified health status: Secondary | ICD-10-CM

## 2018-02-14 LAB — POCT GLYCOSYLATED HEMOGLOBIN (HGB A1C): Hemoglobin A1C: 6.8 % — AB (ref 4.0–5.6)

## 2018-02-14 MED ORDER — MECLIZINE HCL 25 MG PO TABS: 25.0000 mg | ORAL_TABLET | Freq: Two times a day (BID) | ORAL | 0 refills | Status: DC

## 2018-02-14 NOTE — Progress Notes (Signed)
  Subjective:    Patient ID: [name], female    DOB: February 25, 1946, 72 y.o.   MRN: [id number]  [name] is a 72 y.o. female who presents for follow-up of Type 2 diabetes mellitus.  She has a previous history of difficulty with vertigo and now notes occasional difficulty with dizziness especially when she turns her head to the right. She continues to smoke and is not interested in quitting.  Patient is checking home blood sugars.   100-120  Home blood sugar records: meter records 100-120 How often is blood sugars being checked: QD am or pm but always fasting or 2 hours post meal Current symptoms/problems include none and have been unchanged. Daily foot checks: yes   Any foot concerns: none Last eye exam: appt 04-11-18 Exercise: walking 6 hours a day She continues on metformin.  She is also taking fenofibrate and omega-3.  Continues on losartan and is having no difficulty with and these medications. The following portions of the patient's history were reviewed and updated as appropriate: allergies, current medications, past medical history, past social history and problem list.  ROS as in subjective above.     Objective:    Physical Exam Alert and in no distress otherwise not examined. A1c is 6.8.  Lab Review Diabetic Labs Latest Ref Rng & Units 10/12/2017 07/05/2017 02/23/2017 10/20/2016 06/18/2016  HbA1c 4.0 - 5.6 % 6.1(A) 6.1 6.2 6.2% 6.4  Microalbumin mg/L <5.0 - - <0.2 -  Micro/Creat Ratio - <9.1 - - SEE NOTE -  Chol 100 - 199 mg/dL 403(K) - - 742(V) -  HDL >39 mg/dL 95(G) - - 38(V) -  Calc LDL 0 - 99 mg/dL 564(P) - - 329(J) -  Triglycerides 0 - 149 mg/dL 188(C) - - 166(A) -  Creatinine 0.57 - 1.00 mg/dL 6.30 - - 1.60 -   BP/Weight 10/12/2017 08/06/2017 07/05/2017 02/23/2017 10/20/2016  Systolic BP 110 120 118 120 114  Diastolic BP 68 70 74 68 68  Wt. (Lbs) 199.6 201.2 199.2 200.6 199.6  BMI 35.36 34.27 34.01 34.25 34.26   Foot/eye exam completion dates Latest Ref Rng &  Units 10/20/2016 12/18/2015  Eye Exam No Retinopathy - No Retinopathy  Foot Form Completion - Done -    [name]  reports that she has been smoking. She has never used smokeless tobacco. She reports that she does not drink alcohol or use drugs.     Assessment & Plan:    Type 2 diabetes mellitus with complication, without long-term current use of insulin (HCC) - Plan: POCT glycosylated hemoglobin (Hb A1C)  Obesity (BMI 30-39.9)  Occasional cigarette smoker  Statin intolerance  Hyperlipidemia associated with type 2 diabetes mellitus (HCC)  Hypertension associated with diabetes (HCC)  Benign paroxysmal positional vertigo of right ear - Plan: meclizine (ANTIVERT) 25 MG tablet  1. Rx changes: none 2. Education: Reviewed 'ABCs' of diabetes management (respective goals in parentheses):  A1C (<7), blood pressure (<130/80), and cholesterol (LDL <100). 3. Compliance at present is estimated to be good. Efforts to improve compliance (if necessary) will be directed at increased exercise. 4. Follow up: 4 months Overall things are going well though when she did gain some weight.  She will make an effort to get back down to her regular weight.

## 2018-04-11 DIAGNOSIS — H47013 Ischemic optic neuropathy, bilateral: Secondary | ICD-10-CM | POA: Diagnosis not present

## 2018-04-11 DIAGNOSIS — H5203 Hypermetropia, bilateral: Secondary | ICD-10-CM | POA: Diagnosis not present

## 2018-04-11 DIAGNOSIS — H2513 Age-related nuclear cataract, bilateral: Secondary | ICD-10-CM | POA: Diagnosis not present

## 2018-04-11 DIAGNOSIS — H524 Presbyopia: Secondary | ICD-10-CM | POA: Diagnosis not present

## 2018-04-11 DIAGNOSIS — E119 Type 2 diabetes mellitus without complications: Secondary | ICD-10-CM | POA: Diagnosis not present

## 2018-04-11 LAB — HM DIABETES EYE EXAM

## 2018-04-22 DIAGNOSIS — H47013 Ischemic optic neuropathy, bilateral: Secondary | ICD-10-CM | POA: Insufficient documentation

## 2018-04-22 DIAGNOSIS — H524 Presbyopia: Secondary | ICD-10-CM

## 2018-04-22 DIAGNOSIS — H5203 Hypermetropia, bilateral: Secondary | ICD-10-CM | POA: Insufficient documentation

## 2018-04-22 DIAGNOSIS — H5200 Hypermetropia, unspecified eye: Secondary | ICD-10-CM | POA: Insufficient documentation

## 2018-05-13 ENCOUNTER — Other Ambulatory Visit: Payer: Self-pay | Admitting: Family Medicine

## 2018-05-13 DIAGNOSIS — E118 Type 2 diabetes mellitus with unspecified complications: Secondary | ICD-10-CM

## 2018-06-20 ENCOUNTER — Encounter: Payer: Self-pay | Admitting: Family Medicine

## 2018-06-20 ENCOUNTER — Ambulatory Visit (INDEPENDENT_AMBULATORY_CARE_PROVIDER_SITE_OTHER): Payer: PPO | Admitting: Family Medicine

## 2018-06-20 VITALS — BP 130/76 | HR 84 | Temp 98.0°F | Resp 16 | Ht 64.0 in | Wt 207.6 lb

## 2018-06-20 DIAGNOSIS — I152 Hypertension secondary to endocrine disorders: Secondary | ICD-10-CM

## 2018-06-20 DIAGNOSIS — E118 Type 2 diabetes mellitus with unspecified complications: Secondary | ICD-10-CM | POA: Diagnosis not present

## 2018-06-20 DIAGNOSIS — E1136 Type 2 diabetes mellitus with diabetic cataract: Secondary | ICD-10-CM | POA: Diagnosis not present

## 2018-06-20 DIAGNOSIS — I10 Essential (primary) hypertension: Secondary | ICD-10-CM | POA: Diagnosis not present

## 2018-06-20 DIAGNOSIS — E785 Hyperlipidemia, unspecified: Secondary | ICD-10-CM | POA: Diagnosis not present

## 2018-06-20 DIAGNOSIS — Z720 Tobacco use: Secondary | ICD-10-CM | POA: Diagnosis not present

## 2018-06-20 DIAGNOSIS — E1159 Type 2 diabetes mellitus with other circulatory complications: Secondary | ICD-10-CM

## 2018-06-20 DIAGNOSIS — E669 Obesity, unspecified: Secondary | ICD-10-CM

## 2018-06-20 DIAGNOSIS — E1169 Type 2 diabetes mellitus with other specified complication: Secondary | ICD-10-CM

## 2018-06-20 DIAGNOSIS — H47013 Ischemic optic neuropathy, bilateral: Secondary | ICD-10-CM | POA: Diagnosis not present

## 2018-06-20 DIAGNOSIS — Z789 Other specified health status: Secondary | ICD-10-CM | POA: Diagnosis not present

## 2018-06-20 LAB — POCT GLYCOSYLATED HEMOGLOBIN (HGB A1C): Hemoglobin A1C: 7.9 % — AB (ref 4.0–5.6)

## 2018-06-20 MED ORDER — FENOFIBRATE 160 MG PO TABS: 160.0000 mg | ORAL_TABLET | Freq: Every day | ORAL | 3 refills | Status: DC

## 2018-06-20 NOTE — Progress Notes (Signed)
Subjective:    Patient ID: [name], female    DOB: 03-04-1946, 73 y.o.   MRN: [id number]  [name] is a 73 y.o. female who presents for follow-up of Type 2 diabetes mellitus.  Home blood sugar records: fasting range: 150 Current symptoms/problems include none and have been unchanged. Daily foot checks:   Any foot concerns: None Exercise: Gym/ health club routine includes cardio.  She does this twice per week. Diet: Regular.  Recently she has been getting more than her share of baked goods made by her sister who is visiting. When she is taking metformin 500 mg but no Invokana.  6 months ago her A1c was 6.1 we changed her medications however she has lost ground since then.  She continues on fenofibrate and does have a history of statin intolerance.  She is on an omega-3 preparation.  She sees her eye doctor regularly.  She has no foot concerns.  She continues on losartan. The following portions of the patient's history were reviewed and updated as appropriate: allergies, current medications, past medical history, past social history and problem list.  ROS as in subjective above.     Objective:    Physical Exam Alert and in no distress otherwise not examined.  Blood pressure 130/76, pulse 84, temperature 98 F (36.7 C), temperature source Oral, resp. rate 16, height 5\' 4"  (1.626 m), weight 207 lb 9.6 oz (94.2 kg), SpO2 94 %.  Lab Review Diabetic Labs Latest Ref Rng & Units 06/20/2018 02/14/2018 10/12/2017 07/05/2017 02/23/2017  HbA1c 4.0 - 5.6 % 7.9(A) 6.8(A) 6.1(A) 6.1 6.2  Microalbumin mg/L - - <5.0 - -  Micro/Creat Ratio - - - <9.1 - -  Chol 100 - 199 mg/dL - - 993(Z) - -  HDL >16 mg/dL - - 96(V) - -  Calc LDL 0 - 99 mg/dL - - 893(Y) - -  Triglycerides 0 - 149 mg/dL - - 101(B) - -  Creatinine 0.57 - 1.00 mg/dL - - 5.10 - -   BP/Weight 06/20/2018 02/14/2018 10/12/2017 08/06/2017 07/05/2017  Systolic BP 130 120 110 120 118  Diastolic BP 76 76 68 70 74  Wt. (Lbs) 207.6 206.4  199.6 201.2 199.2  BMI 35.63 35.71 35.36 34.27 34.01   Foot/eye exam completion dates Latest Ref Rng & Units 04/11/2018 02/14/2018  Eye Exam No Retinopathy No Retinopathy -  Foot Form Completion - - Done  A1c is 7.9  [name]  reports that she has been smoking. She has never used smokeless tobacco. She reports that she does not drink alcohol or use drugs.     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 Rx changes: none 2. Education: Reviewed 'ABCs' of diabetes management (respective goals in parentheses):  A1C (<7), blood pressure (<130/80), and cholesterol (LDL <100). 3. Compliance at present is estimated to be fair. Efforts to improve compliance (if necessary) will be directed at increased exercise. 4. Follow up: 4 months I discussed the fact that her A1c is heading in the wrong direction.  Discussed placing her back  on Invokana and increasing the metformin however she would like to see if she can make diet and exercise changes before we do that.  Encouraged her to continue with going to [employer] sne[employer] twice per week but then walk for 20 minutes the other 5 days.  She will schedule for follow-up appointment including Medicare wellness/CPE.

## 2018-07-26 ENCOUNTER — Other Ambulatory Visit: Payer: Self-pay | Admitting: Family Medicine

## 2018-07-26 DIAGNOSIS — E1159 Type 2 diabetes mellitus with other circulatory complications: Secondary | ICD-10-CM

## 2018-07-26 DIAGNOSIS — I10 Essential (primary) hypertension: Principal | ICD-10-CM

## 2018-07-26 DIAGNOSIS — I152 Hypertension secondary to endocrine disorders: Secondary | ICD-10-CM

## 2018-07-26 DIAGNOSIS — E118 Type 2 diabetes mellitus with unspecified complications: Secondary | ICD-10-CM

## 2018-08-01 ENCOUNTER — Other Ambulatory Visit: Payer: Self-pay | Admitting: Family Medicine

## 2018-08-01 DIAGNOSIS — I152 Hypertension secondary to endocrine disorders: Secondary | ICD-10-CM

## 2018-08-01 DIAGNOSIS — E1159 Type 2 diabetes mellitus with other circulatory complications: Secondary | ICD-10-CM

## 2018-08-01 DIAGNOSIS — I10 Essential (primary) hypertension: Principal | ICD-10-CM

## 2018-08-10 ENCOUNTER — Other Ambulatory Visit: Payer: Self-pay | Admitting: Family Medicine

## 2018-08-10 DIAGNOSIS — E118 Type 2 diabetes mellitus with unspecified complications: Secondary | ICD-10-CM

## 2018-10-18 ENCOUNTER — Other Ambulatory Visit: Payer: Self-pay

## 2018-10-18 ENCOUNTER — Ambulatory Visit (INDEPENDENT_AMBULATORY_CARE_PROVIDER_SITE_OTHER): Payer: PPO | Admitting: Family Medicine

## 2018-10-18 ENCOUNTER — Encounter: Payer: Self-pay | Admitting: Family Medicine

## 2018-10-18 VITALS — BP 124/86 | HR 78 | Temp 98.3°F | Ht 64.0 in | Wt 209.6 lb

## 2018-10-18 DIAGNOSIS — H47013 Ischemic optic neuropathy, bilateral: Secondary | ICD-10-CM | POA: Diagnosis not present

## 2018-10-18 DIAGNOSIS — Z720 Tobacco use: Secondary | ICD-10-CM | POA: Diagnosis not present

## 2018-10-18 DIAGNOSIS — M8580 Other specified disorders of bone density and structure, unspecified site: Secondary | ICD-10-CM

## 2018-10-18 DIAGNOSIS — H7291 Unspecified perforation of tympanic membrane, right ear: Secondary | ICD-10-CM

## 2018-10-18 DIAGNOSIS — E1159 Type 2 diabetes mellitus with other circulatory complications: Secondary | ICD-10-CM

## 2018-10-18 DIAGNOSIS — E1169 Type 2 diabetes mellitus with other specified complication: Secondary | ICD-10-CM | POA: Diagnosis not present

## 2018-10-18 DIAGNOSIS — G479 Sleep disorder, unspecified: Secondary | ICD-10-CM | POA: Diagnosis not present

## 2018-10-18 DIAGNOSIS — E669 Obesity, unspecified: Secondary | ICD-10-CM

## 2018-10-18 DIAGNOSIS — E118 Type 2 diabetes mellitus with unspecified complications: Secondary | ICD-10-CM

## 2018-10-18 DIAGNOSIS — E785 Hyperlipidemia, unspecified: Secondary | ICD-10-CM

## 2018-10-18 DIAGNOSIS — I152 Hypertension secondary to endocrine disorders: Secondary | ICD-10-CM

## 2018-10-18 DIAGNOSIS — E1136 Type 2 diabetes mellitus with diabetic cataract: Secondary | ICD-10-CM | POA: Diagnosis not present

## 2018-10-18 DIAGNOSIS — Z0000 Encounter for general adult medical examination without abnormal findings: Secondary | ICD-10-CM

## 2018-10-18 DIAGNOSIS — Z789 Other specified health status: Secondary | ICD-10-CM | POA: Diagnosis not present

## 2018-10-18 DIAGNOSIS — Z79899 Other long term (current) drug therapy: Secondary | ICD-10-CM

## 2018-10-18 DIAGNOSIS — I10 Essential (primary) hypertension: Secondary | ICD-10-CM

## 2018-10-18 DIAGNOSIS — H5203 Hypermetropia, bilateral: Secondary | ICD-10-CM | POA: Diagnosis not present

## 2018-10-18 LAB — POCT UA - MICROALBUMIN
Albumin/Creatinine Ratio, Urine, POC: <5.6
Creatinine, POC: 89 mg/dL
Microalbumin Ur, POC: <5 mg/L

## 2018-10-18 LAB — POCT GLYCOSYLATED HEMOGLOBIN (HGB A1C): Hemoglobin A1C: 7.2 % — AB (ref 4.0–5.6)

## 2018-10-18 LAB — LIPID PANEL

## 2018-10-18 MED ORDER — ZOLPIDEM TARTRATE 5 MG PO TABS: 5.0000 mg | ORAL_TABLET | Freq: Every evening | ORAL | 1 refills | Status: DC | PRN

## 2018-10-18 NOTE — Patient Instructions (Signed)
  [name] , Thank you for taking time to come for your Medicare Wellness Visit. I appreciate your ongoing commitment to your health goals. Please review the following plan we discussed and let me know if I can assist you in the future.   These are the goals we discussed:   This is a list of the screening recommended for you and due dates:  Health Maintenance  Topic Date Due  . Flu Shot  12/03/2018  . Hemoglobin A1C  12/19/2018  . Complete foot exam   02/15/2019  . Eye exam for diabetics  04/12/2019  . Mammogram  12/15/2019  . Colon Cancer Screening  07/15/2023  . Tetanus Vaccine  03/04/2025  . DEXA scan (bone density measurement)  Completed  .  Hepatitis C: One time screening is recommended by Center for Disease Control  (CDC) for  adults born from [birth year] through 1963.   Completed  . Pneumonia vaccines  Completed

## 2018-10-18 NOTE — Progress Notes (Signed)
[name] is a 73 y.o. female who presents for annual wellness visit,CPE and follow-up on chronic medical conditions.  She continues on metformin for her diabetes.  Presently she is not using Invokana.  She continues on fenofibrate as well as losartan and a baby aspirin.  She continues to smoke and is not interested in quitting.  She does check her feet regularly.  Her physical activity is minimal.  She is taking care of her son who has type 1 diabetes as well as her husband.  She does occasionally use Ambien to help with sleep.  She has stopped taking omega-3 and multivitamin.  She follows up regularly with ophthalmology.  She does state that if she sits for long periods of time she will note a rash occurring on her lower extremities but it does not seem to appear on her feet.  She has had difficulty with this for several years.   family and social history is unchanged. Immunizations and Health Maintenance Immunization History  Administered Date(s) Administered  . Influenza Whole 04/21/2010  . Influenza, High Dose Seasonal PF 02/20/2013  . Influenza,inj,Quad PF,6+ Mos 02/23/2017  . Influenza-Unspecified 02/15/2014, 01/30/2016, 02/07/2018  . Pneumococcal Conjugate-13 02/20/2013  . Pneumococcal Polysaccharide-23 04/05/2006  . Pneumococcal-Unspecified 03/05/2015  . Tdap 09/14/2001, 03/05/2015  . Tetanus 03/27/2014  . Zoster 12/07/2013  . Zoster Recombinat (Shingrix) 10/12/2017   There are no preventive care reminders to display for this patient.  Last Pap smear: aged out  Last mammogram: 8/13/ 2019 Last colonoscopy: 07/14/13 Last DEXA:12/14/17 Dentist: two weeks ago Ophtho: 2019 Exercise: walking around house and yard  [facility] caring for patient [facility],   Advanced directives:yes . In chart    Depression screen:  See questionnaire below.  [facility] 2/9 02/14/2018 10/20/2016 08/05/2015 03/27/2014 02/20/2013  Decreased Interest 0 0 0 0 0  Down,  Depressed, Hopeless 0 0 0 0 0  PHQ - 2 Score 0 0 0 0 0    Fall Risk Screen: see questionnaire below. Fall Risk  02/14/2018 10/20/2016 08/05/2015 03/27/2014 02/20/2013  Falls in the past year? No No No No No    ADL screen:  See questionnaire below Functional Status Survey:     Review of Systems Constitutional: -, -unexpected weight change, -anorexia, -fatigue Allergy: -sneezing, -itching, -congestion Dermatology: denies changing moles, rash, lumps ENT: -runny nose, -ear pain, -sore throat,  Cardiology:  -chest pain, -palpitations, -orthopnea, Respiratory: -cough, -shortness of breath, -dyspnea on exertion, -wheezing,  Gastroenterology: -abdominal pain, -nausea, -vomiting, -diarrhea, -constipation, -dysphagia Hematology: -bleeding or bruising problems petechiae present over distal shin area but not on her feet. Musculoskeletal: -arthralgias, -myalgias, -joint swelling, -back pain, -  Urology: -dysuria, -difficulty urinating,  -urinary frequency, -urgency, incontinence Neurology: -, -numbness, , -memory loss, -falls, -dizziness    PHYSICAL EXAM:   General Appearance: Alert, cooperative, no distress, appears stated age Head: Normocephalic, without obvious abnormality, atraumatic Eyes: PERRL, conjunctiva/corneas clear, EOM's intact, fundi benign Ears: Right TM has a perforation which is old, left normal and external ear canals Nose: Nares normal, mucosa normal, no drainage or sinus tenderness Throat: Lips, mucosa, and tongue normal; teeth and gums normal Neck: Supple, no lymphadenopathy;  thyroid:  no enlargement/tenderness/nodules; no carotid bruit or JVD Lungs: Clear to auscultation bilaterally without wheezes, rales or ronchi; respirations unlabored Heart: Regular rate and rhythm, S1 and S2 normal, no murmur, rubor gallop Abdomen: Soft, non-tender, nondistended, normoactive bowel sounds,  no masses, no hepatosplenomegaly Extremities: No clubbing, cyanosis or edema Pulses:  2+  and symmetric all extremities Skin:  Skin color, texture, turgor normal, petechiae present on lower extremities Lymph nodes: Cervical, supraclavicular, and axillary nodes normal Neurologic:  CNII-XII intact, normal strength, sensation and gait; reflexes 2+ and symmetric throughout Psych: Normal mood, affect, hygiene and grooming. Hemoglobin A1c is 7.2 ASSESSMENT/PLAN: Encounter Diagnoses  Name Primary?  . Encounter for long-term (current) use of medications   . 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 Cataract associated with type 2 diabetes mellitus ([place])   . Diabetes mellitus type 2 with complications ([place])   . Hyperlipidemia associated with type 2 diabetes mellitus ([place])   . Hypermetropia of both eyes   . Hypertension associated with diabetes ([place])   . Obesity (BMI 30-39.9)   . Occasional cigarette smoker   . Osteopenia, unspecified location   . Statin intolerance   . Sleep disturbance   . Routine general medical examination at a health care facility Yes  . Tympanic membrane perforation, right   I discussed the rash with her and explained that this is probably capillary fragility and not of any major concern since this is been going on for several years. She will follow-up getting her second Shingrix shot. Continue on her present medications.  Since her A1c is doing much better, I will continue to recommend she take good care of herself.      Immunization recommendations discussed.  Colonoscopy recommendations reviewed   Medicare Attestation I have personally reviewed: The patient's medical and social history Their use of alcohol, tobacco or illicit drugs Their current medications and supplements The patient's functional ability including ADLs,fall risks, home safety risks, cognitive, and hearing and visual impairment Diet and physical activities Evidence for depression or mood disorders  The patient's weight, height, and BMI have been recorded in the chart.  I  have made referrals, counseling, and provided education to the patient based on review of the above and I have provided the patient with a written personalized care plan for preventive services.     Jill Alexanders, MD   10/18/2018

## 2018-10-19 LAB — CBC WITH DIFFERENTIAL/PLATELET
Basophils Absolute: 0 10*3/uL (ref 0.0–0.2)
Basos: 1 %
EOS (ABSOLUTE): 0.2 10*3/uL (ref 0.0–0.4)
Eos: 4 %
Hematocrit: 44.2 % (ref 34.0–46.6)
Hemoglobin: 14.8 g/dL (ref 11.1–15.9)
Immature Grans (Abs): 0 10*3/uL (ref 0.0–0.1)
Immature Granulocytes: 0 %
Lymphocytes Absolute: 2 10*3/uL (ref 0.7–3.1)
Lymphs: 39 %
MCH: 30 pg (ref 26.6–33.0)
MCHC: 33.5 g/dL (ref 31.5–35.7)
MCV: 90 fL (ref 79–97)
Monocytes Absolute: 0.3 10*3/uL (ref 0.1–0.9)
Monocytes: 7 %
Neutrophils Absolute: 2.5 10*3/uL (ref 1.4–7.0)
Neutrophils: 49 %
Platelets: 207 10*3/uL (ref 150–450)
RBC: 4.93 x10E6/uL (ref 3.77–5.28)
RDW: 13.3 % (ref 11.7–15.4)
WBC: 5 10*3/uL (ref 3.4–10.8)

## 2018-10-19 LAB — COMPREHENSIVE METABOLIC PANEL
ALT: 26 IU/L (ref 0–32)
AST: 36 IU/L (ref 0–40)
Albumin/Globulin Ratio: 1.3 (ref 1.2–2.2)
Albumin: 4.4 g/dL (ref 3.8–4.8)
Alkaline Phosphatase: 42 IU/L (ref 39–117)
BUN/Creatinine Ratio: 19 (ref 12–28)
BUN: 15 mg/dL (ref 8–27)
Bilirubin Total: 0.5 mg/dL (ref 0.0–1.2)
CO2: 21 mmol/L (ref 20–29)
Calcium: 9.2 mg/dL (ref 8.7–10.3)
Chloride: 101 mmol/L (ref 96–106)
Creatinine, Ser: 0.77 mg/dL (ref 0.57–1.00)
GFR calc Af Amer: 90 mL/min/{1.73_m2} (ref >59)
GFR calc non Af Amer: 78 mL/min/{1.73_m2} (ref >59)
Globulin, Total: 3.5 g/dL (ref 1.5–4.5)
Glucose: 133 mg/dL — ABNORMAL HIGH (ref 65–99)
Potassium: 4.7 mmol/L (ref 3.5–5.2)
Sodium: 135 mmol/L (ref 134–144)
Total Protein: 7.9 g/dL (ref 6.0–8.5)

## 2018-10-19 LAB — LIPID PANEL
Chol/HDL Ratio: 5.2 ratio — ABNORMAL HIGH (ref 0.0–4.4)
Cholesterol, Total: 204 mg/dL — ABNORMAL HIGH (ref 100–199)
HDL: 39 mg/dL — ABNORMAL LOW (ref >39)
LDL Calculated: 125 mg/dL — ABNORMAL HIGH (ref 0–99)
Triglycerides: 201 mg/dL — ABNORMAL HIGH (ref 0–149)
VLDL Cholesterol Cal: 40 mg/dL (ref 5–40)

## 2018-11-01 ENCOUNTER — Telehealth: Payer: Self-pay

## 2018-11-01 DIAGNOSIS — E1159 Type 2 diabetes mellitus with other circulatory complications: Secondary | ICD-10-CM

## 2018-11-01 MED ORDER — LOSARTAN POTASSIUM 25 MG PO TABS: 25.0000 mg | ORAL_TABLET | Freq: Every day | ORAL | 0 refills | Status: DC

## 2018-11-01 NOTE — Telephone Encounter (Signed)
[facility] sent a fax stating patient is wanting to get Losartan with them instead of [facility].

## 2019-01-17 ENCOUNTER — Other Ambulatory Visit: Payer: Self-pay | Admitting: Family Medicine

## 2019-01-17 DIAGNOSIS — Z1231 Encounter for screening mammogram for malignant neoplasm of breast: Secondary | ICD-10-CM

## 2019-01-19 ENCOUNTER — Other Ambulatory Visit: Payer: Self-pay

## 2019-01-19 ENCOUNTER — Ambulatory Visit
Admission: RE | Admit: 2019-01-19 | Discharge: 2019-01-19 | Disposition: A | Discharge status: Home / Self Care | Payer: PPO | Source: Ambulatory Visit | Attending: Family Medicine | Admitting: Family Medicine

## 2019-01-19 DIAGNOSIS — Z1231 Encounter for screening mammogram for malignant neoplasm of breast: Secondary | ICD-10-CM

## 2019-01-24 ENCOUNTER — Other Ambulatory Visit: Payer: Self-pay | Admitting: Family Medicine

## 2019-01-24 DIAGNOSIS — E1159 Type 2 diabetes mellitus with other circulatory complications: Secondary | ICD-10-CM

## 2019-01-31 ENCOUNTER — Other Ambulatory Visit: Payer: Self-pay | Admitting: Family Medicine

## 2019-01-31 DIAGNOSIS — E118 Type 2 diabetes mellitus with unspecified complications: Secondary | ICD-10-CM

## 2019-02-01 MED ORDER — METFORMIN HCL 500 MG PO TABS: ORAL_TABLET | ORAL | 0 refills | Status: DC

## 2019-02-20 ENCOUNTER — Encounter: Payer: Self-pay | Admitting: Family Medicine

## 2019-02-20 ENCOUNTER — Ambulatory Visit (INDEPENDENT_AMBULATORY_CARE_PROVIDER_SITE_OTHER): Payer: PPO | Admitting: Family Medicine

## 2019-02-20 ENCOUNTER — Other Ambulatory Visit: Payer: Self-pay

## 2019-02-20 VITALS — BP 112/70 | HR 90 | Temp 98.7°F | Ht 64.0 in | Wt 210.8 lb

## 2019-02-20 DIAGNOSIS — I152 Hypertension secondary to endocrine disorders: Secondary | ICD-10-CM

## 2019-02-20 DIAGNOSIS — E669 Obesity, unspecified: Secondary | ICD-10-CM

## 2019-02-20 DIAGNOSIS — E1136 Type 2 diabetes mellitus with diabetic cataract: Secondary | ICD-10-CM | POA: Diagnosis not present

## 2019-02-20 DIAGNOSIS — H47013 Ischemic optic neuropathy, bilateral: Secondary | ICD-10-CM

## 2019-02-20 DIAGNOSIS — Z789 Other specified health status: Secondary | ICD-10-CM | POA: Diagnosis not present

## 2019-02-20 DIAGNOSIS — Z720 Tobacco use: Secondary | ICD-10-CM | POA: Diagnosis not present

## 2019-02-20 DIAGNOSIS — I10 Essential (primary) hypertension: Secondary | ICD-10-CM

## 2019-02-20 DIAGNOSIS — E1169 Type 2 diabetes mellitus with other specified complication: Secondary | ICD-10-CM

## 2019-02-20 DIAGNOSIS — E785 Hyperlipidemia, unspecified: Secondary | ICD-10-CM

## 2019-02-20 DIAGNOSIS — E1159 Type 2 diabetes mellitus with other circulatory complications: Secondary | ICD-10-CM | POA: Diagnosis not present

## 2019-02-20 DIAGNOSIS — E118 Type 2 diabetes mellitus with unspecified complications: Secondary | ICD-10-CM | POA: Diagnosis not present

## 2019-02-20 LAB — POCT GLYCOSYLATED HEMOGLOBIN (HGB A1C): Hemoglobin A1C: 7.8 % — AB (ref 4.0–5.6)

## 2019-02-20 NOTE — Addendum Note (Signed)
Addended by: Elyse Jarvis on: 02/20/2019 11:59 AM   Modules accepted: Orders

## 2019-02-20 NOTE — Progress Notes (Signed)
  Subjective:    Patient ID: [name], female    DOB: 07-01-1945, 73 y.o.   MRN: [id number]  [name] is a 73 y.o. female who presents for follow-up of Type 2 diabetes mellitus. Home blood sugar records: meter record 140- 180 avg Current symptoms/problems include none at this time. Daily foot checks: yes   Any foot concerns: none Exercise: not much due to covid.  She admits that she is allowed Covid to interfere with her regular exercise pattern. Diet: healthy diet.  She does admit to dietary indiscretion as well She does have a history of statin intolerance.  She continues on metformin and having no difficulty with that.  She is also taking losartan.  She is on the fenofibrate.  Rarely uses Ambien.  She has a follow-up visit with the ophthalmologist at [facility] in February. The following portions of the patient's history were reviewed and updated as appropriate: allergies, current medications, past medical history, past social history and problem list.  ROS as in subjective above.     Objective:    Physical Exam Alert and in no distress otherwise not examined. Hemoglobin A1c 7.8 Lab Review Diabetic Labs Latest Ref Rng & Units 10/18/2018 06/20/2018 02/14/2018 10/12/2017 07/05/2017  HbA1c 4.0 - 5.6 % 7.2(A) 7.9(A) 6.8(A) 6.1(A) 6.1  Microalbumin mg/L <5.0 - - <5.0 -  Micro/Creat Ratio - <5.6 - - <9.1 -  Chol 100 - 199 mg/dL 204(H) - - 215(H) -  HDL >39 mg/dL 39(L) - - 39(L) -  Calc LDL 0 - 99 mg/dL 125(H) - - 126(H) -  Triglycerides 0 - 149 mg/dL 201(H) - - 250(H) -  Creatinine 0.57 - 1.00 mg/dL 0.77 - - 0.76 -   BP/Weight 10/18/2018 06/20/2018 02/14/2018 3/53/2992 08/03/6832  Systolic BP 196 222 979 892 119  Diastolic BP 86 76 76 68 70  Wt. (Lbs) 209.6 207.6 206.4 199.6 201.2  BMI 35.98 35.63 35.71 35.36 34.27   Foot/eye exam completion dates Latest Ref Rng & Units 10/18/2018 04/11/2018  Eye Exam No Retinopathy - No Retinopathy  Foot Form Completion - Done -    [name]   reports that she has been smoking. She has never used smokeless tobacco. She reports that she does not drink alcohol or use drugs.     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 Rx changes: none I did discuss possibly placing her back on Invokana or a drug of that class for the cardiac benefits.  She would like to hold off on that. 2. Education: Reviewed 'ABCs' of diabetes management (respective goals in parentheses):  A1C (<7), blood pressure (<130/80), and cholesterol (LDL <100). 3. Compliance at present is estimated to be fair. Efforts to improve compliance (if necessary) will be directed at increased exercise.  Strongly encouraged her to get back into a regular exercise program even though she cannot get back into the gym.  Still not interested in quitting smoking.  Follow up: 4 months

## 2019-03-01 ENCOUNTER — Ambulatory Visit: Payer: PPO

## 2019-03-09 DIAGNOSIS — Z8371 Family history of colonic polyps: Secondary | ICD-10-CM | POA: Diagnosis not present

## 2019-03-09 DIAGNOSIS — K5730 Diverticulosis of large intestine without perforation or abscess without bleeding: Secondary | ICD-10-CM | POA: Diagnosis not present

## 2019-03-09 DIAGNOSIS — Z1211 Encounter for screening for malignant neoplasm of colon: Secondary | ICD-10-CM | POA: Diagnosis not present

## 2019-03-09 DIAGNOSIS — R194 Change in bowel habit: Secondary | ICD-10-CM | POA: Diagnosis not present

## 2019-03-23 ENCOUNTER — Other Ambulatory Visit: Payer: Self-pay | Admitting: Family Medicine

## 2019-03-23 DIAGNOSIS — I152 Hypertension secondary to endocrine disorders: Secondary | ICD-10-CM

## 2019-03-23 DIAGNOSIS — E118 Type 2 diabetes mellitus with unspecified complications: Secondary | ICD-10-CM

## 2019-03-23 DIAGNOSIS — E1159 Type 2 diabetes mellitus with other circulatory complications: Secondary | ICD-10-CM

## 2019-03-27 DIAGNOSIS — R194 Change in bowel habit: Secondary | ICD-10-CM | POA: Diagnosis not present

## 2019-03-27 DIAGNOSIS — Z1211 Encounter for screening for malignant neoplasm of colon: Secondary | ICD-10-CM | POA: Diagnosis not present

## 2019-03-27 DIAGNOSIS — K5730 Diverticulosis of large intestine without perforation or abscess without bleeding: Secondary | ICD-10-CM | POA: Diagnosis not present

## 2019-03-27 LAB — HM COLONOSCOPY

## 2019-04-11 ENCOUNTER — Encounter: Payer: Self-pay | Admitting: Family Medicine

## 2019-04-11 DIAGNOSIS — M791 Myalgia, unspecified site: Secondary | ICD-10-CM

## 2019-04-11 DIAGNOSIS — T466X5A Adverse effect of antihyperlipidemic and antiarteriosclerotic drugs, initial encounter: Secondary | ICD-10-CM

## 2019-05-29 DIAGNOSIS — M0609 Rheumatoid arthritis without rheumatoid factor, multiple sites: Secondary | ICD-10-CM | POA: Insufficient documentation

## 2019-05-30 ENCOUNTER — Ambulatory Visit: Payer: PPO

## 2019-05-31 ENCOUNTER — Other Ambulatory Visit: Payer: Self-pay

## 2019-05-31 ENCOUNTER — Encounter: Payer: Self-pay | Admitting: Family Medicine

## 2019-05-31 ENCOUNTER — Ambulatory Visit (INDEPENDENT_AMBULATORY_CARE_PROVIDER_SITE_OTHER): Payer: PPO | Admitting: Family Medicine

## 2019-05-31 VITALS — BP 110/68 | HR 83 | Temp 97.5°F | Wt 209.2 lb

## 2019-05-31 DIAGNOSIS — M1990 Unspecified osteoarthritis, unspecified site: Secondary | ICD-10-CM | POA: Diagnosis not present

## 2019-05-31 LAB — COMPREHENSIVE METABOLIC PANEL
ALT: 30 IU/L (ref 0–32)
AST: 42 IU/L — ABNORMAL HIGH (ref 0–40)
Albumin/Globulin Ratio: 1.3 (ref 1.2–2.2)
Albumin: 4.4 g/dL (ref 3.7–4.7)
Alkaline Phosphatase: 44 IU/L (ref 39–117)
BUN/Creatinine Ratio: 19 (ref 12–28)
BUN: 16 mg/dL (ref 8–27)
Bilirubin Total: 0.5 mg/dL (ref 0.0–1.2)
CO2: 19 mmol/L — ABNORMAL LOW (ref 20–29)
Calcium: 9.2 mg/dL (ref 8.7–10.3)
Chloride: 100 mmol/L (ref 96–106)
Creatinine, Ser: 0.86 mg/dL (ref 0.57–1.00)
GFR calc Af Amer: 79 mL/min/{1.73_m2} (ref >59)
GFR calc non Af Amer: 68 mL/min/{1.73_m2} (ref >59)
Globulin, Total: 3.4 g/dL (ref 1.5–4.5)
Glucose: 188 mg/dL — ABNORMAL HIGH (ref 65–99)
Potassium: 4.5 mmol/L (ref 3.5–5.2)
Sodium: 136 mmol/L (ref 134–144)
Total Protein: 7.8 g/dL (ref 6.0–8.5)

## 2019-05-31 LAB — CBC WITH DIFFERENTIAL/PLATELET
Basophils Absolute: 0 10*3/uL (ref 0.0–0.2)
Basos: 1 %
EOS (ABSOLUTE): 0.2 10*3/uL (ref 0.0–0.4)
Eos: 3 %
Hematocrit: 43.5 % (ref 34.0–46.6)
Hemoglobin: 14.5 g/dL (ref 11.1–15.9)
Immature Grans (Abs): 0 10*3/uL (ref 0.0–0.1)
Immature Granulocytes: 0 %
Lymphocytes Absolute: 1.8 10*3/uL (ref 0.7–3.1)
Lymphs: 30 %
MCH: 29.3 pg (ref 26.6–33.0)
MCHC: 33.3 g/dL (ref 31.5–35.7)
MCV: 88 fL (ref 79–97)
Monocytes Absolute: 0.4 10*3/uL (ref 0.1–0.9)
Monocytes: 6 %
Neutrophils Absolute: 3.7 10*3/uL (ref 1.4–7.0)
Neutrophils: 60 %
Platelets: 197 10*3/uL (ref 150–450)
RBC: 4.95 x10E6/uL (ref 3.77–5.28)
RDW: 12.7 % (ref 11.7–15.4)
WBC: 6.1 10*3/uL (ref 3.4–10.8)

## 2019-05-31 NOTE — Patient Instructions (Signed)
You can take 2 Tylenol 4 times per day and you can add 2 Aleve twice per day if you need to for relief

## 2019-05-31 NOTE — Progress Notes (Addendum)
   Subjective:    Patient ID: [name], female    DOB: 09/23/1945, 74 y.o.   MRN: [id number]  HPI She complains of a 5-day history of difficulty with swelling and pain in her hands.  She does not complain of any elbow, shoulder, knee or ankle pain.  No skin changes specifically rashes, fever, chills or other pains.   Review of Systems     Objective:   Physical Exam Alert and in no distress.  Exam of the hands does show tenderness and slight swelling in the wrist as well as PIP joints.  MCP joints normal.  No palpable pain or effusion of the elbows knees or ankles.       Assessment & Plan:  Inflammatory arthritis - Plan: Comprehensive metabolic panel, CBC with Differential/Platelet, Ambulatory referral to Rheumatology

## 2019-06-08 ENCOUNTER — Ambulatory Visit: Payer: PPO | Attending: Internal Medicine

## 2019-06-08 DIAGNOSIS — Z23 Encounter for immunization: Secondary | ICD-10-CM | POA: Insufficient documentation

## 2019-06-08 NOTE — Progress Notes (Signed)
   Covid-19 Vaccination [facility]  Name:  [name]    MRN: [id number] DOB: 1945/07/21  06/08/2019  [name] was observed post Covid-19 immunization for 15 minutes without incidence. She was provided with Vaccine Information Sheet and instruction to access the V-Safe system.   [name] was instructed to call 911 with any severe reactions post vaccine: Marland Kitchen Difficulty breathing  . Swelling of your face and throat  . A fast heartbeat  . A bad rash all over your body  . Dizziness and weakness    Immunizations Administered    Name Date Dose VIS Date Route   Pfizer COVID-19 Vaccine 06/08/2019  4:00 PM 0.3 mL 04/14/2019 Intramuscular   Manufacturer: [employer], [employer]   Lot: [id number]   NDC: [address]-2

## 2019-06-13 ENCOUNTER — Telehealth: Payer: Self-pay

## 2019-06-13 NOTE — Telephone Encounter (Signed)
Pt advised that she is in a lot of pain in right shoulder. Hands swollen . Pt is taking aleve and it helps little. Pt was last seen 05/31/19. Pt say pain is moving down the right side of body to the knee. Pt has to take husband to [facility] for surgery. Please advise if anything stronger can be sent. KH

## 2019-06-14 MED ORDER — MELOXICAM 15 MG PO TABS: 15.0000 mg | ORAL_TABLET | Freq: Every day | ORAL | 0 refills | Status: DC

## 2019-06-14 NOTE — Telephone Encounter (Signed)
Let her know that I called a pain medication and to be taken instead of the Aleve.  Make sure she has an appointment with rheumatology.

## 2019-06-14 NOTE — Telephone Encounter (Signed)
LVM advise pt of med called in. Checked and it appears that they will be calling her soon to schedule. KH

## 2019-06-15 ENCOUNTER — Ambulatory Visit (INDEPENDENT_AMBULATORY_CARE_PROVIDER_SITE_OTHER): Payer: PPO | Admitting: Family Medicine

## 2019-06-15 ENCOUNTER — Encounter: Payer: Self-pay | Admitting: Family Medicine

## 2019-06-15 ENCOUNTER — Other Ambulatory Visit: Payer: Self-pay

## 2019-06-15 VITALS — BP 110/70 | HR 92 | Temp 96.4°F | Wt 209.4 lb

## 2019-06-15 DIAGNOSIS — M1990 Unspecified osteoarthritis, unspecified site: Secondary | ICD-10-CM

## 2019-06-15 MED ORDER — TRAMADOL HCL 50 MG PO TABS: 100.0000 mg | ORAL_TABLET | Freq: Four times a day (QID) | ORAL | 0 refills | Status: DC | PRN

## 2019-06-15 NOTE — Patient Instructions (Signed)
  Lupus Anticoagulant Panel Test Why am I having this test? The lupus anticoagulant panel test can be used to help find the cause of abnormal blood clotting. Your health care provider may recommend this test if:  You are a woman and have had repeated miscarriages, preterm labor, or high blood pressure in pregnancy (preeclampsia).  You have had previous blood tests showing a longer-than-normal blood clotting time or a lower-than-normal number of platelets (thrombocytopenia).  Your health care provider suspects that you have a condition called antiphospholipid syndrome.  You have systemic lupus erythematosus (SLE, or lupus) or your health care provider suspects that you have this condition.  Your health care provider suspects that you have some type of rheumatic disease. What is being tested? This test checks your blood for certain autoantibodies (lupus anticoagulant autoantibodies) that can interfere with the normal blood clotting process. Autoantibodies are proteins that your body's defense system (immune system) makes to help fight invading germs, such as bacteria and viruses. Sometimes, the body mistakes normal tissues for abnormal ones, and it attacks those tissues as if they were invading germs. This is called an autoimmune response. The autoantibodies checked for in this test can mistakenly attack phospholipids, which are a normal part of many types of cells in the body. When blood clotting cells (platelets) are attacked by antiphospholipid antibodies, abnormal blood clotting can occur. When this happens, you are at an increased risk of developing repeated blood clots in your arteries and veins. You are also at an increased risk for heart attack or stroke. What kind of sample is taken?  A blood sample is required for this test. It is usually collected by inserting a needle into a blood vessel or by sticking a finger with a small needle. Tell a health care provider about:  All medicines  you are taking, including vitamins, herbs, eye drops, creams, and over-the-counter medicines.  Any medical conditions you have. How are the results reported? Your test results will be reported as values that indicate whether lupus anticoagulant autoantibodies were found in your blood. Your health care provider will compare your results to normal values that were established after testing a large group of people (reference values). Reference values may vary among labs and hospitals. For this test, common normal reference values are:  Less than 11 MPL (IgM phospholipid units).  Less than 23 GPL (IgG phospholipid units). What do the results mean? Results that are higher than the reference values may indicate the following health conditions:  Lupus.  Antiphospholipid syndrome. Talk with your health care provider about what your results mean. Questions to ask your health care provider Ask your health care provider, or the department that is doing the test:  When will my results be ready?  How will I get my results?  What are my treatment options?  What other tests do I need?  What are my next steps? Summary  The lupus anticoagulant panel test can be used to help find the cause of abnormal blood clotting.  This test checks your blood for certain proteins (autoantibodies) that can interfere with the normal blood clotting process.  The autoantibodies checked for in this test can mistakenly attack phospholipids, which are a normal part of many types of cells in the body. If the autoantibodies attack blood clotting cells (platelets), you may develop frequent blood clots.  Talk with your health care provider about what your test results mean. This information is not intended to replace advice given to you by your   health care provider. Make sure you discuss any questions you have with your health care provider. Document Revised: 04/02/2017 Document Reviewed: 01/21/2017 Elsevier Patient  Education  2020 Elsevier Inc.  

## 2019-06-15 NOTE — Progress Notes (Signed)
   Subjective:    Patient ID: [name], female    DOB: Oct 15, 1945, 74 y.o.   MRN: [id number]  HPI She is here for consult concerning continued difficulty with joint pain and swelling.  She has been using Aleve without much success.  I then gave her meloxicam again without much help.  She states that her hands, knees and elbows are getting worse.  She does have an appointment to see rheumatology on February 24.  She has also had a recent Covid vaccine approximately 10 days ago.   Review of Systems     Objective:   Physical Exam Alert and complaining of pain.  Exam of her wrist and fingers does show slight swelling as well as of her elbows and knees.  No erythema or warmth is noted.       Assessment & Plan:  Inflammatory arthritis - Plan: High sensitivity CRP, Sedimentation rate, Lupus anticoagulant panel, traMADol (ULTRAM) 50 MG tablet I will order some more routine screening and also give her Ultram.  Discussed the use of steroids but under the circumstances of having the Covid vaccine as well as seeing rheumatology in the near future, steroids at this time would not be appropriate.  She is aware of this.  Hopefully the tramadol will help her pain. Information concerning lupus given although not sure exactly that is the correct diagnosis.

## 2019-06-16 ENCOUNTER — Ambulatory Visit: Payer: PPO

## 2019-06-16 LAB — LUPUS ANTICOAGULANT PANEL
Dilute Viper Venom Time: 34 s (ref 0.0–47.0)
PTT Lupus Anticoagulant: 28.9 s (ref 0.0–51.9)

## 2019-06-16 LAB — HIGH SENSITIVITY CRP: CRP, High Sensitivity: 39.19 mg/L — ABNORMAL HIGH (ref 0.00–3.00)

## 2019-06-16 LAB — SEDIMENTATION RATE: Sed Rate: 107 mm/hr — ABNORMAL HIGH (ref 0–40)

## 2019-06-23 NOTE — Progress Notes (Signed)
Office Visit Note  Patient: [name]             Date of Birth: [birth year]           MRN: [id number]             PCP: Jennifer Lung, MD Referring: Jennifer Lung, MD Visit Date: 06/28/2019 Occupation: '@GUAROCC' @  Subjective:  Pain and swelling in multiple joints..   History of Present Illness: [name] is a 74 y.o. female seen in consultation per request of her PCP.  According to the patient about 6 weeks ago she started having pain and swelling in multiple joints.  She states she had sudden onset of swelling.  The pain has been in her bilateral shoulders, bilateral elbows, bilateral hands, bilateral knees, bilateral ankles and her feet.  Her right TMJ also has been painful.  She states her bilateral knee joints have been replaced they have been more painful and swollen.  She has been having nocturnal pain in her right shoulder joint.  Her hands have been very swollen in the morning and she is having with difficulty making a fist.  She also has difficulty walking.  She denies any back pain.  She states Dr. Redmond School did some lab work and her sedimentation rate was elevated.  He was hesitant to put her on prednisone as she will be getting COVID-19 vaccine.  She is also diabetic.  Activities of Daily Living:  Patient reports morning stiffness for 24 hours.   Patient Reports nocturnal pain.  Difficulty dressing/grooming: Reports Difficulty climbing stairs: Reports Difficulty getting out of chair: Reports Difficulty using hands for taps, buttons, cutlery, and/or writing: Reports  Review of Systems  Constitutional: Positive for fatigue. Negative for night sweats, weight gain and weight loss.  HENT: Negative for mouth sores, trouble swallowing, trouble swallowing, mouth dryness and nose dryness.   Eyes: Negative for pain, redness, itching, visual disturbance and dryness.  Respiratory: Negative for cough, shortness of breath and difficulty breathing.   Cardiovascular: Negative  for chest pain, palpitations, hypertension, irregular heartbeat and swelling in legs/feet.  Gastrointestinal: Negative for blood in stool, constipation and diarrhea.  Endocrine: Negative for increased urination.  Genitourinary: Negative for difficulty urinating, painful urination and vaginal dryness.  Musculoskeletal: Positive for arthralgias, joint pain, joint swelling and morning stiffness. Negative for myalgias, muscle weakness, muscle tenderness and myalgias.  Skin: Negative for color change, rash, hair loss, skin tightness, ulcers and sensitivity to sunlight.  Allergic/Immunologic: Negative for susceptible to infections.  Neurological: Positive for weakness. Negative for dizziness, numbness, headaches, memory loss and night sweats.  Hematological: Negative for bruising/bleeding tendency and swollen glands.  Psychiatric/Behavioral: Positive for sleep disturbance. Negative for depressed mood and confusion. The patient is not nervous/anxious.     PMFS History:  Patient Active Problem List   Diagnosis Date Noted  . Myalgia due to statin 04/11/2019  . 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 Hypermetropia of both eyes 04/22/2018  . Hyperopia with presbyopia 04/22/2018  . Osteopenia 02/14/2016  . 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 Statin intolerance 08/05/2015  . Obesity (BMI 30-39.9) 08/03/2014  . Diabetes mellitus type 2 with complications ([place]) 63/14/9702  . Hypertension associated with diabetes ([place]) 02/16/2012  . Occasional cigarette smoker 04/07/2010  . Hyperlipidemia associated with type 2 diabetes mellitus ([place]) 03/18/2010  . Sinusitis, chronic 03/18/2010    Past Medical History:  Diagnosis Date  . Allergy   .  COPD (chronic obstructive pulmonary disease) ([place])   . CVA (cerebral infarction)   . Diabetes mellitus without complication ([place])   . Insomnia   . Nasal polyps   . Obesity     Family History  Problem Relation  Age of Onset  . Arthritis Mother   . Diabetes Mother   . Aneurysm Father   . Arthritis Sister   . Down syndrome Son   . Diabetes Son    Past Surgical History:  Procedure Laterality Date  . ABDOMINAL HYSTERECTOMY    . CARDIAC CATHETERIZATION N/A 03/27/2015   Procedure: Left Heart Cath and Coronary Angiography;  Surgeon: Adrian Prows, MD;  Location: [facility];  Service: Cardiovascular;  Laterality: N/A;  . NASAL SINUS SURGERY    . REPLACEMENT TOTAL KNEE Bilateral    Social History   Social History Narrative  . Not on file   Immunization History  Administered Date(s) Administered  . Fluad Quad(high Dose 65+) 01/11/2019  . Influenza Whole 04/21/2010  . Influenza, High Dose Seasonal PF 02/20/2013, 02/07/2018  . Influenza,inj,Quad PF,6+ Mos 02/23/2017  . Influenza-Unspecified 02/15/2014, 01/30/2016, 02/07/2018, 01/11/2019  . PFIZER SARS-COV-2 Vaccination 06/08/2019  . Pneumococcal Conjugate-13 02/20/2013  . Pneumococcal Polysaccharide-23 04/05/2006  . Pneumococcal-Unspecified 03/05/2015  . Tdap 09/14/2001, 03/05/2015  . Tetanus 03/27/2014  . Zoster 12/07/2013  . Zoster Recombinat (Shingrix) 10/12/2017, 02/07/2018     Objective: Vital Signs: BP 123/74 (BP Location: Right Arm, Patient Position: Sitting, Cuff Size: Large)   Pulse 89   Resp 15   Ht '5\' 4"'  (1.626 m)   Wt 203 lb (92.1 kg)   BMI 34.84 kg/m    Physical Exam Vitals and nursing note reviewed.  Constitutional:      Appearance: She is well-developed.  HENT:     Head: Normocephalic and atraumatic.  Eyes:     Conjunctiva/sclera: Conjunctivae normal.  Cardiovascular:     Rate and Rhythm: Normal rate and regular rhythm.     Heart sounds: Normal heart sounds.  Pulmonary:     Effort: Pulmonary effort is normal.     Breath sounds: Normal breath sounds.  Abdominal:     General: Bowel sounds are normal.     Palpations: Abdomen is soft.  Musculoskeletal:     Cervical back: Normal range of motion.    Lymphadenopathy:     Cervical: No cervical adenopathy.  Skin:    General: Skin is warm and dry.     Capillary Refill: Capillary refill takes less than 2 seconds.     Findings: Rash present.     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  Neurological:     Mental Status: She is alert and oriented to person, place, and time.  Psychiatric:        Behavior: Behavior normal.      Musculoskeletal Exam: C-spine was in good range of motion.  She had no tenderness over SI joints.  She has painful range of motion of bilateral shoulder joints which was limited.  She had good range of motion of elbow joints with some discomfort.  She has tenderness over bilateral wrist joints.  She has synovitis of her bilateral MCPs and PIPs.  She had good range of motion of bilateral hip joints.  She has swelling and warmth in her bilateral knee joints and ankle joints.  She also has tenderness and swelling over her MTPs and PIPs of her feet.  CDAI Exam: CDAI Score: -- Patient Global: --; Provider Global: -- Swollen: --;  Tender: -- Joint Exam 06/28/2019   No joint exam has been documented for this visit   There is currently no information documented on the homunculus. Go to the Rheumatology activity and complete the homunculus joint exam.  Investigation: No additional findings.  Imaging: XR Foot 2 Views Left  Result Date: 06/28/2019 First MTP, all PIP and DIP narrowing was noted.  None of the other MTP joints, intertarsal tibiotalar joint space narrowing was noted.  Inferior calcaneal spur was noted.  Dorsal spurring was noted.  Generalized osteopenia was noted. Impression: These findings are consistent with osteoarthritis of the foot.  XR Foot 2 Views Right  Result Date: 06/28/2019 First MTP, all PIP and DIP narrowing was noted.  None of the other MTP joints, intertarsal tibiotalar joint space narrowing was noted.  Inferior calcaneal spur was noted.  Dorsal spurring was  noted.  Generalized osteopenia was noted. Impression: These findings are consistent with osteoarthritis of the foot.  XR Hand 2 View Left  Result Date: 06/28/2019 [facility] PIP and DIP narrowing was noted.  No MCP, intercarpal radiocarpal joint space narrowing was noted.  No erosive changes were noted. Impression: These findings are consistent with osteoarthritis of the hand.  XR Hand 2 View Right  Result Date: 06/28/2019 [facility] PIP and DIP narrowing was noted.  No MCP, intercarpal radiocarpal joint space narrowing was noted.  No erosive changes were noted. Impression: These findings are consistent with osteoarthritis of the hand.   Recent Labs: Lab Results  Component Value Date   WBC 6.1 05/31/2019   HGB 14.5 05/31/2019   PLT 197 05/31/2019   NA 136 05/31/2019   K 4.5 05/31/2019   CL 100 05/31/2019   CO2 19 (L) 05/31/2019   GLUCOSE 188 (H) 05/31/2019   BUN 16 05/31/2019   CREATININE 0.86 05/31/2019   BILITOT 0.5 05/31/2019   ALKPHOS 44 05/31/2019   AST 42 (H) 05/31/2019   ALT 30 05/31/2019   PROT 7.8 05/31/2019   ALBUMIN 4.4 05/31/2019   CALCIUM 9.2 05/31/2019   GFRAA 79 05/31/2019    Speciality Comments: No specialty comments available.  Procedures:  No procedures performed Allergies: Ibuprofen and Lipitor [atorvastatin calcium]   Assessment / Plan:     Visit Diagnoses: Inflammatory arthritis - 06/15/19: ESR 107, CRP 39.19, LA- -patient developed sudden onset pain and swelling in her multiple joints.  She has been having pain and swelling in almost all of her joints today.  She also has right TMJ discomfort.  I will obtain additional labs today.  Plan: Urinalysis, Routine w reflex microscopic, Rheumatoid factor, Cyclic citrul peptide antibody, IgG, 14-3-3 eta Protein, ANA, Pan-ANCA  Pain in both hands -she has pain and synovitis in her bilateral hands.  My plan is to start her on prednisone 10 mg p.o. daily.  I have advised her to monitor her blood sugar very closely.  I will  coordinate with Dr. Redmond School if she can be on low-dose insulin to manage her blood sugars.  We reached out to Dr. Lanice Shirts office.  They will contact patient to manage her blood sugar.  She will need more aggressive therapy after the lab results are available.  Plan: XR Hand 2 View Right, XR Hand 2 View Left.  X-ray showed mild osteoarthritic changes.  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  She is swelling in her bilateral knee joints.  Pain in both feet -she has pain and swelling in her bilateral feet.  Plan: XR Foot 2  Views Right, XR Foot 2 Views Left.  X-ray showed mild osteoarthritic changes and calcaneal spurs.  High risk medication use -I will obtain following labs today.  Plan: COMPLETE METABOLIC PANEL WITH GFR, Hepatitis B core antibody, IgM, Hepatitis B surface antigen, Hepatitis C antibody, HIV Antibody (routine testing w rflx), QuantiFERON-TB Gold Plus, Serum protein electrophoresis with reflex, IgG, IgA, IgM.  Patient had a chest x-ray on March 27, 2015.  Other fatigue -she has been experiencing increased fatigue.  Plan: CK, TSH  Osteopenia of multiple sites-according to patient she has been taking calcium and vitamin D.  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 XR Hand 2 View Right  . XR Hand 2 View Left  . XR Foot 2 Views Right  . XR Foot 2 Views Left  . Urinalysis, Routine w reflex microscopic  . CK  . TSH  . Rheumatoid factor  . Cyclic citrul peptide antibody, IgG   . 14-3-3 eta Protein  . ANA  . COMPLETE METABOLIC PANEL WITH GFR  . Hepatitis B core antibody, IgM  . Hepatitis B surface antigen  . Hepatitis C antibody  . HIV Antibody (routine testing w rflx)  . QuantiFERON-TB Gold Plus  . Serum protein electrophoresis with reflex  . IgG, IgA, IgM  . Pan-ANCA   Meds ordered this encounter  Medications  . predniSONE (DELTASONE) 5 MG tablet    Sig: Take 2 tablets (10 mg total) by mouth daily with breakfast.    Dispense:  60 tablet    Refill:  0    Face-to-face time spent with patient was 60 minutes. Greater than 50% of time was spent in counseling and coordination of care.  Follow-Up Instructions: Return in about 4 weeks (around 07/26/2019) for Inflammatory arthritis.   Bo Merino, MD  Note - This record has been created using Editor, commissioning.  Chart creation errors have been sought, but may not always  have been located. Such creation errors do not reflect on  the standard of medical care.

## 2019-06-26 ENCOUNTER — Other Ambulatory Visit: Payer: Self-pay

## 2019-06-26 ENCOUNTER — Encounter: Payer: Self-pay | Admitting: Family Medicine

## 2019-06-26 ENCOUNTER — Ambulatory Visit (INDEPENDENT_AMBULATORY_CARE_PROVIDER_SITE_OTHER): Payer: PPO | Admitting: Family Medicine

## 2019-06-26 VITALS — BP 120/72 | HR 97 | Temp 96.0°F | Wt 201.8 lb

## 2019-06-26 DIAGNOSIS — E785 Hyperlipidemia, unspecified: Secondary | ICD-10-CM

## 2019-06-26 DIAGNOSIS — H47013 Ischemic optic neuropathy, bilateral: Secondary | ICD-10-CM

## 2019-06-26 DIAGNOSIS — E1159 Type 2 diabetes mellitus with other circulatory complications: Secondary | ICD-10-CM | POA: Diagnosis not present

## 2019-06-26 DIAGNOSIS — Z789 Other specified health status: Secondary | ICD-10-CM | POA: Diagnosis not present

## 2019-06-26 DIAGNOSIS — E1169 Type 2 diabetes mellitus with other specified complication: Secondary | ICD-10-CM

## 2019-06-26 DIAGNOSIS — E118 Type 2 diabetes mellitus with unspecified complications: Secondary | ICD-10-CM

## 2019-06-26 DIAGNOSIS — M1990 Unspecified osteoarthritis, unspecified site: Secondary | ICD-10-CM | POA: Diagnosis not present

## 2019-06-26 DIAGNOSIS — Z720 Tobacco use: Secondary | ICD-10-CM

## 2019-06-26 DIAGNOSIS — E669 Obesity, unspecified: Secondary | ICD-10-CM | POA: Diagnosis not present

## 2019-06-26 DIAGNOSIS — E1136 Type 2 diabetes mellitus with diabetic cataract: Secondary | ICD-10-CM | POA: Diagnosis not present

## 2019-06-26 DIAGNOSIS — I152 Hypertension secondary to endocrine disorders: Secondary | ICD-10-CM

## 2019-06-26 DIAGNOSIS — I10 Essential (primary) hypertension: Secondary | ICD-10-CM | POA: Diagnosis not present

## 2019-06-26 LAB — POCT GLYCOSYLATED HEMOGLOBIN (HGB A1C): Hemoglobin A1C: 7.7 % — AB (ref 4.0–5.6)

## 2019-06-26 NOTE — Progress Notes (Signed)
  Subjective:    Patient ID: [name], female    DOB: [birth year], 74 y.o.   MRN: [id number]  [name] is a 74 y.o. female who presents for follow-up of Type 2 diabetes mellitus.  Home blood sugar records: meter records 90-200 avg Current symptoms/problems include none at this time. Daily foot checks:yes   Any foot concerns: none Exercise: walking as tolerated Diet:healthy She continues to smoke and has no interest in quitting.  She does have a history of statin intolerance.  She is on fenofibrate.  She is scheduled to see rheumatology on Wednesday.  She states that meloxicam is helping.  She tried 1 tramadol and did not like the CNS side effects from it.  She is scheduled to see ophthalmology for follow-up on cataract as well as ischemic neuropathy. The following portions of the patient's history were reviewed and updated as appropriate: allergies, current medications, past medical history, past social history and problem list.  ROS as in subjective above.     Objective:    Physical Exam Alert and in no distress otherwise not examined.  Blood pressure 120/72, pulse 97, temperature (!) 96 F (35.6 C), weight 201 lb 12.8 oz (91.5 kg), SpO2 94 %.  Lab Review Diabetic Labs Latest Ref Rng & Units 05/31/2019 02/20/2019 10/18/2018 06/20/2018 02/14/2018  HbA1c 4.0 - 5.6 % - 7.8(A) 7.2(A) 7.9(A) 6.8(A)  Microalbumin mg/L - - <5.0 - -  Micro/Creat Ratio - - - <5.6 - -  Chol 100 - 199 mg/dL - - 270(J) - -  HDL >50 mg/dL - - 09(F) - -  Calc LDL 0 - 99 mg/dL - - 818(E) - -  Triglycerides 0 - 149 mg/dL - - 993(Z) - -  Creatinine 0.57 - 1.00 mg/dL 1.69 - 6.78 - -   BP/Weight 06/26/2019 06/15/2019 05/31/2019 02/20/2019 10/18/2018  Systolic BP 120 110 110 112 124  Diastolic BP 72 70 68 70 86  Wt. (Lbs) 201.8 209.4 209.2 210.8 209.6  BMI 34.64 35.94 35.91 36.18 35.98   Foot/eye exam completion dates Latest Ref Rng & Units 10/18/2018 04/11/2018  Eye Exam No Retinopathy - No Retinopathy    Foot Form Completion - Done -  Hemoglobin A1c is 7.7  Deshawna  reports that she has been smoking. She has never used smokeless tobacco. She reports that she does not drink alcohol or use drugs.     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 Rx changes: none 2. Education: Reviewed 'ABCs' of diabetes management (respective goals in parentheses):  A1C (<7), blood pressure (<130/80), and cholesterol (LDL <100). 3. Compliance at present is estimated to be fair. Efforts to improve compliance (if necessary) will be directed at No change. 4. Follow up: 4 months At this point the energy and effort will be putting into her rheumatologic condition.  Otherwise she will maintain her present medication regimen.  Again smoking cessation was not discussed at her request as she is still not interested in quitting.

## 2019-06-26 NOTE — Addendum Note (Signed)
Addended by: Renelda Loma on: 06/26/2019 02:53 PM   Modules accepted: Orders

## 2019-06-28 ENCOUNTER — Other Ambulatory Visit: Payer: Self-pay

## 2019-06-28 ENCOUNTER — Ambulatory Visit (INDEPENDENT_AMBULATORY_CARE_PROVIDER_SITE_OTHER): Payer: PPO

## 2019-06-28 ENCOUNTER — Ambulatory Visit: Payer: PPO | Admitting: Rheumatology

## 2019-06-28 ENCOUNTER — Encounter: Payer: Self-pay | Admitting: Rheumatology

## 2019-06-28 ENCOUNTER — Ambulatory Visit: Payer: Self-pay

## 2019-06-28 ENCOUNTER — Telehealth: Payer: Self-pay

## 2019-06-28 VITALS — BP 123/74 | HR 89 | Resp 15 | Ht 64.0 in | Wt 203.0 lb

## 2019-06-28 DIAGNOSIS — Z96653 Presence of artificial knee joint, bilateral: Secondary | ICD-10-CM

## 2019-06-28 DIAGNOSIS — E119 Type 2 diabetes mellitus without complications: Secondary | ICD-10-CM | POA: Diagnosis not present

## 2019-06-28 DIAGNOSIS — E1169 Type 2 diabetes mellitus with other specified complication: Secondary | ICD-10-CM

## 2019-06-28 DIAGNOSIS — H25813 Combined forms of age-related cataract, bilateral: Secondary | ICD-10-CM | POA: Diagnosis not present

## 2019-06-28 DIAGNOSIS — M79641 Pain in right hand: Secondary | ICD-10-CM | POA: Diagnosis not present

## 2019-06-28 DIAGNOSIS — M79671 Pain in right foot: Secondary | ICD-10-CM | POA: Diagnosis not present

## 2019-06-28 DIAGNOSIS — M791 Myalgia, unspecified site: Secondary | ICD-10-CM | POA: Diagnosis not present

## 2019-06-28 DIAGNOSIS — Z720 Tobacco use: Secondary | ICD-10-CM | POA: Diagnosis not present

## 2019-06-28 DIAGNOSIS — R5383 Other fatigue: Secondary | ICD-10-CM | POA: Diagnosis not present

## 2019-06-28 DIAGNOSIS — I10 Essential (primary) hypertension: Secondary | ICD-10-CM

## 2019-06-28 DIAGNOSIS — E1159 Type 2 diabetes mellitus with other circulatory complications: Secondary | ICD-10-CM | POA: Diagnosis not present

## 2019-06-28 DIAGNOSIS — M79642 Pain in left hand: Secondary | ICD-10-CM | POA: Diagnosis not present

## 2019-06-28 DIAGNOSIS — Z789 Other specified health status: Secondary | ICD-10-CM

## 2019-06-28 DIAGNOSIS — E785 Hyperlipidemia, unspecified: Secondary | ICD-10-CM

## 2019-06-28 DIAGNOSIS — Z79899 Other long term (current) drug therapy: Secondary | ICD-10-CM

## 2019-06-28 DIAGNOSIS — M79672 Pain in left foot: Secondary | ICD-10-CM

## 2019-06-28 DIAGNOSIS — E1136 Type 2 diabetes mellitus with diabetic cataract: Secondary | ICD-10-CM

## 2019-06-28 DIAGNOSIS — H524 Presbyopia: Secondary | ICD-10-CM | POA: Diagnosis not present

## 2019-06-28 DIAGNOSIS — M8589 Other specified disorders of bone density and structure, multiple sites: Secondary | ICD-10-CM

## 2019-06-28 DIAGNOSIS — E118 Type 2 diabetes mellitus with unspecified complications: Secondary | ICD-10-CM

## 2019-06-28 DIAGNOSIS — M1990 Unspecified osteoarthritis, unspecified site: Secondary | ICD-10-CM | POA: Diagnosis not present

## 2019-06-28 DIAGNOSIS — H47013 Ischemic optic neuropathy, bilateral: Secondary | ICD-10-CM | POA: Diagnosis not present

## 2019-06-28 DIAGNOSIS — I152 Hypertension secondary to endocrine disorders: Secondary | ICD-10-CM

## 2019-06-28 DIAGNOSIS — T466X5A Adverse effect of antihyperlipidemic and antiarteriosclerotic drugs, initial encounter: Secondary | ICD-10-CM

## 2019-06-28 DIAGNOSIS — Z8709 Personal history of other diseases of the respiratory system: Secondary | ICD-10-CM

## 2019-06-28 DIAGNOSIS — H5203 Hypermetropia, bilateral: Secondary | ICD-10-CM | POA: Diagnosis not present

## 2019-06-28 DIAGNOSIS — H7291 Unspecified perforation of tympanic membrane, right ear: Secondary | ICD-10-CM

## 2019-06-28 LAB — HM DIABETES EYE EXAM

## 2019-06-28 MED ORDER — PREDNISONE 5 MG PO TABS: 10.0000 mg | ORAL_TABLET | Freq: Every day | ORAL | 0 refills | Status: DC

## 2019-06-28 NOTE — Telephone Encounter (Signed)
Call patient and let her know that the prednisone can make her blood sugars go up and she needs to monitor her blood sugars at least once per day and if they get above 200, she is to let us know.

## 2019-06-28 NOTE — Telephone Encounter (Signed)
Dr. Corliss Skains office call and wanted to coordinate care with pt newly added med. Pt was given prednisone 10 mg and pt has diabetes. Please advise of any changes. KH

## 2019-06-28 NOTE — Telephone Encounter (Signed)
Pt was advised KH 

## 2019-06-30 ENCOUNTER — Other Ambulatory Visit: Payer: Self-pay | Admitting: Family Medicine

## 2019-06-30 DIAGNOSIS — E1159 Type 2 diabetes mellitus with other circulatory complications: Secondary | ICD-10-CM

## 2019-06-30 DIAGNOSIS — I152 Hypertension secondary to endocrine disorders: Secondary | ICD-10-CM

## 2019-06-30 NOTE — Progress Notes (Signed)
Glucose level is elevated.  All other labs so far are unremarkable.  The plan is to start on a DMARD.  Please schedule an earlier appointment in the next couple of weeks if there is an opening.

## 2019-07-03 ENCOUNTER — Ambulatory Visit: Payer: PPO | Attending: Internal Medicine

## 2019-07-03 DIAGNOSIS — Z23 Encounter for immunization: Secondary | ICD-10-CM

## 2019-07-03 NOTE — Progress Notes (Signed)
   Covid-19 Vaccination Clinic  Name:  [name]    MRN: [id number] DOB: [birth year]  07/03/2019  [name] was observed post Covid-19 immunization for 15 minutes without incidence. She was provided with Vaccine Information Sheet and instruction to access the V-Safe system.   [name] was instructed to call 911 with any severe reactions post vaccine: Marland Kitchen Difficulty breathing  . Swelling of your face and throat  . A fast heartbeat  . A bad rash all over your body  . Dizziness and weakness    Immunizations Administered    Name Date Dose VIS Date Route   Pfizer COVID-19 Vaccine 07/03/2019  4:46 PM 0.3 mL 04/14/2019 Intramuscular   Manufacturer: [employer], [employer]   Lot: [id number]   NDC: [address]-8

## 2019-07-04 ENCOUNTER — Ambulatory Visit: Payer: Self-pay | Admitting: Rheumatology

## 2019-07-05 NOTE — Progress Notes (Signed)
Office Visit Note  Patient: [name]             Date of Birth: 08-Feb-1946           MRN: [id number]             PCP: Denita Lung, MD Referring: Denita Lung, MD Visit Date: 07/12/2019 Occupation: @GUAROCC @  Subjective:  Pain and swelling in multiple joints.   History of Present Illness: [name] is a 74 y.o. female with history of seronegative chronic inflammatory arthritis.  She continues to have pain and discomfort in multiple joints.  She states her right shoulder joint is very painful.  She continues to have pain in her bilateral shoulders, bilateral elbows, bilateral wrists and bilateral hands.  She also has constant discomfort in her bilateral knee joints and bilateral ankles.  She continues to have swelling in almost all of her joints.  She was given a low-dose prednisone because of the Covid vaccine injection.  She states the redness on was not as effective although it did help her symptoms.  Activities of Daily Living:  Patient reports morning stiffness for 5 hours.   Patient Reports nocturnal pain.  Difficulty dressing/grooming: Reports Difficulty climbing stairs: Reports Difficulty getting out of chair: Denies Difficulty using hands for taps, buttons, cutlery, and/or writing: Reports  Review of Systems  Constitutional: Positive for fatigue. Negative for night sweats, weight gain and weight loss.  HENT: Negative for mouth sores, trouble swallowing, trouble swallowing, mouth dryness and nose dryness.   Eyes: Negative for pain, redness, visual disturbance and dryness.  Respiratory: Negative for cough, shortness of breath and difficulty breathing.   Cardiovascular: Positive for swelling in legs/feet. Negative for chest pain, palpitations, hypertension and irregular heartbeat.  Gastrointestinal: Negative for blood in stool, constipation and diarrhea.  Endocrine: Negative for heat intolerance and increased urination.  Genitourinary: Negative for difficulty  urinating and vaginal dryness.  Musculoskeletal: Positive for arthralgias, joint pain, joint swelling, muscle weakness, morning stiffness and muscle tenderness. Negative for myalgias and myalgias.  Skin: Negative for color change, rash, hair loss, skin tightness, ulcers and sensitivity to sunlight.  Allergic/Immunologic: Negative for susceptible to infections.  Neurological: Positive for numbness and weakness. Negative for dizziness, memory loss and night sweats.  Hematological: Negative for bruising/bleeding tendency and swollen glands.  Psychiatric/Behavioral: Positive for sleep disturbance. Negative for depressed mood. The patient is not nervous/anxious.     PMFS History:  Patient Active Problem List   Diagnosis Date Noted  . Myalgia due to statin 04/11/2019  . 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 Hypermetropia of both eyes 04/22/2018  . Hyperopia with presbyopia 04/22/2018  . Osteopenia 02/14/2016  . Cataract associated with type 2 diabetes mellitus ([place]) 02/14/2016  . Statin intolerance 08/05/2015  . Obesity (BMI 30-39.9) 08/03/2014  . Diabetes mellitus type 2 with complications ([place]) 62/22/9798  . Hypertension associated with diabetes ([place]) 02/16/2012  . Occasional cigarette smoker 04/07/2010  . Hyperlipidemia associated with type 2 diabetes mellitus ([place]) 03/18/2010  . Sinusitis, chronic 03/18/2010    Past Medical History:  Diagnosis Date  . Allergy   . COPD (chronic obstructive pulmonary disease) ([place])   . CVA (cerebral infarction)   . Diabetes mellitus without complication ([place])   . Insomnia   . Nasal polyps   . Obesity     Family History  Problem Relation Age of Onset  . Arthritis Mother   . Diabetes Mother   . Aneurysm Father   .  Arthritis Sister   . Down syndrome Son   . Diabetes Son    Past Surgical History:  Procedure Laterality Date  . ABDOMINAL HYSTERECTOMY    . CARDIAC CATHETERIZATION N/A 03/27/2015   Procedure: Left Heart  Cath and Coronary Angiography;  Surgeon: Yates Decamp, MD;  Location: [facility] INVASIVE CV LAB;  Service: Cardiovascular;  Laterality: N/A;  . KNEE ARTHROPLASTY    . NASAL SINUS SURGERY    . REPLACEMENT TOTAL KNEE Bilateral    Social History   Social History Narrative  . Not on file   Immunization History  Administered Date(s) Administered  . Fluad Quad(high Dose 65+) 01/11/2019  . Influenza Whole 04/21/2010  . Influenza, High Dose Seasonal PF 02/20/2013, 02/07/2018  . Influenza,inj,Quad PF,6+ Mos 02/23/2017  . Influenza-Unspecified 02/15/2014, 01/30/2016, 02/07/2018, 01/11/2019  . PFIZER SARS-COV-2 Vaccination 06/08/2019, 07/03/2019  . Pneumococcal Conjugate-13 02/20/2013  . Pneumococcal Polysaccharide-23 04/05/2006  . Pneumococcal-Unspecified 03/05/2015  . Tdap 09/14/2001, 03/05/2015  . Tetanus 03/27/2014  . Zoster 12/07/2013  . Zoster Recombinat (Shingrix) 10/12/2017, 02/07/2018     Objective: Vital Signs: BP 125/70 (BP Location: Left Arm, Patient Position: Sitting, Cuff Size: Normal)   Pulse 95   Resp 16   Ht 5\' 4"  (1.626 m)   Wt 208 lb (94.3 kg)   BMI 35.70 kg/m    Physical Exam Vitals and nursing note reviewed.  Constitutional:      Appearance: She is well-developed.  HENT:     Head: Normocephalic and atraumatic.  Eyes:     Conjunctiva/sclera: Conjunctivae normal.  Cardiovascular:     Rate and Rhythm: Normal rate and regular rhythm.     Heart sounds: Normal heart sounds.  Pulmonary:     Effort: Pulmonary effort is normal.     Breath sounds: Normal breath sounds.  Abdominal:     General: Bowel sounds are normal.     Palpations: Abdomen is soft.  Musculoskeletal:     Cervical back: Normal range of motion.  Lymphadenopathy:     Cervical: No cervical adenopathy.  Skin:    General: Skin is warm and dry.     Capillary Refill: Capillary refill takes less than 2 seconds.  Neurological:     Mental Status: She is alert and oriented to person, place, and time.    Psychiatric:        Behavior: Behavior normal.      Musculoskeletal Exam: She had good range of motion of her cervical spine.  She had tenderness on palpation of her bilateral TMJs.  Shoulder joints and elbow joints are in good range of motion.  She has synovitis in several of her MCPs and PIPs as described below.  She has swelling of her bilateral knee joints and ankle joints.  She has tenderness over MTPs as described below.  CDAI Exam: CDAI Score: 27.3  Patient Global: 5 mm; Provider Global: 8 mm Swollen: 10 ; Tender: 25  Joint Exam 07/12/2019      Right  Left  Glenohumeral   Tender   Tender  Wrist  Swollen Tender  Swollen Tender  MCP 2  Swollen Tender  Swollen Tender  MCP 3  Swollen Tender  Swollen Tender  PIP 2   Tender   Tender  PIP 3   Tender   Tender  PIP 4   Tender   Tender  PIP 5   Tender   Tender  Knee  Swollen Tender  Swollen Tender  Ankle  Swollen Tender  Swollen Tender  MTP 2  Tender   Tender  MTP 3   Tender   Tender  MTP 4      Tender     Investigation: No additional findings.  Imaging: XR Foot 2 Views Left  Result Date: 06/28/2019 First MTP, all PIP and DIP narrowing was noted.  None of the other MTP joints, intertarsal tibiotalar joint space narrowing was noted.  Inferior calcaneal spur was noted.  Dorsal spurring was noted.  Generalized osteopenia was noted. Impression: These findings are consistent with osteoarthritis of the foot.  XR Foot 2 Views Right  Result Date: 06/28/2019 First MTP, all PIP and DIP narrowing was noted.  None of the other MTP joints, intertarsal tibiotalar joint space narrowing was noted.  Inferior calcaneal spur was noted.  Dorsal spurring was noted.  Generalized osteopenia was noted. Impression: These findings are consistent with osteoarthritis of the foot.  XR Hand 2 View Left  Result Date: 06/28/2019 [facility] PIP and DIP narrowing was noted.  No MCP, intercarpal radiocarpal joint space narrowing was noted.  No erosive changes were  noted. Impression: These findings are consistent with osteoarthritis of the hand.  XR Hand 2 View Right  Result Date: 06/28/2019 [facility] PIP and DIP narrowing was noted.  No MCP, intercarpal radiocarpal joint space narrowing was noted.  No erosive changes were noted. Impression: These findings are consistent with osteoarthritis of the hand.   Recent Labs: Lab Results  Component Value Date   WBC 6.1 05/31/2019   HGB 14.5 05/31/2019   PLT 197 05/31/2019   NA 135 06/28/2019   K 4.4 06/28/2019   CL 102 06/28/2019   CO2 25 06/28/2019   GLUCOSE 170 (H) 06/28/2019   BUN 19 06/28/2019   CREATININE 0.75 06/28/2019   BILITOT 0.5 06/28/2019   ALKPHOS 44 05/31/2019   AST 25 06/28/2019   ALT 16 06/28/2019   PROT 7.7 06/28/2019   PROT 7.9 06/28/2019   ALBUMIN 4.4 05/31/2019   CALCIUM 9.2 06/28/2019   GFRAA 93 06/28/2019   QFTBGOLDPLUS NEGATIVE 06/28/2019  June 28, 2019 UA negative, SPEP negative, IgG elevated, TB Gold negative, hepatitis B negative, hepatitis C negative, HIV negative, pan ANCA negative, CK 71, TSH normal, RF negative, anti-CCP negative, ANA negative, 14 3 3  eta negative  Speciality Comments: No specialty comments available.  Procedures:  No procedures performed Allergies: Ibuprofen and Lipitor [atorvastatin calcium]   Assessment / Plan:     Visit Diagnoses: Rheumatoid arthritis of multiple sites with negative rheumatoid factor (HCC)-I detailed discussion regarding her labs.  All her serology is negative.  She has severe inflammatory arthritis with active synovitis despite being on prednisone taper.  Different treatment options and their side effects were discussed at length.  We discussed the option of methotrexate versus Arava.  After discussing indications side effects contraindications she wanted to proceed with methotrexate.  And informed consent was obtained.  She will be started on methotrexate 0.6 mL subcu weekly for 2 weeks and then increase it to 0.8 mL subcu weekly  if the labs are stable.  She will get labs every 2 weeks x 2 and then every 2 months.  If labs are stable we will obtain them every 3 months.  She will be taking folic acid 2 mg p.o. daily.  High risk medication use - Patient was placed on low-dose prednisone taper as a bridging therapy.  Status post total bilateral knee replacement-she continues to have discomfort in her bilateral knee joints.  Primary osteoarthritis of both feet - Mild,  Other fatigue-she has been experiencing increased fatigue.  Osteopenia of multiple sites-he is on calcium and vitamin D.  Hypertension associated with diabetes (HCC)-blood pressure is well controlled.  Statin intolerance  Myalgia due to statin  Hyperlipidemia associated with type 2 diabetes mellitus (HCC)  Diabetes mellitus type 2 with complications (HCC)-patient has been monitoring her labs closely and may take insulin as needed for patient.  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 CBC with Differential/Platelet  . COMPLETE METABOLIC PANEL WITH GFR   Meds ordered this encounter  Medications  . methotrexate 50 MG/2ML injection    Sig: Inject 0.4 mL under the skin weekly for two weeks, then inject 0.6 mL under the skin weekly for two weeks, then inject 0.8 mL under the skin weekly.    Dispense:  6 mL    Refill:  0    Please dispense 2 mL vials with preservative  . TUBERCULIN SYR 1CC/27GX1/2" (B-D TB SYRINGE 1CC/27GX1/2") 27G X 1/2" 1 ML MISC    Sig: 12 Syringes by Does not apply route once a week.    Dispense:  12 each    Refill:  3  . folic acid (FOLVITE) 1 MG tablet    Sig: Take 2 tablets (2 mg total) by mouth daily.    Dispense:  180 tablet    Refill:  3    Face-to-face time spent with patient was 35 minutes. Greater than 50%  of time was spent in counseling and coordination of care.  Follow-Up Instructions: Return in about 4 weeks (around 08/09/2019) for Rheumatoid arthritis and MTX F/U.   Pollyann Savoy, MD  Note - This record has been created using Animal nutritionist.  Chart creation errors have been sought, but may not always  have been located. Such creation errors do not reflect on  the standard of medical care.

## 2019-07-06 LAB — URINALYSIS, ROUTINE W REFLEX MICROSCOPIC
Bilirubin Urine: NEGATIVE
Glucose, UA: NEGATIVE
Hgb urine dipstick: NEGATIVE
Ketones, ur: NEGATIVE
Leukocytes,Ua: NEGATIVE
Nitrite: NEGATIVE
Protein, ur: NEGATIVE
Specific Gravity, Urine: 1.02 (ref 1.001–1.03)
pH: 6 (ref 5.0–8.0)

## 2019-07-06 LAB — HEPATITIS B CORE ANTIBODY, IGM: Hep B C IgM: NONREACTIVE

## 2019-07-06 LAB — HEPATITIS C ANTIBODY
Hepatitis C Ab: NONREACTIVE
SIGNAL TO CUT-OFF: 0.07 (ref <1.00)

## 2019-07-06 LAB — PROTEIN ELECTROPHORESIS, SERUM, WITH REFLEX
Albumin ELP: 4 g/dL (ref 3.8–4.8)
Alpha 1: 0.4 g/dL — ABNORMAL HIGH (ref 0.2–0.3)
Alpha 2: 0.8 g/dL (ref 0.5–0.9)
Beta 2: 0.4 g/dL (ref 0.2–0.5)
Beta Globulin: 0.5 g/dL (ref 0.4–0.6)
Gamma Globulin: 1.8 g/dL — ABNORMAL HIGH (ref 0.8–1.7)
Total Protein: 7.9 g/dL (ref 6.1–8.1)

## 2019-07-06 LAB — COMPLETE METABOLIC PANEL WITH GFR
AG Ratio: 1.3 (calc) (ref 1.0–2.5)
ALT: 16 U/L (ref 6–29)
AST: 25 U/L (ref 10–35)
Albumin: 4.3 g/dL (ref 3.6–5.1)
Alkaline phosphatase (APISO): 37 U/L (ref 37–153)
BUN: 19 mg/dL (ref 7–25)
CO2: 25 mmol/L (ref 20–32)
Calcium: 9.2 mg/dL (ref 8.6–10.4)
Chloride: 102 mmol/L (ref 98–110)
Creat: 0.75 mg/dL (ref 0.60–0.93)
GFR, Est African American: 93 mL/min/{1.73_m2} (ref >=60)
GFR, Est Non African American: 80 mL/min/{1.73_m2} (ref >=60)
Globulin: 3.4 g/dL (calc) (ref 1.9–3.7)
Glucose, Bld: 170 mg/dL — ABNORMAL HIGH (ref 65–99)
Potassium: 4.4 mmol/L (ref 3.5–5.3)
Sodium: 135 mmol/L (ref 135–146)
Total Bilirubin: 0.5 mg/dL (ref 0.2–1.2)
Total Protein: 7.7 g/dL (ref 6.1–8.1)

## 2019-07-06 LAB — HIV ANTIBODY (ROUTINE TESTING W REFLEX): HIV 1&2 Ab, 4th Generation: NONREACTIVE

## 2019-07-06 LAB — PAN-ANCA
Myeloperoxidase Abs: <1 AI
Serine Protease 3: <1 AI

## 2019-07-06 LAB — IGG, IGA, IGM
IgG (Immunoglobin G), Serum: 1934 mg/dL — ABNORMAL HIGH (ref 600–1540)
IgM, Serum: 291 mg/dL (ref 50–300)
Immunoglobulin A: 189 mg/dL (ref 70–320)

## 2019-07-06 LAB — CK: Total CK: 71 U/L (ref 29–143)

## 2019-07-06 LAB — QUANTIFERON-TB GOLD PLUS
Mitogen-NIL: >10 IU/mL
NIL: 0.03 IU/mL
QuantiFERON-TB Gold Plus: NEGATIVE
TB1-NIL: 0.02 IU/mL
TB2-NIL: 0 IU/mL

## 2019-07-06 LAB — 14-3-3 ETA PROTEIN: 14-3-3 eta Protein: <0.2 ng/mL (ref <0.2)

## 2019-07-06 LAB — RHEUMATOID FACTOR: Rheumatoid fact SerPl-aCnc: <14 IU/mL (ref <14)

## 2019-07-06 LAB — TSH: TSH: 0.79 mIU/L (ref 0.40–4.50)

## 2019-07-06 LAB — ANA: Anti Nuclear Antibody (ANA): NEGATIVE

## 2019-07-06 LAB — CYCLIC CITRUL PEPTIDE ANTIBODY, IGG: Cyclic Citrullin Peptide Ab: <16 UNITS

## 2019-07-06 LAB — HEPATITIS B SURFACE ANTIGEN: Hepatitis B Surface Ag: NONREACTIVE

## 2019-07-07 ENCOUNTER — Other Ambulatory Visit: Payer: Self-pay | Admitting: Family Medicine

## 2019-07-07 NOTE — Telephone Encounter (Signed)
[facility] is requesting to fill pt meloxicam. Please advise KH 

## 2019-07-11 ENCOUNTER — Other Ambulatory Visit: Payer: Self-pay | Admitting: Family Medicine

## 2019-07-11 DIAGNOSIS — E118 Type 2 diabetes mellitus with unspecified complications: Secondary | ICD-10-CM

## 2019-07-12 ENCOUNTER — Ambulatory Visit: Payer: PPO | Admitting: Rheumatology

## 2019-07-12 ENCOUNTER — Encounter: Payer: Self-pay | Admitting: Rheumatology

## 2019-07-12 ENCOUNTER — Other Ambulatory Visit: Payer: Self-pay

## 2019-07-12 VITALS — BP 125/70 | HR 95 | Resp 16 | Ht 64.0 in | Wt 208.0 lb

## 2019-07-12 DIAGNOSIS — E785 Hyperlipidemia, unspecified: Secondary | ICD-10-CM

## 2019-07-12 DIAGNOSIS — E1169 Type 2 diabetes mellitus with other specified complication: Secondary | ICD-10-CM

## 2019-07-12 DIAGNOSIS — E1136 Type 2 diabetes mellitus with diabetic cataract: Secondary | ICD-10-CM | POA: Diagnosis not present

## 2019-07-12 DIAGNOSIS — M8589 Other specified disorders of bone density and structure, multiple sites: Secondary | ICD-10-CM | POA: Diagnosis not present

## 2019-07-12 DIAGNOSIS — I152 Hypertension secondary to endocrine disorders: Secondary | ICD-10-CM

## 2019-07-12 DIAGNOSIS — Z8709 Personal history of other diseases of the respiratory system: Secondary | ICD-10-CM

## 2019-07-12 DIAGNOSIS — R5383 Other fatigue: Secondary | ICD-10-CM | POA: Diagnosis not present

## 2019-07-12 DIAGNOSIS — E1159 Type 2 diabetes mellitus with other circulatory complications: Secondary | ICD-10-CM

## 2019-07-12 DIAGNOSIS — Z720 Tobacco use: Secondary | ICD-10-CM

## 2019-07-12 DIAGNOSIS — E118 Type 2 diabetes mellitus with unspecified complications: Secondary | ICD-10-CM | POA: Diagnosis not present

## 2019-07-12 DIAGNOSIS — Z96653 Presence of artificial knee joint, bilateral: Secondary | ICD-10-CM

## 2019-07-12 DIAGNOSIS — M791 Myalgia, unspecified site: Secondary | ICD-10-CM

## 2019-07-12 DIAGNOSIS — M19072 Primary osteoarthritis, left ankle and foot: Secondary | ICD-10-CM

## 2019-07-12 DIAGNOSIS — M19071 Primary osteoarthritis, right ankle and foot: Secondary | ICD-10-CM

## 2019-07-12 DIAGNOSIS — H7291 Unspecified perforation of tympanic membrane, right ear: Secondary | ICD-10-CM

## 2019-07-12 DIAGNOSIS — T466X5A Adverse effect of antihyperlipidemic and antiarteriosclerotic drugs, initial encounter: Secondary | ICD-10-CM

## 2019-07-12 DIAGNOSIS — Z79899 Other long term (current) drug therapy: Secondary | ICD-10-CM

## 2019-07-12 DIAGNOSIS — M0609 Rheumatoid arthritis without rheumatoid factor, multiple sites: Secondary | ICD-10-CM

## 2019-07-12 DIAGNOSIS — I10 Essential (primary) hypertension: Secondary | ICD-10-CM

## 2019-07-12 DIAGNOSIS — H47013 Ischemic optic neuropathy, bilateral: Secondary | ICD-10-CM

## 2019-07-12 DIAGNOSIS — Z789 Other specified health status: Secondary | ICD-10-CM

## 2019-07-12 MED ORDER — METHOTREXATE SODIUM CHEMO INJECTION 50 MG/2ML: INTRAMUSCULAR | 0 refills | Status: DC

## 2019-07-12 MED ORDER — "TUBERCULIN SYRINGE 27G X 1/2"" 1 ML MISC": 12.0000 | 3 refills | Status: DC

## 2019-07-12 MED ORDER — FOLIC ACID 1 MG PO TABS: 2.0000 mg | ORAL_TABLET | Freq: Every day | ORAL | 3 refills | Status: DC

## 2019-07-12 NOTE — Patient Instructions (Signed)
   START methotrexate 0.6 mls every 7 days for 2 weeks then increase to 0.8 mls every 7 days  START folic acid 2 mg daily  Standing Labs We placed an order today for your standing lab work.    Please come back and get your standing labs after starting methotrexte in 2 weeks, 4 weeks, 8 weeks, then every 3 months.  We have open lab daily Monday through Thursday from 8:30-12:30 PM and 1:30-4:30 PM and Friday from 8:30-12:30 PM and 1:30-4:00 PM at the office of Dr. Pollyann Savoy.   You may experience shorter wait times on Monday and Friday afternoons. The office is located at [address], [address], [place], [place] [address] No appointment is necessary.   Labs are drawn by [facility].  You may receive a bill from [place] for your lab work.  If you wish to have your labs drawn at another location, please call the office 24 hours in advance to send orders.  If you have any questions regarding directions or hours of operation,  please call [phone].   Just as a reminder please drink plenty of water prior to coming for your lab work. Thanks!

## 2019-07-12 NOTE — Progress Notes (Signed)
Pharmacy Note  Subjective: Patient presents today to [facility] for follow up office visit. Patient seen by the pharmacist for counseling on methotrexate for rheumatoid arthritis.  She is nave to immunosuppressant therapy.    Objective: CBC    Component Value Date/Time   WBC 6.1 05/31/2019 1015   WBC 6.9 10/20/2016 1101   RBC 4.95 05/31/2019 1015   RBC 5.23 (H) 10/20/2016 1101   HGB 14.5 05/31/2019 1015   HCT 43.5 05/31/2019 1015   PLT 197 05/31/2019 1015   MCV 88 05/31/2019 1015   MCH 29.3 05/31/2019 1015   MCH 29.6 10/20/2016 1101   MCHC 33.3 05/31/2019 1015   MCHC 33.8 10/20/2016 1101   RDW 12.7 05/31/2019 1015   LYMPHSABS 1.8 05/31/2019 1015   MONOABS 345 10/20/2016 1101   EOSABS 0.2 05/31/2019 1015   BASOSABS 0.0 05/31/2019 1015    CMP     Component Value Date/Time   NA 135 06/28/2019 0959   NA 136 05/31/2019 1015   K 4.4 06/28/2019 0959   CL 102 06/28/2019 0959   CO2 25 06/28/2019 0959   GLUCOSE 170 (H) 06/28/2019 0959   BUN 19 06/28/2019 0959   BUN 16 05/31/2019 1015   CREATININE 0.75 06/28/2019 0959   CALCIUM 9.2 06/28/2019 0959   PROT 7.7 06/28/2019 0959   PROT 7.9 06/28/2019 0959   PROT 7.8 05/31/2019 1015   ALBUMIN 4.4 05/31/2019 1015   AST 25 06/28/2019 0959   ALT 16 06/28/2019 0959   ALKPHOS 44 05/31/2019 1015   BILITOT 0.5 06/28/2019 0959   BILITOT 0.5 05/31/2019 1015   GFRNONAA 80 06/28/2019 0959   GFRAA 93 06/28/2019 0959    Baseline Immunosuppressant Therapy Labs TB GOLD Quantiferon TB Gold Latest Ref Rng & Units 06/28/2019  Quantiferon TB Gold Plus NEGATIVE NEGATIVE   Hepatitis Panel Hepatitis Latest Ref Rng & Units 06/28/2019  Hep B Surface Ag NON-REACTI NON-REACTIVE  Hep B IgM NON-REACTI NON-REACTIVE  Hep C Ab NEGATIVE -  Hep C Ab NON-REACTI NON-REACTIVE  Hep C Ab NON-REACTI NON-REACTIVE   HIV Lab Results  Component Value Date   HIV NON-REACTIVE 06/28/2019   Immunoglobulins Immunoglobulin Electrophoresis Latest  Ref Rng & Units 06/28/2019  IgA  70 - 320 mg/dL 189  IgG 600 - 1,540 mg/dL 1,934(H)  IgM 50 - 300 mg/dL 291   SPEP Serum Protein Electrophoresis Latest Ref Rng & Units 06/28/2019  Total Protein 6.1 - 8.1 g/dL 7.9  Albumin 3.8 - 4.8 g/dL 4.0  Alpha-1 0.2 - 0.3 g/dL 0.4(H)  Alpha-2 0.5 - 0.9 g/dL 0.8  Beta Globulin 0.4 - 0.6 g/dL 0.5  Beta 2 0.2 - 0.5 g/dL 0.4  Gamma Globulin 0.8 - 1.7 g/dL 1.8(H)   G6PD No results found for: G6PDH TPMT No results found for: TPMT   Chest-xray 03/27/2015 Shallow inspiration with atelectasis in the lung bases.  Contraception: post-menopausal  Alcohol use: N/a  Assessment/Plan:   Patient was counseled on the purpose, proper use, and adverse effects of methotrexate including nausea, infection, and signs and symptoms of pneumonitis. Discussed that there is the possibility of an increased risk of malignancy, specifically lymphomas, but it is not well understood if this increased risk is due to the medication or the disease state.  Instructed patient that medication should be held for infection and prior to surgery.  Advised patient to avoid live vaccines. Recommend annual influenza, Pneumovax 23, Prevnar 13, and Shingrix as indicated.   Reviewed instructions with patient to take methotrexate  weekly along with folic acid daily.  Discussed the importance of frequent monitoring of kidney and liver function and blood counts, and provided patient with standing lab instructions.  Counseled patient to avoid NSAIDs and alcohol while on methotrexate.  Provided patient with educational materials on methotrexate and answered all questions.   Patient voiced understanding.  Patient consented to methotrexate use.  Will upload into chart.    Dose of methotrexate will be 0.6 ml every 7 days for 2 weeks then increase to 0.8 ml every 7 days along with folic acid 2 mg daily.   Educated patient on how to use a vial and syringe and reviewed injection technique with patient.    Provided patient educational material regarding injection technique and storage of methotrexate. She has used vial and syringe insulin in the past and feels comfortable administering at home.  Patient is to return in 2 weeks for labs in 4 weeks for follow-up appointment and labs.  All questions encouraged and answered.  Instructed patient to call with any further questions or concerns.   Verlin Fester, PharmD, [place], CPP Clinical Specialty Pharmacist [phone]  07/12/2019 4:20 PM

## 2019-07-17 ENCOUNTER — Telehealth: Payer: Self-pay | Admitting: Rheumatology

## 2019-07-17 NOTE — Telephone Encounter (Signed)
Patient left a voicemail requesting to speak with you directly.   °

## 2019-07-18 NOTE — Telephone Encounter (Signed)
Returned patient call.  Patient had question about methotrexate dosing.  In office we had discussed starting at 0.6 mL for 2 weeks and then increasing to 0.8 mL.  The prescription was sent in to start 0.4 mL for 2 weeks, then 0.6 mL for 2 weeks and then increase to 0.8 mL.  Patient was confused and she took 0.4 mL last week and is due for her next injection tomorrow.  Reviewed chart and apologized for confusion.  Prescription was sent in prior to our discussion and decision to start 0.6 mL versus 0.4 mL as her kidney function is stable.  Advised patient to take 0.6 mL this week and next week then she can increase to 0.8 mL.  She is to return for labs later this week.  Patient verbalized understanding.  All questions encouraged and answered.  Instructed patient to reach out with further questions or concerns.  Verlin Fester, PharmD, [place], CPP Clinical Specialty Pharmacist [phone]  07/18/2019 9:51 AM

## 2019-07-20 ENCOUNTER — Other Ambulatory Visit: Payer: Self-pay

## 2019-07-20 DIAGNOSIS — Z79899 Other long term (current) drug therapy: Secondary | ICD-10-CM

## 2019-07-21 ENCOUNTER — Other Ambulatory Visit: Payer: Self-pay | Admitting: Rheumatology

## 2019-07-21 LAB — CBC WITH DIFFERENTIAL/PLATELET
Absolute Monocytes: 451 cells/uL (ref 200–950)
Basophils Absolute: 39 cells/uL (ref 0–200)
Basophils Relative: 0.4 %
Eosinophils Absolute: 69 cells/uL (ref 15–500)
Eosinophils Relative: 0.7 %
HCT: 41.3 % (ref 35.0–45.0)
Hemoglobin: 13.8 g/dL (ref 11.7–15.5)
Lymphs Abs: 1568 cells/uL (ref 850–3900)
MCH: 29.2 pg (ref 27.0–33.0)
MCHC: 33.4 g/dL (ref 32.0–36.0)
MCV: 87.5 fL (ref 80.0–100.0)
MPV: 9.9 fL (ref 7.5–12.5)
Monocytes Relative: 4.6 %
Neutro Abs: 7673 cells/uL (ref 1500–7800)
Neutrophils Relative %: 78.3 %
Platelets: 276 10*3/uL (ref 140–400)
RBC: 4.72 10*6/uL (ref 3.80–5.10)
RDW: 12.7 % (ref 11.0–15.0)
Total Lymphocyte: 16 %
WBC: 9.8 10*3/uL (ref 3.8–10.8)

## 2019-07-21 LAB — TEST AUTHORIZATION

## 2019-07-21 LAB — COMPLETE METABOLIC PANEL WITH GFR
AG Ratio: 1.1 (calc) (ref 1.0–2.5)
ALT: 14 U/L (ref 6–29)
AST: 19 U/L (ref 10–35)
Albumin: 3.9 g/dL (ref 3.6–5.1)
Alkaline phosphatase (APISO): 33 U/L — ABNORMAL LOW (ref 37–153)
BUN: 18 mg/dL (ref 7–25)
CO2: 27 mmol/L (ref 20–32)
Calcium: 9.5 mg/dL (ref 8.6–10.4)
Chloride: 101 mmol/L (ref 98–110)
Creat: 0.81 mg/dL (ref 0.60–0.93)
GFR, Est African American: 85 mL/min/{1.73_m2} (ref >=60)
GFR, Est Non African American: 73 mL/min/{1.73_m2} (ref >=60)
Globulin: 3.5 g/dL (calc) (ref 1.9–3.7)
Glucose, Bld: 183 mg/dL — ABNORMAL HIGH (ref 65–99)
Potassium: 4.7 mmol/L (ref 3.5–5.3)
Sodium: 136 mmol/L (ref 135–146)
Total Bilirubin: 0.4 mg/dL (ref 0.2–1.2)
Total Protein: 7.4 g/dL (ref 6.1–8.1)

## 2019-07-21 LAB — PHOSPHORUS: Phosphorus: 4.3 mg/dL (ref 2.1–4.3)

## 2019-07-21 NOTE — Telephone Encounter (Signed)
Patient advised okay to taper prednisone per Dr. Corliss Skains and prescription sent to the pharmacy.

## 2019-07-21 NOTE — Telephone Encounter (Signed)
Okay to taper prednisone.  Please call in prednisone 5 mg tablets.  She can decrease prednisone to 7.5 mg p.o. daily for 1 week and then taper by 2.5 mg every week.

## 2019-07-21 NOTE — Telephone Encounter (Addendum)
Last visit: 07/12/19 Next Visit: 08/15/19  Unable to find dose in last office note or if patient is to continue. Please advise.   Spoke with she is currently on Prednisone 10 mg. She states that at her last appointment you discussed tapering the Prednisone.Patient states she has 6 days of 10 mg of Prednisone left.    Please advise.

## 2019-07-27 ENCOUNTER — Ambulatory Visit: Payer: PPO | Admitting: Rheumatology

## 2019-08-10 ENCOUNTER — Other Ambulatory Visit: Payer: Self-pay

## 2019-08-10 DIAGNOSIS — Z79899 Other long term (current) drug therapy: Secondary | ICD-10-CM | POA: Diagnosis not present

## 2019-08-10 LAB — CBC WITH DIFFERENTIAL/PLATELET
Absolute Monocytes: 366 cells/uL (ref 200–950)
Basophils Absolute: 42 cells/uL (ref 0–200)
Basophils Relative: 0.8 %
Eosinophils Absolute: 159 cells/uL (ref 15–500)
Eosinophils Relative: 3 %
HCT: 41.7 % (ref 35.0–45.0)
Hemoglobin: 13.5 g/dL (ref 11.7–15.5)
Lymphs Abs: 2131 cells/uL (ref 850–3900)
MCH: 29.1 pg (ref 27.0–33.0)
MCHC: 32.4 g/dL (ref 32.0–36.0)
MCV: 89.9 fL (ref 80.0–100.0)
MPV: 9.4 fL (ref 7.5–12.5)
Monocytes Relative: 6.9 %
Neutro Abs: 2602 cells/uL (ref 1500–7800)
Neutrophils Relative %: 49.1 %
Platelets: 281 10*3/uL (ref 140–400)
RBC: 4.64 10*6/uL (ref 3.80–5.10)
RDW: 13.5 % (ref 11.0–15.0)
Total Lymphocyte: 40.2 %
WBC: 5.3 10*3/uL (ref 3.8–10.8)

## 2019-08-10 LAB — COMPLETE METABOLIC PANEL WITH GFR
AG Ratio: 1.3 (calc) (ref 1.0–2.5)
ALT: 15 U/L (ref 6–29)
AST: 21 U/L (ref 10–35)
Albumin: 3.9 g/dL (ref 3.6–5.1)
Alkaline phosphatase (APISO): 30 U/L — ABNORMAL LOW (ref 37–153)
BUN: 16 mg/dL (ref 7–25)
CO2: 26 mmol/L (ref 20–32)
Calcium: 9.1 mg/dL (ref 8.6–10.4)
Chloride: 104 mmol/L (ref 98–110)
Creat: 0.78 mg/dL (ref 0.60–0.93)
GFR, Est African American: 89 mL/min/{1.73_m2} (ref >=60)
GFR, Est Non African American: 76 mL/min/{1.73_m2} (ref >=60)
Globulin: 3.1 g/dL (calc) (ref 1.9–3.7)
Glucose, Bld: 148 mg/dL — ABNORMAL HIGH (ref 65–99)
Potassium: 4.2 mmol/L (ref 3.5–5.3)
Sodium: 138 mmol/L (ref 135–146)
Total Bilirubin: 0.5 mg/dL (ref 0.2–1.2)
Total Protein: 7 g/dL (ref 6.1–8.1)

## 2019-08-11 NOTE — Progress Notes (Signed)
Office Visit Note  Patient: [name]             Date of Birth: August 19, 1945           MRN: [id number]             PCP: Denita Lung, MD Referring: Denita Lung, MD Visit Date: 08/15/2019 Occupation: _0 @  Subjective:  Medication monitoring   History of Present Illness: [name] is a 74 y.o. female with history of seronegative rheumatoid arthritis and osteoarthritis.  She was started on methotrexate after her last office visit on 07/12/2019.  She is on MTX 0.8 ml sq once weekly and folic acid 2 mg po daily. She has been tolerating MTX.  She states she has noticed some increased hair loss since starting on MTX. She states she has noticed about 80% improvement since starting on MTX and taking prednisone taper.  She is currently on prednisone 2.5 mg po daily and will be completing the taper this week. She continues to have persistent pain in both wrist joints. She has difficulty opening bottles and jars.  She has persistent left ankle joint pain and swelling.  She states her right shoulder pain has improvement.    Activities of Daily Living:  Patient reports morning stiffness for 1 hour.   Patient Denies nocturnal pain.  Difficulty dressing/grooming: Denies Difficulty climbing stairs: Denies Difficulty getting out of chair: Denies Difficulty using hands for taps, buttons, cutlery, and/or writing: Reports  Review of Systems  Constitutional: Negative for fatigue.  HENT: Negative for mouth sores, mouth dryness and nose dryness.   Eyes: Negative for pain, itching, visual disturbance and dryness.  Respiratory: Negative for cough, hemoptysis, shortness of breath and difficulty breathing.   Cardiovascular: Negative for chest pain, palpitations, hypertension and swelling in legs/feet.  Gastrointestinal: Negative for blood in stool, constipation and diarrhea.  Endocrine: Negative for increased urination.  Genitourinary: Negative for difficulty urinating and painful  urination.  Musculoskeletal: Positive for arthralgias, joint pain, joint swelling and morning stiffness. Negative for myalgias, muscle weakness, muscle tenderness and myalgias.  Skin: Positive for hair loss. Negative for color change, pallor, rash, nodules/bumps, redness, skin tightness, ulcers and sensitivity to sunlight.  Allergic/Immunologic: Negative for susceptible to infections.  Neurological: Negative for dizziness, numbness, headaches, memory loss and weakness.  Hematological: Negative for bruising/bleeding tendency and swollen glands.  Psychiatric/Behavioral: Negative for depressed mood, confusion and sleep disturbance. The patient is not nervous/anxious.     PMFS History:  Patient Active Problem List   Diagnosis Date Noted  . Myalgia due to statin 04/11/2019  . 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 Hypermetropia of both eyes 04/22/2018  . Hyperopia with presbyopia 04/22/2018  . Osteopenia 02/14/2016  . Cataract associated with type 2 diabetes mellitus ([place]) 02/14/2016  . Statin intolerance 08/05/2015  . Obesity (BMI 30-39.9) 08/03/2014  . Diabetes mellitus type 2 with complications ([place]) 36/14/4315  . Hypertension associated with diabetes ([place]) 02/16/2012  . Occasional cigarette smoker 04/07/2010  . Hyperlipidemia associated with type 2 diabetes mellitus ([place]) 03/18/2010  . Sinusitis, chronic 03/18/2010    Past Medical History:  Diagnosis Date  . Allergy   . COPD (chronic obstructive pulmonary disease) ([place])   . CVA (cerebral infarction)   . Diabetes mellitus without complication ([place])   . Insomnia   . Nasal polyps   . Obesity     Family History  Problem Relation Age of Onset  . Arthritis Mother   .  Diabetes Mother   . Aneurysm Father   . Arthritis Sister   . Down syndrome Son   . Diabetes Son    Past Surgical History:  Procedure Laterality Date  . ABDOMINAL HYSTERECTOMY    . CARDIAC CATHETERIZATION N/A 03/27/2015   Procedure: Left  Heart Cath and Coronary Angiography;  Surgeon: Adrian Prows, MD;  Location: [facility];  Service: Cardiovascular;  Laterality: N/A;  . KNEE ARTHROPLASTY    . NASAL SINUS SURGERY    . REPLACEMENT TOTAL KNEE Bilateral    Social History   Social History Narrative  . Not on file   Immunization History  Administered Date(s) Administered  . Fluad Quad(high Dose 65+) 01/11/2019  . Influenza Whole 04/21/2010  . Influenza, High Dose Seasonal PF 02/20/2013, 02/07/2018  . Influenza,inj,Quad PF,6+ Mos 02/23/2017  . Influenza-Unspecified 02/15/2014, 01/30/2016, 02/07/2018, 01/11/2019  . PFIZER SARS-COV-2 Vaccination 06/08/2019, 07/03/2019  . Pneumococcal Conjugate-13 02/20/2013  . Pneumococcal Polysaccharide-23 04/05/2006  . Pneumococcal-Unspecified 03/05/2015  . Tdap 09/14/2001, 03/05/2015  . Tetanus 03/27/2014  . Zoster 12/07/2013  . Zoster Recombinat (Shingrix) 10/12/2017, 02/07/2018     Objective: Vital Signs: BP 113/76 (BP Location: Left Arm, Patient Position: Sitting, Cuff Size: Normal)   Pulse 85   Resp 16   Ht 5' 4" (1.626 m)   Wt 208 lb 3.2 oz (94.4 kg)   BMI 35.74 kg/m    Physical Exam Vitals and nursing note reviewed.  Constitutional:      Appearance: She is well-developed.  HENT:     Head: Normocephalic and atraumatic.  Eyes:     Conjunctiva/sclera: Conjunctivae normal.  Pulmonary:     Effort: Pulmonary effort is normal.  Abdominal:     General: Bowel sounds are normal.     Palpations: Abdomen is soft.  Musculoskeletal:     Cervical back: Normal range of motion.  Lymphadenopathy:     Cervical: No cervical adenopathy.  Skin:    General: Skin is warm and dry.     Capillary Refill: Capillary refill takes less than 2 seconds.  Neurological:     Mental Status: She is alert and oriented to person, place, and time.  Psychiatric:        Behavior: Behavior normal.      Musculoskeletal Exam: C-spine, thoracic spine, lumbar spine good range of motion.  Shoulder  joints have good range of motion with some discomfort in the right shoulder.  Elbow joints and wrist joints have good range of motion.  She has tenderness of the right wrist but no synovitis was noted on exam.  MCPs, PIPs, DIPs good range of motion with no synovitis.  She has tenderness of the left second and third MCP joints.  Hip joints have good range of motion with no discomfort.  Knee joints have good range of motion with no warmth or effusion.  Ankle joints have good range of motion with no tenderness or inflammation.  CDAI Exam: CDAI Score: -- Patient Global: --; Provider Global: -- Swollen: 1 ; Tender: 4  Joint Exam 08/15/2019      Right  Left  Wrist   Tender     MCP 2      Tender  MCP 3      Tender  Ankle     Swollen Tender     Investigation: No additional findings.  Imaging: No results found.  Recent Labs: Lab Results  Component Value Date   WBC 5.3 08/10/2019   HGB 13.5 08/10/2019   PLT 281  08/10/2019   NA 138 08/10/2019   K 4.2 08/10/2019   CL 104 08/10/2019   CO2 26 08/10/2019   GLUCOSE 148 (H) 08/10/2019   BUN 16 08/10/2019   CREATININE 0.78 08/10/2019   BILITOT 0.5 08/10/2019   ALKPHOS 44 05/31/2019   AST 21 08/10/2019   ALT 15 08/10/2019   PROT 7.0 08/10/2019   ALBUMIN 4.4 05/31/2019   CALCIUM 9.1 08/10/2019   GFRAA 89 08/10/2019   QFTBGOLDPLUS NEGATIVE 06/28/2019    Speciality Comments: No specialty comments available.  Procedures:  No procedures performed Allergies: Ibuprofen and Lipitor [atorvastatin calcium]   Assessment / Plan:     Visit Diagnoses: Rheumatoid arthritis of multiple sites with negative rheumatoid factor ([place]) - All her serology is negative.  Severe inflammatory arthritis: She has tenderness and synovitis of the left ankle joint on exam.  She has tenderness of the right wrist joint but no synovitis.  She was started on methotrexate and a prednisone taper at the beginning of March 2021.  She is currently on methotrexate 0.8 ml sq  once weekly injections and folic acid 2 mg by mouth daily.  She has been tolerating methotrexate without any side effects.  She has noticed some mild hair thinning and is unsure if it is related to methotrexate.  She was encouraged to try taking Biotin and stay compliant taking folic acid 2 mg daily.  She has noticed about 80% improvement in her joint pain and inflammation since starting on methotrexate and the prednisone taper.  She is currently on prednisone 2.5 mg and will be completing the taper this week.  We discussed continuing on methotrexate 0.8 mL once weekly.  She may require more aggressive treatment or combination therapy in the future.  She will follow-up in the office in 2 months.  High risk medication use - She started on MTX in March 2021.  She is currently on MTX 0.64m sq once weekly and folic acid 2 mg po daily. CBC and CMP were drawn on 08/10/19.  She will return for lab work in 1 month then every 3 months to monitor for drug toxicity.  She was advised to hold MTX if she develops any signs or symptoms of an infection and to resume once the infection has completely cleared.   Status post total bilateral knee replacement: Doing well.  She has good ROM of both knee replacements.  Primary osteoarthritis of both feet: She has chronic pain and inflammation of the left ankle joint.  She experiences discomfort on the dorsal aspect of both feet intermittently.  We discussed the importance of wearing proper fitting shoes.   Other fatigue: Chronic but stable.   Osteopenia of multiple sites: History of osteopenia. DEXA on 12/14/17: The BMD measured at Femur Neck Right is 0.971 g/cm2 with a T-score of -0.5. Repeat DEXA in 5 years.   Low serum alkaline phosphatase - Alk phos was low at 33 and has trended down to 30 on 08/11/19. Phosphorus WNL on 07/20/19.  We discussed that we would continue to monitor. She requested a total body bone scan, but we discussed that it is not medically necessary at this  time.  We will first recheck the alk phos in 1 month.  We can add a vitamin B6 in the future if alk phos remains low.   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 Alkaline Phosphatase Isoenzymes   No orders of the defined types were placed in this encounter.   Face-to-face time spent with patient was 30 minutes. Greater than 50% of time was spent in counseling and coordination of care.  Follow-Up Instructions: Return in about 2 months (around 10/15/2019) for Rheumatoid arthritis, Osteoarthritis.   Ofilia Neas, PA-C  Note - This record has been created using Dragon software.  Chart creation errors have been sought, but may not always  have been located. Such creation errors do not reflect on  the standard of medical care.

## 2019-08-15 ENCOUNTER — Other Ambulatory Visit: Payer: Self-pay

## 2019-08-15 ENCOUNTER — Encounter: Payer: Self-pay | Admitting: Physician Assistant

## 2019-08-15 ENCOUNTER — Ambulatory Visit: Payer: PPO | Admitting: Physician Assistant

## 2019-08-15 VITALS — BP 113/76 | HR 85 | Resp 16 | Ht 64.0 in | Wt 208.2 lb

## 2019-08-15 DIAGNOSIS — M19072 Primary osteoarthritis, left ankle and foot: Secondary | ICD-10-CM

## 2019-08-15 DIAGNOSIS — E118 Type 2 diabetes mellitus with unspecified complications: Secondary | ICD-10-CM

## 2019-08-15 DIAGNOSIS — M19071 Primary osteoarthritis, right ankle and foot: Secondary | ICD-10-CM

## 2019-08-15 DIAGNOSIS — E1159 Type 2 diabetes mellitus with other circulatory complications: Secondary | ICD-10-CM

## 2019-08-15 DIAGNOSIS — E1136 Type 2 diabetes mellitus with diabetic cataract: Secondary | ICD-10-CM | POA: Diagnosis not present

## 2019-08-15 DIAGNOSIS — R5383 Other fatigue: Secondary | ICD-10-CM

## 2019-08-15 DIAGNOSIS — M791 Myalgia, unspecified site: Secondary | ICD-10-CM | POA: Diagnosis not present

## 2019-08-15 DIAGNOSIS — Z8709 Personal history of other diseases of the respiratory system: Secondary | ICD-10-CM

## 2019-08-15 DIAGNOSIS — H47013 Ischemic optic neuropathy, bilateral: Secondary | ICD-10-CM

## 2019-08-15 DIAGNOSIS — E1169 Type 2 diabetes mellitus with other specified complication: Secondary | ICD-10-CM | POA: Diagnosis not present

## 2019-08-15 DIAGNOSIS — Z789 Other specified health status: Secondary | ICD-10-CM | POA: Diagnosis not present

## 2019-08-15 DIAGNOSIS — Z79899 Other long term (current) drug therapy: Secondary | ICD-10-CM | POA: Diagnosis not present

## 2019-08-15 DIAGNOSIS — M0609 Rheumatoid arthritis without rheumatoid factor, multiple sites: Secondary | ICD-10-CM

## 2019-08-15 DIAGNOSIS — R748 Abnormal levels of other serum enzymes: Secondary | ICD-10-CM

## 2019-08-15 DIAGNOSIS — Z720 Tobacco use: Secondary | ICD-10-CM

## 2019-08-15 DIAGNOSIS — T466X5A Adverse effect of antihyperlipidemic and antiarteriosclerotic drugs, initial encounter: Secondary | ICD-10-CM

## 2019-08-15 DIAGNOSIS — I152 Hypertension secondary to endocrine disorders: Secondary | ICD-10-CM

## 2019-08-15 DIAGNOSIS — M8589 Other specified disorders of bone density and structure, multiple sites: Secondary | ICD-10-CM

## 2019-08-15 DIAGNOSIS — Z96653 Presence of artificial knee joint, bilateral: Secondary | ICD-10-CM | POA: Diagnosis not present

## 2019-08-15 DIAGNOSIS — I10 Essential (primary) hypertension: Secondary | ICD-10-CM

## 2019-08-15 DIAGNOSIS — E785 Hyperlipidemia, unspecified: Secondary | ICD-10-CM

## 2019-08-15 NOTE — Patient Instructions (Signed)
Standing Labs We placed an order today for your standing lab work.    Please come back and get your standing labs in April  We have open lab daily Monday through Thursday from 8:30-12:30 PM and 1:30-4:30 PM and Friday from 8:30-12:30 PM and 1:30-4:00 PM at the office of Dr. Shaili Deveshwar.   You may experience shorter wait times on Monday and Friday afternoons. The office is located at [address], [address], [place], [place] [address] No appointment is necessary.   Labs are drawn by [facility].  You may receive a bill from [facility] for your lab work.  If you wish to have your labs drawn at another location, please call the office 24 hours in advance to send orders.  If you have any questions regarding directions or hours of operation,  please call [phone].   Just as a reminder please drink plenty of water prior to coming for your lab work. Thanks!  

## 2019-08-24 LAB — HM DIABETES EYE EXAM

## 2019-09-11 ENCOUNTER — Other Ambulatory Visit: Payer: Self-pay

## 2019-09-11 DIAGNOSIS — Z79899 Other long term (current) drug therapy: Secondary | ICD-10-CM | POA: Diagnosis not present

## 2019-09-11 LAB — COMPLETE METABOLIC PANEL WITH GFR
AG Ratio: 1.3 (calc) (ref 1.0–2.5)
ALT: 25 U/L (ref 6–29)
AST: 40 U/L — ABNORMAL HIGH (ref 10–35)
Albumin: 3.9 g/dL (ref 3.6–5.1)
Alkaline phosphatase (APISO): 39 U/L (ref 37–153)
BUN/Creatinine Ratio: 17 (calc) (ref 6–22)
BUN: 16 mg/dL (ref 7–25)
CO2: 26 mmol/L (ref 20–32)
Calcium: 9 mg/dL (ref 8.6–10.4)
Chloride: 104 mmol/L (ref 98–110)
Creat: 0.96 mg/dL — ABNORMAL HIGH (ref 0.60–0.93)
GFR, Est African American: 69 mL/min/{1.73_m2} (ref >=60)
GFR, Est Non African American: 60 mL/min/{1.73_m2} (ref >=60)
Globulin: 3.1 g/dL (calc) (ref 1.9–3.7)
Glucose, Bld: 137 mg/dL — ABNORMAL HIGH (ref 65–99)
Potassium: 4.3 mmol/L (ref 3.5–5.3)
Sodium: 139 mmol/L (ref 135–146)
Total Bilirubin: 0.4 mg/dL (ref 0.2–1.2)
Total Protein: 7 g/dL (ref 6.1–8.1)

## 2019-09-11 LAB — CBC WITH DIFFERENTIAL/PLATELET
Absolute Monocytes: 338 cells/uL (ref 200–950)
Basophils Absolute: 31 cells/uL (ref 0–200)
Basophils Relative: 0.6 %
Eosinophils Absolute: 161 cells/uL (ref 15–500)
Eosinophils Relative: 3.1 %
HCT: 39.4 % (ref 35.0–45.0)
Hemoglobin: 13.3 g/dL (ref 11.7–15.5)
Lymphs Abs: 1950 cells/uL (ref 850–3900)
MCH: 29.5 pg (ref 27.0–33.0)
MCHC: 33.8 g/dL (ref 32.0–36.0)
MCV: 87.4 fL (ref 80.0–100.0)
MPV: 10.6 fL (ref 7.5–12.5)
Monocytes Relative: 6.5 %
Neutro Abs: 2720 cells/uL (ref 1500–7800)
Neutrophils Relative %: 52.3 %
Platelets: 213 10*3/uL (ref 140–400)
RBC: 4.51 10*6/uL (ref 3.80–5.10)
RDW: 13.1 % (ref 11.0–15.0)
Total Lymphocyte: 37.5 %
WBC: 5.2 10*3/uL (ref 3.8–10.8)

## 2019-09-12 ENCOUNTER — Telehealth: Payer: Self-pay | Admitting: *Deleted

## 2019-09-12 MED ORDER — METHOTREXATE SODIUM CHEMO INJECTION 50 MG/2ML: 17.5000 mg | INTRAMUSCULAR | 0 refills | Status: DC

## 2019-09-12 NOTE — Telephone Encounter (Signed)
-----   Message from Pollyann Savoy, MD sent at 09/12/2019  9:14 AM EDT ----- I called patient and discussed labs.  She has mild elevation of creatinine and AST.  She states she still have some swelling but she is feeling much better.  I advised her to reduce methotrexate to 0.7 mL subcu weekly.  I also offered an earlier appo intment but she would like to wait until June 15.  We may consider adding Plaquenil or a biologic agent depending on the amount of swelling she has at the follow-up visit.

## 2019-09-12 NOTE — Progress Notes (Signed)
I called patient and discussed labs.  She has mild elevation of creatinine and AST.  She states she still have some swelling but she is feeling much better.  I advised her to reduce methotrexate to 0.7 mL subcu weekly.  I also offered an earlier appointment but she would like to wait until June 15.  We may consider adding Plaquenil or a biologic agent depending on the amount of swelling she has at the follow-up visit.

## 2019-09-12 NOTE — Telephone Encounter (Signed)
Noted chart with no print to update MTX dose.

## 2019-09-25 ENCOUNTER — Other Ambulatory Visit: Payer: Self-pay | Admitting: Family Medicine

## 2019-09-25 DIAGNOSIS — E1159 Type 2 diabetes mellitus with other circulatory complications: Secondary | ICD-10-CM

## 2019-10-03 NOTE — Progress Notes (Signed)
Office Visit Note  Patient: [name]             Date of Birth: 08-Dec-1945           MRN: [id number]             PCP: Ma Hillock, DO Referring: Denita Lung, MD Visit Date: 10/17/2019 Occupation: '@GUAROCC' @  Subjective:  Left wrist pain  History of Present Illness: [name] is a 74 y.o. female with history of seronegative rheumatoid arthritis and osteoarthritis.  Patient is on methotrexate 0.7 mL subcutaneous injections once weekly and folic acid 2 mg by mouth daily.  She was started on methotrexate in March 2021.  She has been tolerating methotrexate without any side effects.  She has noticed a significant clinical improvement since starting methotrexate.  She states that she continues to have constant pain in the left wrist and notices intermittent swelling.  She states that the rest of her joint pain has resolved.  She is apprehensive to add on combination therapy at this time.   Activities of Daily Living:  Patient reports morning stiffness for  2 hours.   Patient Reports nocturnal pain.  Difficulty dressing/grooming: Denies Difficulty climbing stairs: Denies Difficulty getting out of chair: Denies Difficulty using hands for taps, buttons, cutlery, and/or writing: Reports  Review of Systems  Constitutional: Positive for fatigue.  HENT: Negative for mouth sores, mouth dryness and nose dryness.   Eyes: Negative for pain, visual disturbance and dryness.  Respiratory: Negative for cough, hemoptysis, shortness of breath and difficulty breathing.   Cardiovascular: Negative for chest pain, palpitations, hypertension and swelling in legs/feet.  Gastrointestinal: Negative for blood in stool, constipation and diarrhea.  Endocrine: Negative for increased urination.  Genitourinary: Negative for painful urination.  Musculoskeletal: Positive for arthralgias, joint pain, joint swelling and morning stiffness. Negative for myalgias, muscle weakness, muscle tenderness and  myalgias.  Skin: Negative for color change, pallor, rash, hair loss, nodules/bumps, skin tightness, ulcers and sensitivity to sunlight.  Allergic/Immunologic: Negative for susceptible to infections.  Neurological: Negative for dizziness, numbness, headaches and weakness.  Hematological: Negative for swollen glands.  Psychiatric/Behavioral: Positive for sleep disturbance. Negative for depressed mood. The patient is not nervous/anxious.     PMFS History:  Patient Active Problem List   Diagnosis Date Noted  . Thinning hair 10/13/2019  . Hair loss 10/13/2019  . Sleep disturbance 10/13/2019  . Vitamin D insufficiency 10/10/2019  . Rheumatoid arthritis of multiple sites with negative rheumatoid factor ([place]) 05/29/2019  . Myalgia due to statin 04/11/2019  . 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 Hypermetropia of both eyes 04/22/2018  . Hyperopia with presbyopia 04/22/2018  . Osteopenia 02/14/2016  . Cataract associated with type 2 diabetes mellitus ([place]) 02/14/2016  . Statin intolerance 08/05/2015  . Obesity (BMI 30-39.9) 08/03/2014  . Type 2 diabetes mellitus with complication, without long-term current use of insulin ([place]) 12/12/2013  . Hypertension associated with diabetes ([place]) 02/16/2012  . Occasional cigarette smoker 04/07/2010  . Hyperlipidemia associated with type 2 diabetes mellitus ([place]) 03/18/2010    Past Medical History:  Diagnosis Date  . Allergy   . Arthritis   . Chicken pox   . Colon polyp   . COPD (chronic obstructive pulmonary disease) ([place])   . CVA (cerebral infarction)   . Diabetes mellitus without complication ([place])   . Diverticulitis   . History of blood transfusion   . Hyperlipidemia   . Insomnia   . Nasal  polyps   . Obesity   . Sinusitis, chronic 03/18/2010   Qualifier: Diagnosis of  By: Johnnye Sima MD, Dellis Filbert      Family History  Problem Relation Age of Onset  . Arthritis Mother   . Diabetes Mother        Type I  . Aneurysm  Father   . Stroke Father   . Arthritis Sister   . Arthritis Sister   . Down syndrome Son   . Diabetes Son        type I   Past Surgical History:  Procedure Laterality Date  . ABDOMINAL HYSTERECTOMY  2000  . APPENDECTOMY    . CARDIAC CATHETERIZATION N/A 03/27/2015   Procedure: Left Heart Cath and Coronary Angiography;  Surgeon: Adrian Prows, MD;  Location: [facility];  Service: Cardiovascular;  Laterality: N/A;  . CESAREAN SECTION    . KNEE ARTHROPLASTY    . NASAL SINUS SURGERY    . REPLACEMENT TOTAL KNEE Bilateral 2007 2009  . TONSILLECTOMY AND ADENOIDECTOMY    . WISDOM TOOTH EXTRACTION     Social History   Social History Narrative   Marital status/children/pets: Married, 1 child.    Education/employment: Secretary/administrator educated, retired.   Safety:      -smoke alarm in the home:Yes     - wears seatbelt: Yes     - Feels safe in their relationships: Yes   Immunization History  Administered Date(s) Administered  . Fluad Quad(high Dose 65+) 01/11/2019  . Influenza Whole 04/21/2010  . Influenza, High Dose Seasonal PF 02/20/2013, 02/07/2018  . Influenza,inj,Quad PF,6+ Mos 02/23/2017  . Influenza-Unspecified 02/15/2014, 01/30/2016, 02/07/2018, 01/11/2019  . PFIZER SARS-COV-2 Vaccination 06/08/2019, 07/03/2019  . Pneumococcal Conjugate-13 02/20/2013  . Pneumococcal Polysaccharide-23 04/05/2006  . Pneumococcal-Unspecified 03/05/2015  . Tdap 09/14/2001, 03/05/2015  . Tetanus 03/27/2014  . Zoster 12/07/2013  . Zoster Recombinat (Shingrix) 10/12/2017, 02/07/2018     Objective: Vital Signs: BP 108/73 (BP Location: Left Arm, Patient Position: Sitting, Cuff Size: Large)   Pulse 84   Resp 13   Ht '5\' 4"'  (1.626 m)   Wt 209 lb 3.2 oz (94.9 kg)   BMI 35.91 kg/m    Physical Exam Vitals and nursing note reviewed.  Constitutional:      Appearance: She is well-developed.  HENT:     Head: Normocephalic and atraumatic.  Eyes:     Conjunctiva/sclera: Conjunctivae normal.    Pulmonary:     Effort: Pulmonary effort is normal.  Abdominal:     General: Bowel sounds are normal.     Palpations: Abdomen is soft.  Musculoskeletal:     Cervical back: Normal range of motion.  Lymphadenopathy:     Cervical: No cervical adenopathy.  Skin:    General: Skin is warm and dry.     Capillary Refill: Capillary refill takes less than 2 seconds.  Neurological:     Mental Status: She is alert and oriented to person, place, and time.  Psychiatric:        Behavior: Behavior normal.      Musculoskeletal Exam: C-spine, thoracic spine, lumbar spine good range of motion.  Shoulder joints and elbow joints have good range of motion with no discomfort.  Wrist joints have slightly limited extension.  She has tenderness and mild inflammation of the left wrist on exam.  MCPs, PIPs, DIPs have good range of motion with no synovitis.  Hip joints have good range of motion with no discomfort.  Bilateral knee replacements have good range of  motion with no warmth or effusion.  Ankle joints have good range of motion with no tenderness or warmth.  CDAI Exam: CDAI Score: -- Patient Global: --; Provider Global: -- Swollen: 1 ; Tender: 1  Joint Exam 10/17/2019      Right  Left  Wrist     Swollen Tender     Investigation: No additional findings.  Imaging: No results found.  Recent Labs: Lab Results  Component Value Date   WBC 5.2 09/11/2019   HGB 13.3 09/11/2019   PLT 213 09/11/2019   NA 139 09/11/2019   K 4.3 09/11/2019   CL 104 09/11/2019   CO2 26 09/11/2019   GLUCOSE 137 (H) 09/11/2019   BUN 16 09/11/2019   CREATININE 0.96 (H) 09/11/2019   BILITOT 0.4 09/11/2019   ALKPHOS 44 05/31/2019   AST 40 (H) 09/11/2019   ALT 25 09/11/2019   PROT 7.0 09/11/2019   ALBUMIN 4.4 05/31/2019   CALCIUM 9.0 09/11/2019   GFRAA 69 09/11/2019   QFTBGOLDPLUS NEGATIVE 06/28/2019    Speciality Comments: No specialty comments available.  Procedures:  No procedures performed Allergies:  Ibuprofen and Lipitor [atorvastatin calcium]   Assessment / Plan:     Visit Diagnoses: Rheumatoid arthritis of multiple sites with negative rheumatoid factor ([place]) - All her serology is negative.  Severe inflammatory arthritis: She has tenderness and mild inflammation on the ulnar aspect of the left wrist.  She has limited extension of both wrist joints on exam.  Her left wrist pain has been constant and that she has noticed intermittent joint swelling.  She has occasional difficulty with ADLs due to the discomfort.  She has noticed a significant improvement in her overall joint pain and inflammation since starting on methotrexate.  She has been tolerating methotrexate 0.7 mL subcutaneous injections every week and folic acid 2 mg by mouth daily.  We discussed that she may require combination therapy but she declined at this time.  We will schedule an ultrasound-guided left wrist joint cortisone injection due to the severity and chronicity of her discomfort.  She will follow-up in 6 weeks and if she continues to have persistent pain and inflammation we will further discuss combination therapy.  High risk medication use - MTX 0.69m sq once weekly and folic acid 2 mg po daily.  She was started on methotrexate in March 2021.  CBC and CMP were drawn on 09/11/2019.  She will be due to update lab work in August and every 3 months to monitor for drug toxicity. She has received both COVID-19 vaccinations.  She was questioning the efficacy of the COVID-19 vaccination in patients who are immunosuppressed.  We also discussed the importance of holding methotrexate if develops any signs or symptoms of an infection and to resume once the infection has completely cleared.  Status post total bilateral knee replacement: Doing well.  She has good range of motion with no discomfort.  No warmth or effusion was noted.  Primary osteoarthritis of both feet: She has PIP and DIP thickening consistent with osteoarthritis of both  feet.  She is not having any discomfort in her feet at this time.  She has good range of motion of both ankle joints with no tenderness or warmth on exam.  Other fatigue: Chronic and secondary to insomnia.   Osteopenia of multiple sites - History of osteopenia. DEXA on 12/14/17: The BMD measured at Femur Neck Right is 0.971 g/cm2 with a T-score of -0.5. Repeat DEXA in 5 years.  Low serum alkaline phosphatase - Alk phos was low at 33 and has trended down to 30 on 08/11/19. Phosphorus WNL on 07/20/19.  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  Meds ordered this encounter  Medications  . methotrexate 50 MG/2ML injection    Sig: Inject 0.7 mLs (17.5 mg total) into the skin once a week.    Dispense:  9 mL    Refill:  0    Face-to-face time spent with patient was 30 minutes. Greater than 50% of time was spent in counseling and coordination of care.  Follow-Up Instructions: Return in about 6 weeks (around 11/28/2019) for Rheumatoid arthritis.   Ofilia Neas, PA-C  Note - This record has been created using Dragon software.  Chart creation errors have been sought, but may not always  have been located. Such creation errors do not reflect on  the standard of medical care.

## 2019-10-04 ENCOUNTER — Telehealth: Payer: Self-pay | Admitting: Family Medicine

## 2019-10-04 NOTE — Telephone Encounter (Signed)
Pt called and is changing to [employer].  She has appointment on Monday.  She is requesting records to be faxed. I advised pt that we are on the same computer system.  She states Dr Claiborne Billings still wants records faxed.  Please send.

## 2019-10-05 NOTE — Telephone Encounter (Signed)
I called pt's new doc.to inquire and they states they do not need paper records faxed. She confirmed what they need is in epic.

## 2019-10-09 ENCOUNTER — Encounter: Payer: Self-pay | Admitting: Family Medicine

## 2019-10-09 ENCOUNTER — Other Ambulatory Visit: Payer: Self-pay

## 2019-10-09 ENCOUNTER — Ambulatory Visit (INDEPENDENT_AMBULATORY_CARE_PROVIDER_SITE_OTHER): Payer: PPO | Admitting: Family Medicine

## 2019-10-09 VITALS — BP 118/81 | HR 80 | Temp 97.7°F | Resp 17 | Ht 64.0 in | Wt 208.4 lb

## 2019-10-09 DIAGNOSIS — E669 Obesity, unspecified: Secondary | ICD-10-CM | POA: Diagnosis not present

## 2019-10-09 DIAGNOSIS — M8589 Other specified disorders of bone density and structure, multiple sites: Secondary | ICD-10-CM | POA: Diagnosis not present

## 2019-10-09 DIAGNOSIS — E1136 Type 2 diabetes mellitus with diabetic cataract: Secondary | ICD-10-CM

## 2019-10-09 DIAGNOSIS — G479 Sleep disorder, unspecified: Secondary | ICD-10-CM

## 2019-10-09 DIAGNOSIS — E1159 Type 2 diabetes mellitus with other circulatory complications: Secondary | ICD-10-CM

## 2019-10-09 DIAGNOSIS — Z789 Other specified health status: Secondary | ICD-10-CM

## 2019-10-09 DIAGNOSIS — E559 Vitamin D deficiency, unspecified: Secondary | ICD-10-CM

## 2019-10-09 DIAGNOSIS — E118 Type 2 diabetes mellitus with unspecified complications: Secondary | ICD-10-CM | POA: Diagnosis not present

## 2019-10-09 DIAGNOSIS — E1169 Type 2 diabetes mellitus with other specified complication: Secondary | ICD-10-CM | POA: Diagnosis not present

## 2019-10-09 DIAGNOSIS — E785 Hyperlipidemia, unspecified: Secondary | ICD-10-CM | POA: Diagnosis not present

## 2019-10-09 DIAGNOSIS — L659 Nonscarring hair loss, unspecified: Secondary | ICD-10-CM | POA: Diagnosis not present

## 2019-10-09 DIAGNOSIS — M0609 Rheumatoid arthritis without rheumatoid factor, multiple sites: Secondary | ICD-10-CM | POA: Diagnosis not present

## 2019-10-09 DIAGNOSIS — I10 Essential (primary) hypertension: Secondary | ICD-10-CM

## 2019-10-09 LAB — POCT GLYCOSYLATED HEMOGLOBIN (HGB A1C)
HbA1c POC (<> result, manual entry): 7.4 % (ref 4.0–5.6)
HbA1c, POC (controlled diabetic range): 7.4 % — AB (ref 0.0–7.0)
HbA1c, POC (prediabetic range): 7.4 % — AB (ref 5.7–6.4)
Hemoglobin A1C: 7.4 % — AB (ref 4.0–5.6)

## 2019-10-09 MED ORDER — METFORMIN HCL 500 MG PO TABS: 500.0000 mg | ORAL_TABLET | Freq: Two times a day (BID) | ORAL | 1 refills | Status: DC

## 2019-10-09 MED ORDER — FENOFIBRATE 160 MG PO TABS: 160.0000 mg | ORAL_TABLET | Freq: Every day | ORAL | 1 refills | Status: DC

## 2019-10-09 MED ORDER — LOSARTAN POTASSIUM 25 MG PO TABS: 25.0000 mg | ORAL_TABLET | Freq: Every day | ORAL | 1 refills | Status: DC

## 2019-10-09 NOTE — Progress Notes (Signed)
Patient ID: [name], female  DOB: 1945/05/12, 75 y.o.   MRN: [id number] Patient Care Team    Relationship Specialty Notifications Start End  Ma Hillock, DO PCP - General Family Medicine  10/10/19   Bo Merino, MD Consulting Physician Rheumatology  10/10/19   Juanita Craver, MD Consulting Physician Gastroenterology  10/10/19   Leslie Andrea, OD  Optometry  10/10/19     Chief Complaint  Patient presents with  . Establish Care    Blood sugar is up and down since being on methotrexate.     Subjective:  [name] is a 74 y.o.  female present for new patient establishment/CMC. All past medical history, surgical history, allergies, family history, immunizations, medications and social history were updated in the electronic medical record today. All recent labs, ED visits and hospitalizations within the last year were reviewed.  Hypertension associated with diabetes (HCC)/obesity/statin intolerance/hyperlipidemia Pt reports compliance with losartan 25 mg daily and fenofibrate. Blood pressures ranges at home limits. Patient denies chest pain, shortness of breath or lower extremity edema. Pt takes a daily baby ASA.  Patient is intolerant to statins.    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  Patient is prescribed methotrexate and folate.  Type 2 diabetes mellitus with unspecified complications (HCC)/cataracts associated with type 2 diabetes Pt reports compliance with metformin 500 mg twice daily.  She reports she was diagnosed with diabetes about 20 years ago. Denies numbness, tingling of extremities, hypo/hyperglycemic events or non-healing wounds. 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  She is managed closely by her  ophthalmology team Dr. Diona Foley A1c: Last A1c 7.7> A1c 7.4 today.  Thinning hair/hair loss/vitamin D deficiency Patient reports she has always had thin hair but she does feel like her hair has been thinning even more over the last few months.  Sleep disturbance Patient reports she is prescribed Ambien 5 mg nightly as needed.  She very rarely takes this medication.  When she does she takes about a third of a tab.  She reports she still has medications that she filled last June.  She does not need refills on this today.  Depression screen [facility] 2/9 10/18/2018 02/14/2018 10/20/2016 08/05/2015 03/27/2014  Decreased Interest - 0 0 0 0  Down, Depressed, Hopeless 0 0 0 0 0  PHQ - 2 Score 0 0 0 0 0   No flowsheet data found.      Fall Risk  10/18/2018 02/14/2018 10/20/2016 08/05/2015 03/27/2014  Falls in the past year? 0 No No No No    Immunization History  Administered Date(s) Administered  . Fluad Quad(high Dose 65+) 01/11/2019  . Influenza Whole 04/21/2010  . Influenza, High Dose Seasonal PF 02/20/2013, 02/07/2018  . Influenza,inj,Quad PF,6+ Mos 02/23/2017  . Influenza-Unspecified 02/15/2014, 01/30/2016, 02/07/2018, 01/11/2019  . PFIZER SARS-COV-2 Vaccination 06/08/2019, 07/03/2019  . Pneumococcal Conjugate-13 02/20/2013  . Pneumococcal Polysaccharide-23 04/05/2006  . Pneumococcal-Unspecified 03/05/2015  . Tdap 09/14/2001, 03/05/2015  . Tetanus 03/27/2014  . Zoster 12/07/2013  . Zoster Recombinat (Shingrix) 10/12/2017, 02/07/2018    No exam data present  Past Medical History:  Diagnosis Date  . Allergy   . Arthritis   . Chicken pox   . Colon polyp   . COPD (chronic obstructive pulmonary disease) ([place])   . CVA (cerebral infarction)   .  Diabetes mellitus without complication (HCC)   . Diverticulitis   . History of blood transfusion   . Hyperlipidemia   . Insomnia   . Nasal polyps   . Obesity   . Sinusitis, chronic 03/18/2010   Qualifier: Diagnosis of  By: Ninetta Lights MD, Tinnie Gens      Allergies  Allergen Reactions  . Ibuprofen Shortness Of Breath and Other (See Comments)    , REACTION: chest tightness  . Lipitor [Atorvastatin Calcium]    Past Surgical History:  Procedure Laterality Date  . ABDOMINAL HYSTERECTOMY  2000  . APPENDECTOMY    . CARDIAC CATHETERIZATION N/A 03/27/2015   Procedure: Left Heart Cath and Coronary Angiography;  Surgeon: Yates Decamp, MD;  Location: [facility];  Service: Cardiovascular;  Laterality: N/A;  . CESAREAN SECTION    . KNEE ARTHROPLASTY    . NASAL SINUS SURGERY    . REPLACEMENT TOTAL KNEE Bilateral 2007 2009  . TONSILLECTOMY AND ADENOIDECTOMY    . WISDOM TOOTH EXTRACTION     Family History  Problem Relation Age of Onset  . Arthritis Mother   . Diabetes Mother        Type I  . Aneurysm Father   . Stroke Father   . Arthritis Sister   . Arthritis Sister   . Down syndrome Son   . Diabetes Son        type I   Social History   Social History Narrative   Marital status/children/pets: Married, 1 child.    Education/employment: Automotive engineer educated, retired.   Safety:      -smoke alarm in the home:Yes     - wears seatbelt: Yes     - Feels safe in their relationships: Yes    Allergies as of 10/09/2019      Reactions   Ibuprofen Shortness Of Breath, Other (See Comments)   , REACTION: chest tightness   Lipitor [atorvastatin Calcium]       Medication List       Accurate as of October 09, 2019 11:59 PM. If you have any questions, ask your nurse or doctor.        STOP taking these medications   naproxen sodium 220 MG tablet Commonly known as: ALEVE Stopped by: Felix Pacini, DO   predniSONE 5 MG tablet Commonly known as: DELTASONE Stopped by: Felix Pacini, DO     TAKE these medications   aspirin EC 81 MG tablet Take 81 mg by mouth daily.   fenofibrate 160 MG tablet Take 1 tablet (160 mg total) by mouth daily.   folic acid 1 MG tablet Commonly known as: FOLVITE Take 2 tablets (2 mg total) by mouth daily.    losartan 25 MG tablet Commonly known as: COZAAR Take 1 tablet (25 mg total) by mouth daily.   MECLIZINE HCL PO Take by mouth as needed.   metFORMIN 500 MG tablet Commonly known as: GLUCOPHAGE Take 1 tablet (500 mg total) by mouth 2 (two) times daily.   methotrexate 50 MG/2ML injection Inject 0.7 mLs (17.5 mg total) into the skin once a week.   multivitamin with minerals tablet Take 1 tablet by mouth daily.   OMEGA 3 PO Take by mouth.   TUBERCULIN SYR 1CC/27GX1/2" 27G X 1/2" 1 ML Misc Commonly known as: B-D TB SYRINGE 1CC/27GX1/2" 12 Syringes by Does not apply route once a week.   zolpidem 5 MG tablet Commonly known as: AMBIEN Take 5 mg by mouth at bedtime as needed for sleep.  All past medical history, surgical history, allergies, family history, immunizations andmedications were updated in the EMR today and reviewed under the history and medication portions of their EMR.      ROS: 14 pt review of systems performed and negative (unless mentioned in an HPI)  Objective: BP 118/81 (BP Location: Left Arm, Patient Position: Sitting, Cuff Size: Large)   Pulse 80   Temp 97.7 F (36.5 C) (Temporal)   Resp 17   Ht 5\' 4"  (1.626 m)   Wt 208 lb 6 oz (94.5 kg)   SpO2 96%   BMI 35.77 kg/m  Gen: Afebrile. No acute distress. Nontoxic in appearance, well-developed, well-nourished, pleasant female. HENT: AT. [place].  No cough or hoarseness on exam  eyes:Pupils Equal Round Reactive to light, Extraocular movements intact,  Conjunctiva without redness, discharge or icterus. Neck/lymp/endocrine: Supple, no lymphadenopathy, no thyromegaly CV: RRR no murmur, no edema Chest: CTAB, no wheeze, rhonchi or crackles.  Skin: no rashes, purpura or petechiae. Warm and well-perfused. Skin intact. Neuro/Msk: Normal gait. PERLA. EOMi. Alert. Oriented x3.   Psych: Normal affect, dress and demeanor. Normal speech. Normal thought content and judgment.  Assessment/plan: [name] is a 74  y.o. female present for est care Hypertension associated with diabetes (HCC)/obesity/statin intolerance/hyperlipidemia Stable. Continue Metformin 500 mg twice daily. Continue routine exercise and low-sodium diet. Continue losartan 25 mg daily. Continue fenofibrate. Statin intolerant. Follow-up in 4 months with other chronic conditions.  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  Type 2 diabetes mellitus with unspecified complications (HCC)/cataracts associated with type 2 diabetes A1c 7.4 today. Continue Metformin 500 mg twice daily for now.  She had been on prednisone which was just recently stopped therefore hopefully her sugars will regulate.  We will consider increasing on follow-up of levels and can eat. 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  She is managed closely by her ophthalmology team Dr. 07/2019 hair/hair loss/vitamin D deficiency Patient is on methotrexate reports she is compliant with folate.  Will check vitamin D levels and iron levels today to rule out as potential cause. - Vitamin D (25 hydroxy) - Iron, TIBC and Ferritin Panel - Folate  Return in about 15 days (around 10/24/2019), or if symptoms worsen or fail to improve, for CPE (30 min).  Orders Placed This Encounter  Procedures  . Vitamin D (25 hydroxy)  . Iron, TIBC and Ferritin Panel  . Folate  . POCT glycosylated hemoglobin (Hb A1C)   Meds ordered this encounter  Medications  . losartan (COZAAR) 25 MG tablet    Sig: Take 1 tablet (25 mg total) by mouth daily.    Dispense:  90 tablet    Refill:  1  . metFORMIN (GLUCOPHAGE) 500 MG tablet    Sig: Take 1 tablet (500 mg total) by mouth 2 (two) times daily.    Dispense:  180 tablet    Refill:  1  .  fenofibrate 160 MG tablet    Sig: Take 1 tablet (160 mg total) by mouth daily.    Dispense:  90 tablet    Refill:  1   Referral Orders  No referral(s) requested today     Note is dictated utilizing voice recognition software. Although note has been proof read prior to signing, occasional typographical errors still can be missed. If any  questions arise, please do not hesitate to call for verification.  Electronically signed by: Howard Pouch, DO [place]

## 2019-10-09 NOTE — Patient Instructions (Addendum)
I have refilled your medications.  I will call you with lab results.  Biotin supplement is ok.    It was wonderful to meet you today.

## 2019-10-10 ENCOUNTER — Encounter: Payer: Self-pay | Admitting: Family Medicine

## 2019-10-10 DIAGNOSIS — E559 Vitamin D deficiency, unspecified: Secondary | ICD-10-CM | POA: Insufficient documentation

## 2019-10-10 LAB — IRON,TIBC AND FERRITIN PANEL
%SAT: 20 % (calc) (ref 16–45)
Ferritin: 122 ng/mL (ref 16–288)
Iron: 69 ug/dL (ref 45–160)
TIBC: 342 mcg/dL (calc) (ref 250–450)

## 2019-10-10 LAB — VITAMIN D 25 HYDROXY (VIT D DEFICIENCY, FRACTURES): Vit D, 25-Hydroxy: 25 ng/mL — ABNORMAL LOW (ref 30–100)

## 2019-10-10 LAB — FOLATE: Folate: >24 ng/mL

## 2019-10-13 ENCOUNTER — Encounter: Payer: Self-pay | Admitting: Family Medicine

## 2019-10-13 DIAGNOSIS — L659 Nonscarring hair loss, unspecified: Secondary | ICD-10-CM | POA: Insufficient documentation

## 2019-10-13 DIAGNOSIS — G479 Sleep disorder, unspecified: Secondary | ICD-10-CM | POA: Insufficient documentation

## 2019-10-15 ENCOUNTER — Other Ambulatory Visit: Payer: Self-pay | Admitting: Rheumatology

## 2019-10-15 DIAGNOSIS — M0609 Rheumatoid arthritis without rheumatoid factor, multiple sites: Secondary | ICD-10-CM

## 2019-10-17 ENCOUNTER — Ambulatory Visit: Payer: PPO | Admitting: Physician Assistant

## 2019-10-17 ENCOUNTER — Encounter: Payer: Self-pay | Admitting: Physician Assistant

## 2019-10-17 ENCOUNTER — Other Ambulatory Visit: Payer: Self-pay

## 2019-10-17 VITALS — BP 108/73 | HR 84 | Resp 13 | Ht 64.0 in | Wt 209.2 lb

## 2019-10-17 DIAGNOSIS — M0609 Rheumatoid arthritis without rheumatoid factor, multiple sites: Secondary | ICD-10-CM

## 2019-10-17 DIAGNOSIS — E118 Type 2 diabetes mellitus with unspecified complications: Secondary | ICD-10-CM

## 2019-10-17 DIAGNOSIS — R5383 Other fatigue: Secondary | ICD-10-CM | POA: Diagnosis not present

## 2019-10-17 DIAGNOSIS — M19071 Primary osteoarthritis, right ankle and foot: Secondary | ICD-10-CM | POA: Diagnosis not present

## 2019-10-17 DIAGNOSIS — R748 Abnormal levels of other serum enzymes: Secondary | ICD-10-CM | POA: Diagnosis not present

## 2019-10-17 DIAGNOSIS — Z79899 Other long term (current) drug therapy: Secondary | ICD-10-CM | POA: Diagnosis not present

## 2019-10-17 DIAGNOSIS — E1159 Type 2 diabetes mellitus with other circulatory complications: Secondary | ICD-10-CM

## 2019-10-17 DIAGNOSIS — M19072 Primary osteoarthritis, left ankle and foot: Secondary | ICD-10-CM

## 2019-10-17 DIAGNOSIS — M791 Myalgia, unspecified site: Secondary | ICD-10-CM | POA: Diagnosis not present

## 2019-10-17 DIAGNOSIS — Z789 Other specified health status: Secondary | ICD-10-CM | POA: Diagnosis not present

## 2019-10-17 DIAGNOSIS — H47013 Ischemic optic neuropathy, bilateral: Secondary | ICD-10-CM

## 2019-10-17 DIAGNOSIS — E785 Hyperlipidemia, unspecified: Secondary | ICD-10-CM

## 2019-10-17 DIAGNOSIS — Z720 Tobacco use: Secondary | ICD-10-CM

## 2019-10-17 DIAGNOSIS — I152 Hypertension secondary to endocrine disorders: Secondary | ICD-10-CM

## 2019-10-17 DIAGNOSIS — Z96653 Presence of artificial knee joint, bilateral: Secondary | ICD-10-CM

## 2019-10-17 DIAGNOSIS — M8589 Other specified disorders of bone density and structure, multiple sites: Secondary | ICD-10-CM | POA: Diagnosis not present

## 2019-10-17 DIAGNOSIS — E1169 Type 2 diabetes mellitus with other specified complication: Secondary | ICD-10-CM | POA: Diagnosis not present

## 2019-10-17 DIAGNOSIS — T466X5A Adverse effect of antihyperlipidemic and antiarteriosclerotic drugs, initial encounter: Secondary | ICD-10-CM

## 2019-10-17 DIAGNOSIS — E1136 Type 2 diabetes mellitus with diabetic cataract: Secondary | ICD-10-CM

## 2019-10-17 DIAGNOSIS — I10 Essential (primary) hypertension: Secondary | ICD-10-CM

## 2019-10-17 DIAGNOSIS — Z8709 Personal history of other diseases of the respiratory system: Secondary | ICD-10-CM

## 2019-10-17 MED ORDER — METHOTREXATE SODIUM CHEMO INJECTION 50 MG/2ML: 17.5000 mg | INTRAMUSCULAR | 0 refills | Status: DC

## 2019-10-17 NOTE — Patient Instructions (Signed)
Standing Labs We placed an order today for your standing lab work.    Please come back and get your standing labs in August and every 3 months   CBC and CMP    We have open lab daily Monday through Thursday from 8:30-12:30 PM and 1:30-4:30 PM and Friday from 8:30-12:30 PM and 1:30-4:00 PM at the office of Dr. Pollyann Savoy.   You may experience shorter wait times on Monday and Friday afternoons. The office is located at [address], [place], [place] [address] No appointment is necessary.   Labs are drawn by [facility].  You may receive a bill from [place] [facility] for your lab work.  If you wish to have your labs drawn at another location, please call the office 24 hours in advance to send orders.  If you have any questions regarding directions or hours of operation,  please call [phone].   Just as a reminder please drink plenty of water prior to coming for your lab work. Thanks!

## 2019-10-24 ENCOUNTER — Ambulatory Visit: Payer: PPO | Admitting: Family Medicine

## 2019-10-26 ENCOUNTER — Other Ambulatory Visit: Payer: Self-pay

## 2019-10-26 ENCOUNTER — Encounter: Payer: Self-pay | Admitting: Family Medicine

## 2019-10-26 ENCOUNTER — Ambulatory Visit (INDEPENDENT_AMBULATORY_CARE_PROVIDER_SITE_OTHER): Payer: PPO | Admitting: Family Medicine

## 2019-10-26 VITALS — BP 117/81 | HR 88 | Temp 97.8°F | Resp 17 | Ht 64.0 in | Wt 206.2 lb

## 2019-10-26 DIAGNOSIS — R82998 Other abnormal findings in urine: Secondary | ICD-10-CM

## 2019-10-26 DIAGNOSIS — E1159 Type 2 diabetes mellitus with other circulatory complications: Secondary | ICD-10-CM | POA: Diagnosis not present

## 2019-10-26 DIAGNOSIS — E785 Hyperlipidemia, unspecified: Secondary | ICD-10-CM | POA: Diagnosis not present

## 2019-10-26 DIAGNOSIS — Z0001 Encounter for general adult medical examination with abnormal findings: Secondary | ICD-10-CM | POA: Diagnosis not present

## 2019-10-26 DIAGNOSIS — E1169 Type 2 diabetes mellitus with other specified complication: Secondary | ICD-10-CM | POA: Diagnosis not present

## 2019-10-26 DIAGNOSIS — L817 Pigmented purpuric dermatosis: Secondary | ICD-10-CM | POA: Diagnosis not present

## 2019-10-26 DIAGNOSIS — I10 Essential (primary) hypertension: Secondary | ICD-10-CM

## 2019-10-26 DIAGNOSIS — M8589 Other specified disorders of bone density and structure, multiple sites: Secondary | ICD-10-CM | POA: Diagnosis not present

## 2019-10-26 DIAGNOSIS — E669 Obesity, unspecified: Secondary | ICD-10-CM | POA: Diagnosis not present

## 2019-10-26 DIAGNOSIS — Z7185 Encounter for immunization safety counseling: Secondary | ICD-10-CM

## 2019-10-26 DIAGNOSIS — Z7189 Other specified counseling: Secondary | ICD-10-CM

## 2019-10-26 DIAGNOSIS — R109 Unspecified abdominal pain: Secondary | ICD-10-CM

## 2019-10-26 LAB — LIPID PANEL
Cholesterol: 194 mg/dL (ref 0–200)
HDL: 37.7 mg/dL — ABNORMAL LOW (ref >39.00)
NonHDL: 155.88
Total CHOL/HDL Ratio: 5
Triglycerides: 230 mg/dL — ABNORMAL HIGH (ref 0.0–149.0)
VLDL: 46 mg/dL — ABNORMAL HIGH (ref 0.0–40.0)

## 2019-10-26 LAB — LDL CHOLESTEROL, DIRECT: Direct LDL: 120 mg/dL

## 2019-10-26 NOTE — Patient Instructions (Signed)
Health Maintenance After Age 72 After age 72, you are at a higher risk for certain long-term diseases and infections as well as injuries from falls. Falls are a major cause of broken bones and head injuries in people who are older than age 72. Getting regular preventive care can help to keep you healthy and well. Preventive care includes getting regular testing and making lifestyle changes as recommended by your health care provider. Talk with your health care provider about:  Which screenings and tests you should have. A screening is a test that checks for a disease when you have no symptoms.  A diet and exercise plan that is right for you. What should I know about screenings and tests to prevent falls? Screening and testing are the best ways to find a health problem early. Early diagnosis and treatment give you the best chance of managing medical conditions that are common after age 72. Certain conditions and lifestyle choices may make you more likely to have a fall. Your health care provider may recommend:  Regular vision checks. Poor vision and conditions such as cataracts can make you more likely to have a fall. If you wear glasses, make sure to get your prescription updated if your vision changes.  Medicine review. Work with your health care provider to regularly review all of the medicines you are taking, including over-the-counter medicines. Ask your health care provider about any side effects that may make you more likely to have a fall. Tell your health care provider if any medicines that you take make you feel dizzy or sleepy.  Osteoporosis screening. Osteoporosis is a condition that causes the bones to get weaker. This can make the bones weak and cause them to break more easily.  Blood pressure screening. Blood pressure changes and medicines to control blood pressure can make you feel dizzy.  Strength and balance checks. Your health care provider may recommend certain tests to check your  strength and balance while standing, walking, or changing positions.  Foot health exam. Foot pain and numbness, as well as not wearing proper footwear, can make you more likely to have a fall.  Depression screening. You may be more likely to have a fall if you have a fear of falling, feel emotionally low, or feel unable to do activities that you used to do.  Alcohol use screening. Using too much alcohol can affect your balance and may make you more likely to have a fall. What actions can I take to lower my risk of falls? General instructions  Talk with your health care provider about your risks for falling. Tell your health care provider if: ? You fall. Be sure to tell your health care provider about all falls, even ones that seem minor. ? You feel dizzy, sleepy, or off-balance.  Take over-the-counter and prescription medicines only as told by your health care provider. These include any supplements.  Eat a healthy diet and maintain a healthy weight. A healthy diet includes low-fat dairy products, low-fat (lean) meats, and fiber from whole grains, beans, and lots of fruits and vegetables. Home safety  Remove any tripping hazards, such as rugs, cords, and clutter.  Install safety equipment such as grab bars in bathrooms and safety rails on stairs.  Keep rooms and walkways well-lit. Activity   Follow a regular exercise program to stay fit. This will help you maintain your balance. Ask your health care provider what types of exercise are appropriate for you.  If you need a cane or   walker, use it as recommended by your health care provider.  Wear supportive shoes that have nonskid soles. Lifestyle  Do not drink alcohol if your health care provider tells you not to drink.  If you drink alcohol, limit how much you have: ? 0-1 drink a day for women. ? 0-2 drinks a day for men.  Be aware of how much alcohol is in your drink. In the [place] one drink equals one typical bottle of beer (12  oz), one-half glass of wine (5 oz), or one shot of hard liquor (1 oz).  Do not use any products that contain nicotine or tobacco, such as cigarettes and e-cigarettes. If you need help quitting, ask your health care provider. Summary  Having a healthy lifestyle and getting preventive care can help to protect your health and wellness after age 72.  Screening and testing are the best way to find a health problem early and help you avoid having a fall. Early diagnosis and treatment give you the best chance for managing medical conditions that are more common for people who are older than age 72.  Falls are a major cause of broken bones and head injuries in people who are older than age 72. Take precautions to prevent a fall at home.  Work with your health care provider to learn what changes you can make to improve your health and wellness and to prevent falls. This information is not intended to replace advice given to you by your health care provider. Make sure you discuss any questions you have with your health care provider. Document Revised: 08/11/2018 Document Reviewed: 03/03/2017 Elsevier Patient Education  2020 [employer]vier Inc.  

## 2019-10-26 NOTE — Progress Notes (Signed)
This visit occurred during the SARS-CoV-2 public health emergency.  Safety protocols were in place, including screening questions prior to the visit, additional usage of staff PPE, and extensive cleaning of exam room while observing appropriate contact time as indicated for disinfecting solutions.    Patient ID: [name], female  DOB: 1946/02/10, 74 y.o.   MRN: [id number] Patient Care Team    Relationship Specialty Notifications Start End  Natalia Leatherwood, DO PCP - General Family Medicine  10/10/19   Pollyann Savoy, MD Consulting Physician Rheumatology  10/10/19   Charna Elizabeth, MD Consulting Physician Gastroenterology  10/10/19   Renae Fickle, OD  Optometry  10/10/19     Chief Complaint  Patient presents with  . Annual Exam    Fasting. Mammogram 01/20/2019. Colonoscopy- 2020. Bone density- 12/14/2017    Subjective:  [name] is a 74 y.o.  Female  present for CPE with acute concerns. All past medical history, surgical history, allergies, family history, immunizations, medications and social history were updated in the electronic medical record today. All recent labs, ED visits and hospitalizations within the last year were reviewed.  Health maintenance:  Colonoscopy: completed 03/27/2019, by Dr. Lolly Mustache further colon cancer screenings needed. Mammogram: completed: 01/19/2019, normal.  Breast center in [place].  Patient desires every 2 years screening. Cervical cancer screening:  N/A Immunizations: tdap 03/2015, Influenza 01/2019 (encouraged yearly), PNA series completed, shingrix completed, Covid series completed Infectious disease screening: Hep C completed DEXA: last completed 12/2017- normal Assistive device: None Oxygen use: None Patient has a Dental home. Hospitalizations/ED visits: Reviewed  Pelvic cramping, abnormal urine: Patient reports she has noticed dark urine over the last few weeks, this is out of the ordinary for her.  She also has noticed lower  pelvic cramping she describes as "pulling or tugging feeling.  "She denies any fever, chills, nausea, vomit.  Patient is on methotrexate for her rheumatological condition.  She reports she had both of her Moderna Covid vaccines.  She has concerns she is not mounting antibodies against Covid secondary to her rheumatological medications.  She had a antibody test completed at [facility] by fingerstick and it was negative.  She reports her friends test was positive.  Depression screen [facility] 2/9 10/26/2019 10/18/2018 02/14/2018 10/20/2016 08/05/2015  Decreased Interest 0 - 0 0 0  Down, Depressed, Hopeless 0 0 0 0 0  PHQ - 2 Score 0 0 0 0 0   No flowsheet data found.   Immunization History  Administered Date(s) Administered  . Fluad Quad(high Dose 65+) 01/11/2019  . Influenza Whole 04/21/2010  . Influenza, High Dose Seasonal PF 02/20/2013, 02/07/2018  . Influenza,inj,Quad PF,6+ Mos 02/23/2017  . Influenza-Unspecified 02/15/2014, 01/30/2016, 02/07/2018, 01/11/2019  . PFIZER SARS-COV-2 Vaccination 06/08/2019, 07/03/2019  . Pneumococcal Conjugate-13 02/20/2013  . Pneumococcal Polysaccharide-23 04/05/2006  . Pneumococcal-Unspecified 03/05/2015  . Tdap 09/14/2001, 03/05/2015  . Tetanus 03/27/2014  . Zoster 12/07/2013  . Zoster Recombinat (Shingrix) 10/12/2017, 02/07/2018     Past Medical History:  Diagnosis Date  . Allergy   . Arthritis   . Chicken pox   . Colon polyp   . COPD (chronic obstructive pulmonary disease) (HCC)   . CVA (cerebral infarction)   . Diabetes mellitus without complication (HCC)   . Diverticulitis   . History of blood transfusion   . Hyperlipidemia   . Insomnia   . Nasal polyps   . Obesity   . Sinusitis, chronic 03/18/2010   Qualifier: Diagnosis of  By: Ninetta Lights MD,  Tinnie Gens     Allergies  Allergen Reactions  . Ibuprofen Shortness Of Breath and Other (See Comments)    , REACTION: chest tightness  . Lipitor [Atorvastatin Calcium]    Past Surgical History:    Procedure Laterality Date  . ABDOMINAL HYSTERECTOMY  2000  . APPENDECTOMY    . CARDIAC CATHETERIZATION N/A 03/27/2015   Procedure: Left Heart Cath and Coronary Angiography;  Surgeon: Yates Decamp, MD;  Location: [facility] INVASIVE CV LAB;  Service: Cardiovascular;  Laterality: N/A;  . CESAREAN SECTION    . KNEE ARTHROPLASTY    . NASAL SINUS SURGERY    . REPLACEMENT TOTAL KNEE Bilateral 2007 2009  . TONSILLECTOMY AND ADENOIDECTOMY    . WISDOM TOOTH EXTRACTION     Family History  Problem Relation Age of Onset  . Arthritis Mother   . Diabetes Mother        Type I  . Aneurysm Father   . Stroke Father   . Arthritis Sister   . Arthritis Sister   . Down syndrome Son   . Diabetes Son        type I   Social History   Social History Narrative   Marital status/children/pets: Married, 1 child.    Education/employment: Automotive engineer educated, retired.   Safety:      -smoke alarm in the home:Yes     - wears seatbelt: Yes     - Feels safe in their relationships: Yes    Allergies as of 10/26/2019      Reactions   Ibuprofen Shortness Of Breath, Other (See Comments)   , REACTION: chest tightness   Lipitor [atorvastatin Calcium]       Medication List       Accurate as of October 26, 2019  2:42 PM. If you have any questions, ask your nurse or doctor.        aspirin EC 81 MG tablet Take 81 mg by mouth daily.   cholecalciferol 25 MCG (1000 UNIT) tablet Commonly known as: VITAMIN D3 Take 1,000 Units by mouth daily.   fenofibrate 160 MG tablet Take 1 tablet (160 mg total) by mouth daily.   folic acid 1 MG tablet Commonly known as: FOLVITE Take 2 tablets (2 mg total) by mouth daily.   losartan 25 MG tablet Commonly known as: COZAAR Take 1 tablet (25 mg total) by mouth daily.   MECLIZINE HCL PO Take by mouth as needed.   metFORMIN 500 MG tablet Commonly known as: GLUCOPHAGE Take 1 tablet (500 mg total) by mouth 2 (two) times daily.   methotrexate 50 MG/2ML injection Inject 0.7 mLs  (17.5 mg total) into the skin once a week.   multivitamin with minerals tablet Take 1 tablet by mouth daily.   OMEGA 3 PO Take by mouth.   TUBERCULIN SYR 1CC/27GX1/2" 27G X 1/2" 1 ML Misc Commonly known as: B-D TB SYRINGE 1CC/27GX1/2" 12 Syringes by Does not apply route once a week.   zolpidem 5 MG tablet Commonly known as: AMBIEN Take 5 mg by mouth at bedtime as needed for sleep.       All past medical history, surgical history, allergies, family history, immunizations andmedications were updated in the EMR today and reviewed under the history and medication portions of their EMR.      MM 3D SCREEN BREAST BILATERAL  Result Date: 01/20/2019 CLINICAL DATA:  Screening. EXAM: DIGITAL SCREENING BILATERAL MAMMOGRAM WITH TOMO AND CAD COMPARISON:  Previous exam(s). ACR Breast Density Category b: There are scattered areas  of fibroglandular density. FINDINGS: There are no findings suspicious for malignancy. Images were processed with CAD. IMPRESSION: No mammographic evidence of malignancy. A result letter of this screening mammogram will be mailed directly to the patient. RECOMMENDATION: Screening mammogram in one year. (Code:SM-B-01Y) BI-RADS CATEGORY  1: Negative. Electronically Signed   By: Britta Mccreedy M.D.   On: 01/20/2019 10:25     ROS: 14 pt review of systems performed and negative (unless mentioned in an HPI)  Objective: BP 117/81 (BP Location: Left Arm, Patient Position: Sitting, Cuff Size: Large)   Pulse 88   Temp 97.8 F (36.6 C) (Temporal)   Resp 17   Ht 5\' 4"  (1.626 m)   Wt 206 lb 4 oz (93.6 kg)   SpO2 98%   BMI 35.40 kg/m  Gen: Afebrile. No acute distress. Nontoxic in appearance, well-developed, well-nourished, pleasant female HENT: AT. [place]. Bilateral TM visualized and normal in appearance (right TM chronic perforation), normal external auditory canal. MMM, no oral lesions, adequate dentition. Bilateral nares within normal limits. Throat without erythema, ulcerations or  exudates.  No cough on exam, no hoarseness on exam. Eyes:Pupils Equal Round Reactive to light, Extraocular movements intact,  Conjunctiva without redness, discharge or icterus. Neck/lymp/endocrine: Supple, no lymphadenopathy, no thyromegaly CV: RRR no murmur, no edema, +2/4 P posterior tibialis pulses. Chest: CTAB, no wheeze, rhonchi or crackles.  Normal respiratory effort.  Good air movement. Abd: Soft.  Obese. NTND. BS present.  No masses palpated. No hepatosplenomegaly. No rebound tenderness or guarding. Skin: No rashes or petechiae.  X1 purpura present left lower extremity.  Warm and well-perfused. Skin intact. Neuro/Msk:  Normal gait. PERLA. EOMi. Alert. Oriented x3.  Cranial nerves II through XII intact. Muscle strength 5/5 upper/lower extremity. DTRs equal bilaterally. Psych: Normal affect, dress and demeanor. Normal speech. Normal thought content and judgment.   No exam data present  Assessment/plan: [name] is a 74 y.o. female present for CPE Hyperlipidemia  -Patient has been statin intolerant.  Patient takes omega-3 and fenofibrate. Could consider Zetia if needed. 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  Elevated LFT CMP collected today.  AST elevated 1 month ago  Obesity (BMI 30-39.9) Diet and exercise recommended  Osteopenia of multiple sites/vitamin D deficiency Patient has started 1000 units of vitamin D since last visit. Recheck vitamin D at next appointment  Abdominal cramping/dark urine- Urinalysis w microscopic + reflex cultur  Covid counseling:  Instructed patient to discuss with her rheumatologist as well, since her concern is surrounding the effectiveness of vaccine while on rheumatological medications.  However, explained to her today that antibody testing for Covid is not recommended by  the FDA for proof of immunity after vaccination.  Antibody testing is positive in people who have been exposed to a Covid infection.  Her negative test only means that she has not been exposed to Covid infection.  Encounter for general adult medical examination with abnormal findings Patient was encouraged to exercise greater than 150 minutes a week. Patient was encouraged to choose a diet filled with fresh fruits and vegetables, and lean meats. AVS provided to patient today for education/recommendation on gender specific health and safety maintenance. Colonoscopy: completed 03/27/2019, by Dr. 03/29/2019 further colon cancer screenings needed. Mammogram: completed: 01/19/2019, normal.  Breast center in [place].  Patient desires every 2 years screening. Cervical cancer  screening:  N/A Immunizations: tdap 03/2015, Influenza 01/2019 (encouraged yearly), PNA series completed, shingrix completed, Covid series completed Infectious disease screening: Hep C completed DEXA: last completed 12/2017- normal  Return in about 4 months (around 02/14/2020).  Orders Placed This Encounter  Procedures  . Comprehensive metabolic panel  . Lipid panel  . Urinalysis w microscopic + reflex cultur   No orders of the defined types were placed in this encounter.  Referral Orders  No referral(s) requested today     Electronically signed by: Howard Pouch, [place]ronila

## 2019-10-27 ENCOUNTER — Telehealth: Payer: Self-pay | Admitting: Family Medicine

## 2019-10-27 DIAGNOSIS — R8290 Unspecified abnormal findings in urine: Secondary | ICD-10-CM

## 2019-10-27 LAB — COMPREHENSIVE METABOLIC PANEL
ALT: 20 U/L (ref 0–35)
AST: 29 U/L (ref 0–37)
Albumin: 4.3 g/dL (ref 3.5–5.2)
Alkaline Phosphatase: 36 U/L — ABNORMAL LOW (ref 39–117)
BUN: 21 mg/dL (ref 6–23)
CO2: 27 mEq/L (ref 19–32)
Calcium: 9.5 mg/dL (ref 8.4–10.5)
Chloride: 101 mEq/L (ref 96–112)
Creatinine, Ser: 0.79 mg/dL (ref 0.40–1.20)
GFR: 71.59 mL/min (ref >60.00)
Glucose, Bld: 167 mg/dL — ABNORMAL HIGH (ref 70–99)
Potassium: 4.6 mEq/L (ref 3.5–5.1)
Sodium: 137 mEq/L (ref 135–145)
Total Bilirubin: 0.6 mg/dL (ref 0.2–1.2)
Total Protein: 7.6 g/dL (ref 6.0–8.3)

## 2019-10-27 LAB — TIQ-MISC

## 2019-10-27 MED ORDER — PRAVASTATIN SODIUM 20 MG PO TABS
20.0000 mg | ORAL_TABLET | Freq: Every day | ORAL | 3 refills | Status: DC
Start: 2019-10-27 — End: 2020-05-16

## 2019-10-27 NOTE — Telephone Encounter (Signed)
Please inform patient the following information: Her liver enzyme has returned to normal Her triglycerides were still mildly elevated at 230-normal fasting for triglycerides is less than 150.  She is prescribed fenofibrate to help with her triglycerides.  She was reportedly intolerant to Lipitor in the past.  Please ask her if she was tried on any other types of statin medications such as pravastatin-this particular statin is usually tolerated better than other statins.  She would benefit from trying low-dose of this medication if she is willing.  Please advise.

## 2019-10-27 NOTE — Telephone Encounter (Signed)
Please see other notes in results- Pt will need to come and redo urine as it was sent in urine cup and needed to be sent another way.

## 2019-10-27 NOTE — Telephone Encounter (Signed)
Yes,  she would continue her fenofibrate and her omega-3/fish oil.  Pravastatin prescribed

## 2019-10-27 NOTE — Telephone Encounter (Signed)
Pt was called and given lab results, she verbalized understanding. She is willing to try medication. Pt would like to know if she should continue with Fenofibrate if starting Lipid med? Please advise    [facility]

## 2019-10-27 NOTE — Addendum Note (Signed)
Addended by: Felix Pacini A on: 10/27/2019 04:35 PM   Modules accepted: Orders

## 2019-10-30 NOTE — Telephone Encounter (Signed)
Sent to Dr Claiborne Billings as a FYI and to approve future lab. Please advise if you would the same tests ordered and I will call pt to come give another urine if needed.

## 2019-10-30 NOTE — Addendum Note (Signed)
Addended by: Felix Pacini A on: 10/30/2019 12:06 PM   Modules accepted: Orders

## 2019-10-30 NOTE — Addendum Note (Signed)
Addended by: Daryel November L on: 10/30/2019 08:55 AM   Modules accepted: Orders

## 2019-10-30 NOTE — Telephone Encounter (Signed)
Yes. She will need to have recollected. Please ask how she is feeling in concerns to her urination? If she is having worsening symptoms> I would elect to treat since her urine micro did appear infectious.  However, if her symptoms are not worsening, would rather wait until culture is completed to ensure it is treated appropriately dependent on bacteria growth/sensitivities.   Please advise.

## 2019-10-30 NOTE — Telephone Encounter (Signed)
Pt was called and given information. She states it is the same and not worse. She would rather wait to start the abx. Placed on lab schedule.

## 2019-10-31 ENCOUNTER — Other Ambulatory Visit: Payer: Self-pay

## 2019-10-31 ENCOUNTER — Ambulatory Visit (INDEPENDENT_AMBULATORY_CARE_PROVIDER_SITE_OTHER): Payer: PPO | Admitting: Family Medicine

## 2019-10-31 DIAGNOSIS — R8290 Unspecified abnormal findings in urine: Secondary | ICD-10-CM

## 2019-11-02 LAB — URINE CULTURE
MICRO NUMBER:: 10648635
SPECIMEN QUALITY:: ADEQUATE

## 2019-11-12 ENCOUNTER — Other Ambulatory Visit: Payer: Self-pay | Admitting: Physician Assistant

## 2019-11-13 NOTE — Telephone Encounter (Signed)
Last Visit: 10/17/2019 Next Visit: 11/29/2019 Labs: 09/11/2019 She has mild elevation of creatinine and AST.I advised her to reduce methotrexate to 0.7 mL subcu weekly.  Current Dose per office note on 10/17/2019: MTX 0.83ml sq once weekly  DX: Rheumatoid arthritis of multiple sites with negative rheumatoid factor   Okay to refill methotrexate?  (pharmacy is dispensing single use vials)

## 2019-11-17 LAB — URINALYSIS W MICROSCOPIC + REFLEX CULTURE
Bilirubin Urine: NEGATIVE
Glucose, UA: NEGATIVE
Hgb urine dipstick: NEGATIVE
Hyaline Cast: NONE SEEN /LPF
Ketones, ur: NEGATIVE
Nitrites, Initial: POSITIVE — AB
Protein, ur: NEGATIVE
Specific Gravity, Urine: 1.022 (ref 1.001–1.03)
pH: 5.5 (ref 5.0–8.0)

## 2019-11-17 LAB — CULTURE INDICATED

## 2019-11-17 LAB — URINE CULTURE

## 2019-11-22 ENCOUNTER — Ambulatory Visit: Payer: PPO | Admitting: Rheumatology

## 2019-11-29 ENCOUNTER — Encounter: Payer: Self-pay | Admitting: Rheumatology

## 2019-11-29 ENCOUNTER — Telehealth: Payer: Self-pay

## 2019-11-29 ENCOUNTER — Other Ambulatory Visit: Payer: Self-pay

## 2019-11-29 ENCOUNTER — Ambulatory Visit: Payer: PPO | Admitting: Physician Assistant

## 2019-11-29 VITALS — BP 107/71 | HR 83 | Resp 17 | Ht 64.0 in | Wt 208.8 lb

## 2019-11-29 DIAGNOSIS — M25432 Effusion, left wrist: Secondary | ICD-10-CM | POA: Diagnosis not present

## 2019-11-29 DIAGNOSIS — E1136 Type 2 diabetes mellitus with diabetic cataract: Secondary | ICD-10-CM

## 2019-11-29 DIAGNOSIS — I152 Hypertension secondary to endocrine disorders: Secondary | ICD-10-CM

## 2019-11-29 DIAGNOSIS — M19072 Primary osteoarthritis, left ankle and foot: Secondary | ICD-10-CM

## 2019-11-29 DIAGNOSIS — I10 Essential (primary) hypertension: Secondary | ICD-10-CM

## 2019-11-29 DIAGNOSIS — M19071 Primary osteoarthritis, right ankle and foot: Secondary | ICD-10-CM

## 2019-11-29 DIAGNOSIS — E118 Type 2 diabetes mellitus with unspecified complications: Secondary | ICD-10-CM

## 2019-11-29 DIAGNOSIS — M791 Myalgia, unspecified site: Secondary | ICD-10-CM | POA: Diagnosis not present

## 2019-11-29 DIAGNOSIS — M0609 Rheumatoid arthritis without rheumatoid factor, multiple sites: Secondary | ICD-10-CM

## 2019-11-29 DIAGNOSIS — E1169 Type 2 diabetes mellitus with other specified complication: Secondary | ICD-10-CM

## 2019-11-29 DIAGNOSIS — E1159 Type 2 diabetes mellitus with other circulatory complications: Secondary | ICD-10-CM

## 2019-11-29 DIAGNOSIS — R748 Abnormal levels of other serum enzymes: Secondary | ICD-10-CM | POA: Diagnosis not present

## 2019-11-29 DIAGNOSIS — Z79899 Other long term (current) drug therapy: Secondary | ICD-10-CM

## 2019-11-29 DIAGNOSIS — M8589 Other specified disorders of bone density and structure, multiple sites: Secondary | ICD-10-CM | POA: Diagnosis not present

## 2019-11-29 DIAGNOSIS — T466X5A Adverse effect of antihyperlipidemic and antiarteriosclerotic drugs, initial encounter: Secondary | ICD-10-CM

## 2019-11-29 DIAGNOSIS — E785 Hyperlipidemia, unspecified: Secondary | ICD-10-CM

## 2019-11-29 DIAGNOSIS — R5383 Other fatigue: Secondary | ICD-10-CM | POA: Diagnosis not present

## 2019-11-29 DIAGNOSIS — Z720 Tobacco use: Secondary | ICD-10-CM

## 2019-11-29 DIAGNOSIS — Z789 Other specified health status: Secondary | ICD-10-CM | POA: Diagnosis not present

## 2019-11-29 DIAGNOSIS — Z96653 Presence of artificial knee joint, bilateral: Secondary | ICD-10-CM

## 2019-11-29 DIAGNOSIS — H47013 Ischemic optic neuropathy, bilateral: Secondary | ICD-10-CM

## 2019-11-29 DIAGNOSIS — Z8709 Personal history of other diseases of the respiratory system: Secondary | ICD-10-CM

## 2019-11-29 MED ORDER — PREDNISONE 5 MG PO TABS
ORAL_TABLET | ORAL | 0 refills | Status: DC
Start: 2019-11-29 — End: 2020-01-11

## 2019-11-29 MED ORDER — TRIAMCINOLONE ACETONIDE 40 MG/ML IJ SUSP
30.0000 mg | INTRAMUSCULAR | Status: AC | PRN
  Administered 2019-11-29: 30 mg via INTRA_ARTICULAR

## 2019-11-29 MED ORDER — LIDOCAINE HCL 1 % IJ SOLN
1.0000 mL | INTRAMUSCULAR | Status: AC | PRN
  Administered 2019-11-29: 1 mL

## 2019-11-29 NOTE — Progress Notes (Signed)
Office Visit Note  Patient: [name]             Date of Birth: [birth year]           MRN: [id number]             PCP: Ma Hillock, DO Referring: Ma Hillock, DO Visit Date: 11/29/2019 Occupation: '@GUAROCC'$ @  Subjective:  Left wrist pain   History of Present Illness: [name] is a 74 y.o. female with history seronegative rheumatoid arthritis and osteoarthritis. She is on MTX 0.7 ml sq injections once weekly and folic acid 2 mg po daily, which was started in march 2021.  She continues to notice hair loss since starting on MTX.    She is currently having pain and inflammation in both hands, left wrist joint, and left ankle joint.  She presents today for a left wrist joint cortisone injection.   She has not had any recent infections.  She has received both covid-19 vaccinations.  She had her antibodies checked recently and they were negative.   Activities of Daily Living:  Patient reports morning stiffness for 1-2  hours.   Patient Denies nocturnal pain.  Difficulty dressing/grooming: Denies Difficulty climbing stairs: Denies Difficulty getting out of chair: Denies Difficulty using hands for taps, buttons, cutlery, and/or writing: Reports  Review of Systems  Constitutional: Positive for fatigue.  HENT: Negative for mouth sores, mouth dryness and nose dryness.   Eyes: Negative for pain, itching, visual disturbance and dryness.  Respiratory: Negative for cough, hemoptysis, shortness of breath and difficulty breathing.   Cardiovascular: Negative for chest pain, palpitations, hypertension and swelling in legs/feet.  Gastrointestinal: Negative for blood in stool, constipation and diarrhea.  Endocrine: Negative for increased urination.  Genitourinary: Negative for difficulty urinating and painful urination.  Musculoskeletal: Positive for arthralgias, joint pain, joint swelling and morning stiffness. Negative for myalgias, muscle weakness, muscle tenderness and myalgias.    Skin: Positive for hair loss (SE of MTX). Negative for color change, pallor, rash, nodules/bumps, redness, skin tightness, ulcers and sensitivity to sunlight.  Allergic/Immunologic: Negative for susceptible to infections.  Neurological: Positive for dizziness and weakness. Negative for numbness, headaches and memory loss.  Hematological: Negative for bruising/bleeding tendency and swollen glands.  Psychiatric/Behavioral: Negative for depressed mood, confusion and sleep disturbance. The patient is not nervous/anxious.     PMFS History:  Patient Active Problem List   Diagnosis Date Noted  . Schamberg's purpura 10/26/2019  . Thinning hair 10/13/2019  . Sleep disturbance 10/13/2019  . Vitamin D insufficiency 10/10/2019  . Rheumatoid arthritis of multiple sites with negative rheumatoid factor ([place]) 05/29/2019  . Myalgia due to statin 04/11/2019  . 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 Hypermetropia of both eyes 04/22/2018  . Hyperopia with presbyopia 04/22/2018  . Osteopenia 02/14/2016  . Cataract associated with type 2 diabetes mellitus ([place]) 02/14/2016  . Statin intolerance 08/05/2015  . Obesity (BMI 30-39.9) 08/03/2014  . Type 2 diabetes mellitus with complication, without long-term current use of insulin ([place]) 12/12/2013  . Hypertension associated with diabetes ([place]) 02/16/2012  . Occasional cigarette smoker 04/07/2010  . Hyperlipidemia associated with type 2 diabetes mellitus ([place]) 03/18/2010    Past Medical History:  Diagnosis Date  . Allergy   . Arthritis   . Chicken pox   . Colon polyp   . COPD (chronic obstructive pulmonary disease) ([place])   . CVA (cerebral infarction)   . Diabetes mellitus without complication ([place])   .  Diverticulitis   . History of blood transfusion   . Hyperlipidemia   . Insomnia   . Nasal polyps   . Obesity   . Sinusitis, chronic 03/18/2010   Qualifier: Diagnosis of  By: Johnnye Sima MD, Dellis Filbert      Family History  Problem  Relation Age of Onset  . Arthritis Mother   . Diabetes Mother        Type I  . Aneurysm Father   . Stroke Father   . Arthritis Sister   . Arthritis Sister   . Down syndrome Son   . Diabetes Son        type I   Past Surgical History:  Procedure Laterality Date  . ABDOMINAL HYSTERECTOMY  2000  . APPENDECTOMY    . CARDIAC CATHETERIZATION N/A 03/27/2015   Procedure: Left Heart Cath and Coronary Angiography;  Surgeon: Adrian Prows, MD;  Location: [facility];  Service: Cardiovascular;  Laterality: N/A;  . CESAREAN SECTION    . KNEE ARTHROPLASTY    . NASAL SINUS SURGERY    . REPLACEMENT TOTAL KNEE Bilateral 2007 2009  . TONSILLECTOMY AND ADENOIDECTOMY    . WISDOM TOOTH EXTRACTION     Social History   Social History Narrative   Marital status/children/pets: Married, 1 child.    Education/employment: Secretary/administrator educated, retired.   Safety:      -smoke alarm in the home:Yes     - wears seatbelt: Yes     - Feels safe in their relationships: Yes   Immunization History  Administered Date(s) Administered  . Fluad Quad(high Dose 65+) 01/11/2019  . Influenza Whole 04/21/2010  . Influenza, High Dose Seasonal PF 02/20/2013, 02/07/2018  . Influenza,inj,Quad PF,6+ Mos 02/23/2017  . Influenza-Unspecified 02/15/2014, 01/30/2016, 02/07/2018, 01/11/2019  . PFIZER SARS-COV-2 Vaccination 06/08/2019, 07/03/2019  . Pneumococcal Conjugate-13 02/20/2013  . Pneumococcal Polysaccharide-23 04/05/2006  . Pneumococcal-Unspecified 03/05/2015  . Tdap 09/14/2001, 03/05/2015  . Tetanus 03/27/2014  . Zoster 12/07/2013  . Zoster Recombinat (Shingrix) 10/12/2017, 02/07/2018     Objective: Vital Signs: BP 107/71 (BP Location: Left Arm, Patient Position: Sitting, Cuff Size: Normal)   Pulse 83   Resp 17   Ht _0  (1.626 m)   Wt (!) 208 lb 12.8 oz (94.7 kg)   BMI 35.84 kg/m    Physical Exam Vitals and nursing note reviewed.  Constitutional:      Appearance: She is well-developed.  HENT:      Head: Normocephalic and atraumatic.  Eyes:     Conjunctiva/sclera: Conjunctivae normal.  Pulmonary:     Effort: Pulmonary effort is normal.  Abdominal:     General: Bowel sounds are normal.     Palpations: Abdomen is soft.  Musculoskeletal:     Cervical back: Normal range of motion.  Lymphadenopathy:     Cervical: No cervical adenopathy.  Skin:    General: Skin is warm and dry.     Capillary Refill: Capillary refill takes less than 2 seconds.  Neurological:     Mental Status: She is alert and oriented to person, place, and time.  Psychiatric:        Behavior: Behavior normal.      Musculoskeletal Exam: C-spine, thoracic spine, and lumbar spine good ROM.  Shoulder joints and elbow joints good ROM with no discomfort.  Limited ROM of the left wrist joint.  Tenderness and inflammation of the left wrist joint.  PIP and DIP thickening consistent with osteoarthritis of both hands.  Bilateral knee replacements good ROM with  no discomfort.  Tenderness and inflammation of the left ankle joint.   CDAI Exam: CDAI Score: 3  Patient Global: 5 mm; Provider Global: 5 mm Swollen: 2 ; Tender: 2  Joint Exam 11/29/2019      Right  Left  Wrist     Swollen Tender  Ankle     Swollen Tender     Investigation: No additional findings.  Imaging: No results found.  Recent Labs: Lab Results  Component Value Date   WBC 5.2 09/11/2019   HGB 13.3 09/11/2019   PLT 213 09/11/2019   NA 137 10/26/2019   K 4.6 10/26/2019   CL 101 10/26/2019   CO2 27 10/26/2019   GLUCOSE 167 (H) 10/26/2019   BUN 21 10/26/2019   CREATININE 0.79 10/26/2019   BILITOT 0.6 10/26/2019   ALKPHOS 36 (L) 10/26/2019   AST 29 10/26/2019   ALT 20 10/26/2019   PROT 7.6 10/26/2019   ALBUMIN 4.3 10/26/2019   CALCIUM 9.5 10/26/2019   GFRAA 69 09/11/2019   QFTBGOLDPLUS NEGATIVE 06/28/2019    Speciality Comments: No specialty comments available.  Procedures:  Medium Joint Inj: L radiocarpal on 11/29/2019 3:44  PM Indications: pain and joint swelling Details: 27 G 1.5 in needle, ultrasound-guided dorsal approach Medications: 30 mg triamcinolone acetonide 40 MG/ML; 1 mL lidocaine 1 % Aspirate: 0 mL Outcome: tolerated well, no immediate complications Procedure, treatment alternatives, risks and benefits explained, specific risks discussed. Consent was given by the patient. Immediately prior to procedure a time out was called to verify the correct patient, procedure, equipment, support staff and site/side marked as required. Patient was prepped and draped in the usual sterile fashion.     Allergies: Ibuprofen and Lipitor [atorvastatin calcium]    Assessment / Plan:     Visit Diagnoses: Rheumatoid arthritis of multiple sites with negative rheumatoid factor ([place]): She has ongoing tenderness and inflammation in the left wrist joint and left ankle joint.  She presented today for a left radiocarpal ultrasound cortisone injection.  Procedure note completed above.  Her rheumatoid arthritis is inadequately controlled on methotrexate 0.7 mL subcutaneous injections once weekly as monotherpay.  She has been on methotrexate since March 2021 and has noticed very minimal improvement in her joint pain and stiffness.  We discussed the need for more aggressive immunosuppressive therapy.  Indications, contraindications, and potential side effects of Humira were discussed today.  We will apply for Humira 40 mg subcutaneous injections every 14 days.  All questions were addressed and consent was obtained today.   We will notify her once Humira has been approved and she will come to the office for the administration of the first injection.  She will continue on MTX as prescribed.  A prednisone taper starting at 20 mg tapering by 5 mg every 4 days was sent to the pharmacy.  She will follow-up in the office in 6 weeks to assess her response.   Counseled patient that Humira is a TNF blocking agent.  Counseled patient on purpose,  proper use, and adverse effects of Humira.  Reviewed the most common adverse effects including infections, headache, and injection site reactions. Discussed that there is the possibility of an increased risk of malignancy but it is not well understood if this increased risk is due to the medication or the disease state.  Advised patient to get yearly dermatology exams due to risk of skin cancer. Counseled patient that Humira should be held prior to scheduled surgery.  Counseled patient to avoid live vaccines while  on Humira.  Advised patient to get annual influenza vaccine and the pneumococcal vaccine as indicated.    Reviewed the importance of regular labs while on Humira therapy.  Standing orders placed.  Provided patient with medication education material and answered all questions.  Patient consented to Humira.  Will upload consent into the media tab.  Reviewed storage instructions of Humira.  Advised initial injection must be administered in office.  Patient verbalized understanding.  Dose will be for rheumatoid arthritis Humira 40 mg every 14 days.  Prescription pending lab results and/or insurance approval.  Swelling of joint of left wrist: She has tenderness and synovitis of the left wrist joint.  After informed consent the left radiocarpal joint was injected with cortisone under ultrasound guidance.  She tolerated the procedure well.  The procedure note was completed above.  We will be adding Humira on in combination with methotrexate as discussed above.  High risk medication use: Applying for Humira 40 mg subcutaneous injections once every 14 days.  Continuing on methotrexate 0.7 mL sq injections once weekly and folic acid 2 mg by mouth daily.  She has had all necessary baseline immunosuppressive lab work.  CMP was drawn on 10/26/2019.  CBC was drawn on 09/11/2019.  She will return for lab work in 1 month and every 3 months to monitor for drug toxicity.  TB gold negative on 06/28/2019. We discussed  the importance of holding methotrexate and Humira if she develops any signs or symptoms of an infection and to resume once the infection has completely cleared.  She received both COVID-19 vaccinations in March 2021 and had her antibodies checked recently which were negative.  We discussed the importance of wearing a mask, social distancing, and hand hygiene.  She was advised to contact her PCP or our office if she contracts the COVID-19 virus in order to receive the Covid antibody infusion.  Status post total bilateral knee replacement: Good ROM bilaterally.  She has been having some increased discomfort in the left knee replacement recently.    Primary osteoarthritis of both feet: She has tenderness and inflammation of the left ankle joint on exam.  PIP and DIP thickening consistent with osteoarthritis of both feet.  Discussed the importance of wearing proper fitting shoes.  Other fatigue: Chronic but stable.   Osteopenia of multiple sites: History of osteopenia. DEXA on 12/14/17: The BMD measured at Femur Neck Right is 0.971 g/cm2 with a T-score of -0.5. Repeat DEXA in 5 years.    Low serum alkaline phosphatase: Alk phos was low at 33 and has trended down to 30 on 08/11/19. Phosphorus WNL on 07/20/19.  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 Medium Joint Inj   Meds ordered this encounter  Medications  . predniSONE (DELTASONE) 5 MG tablet    Sig: Take 4 tablets by mouth daily x4 days, 3 tablets by mouth daily x4 days, 2 tablets by mouth daily x4 days, 1 tablet by mouth  daily x4 days.    Dispense:  40 tablet    Refill:  0    Face-to-face time spent with patient was 30  minutes. Greater than 50% of time was spent in counseling and coordination of care.  Follow-Up Instructions: Return in about 6 weeks (around 01/10/2020) for Rheumatoid arthritis, Osteoarthritis.   Ofilia Neas, PA-C  Note - This record has been created using Dragon software.  Chart creation errors have been sought, but may not always  have been located. Such creation errors do not reflect on  the standard of medical care.

## 2019-11-29 NOTE — Telephone Encounter (Signed)
Please apply for humira, per Sheppard Evens.   -provided patient with patient assistance application and she will compete her portion and return to the office with required documentation.  -pending PAP packet started and placed in file cabinet at my desk.

## 2019-11-29 NOTE — Telephone Encounter (Signed)
Submitted a Prior Authorization request to [facility] for HUMIRA via Cover My Meds. Will update once we receive a response.   [id number] - PA Case ID: [id number]

## 2019-11-29 NOTE — Patient Instructions (Addendum)
Standing Labs We placed an order today for your standing lab work.   Please have your standing labs drawn in 1 month then every 3 months   If possible, please have your labs drawn 2 weeks prior to your appointment so that the provider can discuss your results at your appointment.  We have open lab daily Monday through Thursday from 8:30-12:30 PM and 1:30-4:30 PM and Friday from 8:30-12:30 PM and 1:30-4:00 PM at the office of Dr. Bo Merino, [facility].   Please be advised, patients with office appointments requiring lab work will take precedents over walk-in lab work.  If possible, please come for your lab work on Monday and Friday afternoons, as you may experience shorter wait times. The office is located at [address], [place], , [place] [address] No appointment is necessary.   Labs are drawn by [facility]. Please bring your co-pay at the time of your lab draw.  You may receive a bill from [facility] for your lab work.  If you wish to have your labs drawn at another location, please call the office 24 hours in advance to send orders.  If you have any questions regarding directions or hours of operation,  please call [phone].   As a reminder, please drink plenty of water prior to coming for your lab work. Thanks!    Adalimumab Injection What is this medicine? ADALIMUMAB (a dal AYE mu mab) is used to treat rheumatoid and psoriatic arthritis. It is also used to treat ankylosing spondylitis, Crohn's disease, ulcerative colitis, plaque psoriasis, hidradenitis suppurativa, and uveitis. This medicine may be used for other purposes; ask your health care provider or pharmacist if you have questions. COMMON BRAND NAME(S): CYLTEZO, Humira What should I tell my health care provider before I take this medicine? They need to know if you have any of these conditions:  diabetes  heart disease  hepatitis B or history of hepatitis B infection  immune system  problems  infection or history of infections  multiple sclerosis  recently received or scheduled to receive a vaccine  scheduled to have surgery  tuberculosis, a positive skin test for tuberculosis or have recently been in close contact with someone who has tuberculosis  an unusual reaction to adalimumab, other medicines, mannitol, latex, rubber, foods, dyes, or preservatives  pregnant or trying to get pregnant  breast-feeding How should I use this medicine? This medicine is for injection under the skin. You will be taught how to prepare and give this medicine. Use exactly as directed. Take your medicine at regular intervals. Do not take your medicine more often than directed. A special MedGuide will be given to you by the pharmacist with each prescription and refill. Be sure to read this information carefully each time. It is important that you put your used needles and syringes in a special sharps container. Do not put them in a trash can. If you do not have a sharps container, call your pharmacist or healthcare provider to get one. Talk to your pediatrician regarding the use of this medicine in children. While this drug may be prescribed for children as young as 2 years for selected conditions, precautions do apply. The manufacturer of the medicine offers free information to patients and their health care partners. Call [phone] for more information. Overdosage: If you think you have taken too much of this medicine contact a poison control center or emergency room at once. NOTE: This medicine is only for you. Do not share this medicine  with others. What if I miss a dose? If you miss a dose, take it as soon as you can. If it is almost time for your next dose, take only that dose. Do not take double or extra doses. Give the next dose when your next scheduled dose is due. Call your doctor or health care professional if you are not sure how to handle a missed dose. What may interact  with this medicine? Do not take this medicine with any of the following medications:  abatacept  anakinra  etanercept  infliximab  live virus vaccines  rilonacept This medicine may also interact with the following medications:  vaccines This list may not describe all possible interactions. Give your health care provider a list of all the medicines, herbs, non-prescription drugs, or dietary supplements you use. Also tell them if you smoke, drink alcohol, or use illegal drugs. Some items may interact with your medicine. What should I watch for while using this medicine? Visit your doctor or health care professional for regular checks on your progress. Tell your doctor or healthcare professional if your symptoms do not start to get better or if they get worse. You will be tested for tuberculosis (TB) before you start this medicine. If your doctor prescribes any medicine for TB, you should start taking the TB medicine before starting this medicine. Make sure to finish the full course of TB medicine. Call your doctor or health care professional if you get a cold or other infection while receiving this medicine. Do not treat yourself. This medicine may decrease your body's ability to fight infection. Talk to your doctor about your risk of cancer. You may be more at risk for certain types of cancers if you take this medicine. What side effects may I notice from receiving this medicine? Side effects that you should report to your doctor or health care professional as soon as possible:  allergic reactions like skin rash, itching or hives, swelling of the face, lips, or tongue  breathing problems  changes in vision  chest pain  fever, chills, or any other sign of infection  numbness or tingling  red, scaly patches or raised bumps on the skin  swelling of the ankles  swollen lymph nodes in the neck, underarm, or groin areas  unexplained weight loss  unusual bleeding or  bruising  unusually weak or tired Side effects that usually do not require medical attention (report to your doctor or health care professional if they continue or are bothersome):  headache  nausea  redness, itching, swelling, or bruising at site where injected This list may not describe all possible side effects. Call your doctor for medical advice about side effects. You may report side effects to FDA at 1-800-FDA-1088. Where should I keep my medicine? Keep out of the reach of children. Store in the original container and in the refrigerator between 2 and 8 degrees C (36 and 46 degrees F). Do not freeze. The product may be stored in a cool carrier with an ice pack, if needed. Protect from light. Throw away any unused medicine after the expiration date. NOTE: This sheet is a summary. It may not cover all possible information. If you have questions about this medicine, talk to your doctor, pharmacist, or health care provider.  2020 Elsevier/Gold Standard (2018-02-07 13:22:46)   Vaccines You are taking a medication(s) that can suppress your immune system.  The following immunizations are recommended: . Flu annually . Covid-19  o Please continue to wear your  mask if you are on any medications that suppress your immune system o If you are on methotrexate, Cellcept (mycophenolate), Rinvoq, Harriette Ohara, and Olumiant - Hold medication for 1 week after each vaccine dose o If you are on Orencia Subcutaneous injections - Hold medication both one week prior to and one week after the first vaccine dose (only) o If you are on Orencia IV infusions - Time vaccine administration so that the first vaccination will occur four weeks after infusion . Pneumonia (Pneumovax 23 and Prevnar 13 spaced at least 1 year apart) . Shingrix (after age 61)   Please check with your PCP to make sure you are up to date.  Please continue to social distance, wear a mask, and follow standard precautions recommended by  the CDC.   If you do contract the covid-19 infection, please call our office or PCPs office to discuss covid antibody infusion.

## 2019-11-30 NOTE — Telephone Encounter (Signed)
Patient's test claim for 1 month supply is $1,750.63. Patient should move forward with patient assistance. Patient received application in office.

## 2019-11-30 NOTE — Telephone Encounter (Signed)
Received notification from [facility] regarding a prior authorization for HUMIRA. Authorization has been APPROVED from 11/29/19 to 11/28/20.   Phone # [phone] Fax # [phone]   Verlin Fester, PharmD, BCACP, CPP Clinical Specialty Pharmacist (Rheumatology and Pulmonology)  11/30/2019 8:18 AM

## 2019-12-01 ENCOUNTER — Telehealth: Payer: Self-pay | Admitting: Rheumatology

## 2019-12-01 NOTE — Telephone Encounter (Signed)
FYI Per patient, after reading all the information provided in regards to Humira, patient has decided not to proceed with taking it. Patient stated she took Prednisone today, and will continue with it until it is done. She will continue with MTX 1 time per week. If you need to speak with patient in regards to this, please call her on Monday. She will be out for the rest of the day.

## 2019-12-04 ENCOUNTER — Other Ambulatory Visit: Payer: Self-pay | Admitting: Physician Assistant

## 2019-12-04 NOTE — Telephone Encounter (Signed)
Last Visit: 10/17/2019 Next Visit: 01/11/2020 Labs: 09/11/2019 She has mild elevation of creatinine and AST.I advised her to reduce methotrexate to 0.7 mL subcu weekly.  Current Dose per office note on 10/17/2019: MTX 0.19ml sq once weekly  DX: Rheumatoid arthritis of multiple sites with negative rheumatoid factor   Okay to refill methotrexate?

## 2019-12-04 NOTE — Telephone Encounter (Signed)
Amber, please discuss Humira with the patient.  See previous message.

## 2019-12-05 NOTE — Telephone Encounter (Signed)
Returned patient call.  Discussed Humira concerns including the risk of multiple sclerosis and cancer.  Patient verbalized understanding.  Patient states she is having a hard time excepting that she will require medication long-term.  She also is nervous about the side effects and if it is worth to start the medication when the only thing that is bothering her is that her wrist and her ankle.  Advised that we are also concerned about potential other organ system involvement if left untreated or not well controlled.  Patient verbalized understanding.  Patient states that the information she was given makes sense but she is still not ready to start therapy at this time.  She has an appointment in September and is willing to revisit starting Humira then.  We will follow-up with patient at that visit.  All questions encouraged and answered.  Instructed patient to call with any further questions or concerns.   Verlin Fester, PharmD, [place], CPP Clinical Specialty Pharmacist (Rheumatology and Pulmonology)  12/05/2019 10:03 AM

## 2019-12-06 NOTE — Telephone Encounter (Signed)
Patient decided to not move forward with therapy.  Closing encounter.   Verlin Fester, PharmD, [place], CPP Clinical Specialty Pharmacist (Rheumatology and Pulmonology)  12/06/2019 8:56 AM

## 2019-12-21 ENCOUNTER — Telehealth: Payer: Self-pay | Admitting: Family Medicine

## 2019-12-21 DIAGNOSIS — M654 Radial styloid tenosynovitis [de Quervain]: Secondary | ICD-10-CM | POA: Diagnosis not present

## 2019-12-21 DIAGNOSIS — M25532 Pain in left wrist: Secondary | ICD-10-CM | POA: Diagnosis not present

## 2019-12-21 HISTORY — DX: Radial styloid tenosynovitis (de quervain): M65.4

## 2019-12-21 NOTE — Telephone Encounter (Signed)
Spoke with spouse he stated to call her back in the morning she gets off late in afternoons

## 2019-12-28 ENCOUNTER — Other Ambulatory Visit: Payer: Self-pay

## 2019-12-28 DIAGNOSIS — Z79899 Other long term (current) drug therapy: Secondary | ICD-10-CM

## 2019-12-29 LAB — COMPLETE METABOLIC PANEL WITH GFR
AG Ratio: 1.3 (calc) (ref 1.0–2.5)
ALT: 16 U/L (ref 6–29)
AST: 22 U/L (ref 10–35)
Albumin: 3.9 g/dL (ref 3.6–5.1)
Alkaline phosphatase (APISO): 29 U/L — ABNORMAL LOW (ref 37–153)
BUN: 23 mg/dL (ref 7–25)
CO2: 26 mmol/L (ref 20–32)
Calcium: 9.2 mg/dL (ref 8.6–10.4)
Chloride: 102 mmol/L (ref 98–110)
Creat: 0.83 mg/dL (ref 0.60–0.93)
GFR, Est African American: 82 mL/min/{1.73_m2} (ref >=60)
GFR, Est Non African American: 71 mL/min/{1.73_m2} (ref >=60)
Globulin: 3.1 g/dL (calc) (ref 1.9–3.7)
Glucose, Bld: 132 mg/dL — ABNORMAL HIGH (ref 65–99)
Potassium: 4.4 mmol/L (ref 3.5–5.3)
Sodium: 137 mmol/L (ref 135–146)
Total Bilirubin: 0.4 mg/dL (ref 0.2–1.2)
Total Protein: 7 g/dL (ref 6.1–8.1)

## 2019-12-29 LAB — CBC WITH DIFFERENTIAL/PLATELET
Absolute Monocytes: 296 cells/uL (ref 200–950)
Basophils Absolute: 31 cells/uL (ref 0–200)
Basophils Relative: 0.6 %
Eosinophils Absolute: 296 cells/uL (ref 15–500)
Eosinophils Relative: 5.8 %
HCT: 41.3 % (ref 35.0–45.0)
Hemoglobin: 13.7 g/dL (ref 11.7–15.5)
Lymphs Abs: 1877 cells/uL (ref 850–3900)
MCH: 30.4 pg (ref 27.0–33.0)
MCHC: 33.2 g/dL (ref 32.0–36.0)
MCV: 91.8 fL (ref 80.0–100.0)
MPV: 9.7 fL (ref 7.5–12.5)
Monocytes Relative: 5.8 %
Neutro Abs: 2601 cells/uL (ref 1500–7800)
Neutrophils Relative %: 51 %
Platelets: 285 10*3/uL (ref 140–400)
RBC: 4.5 10*6/uL (ref 3.80–5.10)
RDW: 14.5 % (ref 11.0–15.0)
Total Lymphocyte: 36.8 %
WBC: 5.1 10*3/uL (ref 3.8–10.8)

## 2019-12-29 NOTE — Progress Notes (Signed)
CBC is normal.  Glucose is mildly elevated, probably not a fasting sample.

## 2019-12-29 NOTE — Progress Notes (Signed)
Office Visit Note  Patient: [name]             Date of Birth: Nov 06, 1945           MRN: [id number]             PCP: Ma Hillock, DO Referring: Ma Hillock, DO Visit Date: 01/11/2020 Occupation: @GUAROCC @  Subjective:  Medication management.   History of Present Illness: [name] is a 74 y.o. female with history of rheumatoid arthritis and osteoarthritis.  She states she has been taking methotrexate 0.7 mL subcu weekly along with folic acid.  She has been off prednisone for a long time now.  She states that after her left wrist joint injection in July she did not have much relief.  She also had injection in her left [facility] by Dr. Charlynne Pander PA without much relief.  She states gradually the symptoms got better and she has been using a glove which has been helpful.  At the swelling in her left ankle joint has decreased.  She is not experiencing any discomfort in her left ankle joint.  Activities of Daily Living:  Patient reports morning stiffness for 0 minute.   Patient Denies nocturnal pain.  Difficulty dressing/grooming: Denies Difficulty climbing stairs: Denies Difficulty getting out of chair: Denies Difficulty using hands for taps, buttons, cutlery, and/or writing: Denies  Review of Systems  Constitutional: Positive for fatigue. Negative for night sweats, weight gain and weight loss.  HENT: Negative for mouth sores, trouble swallowing, trouble swallowing, mouth dryness and nose dryness.   Eyes: Negative for pain, redness, visual disturbance and dryness.  Respiratory: Negative for cough, shortness of breath and difficulty breathing.   Cardiovascular: Negative for chest pain, palpitations, hypertension, irregular heartbeat and swelling in legs/feet.  Gastrointestinal: Negative for blood in stool, constipation and diarrhea.  Endocrine: Negative for increased urination.  Genitourinary: Negative for vaginal dryness.  Musculoskeletal: Positive for arthralgias and  joint pain. Negative for joint swelling, myalgias, muscle weakness, morning stiffness, muscle tenderness and myalgias.  Skin: Negative for color change, rash, hair loss, skin tightness, ulcers and sensitivity to sunlight.  Allergic/Immunologic: Negative for susceptible to infections.  Neurological: Negative for dizziness, memory loss, night sweats and weakness.  Hematological: Negative for swollen glands.  Psychiatric/Behavioral: Negative for depressed mood and sleep disturbance. The patient is not nervous/anxious.     PMFS History:  Patient Active Problem List   Diagnosis Date Noted  . Schamberg's purpura 10/26/2019  . Thinning hair 10/13/2019  . Sleep disturbance 10/13/2019  . Vitamin D insufficiency 10/10/2019  . Rheumatoid arthritis of multiple sites with negative rheumatoid factor ([place]) 05/29/2019  . Myalgia due to statin 04/11/2019  . 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 Hypermetropia of both eyes 04/22/2018  . Hyperopia with presbyopia 04/22/2018  . Osteopenia 02/14/2016  . Cataract associated with type 2 diabetes mellitus ([place]) 02/14/2016  . Statin intolerance 08/05/2015  . Obesity (BMI 30-39.9) 08/03/2014  . Type 2 diabetes mellitus with complication, without long-term current use of insulin ([place]) 12/12/2013  . Hypertension associated with diabetes ([place]) 02/16/2012  . Occasional cigarette smoker 04/07/2010  . Hyperlipidemia associated with type 2 diabetes mellitus ([place]) 03/18/2010    Past Medical History:  Diagnosis Date  . Allergy   . Arthritis   . Chicken pox   . Colon polyp   . COPD (chronic obstructive pulmonary disease) ([place])   . CVA (cerebral infarction)   . Diabetes mellitus without complication ([place])   .  Diverticulitis   . History of blood transfusion   . Hyperlipidemia   . Insomnia   . Nasal polyps   . Obesity   . Sinusitis, chronic 03/18/2010   Qualifier: Diagnosis of  By: Johnnye Sima MD, Dellis Filbert      Family History  Problem  Relation Age of Onset  . Arthritis Mother   . Diabetes Mother        Type I  . Aneurysm Father   . Stroke Father   . Arthritis Sister   . Arthritis Sister   . Down syndrome Son   . Diabetes Son        type I   Past Surgical History:  Procedure Laterality Date  . ABDOMINAL HYSTERECTOMY  2000  . APPENDECTOMY    . CARDIAC CATHETERIZATION N/A 03/27/2015   Procedure: Left Heart Cath and Coronary Angiography;  Surgeon: Adrian Prows, MD;  Location: [facility];  Service: Cardiovascular;  Laterality: N/A;  . CESAREAN SECTION    . KNEE ARTHROPLASTY    . NASAL SINUS SURGERY    . REPLACEMENT TOTAL KNEE Bilateral 2007 2009  . TONSILLECTOMY AND ADENOIDECTOMY    . WISDOM TOOTH EXTRACTION     Social History   Social History Narrative   Marital status/children/pets: Married, 1 child.    Education/employment: Secretary/administrator educated, retired.   Safety:      -smoke alarm in the home:Yes     - wears seatbelt: Yes     - Feels safe in their relationships: Yes   Immunization History  Administered Date(s) Administered  . Fluad Quad(high Dose 65+) 01/11/2019  . Influenza Whole 04/21/2010  . Influenza, High Dose Seasonal PF 02/20/2013, 02/07/2018  . Influenza,inj,Quad PF,6+ Mos 02/23/2017  . Influenza-Unspecified 02/15/2014, 01/30/2016, 02/07/2018, 01/11/2019  . PFIZER SARS-COV-2 Vaccination 06/08/2019, 07/03/2019, 01/04/2020  . Pneumococcal Conjugate-13 02/20/2013  . Pneumococcal Polysaccharide-23 04/05/2006  . Pneumococcal-Unspecified 03/05/2015  . Tdap 09/14/2001, 03/05/2015  . Tetanus 03/27/2014  . Zoster 12/07/2013  . Zoster Recombinat (Shingrix) 10/12/2017, 02/07/2018     Objective: Vital Signs: Resp 15   Ht $R'5\' 4"'gx$  (1.626 m)   Wt 208 lb 9.6 oz (94.6 kg)   BMI 35.81 kg/m    Physical Exam Vitals and nursing note reviewed.  Constitutional:      Appearance: She is well-developed.  HENT:     Head: Normocephalic and atraumatic.  Eyes:     Conjunctiva/sclera: Conjunctivae normal.    Cardiovascular:     Rate and Rhythm: Normal rate and regular rhythm.     Heart sounds: Normal heart sounds.  Pulmonary:     Effort: Pulmonary effort is normal.     Breath sounds: Normal breath sounds.  Abdominal:     General: Bowel sounds are normal.     Palpations: Abdomen is soft.  Musculoskeletal:     Cervical back: Normal range of motion.  Lymphadenopathy:     Cervical: No cervical adenopathy.  Skin:    General: Skin is warm and dry.     Capillary Refill: Capillary refill takes less than 2 seconds.  Neurological:     Mental Status: She is alert and oriented to person, place, and time.  Psychiatric:        Behavior: Behavior normal.      Musculoskeletal Exam: C-spine thoracic and lumbar spine with good range of motion.  Shoulder joints, elbow joints, wrist joints with good range of motion.  She has mild PIP and DIP thickening with no synovitis.  Hip joints were in good  range of motion.  Her bilateral knee joints are replaced.  Her left ankle joint is thickened due to previous injury.  No synovitis over MTPs was noted.  CDAI Exam: CDAI Score: 0.4  Patient Global: 2 mm; Provider Global: 2 mm Swollen: 0 ; Tender: 0  Joint Exam 01/11/2020   No joint exam has been documented for this visit     Investigation: No additional findings.  Imaging: No results found.  Recent Labs: Lab Results  Component Value Date   WBC 5.1 12/28/2019   HGB 13.7 12/28/2019   PLT 285 12/28/2019   NA 137 12/28/2019   K 4.4 12/28/2019   CL 102 12/28/2019   CO2 26 12/28/2019   GLUCOSE 132 (H) 12/28/2019   BUN 23 12/28/2019   CREATININE 0.83 12/28/2019   BILITOT 0.4 12/28/2019   ALKPHOS 36 (L) 10/26/2019   AST 22 12/28/2019   ALT 16 12/28/2019   PROT 7.0 12/28/2019   ALBUMIN 4.3 10/26/2019   CALCIUM 9.2 12/28/2019   GFRAA 82 12/28/2019   QFTBGOLDPLUS NEGATIVE 06/28/2019    Speciality Comments: No specialty comments available.  Procedures:  No procedures performed Allergies:  Ibuprofen and Lipitor [atorvastatin calcium]   Assessment / Plan:     Visit Diagnoses: Rheumatoid arthritis of multiple sites with negative rheumatoid factor (HCC)-patient had no synovitis on my examination today.  She has been tolerating methotrexate well.  She has noticed improvement in her symptoms.  She denies any stiffness.  High risk medication use - methotrexate 0.7 mL sq injections once weekly and folic acid 2 mg by mouth daily.  Labs obtained on December 28, 2019 were normal.  She will need labs every 3 months to monitor for drug toxicity.  Swelling of joint of left wrist-I injected her wrist joint in July.  She also had a [facility] injection at orthopedics.  The swelling has completely resolved now.  Status post total bilateral knee replacement-her bilateral knee joints are replaced and has been doing well.  Primary osteoarthritis of both feet-proper fitting shoes were discussed.  She had no synovitis on examination.  Other fatigue-appears to be related to other medical problems.  Osteopenia of multiple sites - DEXA on 12/14/17: The BMD measured at Femur Neck Right is 0.971 g/cm2 with a T-score of -0.5. Repeat DEXA in 5 years.    Low serum alkaline phosphatase - Alk phos was low at 33 and has trended down to 30 on 08/11/19. Phosphorus WNL on 07/20/19.  Myalgia due to statin-she is not experiencing muscle discomfort currently.  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  She was advised to delay methotrexate by 1 week.  Use of mask, social distancing and hand hygiene was emphasized.  I also  discussed in case she develops a COVID-19 infection she may be eligible to receive monoclonal antibody infusion.  Orders: No orders of the defined types were placed in this encounter.  No orders of the defined types were placed in this encounter.    Follow-Up Instructions: Return in about 5 months (around 06/12/2020) for Rheumatoid arthritis, Osteoarthritis.   Pollyann Savoy, MD  Note - This record has been created using Animal nutritionist.  Chart  creation errors have been sought, but may not always  have been located. Such creation errors do not reflect on  the standard of medical care.

## 2020-01-04 ENCOUNTER — Other Ambulatory Visit: Payer: Self-pay | Admitting: Physician Assistant

## 2020-01-11 ENCOUNTER — Other Ambulatory Visit: Payer: Self-pay

## 2020-01-11 ENCOUNTER — Encounter: Payer: Self-pay | Admitting: Rheumatology

## 2020-01-11 ENCOUNTER — Ambulatory Visit: Payer: PPO | Admitting: Rheumatology

## 2020-01-11 VITALS — BP 113/75 | HR 82 | Resp 15 | Ht 64.0 in | Wt 208.6 lb

## 2020-01-11 DIAGNOSIS — Z96653 Presence of artificial knee joint, bilateral: Secondary | ICD-10-CM | POA: Diagnosis not present

## 2020-01-11 DIAGNOSIS — R5383 Other fatigue: Secondary | ICD-10-CM | POA: Diagnosis not present

## 2020-01-11 DIAGNOSIS — T466X5A Adverse effect of antihyperlipidemic and antiarteriosclerotic drugs, initial encounter: Secondary | ICD-10-CM

## 2020-01-11 DIAGNOSIS — M19072 Primary osteoarthritis, left ankle and foot: Secondary | ICD-10-CM

## 2020-01-11 DIAGNOSIS — Z789 Other specified health status: Secondary | ICD-10-CM

## 2020-01-11 DIAGNOSIS — M8589 Other specified disorders of bone density and structure, multiple sites: Secondary | ICD-10-CM | POA: Diagnosis not present

## 2020-01-11 DIAGNOSIS — I10 Essential (primary) hypertension: Secondary | ICD-10-CM

## 2020-01-11 DIAGNOSIS — E1169 Type 2 diabetes mellitus with other specified complication: Secondary | ICD-10-CM

## 2020-01-11 DIAGNOSIS — Z79899 Other long term (current) drug therapy: Secondary | ICD-10-CM

## 2020-01-11 DIAGNOSIS — M19071 Primary osteoarthritis, right ankle and foot: Secondary | ICD-10-CM

## 2020-01-11 DIAGNOSIS — E118 Type 2 diabetes mellitus with unspecified complications: Secondary | ICD-10-CM | POA: Diagnosis not present

## 2020-01-11 DIAGNOSIS — I152 Hypertension secondary to endocrine disorders: Secondary | ICD-10-CM

## 2020-01-11 DIAGNOSIS — Z8709 Personal history of other diseases of the respiratory system: Secondary | ICD-10-CM

## 2020-01-11 DIAGNOSIS — E1159 Type 2 diabetes mellitus with other circulatory complications: Secondary | ICD-10-CM

## 2020-01-11 DIAGNOSIS — E785 Hyperlipidemia, unspecified: Secondary | ICD-10-CM

## 2020-01-11 DIAGNOSIS — M0609 Rheumatoid arthritis without rheumatoid factor, multiple sites: Secondary | ICD-10-CM | POA: Diagnosis not present

## 2020-01-11 DIAGNOSIS — R748 Abnormal levels of other serum enzymes: Secondary | ICD-10-CM

## 2020-01-11 DIAGNOSIS — Z7189 Other specified counseling: Secondary | ICD-10-CM

## 2020-01-11 DIAGNOSIS — E1136 Type 2 diabetes mellitus with diabetic cataract: Secondary | ICD-10-CM

## 2020-01-11 DIAGNOSIS — M25432 Effusion, left wrist: Secondary | ICD-10-CM

## 2020-01-11 DIAGNOSIS — M791 Myalgia, unspecified site: Secondary | ICD-10-CM | POA: Diagnosis not present

## 2020-01-11 DIAGNOSIS — Z720 Tobacco use: Secondary | ICD-10-CM

## 2020-01-11 DIAGNOSIS — H47013 Ischemic optic neuropathy, bilateral: Secondary | ICD-10-CM

## 2020-01-11 NOTE — Patient Instructions (Addendum)
COVID-19 vaccine recommendations:   COVID-19 vaccine is recommended for everyone (unless you are allergic to a vaccine component), even if you are on a medication that suppresses your immune system.   If you are on Methotrexate, Cellcept (mycophenolate), Rinvoq, Harriette Ohara, and Olumiant- hold the medication for 1 week after each vaccine. Hold Methotrexate for 2 weeks after the single dose COVID-19 vaccine.   If you are on Orencia subcutaneous injection - hold medication one week prior to and one week after the first COVID-19 vaccine dose (only).   If you are on Orencia IV infusions- time vaccination administration so that the first COVID-19 vaccination will occur four weeks after the infusion and postpone the subsequent infusion by one week.   If you are on Cyclophosphamide or Rituxan infusions please contact your doctor prior to receiving the COVID-19 vaccine.   Do not take Tylenol or any anti-inflammatory medications (NSAIDs) 24 hours prior to the COVID-19 vaccination.   There is no direct evidence about the efficacy of the COVID-19 vaccine in individuals who are on medications that suppress the immune system.   Even if you are fully vaccinated, and you are on any medications that suppress your immune system, please continue to wear a mask, maintain at least six feet social distance and practice hand hygiene.   If you develop a COVID-19 infection, please contact your PCP or our office to determine if you need antibody infusion.  The booster vaccine is now available for immunocompromised patients. It is advised that if you had Pfizer vaccine you should get [employer] booster.  If you had a Moderna vaccine then you should get a Moderna booster. Johnson and Laural Benes does not have a booster vaccine at this time.  Please see the following web sites for updated information.    [email or web address]://[email or web address]/[email or web address]-Vaccination-Patient-Resources.[email or web address]  [email or web address] Standing Labs We placed an order today for your standing lab work.   Please have your standing labs drawn in November and every months  If possible, please have your labs drawn 2 weeks prior to your appointment so that the provider can discuss your results at your appointment.  We have open lab daily Monday through Thursday from 8:30-12:30 PM and 1:30-4:30 PM and Friday from 8:30-12:30 PM and 1:30-4:00 PM at the office of Dr. Pollyann Savoy, [facility].   Please be advised, patients with office appointments requiring lab work will take precedents over walk-in lab work.  If possible, please come for your lab work on Monday and Friday afternoons, as you may experience shorter wait times. The office is located at [address], [address], [place], [place] [address] No appointment is necessary.   Labs are drawn by [facility]. Please bring your co-pay at the time of your lab draw.  You may receive a bill from [facility] for your lab work.  If you wish to have your labs drawn at another location, please call the office 24 hours in advance to send orders.  If you have any questions regarding directions or hours of operation,  please call [phone].   As a reminder, please drink plenty of water prior to coming for your lab work. Thanks!

## 2020-02-08 ENCOUNTER — Telehealth: Payer: Self-pay | Admitting: Family Medicine

## 2020-02-08 NOTE — Telephone Encounter (Signed)
Left message for patient to schedule Annual Wellness Visit.  Please schedule with Nurse Health Advisor Martha Stanley, RN at [place]  °

## 2020-02-15 ENCOUNTER — Other Ambulatory Visit: Payer: Self-pay

## 2020-02-15 ENCOUNTER — Encounter: Payer: Self-pay | Admitting: Family Medicine

## 2020-02-15 ENCOUNTER — Ambulatory Visit (INDEPENDENT_AMBULATORY_CARE_PROVIDER_SITE_OTHER): Payer: PPO | Admitting: Family Medicine

## 2020-02-15 VITALS — BP 126/77 | HR 86 | Temp 98.7°F | Ht 64.0 in | Wt 207.0 lb

## 2020-02-15 DIAGNOSIS — I152 Hypertension secondary to endocrine disorders: Secondary | ICD-10-CM

## 2020-02-15 DIAGNOSIS — E669 Obesity, unspecified: Secondary | ICD-10-CM | POA: Diagnosis not present

## 2020-02-15 DIAGNOSIS — Z23 Encounter for immunization: Secondary | ICD-10-CM

## 2020-02-15 DIAGNOSIS — E118 Type 2 diabetes mellitus with unspecified complications: Secondary | ICD-10-CM | POA: Diagnosis not present

## 2020-02-15 DIAGNOSIS — M8589 Other specified disorders of bone density and structure, multiple sites: Secondary | ICD-10-CM

## 2020-02-15 DIAGNOSIS — E559 Vitamin D deficiency, unspecified: Secondary | ICD-10-CM

## 2020-02-15 DIAGNOSIS — E1159 Type 2 diabetes mellitus with other circulatory complications: Secondary | ICD-10-CM | POA: Diagnosis not present

## 2020-02-15 DIAGNOSIS — E1169 Type 2 diabetes mellitus with other specified complication: Secondary | ICD-10-CM | POA: Diagnosis not present

## 2020-02-15 DIAGNOSIS — E785 Hyperlipidemia, unspecified: Secondary | ICD-10-CM

## 2020-02-15 LAB — LIPID PANEL
Cholesterol: 172 mg/dL (ref 0–200)
HDL: 46.3 mg/dL (ref >39.00)
LDL Cholesterol: 92 mg/dL (ref 0–99)
NonHDL: 125.74
Total CHOL/HDL Ratio: 4
Triglycerides: 167 mg/dL — ABNORMAL HIGH (ref 0.0–149.0)
VLDL: 33.4 mg/dL (ref 0.0–40.0)

## 2020-02-15 LAB — COMPREHENSIVE METABOLIC PANEL
ALT: 17 U/L (ref 0–35)
AST: 25 U/L (ref 0–37)
Albumin: 4.3 g/dL (ref 3.5–5.2)
Alkaline Phosphatase: 31 U/L — ABNORMAL LOW (ref 39–117)
BUN: 20 mg/dL (ref 6–23)
CO2: 27 mEq/L (ref 19–32)
Calcium: 9.1 mg/dL (ref 8.4–10.5)
Chloride: 102 mEq/L (ref 96–112)
Creatinine, Ser: 0.81 mg/dL (ref 0.40–1.20)
GFR: 72.53 mL/min (ref >60.00)
Glucose, Bld: 140 mg/dL — ABNORMAL HIGH (ref 70–99)
Potassium: 4.3 mEq/L (ref 3.5–5.1)
Sodium: 137 mEq/L (ref 135–145)
Total Bilirubin: 0.6 mg/dL (ref 0.2–1.2)
Total Protein: 7.4 g/dL (ref 6.0–8.3)

## 2020-02-15 LAB — HEMOGLOBIN A1C: Hgb A1c MFr Bld: 7.6 % — ABNORMAL HIGH (ref 4.6–6.5)

## 2020-02-15 LAB — VITAMIN D 25 HYDROXY (VIT D DEFICIENCY, FRACTURES): VITD: 36.44 ng/mL (ref 30.00–100.00)

## 2020-02-15 MED ORDER — LOSARTAN POTASSIUM 25 MG PO TABS: 25.0000 mg | ORAL_TABLET | Freq: Every day | ORAL | 1 refills | Status: DC

## 2020-02-15 MED ORDER — FENOFIBRATE 160 MG PO TABS
160.0000 mg | ORAL_TABLET | Freq: Every day | ORAL | 1 refills | Status: DC
Start: 2020-02-15 — End: 2020-10-09

## 2020-02-15 NOTE — Progress Notes (Signed)
This visit occurred during the SARS-CoV-2 public health emergency.  Safety protocols were in place, including screening questions prior to the visit, additional usage of staff PPE, and extensive cleaning of exam room while observing appropriate contact time as indicated for disinfecting solutions.    Patient ID: [name], female  DOB: 10/12/1945, 74 y.o.   MRN: [id number] Patient Care Team    Relationship Specialty Notifications Start End  Natalia Leatherwood, DO PCP - General Family Medicine  10/10/19   Pollyann Savoy, MD Consulting Physician Rheumatology  10/10/19   Charna Elizabeth, MD Consulting Physician Gastroenterology  10/10/19   Renae Fickle, OD  Optometry  10/10/19     Chief Complaint  Patient presents with  . Follow-up    [facility];     [name] is a 74 y.o.  Female  present for [facility] Hypertension associated with diabetes (HCC)/obesity/statin intolerance/hyperlipidemia Pt reports compliance  with losartan 25 mg daily, start of pravastatin and fenofibrate. Blood pressures ranges at home limits. Patient denies chest pain, shortness of breath, dizziness or lower extremity edema.  Pt takes a daily baby ASA.      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  Patient is prescribed methotrexate and folate.  Type 2 diabetes mellitus with unspecified complications (HCC)/cataracts associated with type 2 diabetes Pt reports compliance with metformin 500 mg twice daily.  She reports she was diagnosed with diabetes about 20 years ago. Denies numbness, tingling of extremities, hypo/hyperglycemic events or non-healing wounds. She has had steroid injections at the end of July. 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  She is managed closely by her  ophthalmology team Dr. Antionette Char A1c: Last A1c 7.7> A1c 7.4> collected  today.  Thinning hair/hair loss/vitamin D deficiency Patient reports she has always had thin hair but she does feel like her hair has been thinning even more over the last few months. She is tolerating supplement 1000 un it daily.   Sleep disturbance Patient reports she is prescribed Ambien 5 mg nightly as needed.  She very rarely takes this medication.  When she does she takes about a third of a tab.  She reports she still has medications that she filled last June.  She does not need refills on this today  Depression screen [facility] 2/9 02/15/2020 10/26/2019 10/18/2018 02/14/2018 10/20/2016  Decreased Interest 0 0 - 0 0  Down, Depressed, Hopeless 0 0 0 0 0  PHQ - 2 Score 0 0 0 0 0   No flowsheet data found.   Immunization History  Administered Date(s) Administered  . Fluad Quad(high Dose 65+) 01/11/2019, 02/15/2020  . Influenza Whole 04/21/2010  . Influenza, High Dose Seasonal PF 02/20/2013, 02/07/2018  . Influenza,inj,Quad PF,6+ Mos 02/23/2017  . Influenza-Unspecified 02/15/2014, 01/30/2016, 02/07/2018, 01/11/2019  . PFIZER SARS-COV-2 Vaccination 06/08/2019, 07/03/2019, 01/04/2020  . Pneumococcal Conjugate-13 02/20/2013  . Pneumococcal Polysaccharide-23 04/05/2006  . Pneumococcal-Unspecified 03/05/2015  . Tdap 09/14/2001, 03/05/2015  . Tetanus 03/27/2014  . Zoster 12/07/2013  . Zoster Recombinat (Shingrix) 10/12/2017, 02/07/2018     Past Medical History:  Diagnosis Date  . Allergy   . Arthritis   . Chicken pox   . Colon polyp   . COPD (chronic obstructive pulmonary disease) (HCC)   . CVA (cerebral infarction)   . Diabetes mellitus without complication (HCC)   . Diverticulitis   .  History of blood transfusion   . Hyperlipidemia   . Insomnia   . Nasal polyps   . Obesity   . Sinusitis, chronic 03/18/2010   Qualifier: Diagnosis of  By: Ninetta Lights MD, Tinnie Gens     Allergies  Allergen Reactions  . Lipitor  [Atorvastatin Calcium]    Past Surgical History:  Procedure Laterality Date  . ABDOMINAL HYSTERECTOMY  2000  . APPENDECTOMY    . CARDIAC CATHETERIZATION N/A 03/27/2015   Procedure: Left Heart Cath and Coronary Angiography;  Surgeon: Yates Decamp, MD;  Location: [facility] INVASIVE CV LAB;  Service: Cardiovascular;  Laterality: N/A;  . CESAREAN SECTION    . KNEE ARTHROPLASTY    . NASAL SINUS SURGERY    . REPLACEMENT TOTAL KNEE Bilateral 2007 2009  . TONSILLECTOMY AND ADENOIDECTOMY    . WISDOM TOOTH EXTRACTION     Family History  Problem Relation Age of Onset  . Arthritis Mother   . Diabetes Mother        Type I  . Aneurysm Father   . Stroke Father   . Arthritis Sister   . Arthritis Sister   . Down syndrome Son   . Diabetes Son        type I   Social History   Social History Narrative   Marital status/children/pets: Married, 1 child.    Education/employment: Automotive engineer educated, retired.   Safety:      -smoke alarm in the home:Yes     - wears seatbelt: Yes     - Feels safe in their relationships: Yes    Allergies as of 02/15/2020      Reactions   Lipitor [atorvastatin Calcium]       Medication List       Accurate as of February 15, 2020 10:27 AM. If you have any questions, ask your nurse or doctor.        aspirin EC 81 MG tablet Take 81 mg by mouth daily.   cholecalciferol 25 MCG (1000 UNIT) tablet Commonly known as: VITAMIN D3 Take 1,000 Units by mouth daily.   fenofibrate 160 MG tablet Take 1 tablet (160 mg total) by mouth daily.   folic acid 1 MG tablet Commonly known as: FOLVITE Take 2 tablets (2 mg total) by mouth daily.   losartan 25 MG tablet Commonly known as: COZAAR Take 1 tablet (25 mg total) by mouth daily.   MECLIZINE HCL PO Take by mouth as needed.   metFORMIN 500 MG tablet Commonly known as: GLUCOPHAGE Take 1 tablet (500 mg total) by mouth 2 (two) times daily.   methotrexate (PF) 50 MG/2ML injection INJECT 0.7 MLS (17.5 MG TOTAL) INTO THE SKIN  ONCE A WEEK. *SINGLE USE VIAL*   multivitamin with minerals tablet Take 1 tablet by mouth daily.   OMEGA 3 PO Take by mouth.   pravastatin 20 MG tablet Commonly known as: PRAVACHOL Take 1 tablet (20 mg total) by mouth daily.   TUBERCULIN SYR 1CC/27GX1/2" 27G X 1/2" 1 ML Misc Commonly known as: B-D TB SYRINGE 1CC/27GX1/2" 12 Syringes by Does not apply route once a week.   zolpidem 5 MG tablet Commonly known as: AMBIEN Take 5 mg by mouth at bedtime as needed for sleep.       All past medical history, surgical history, allergies, family history, immunizations andmedications were updated in the EMR today and reviewed under the history and medication portions of their EMR.      ROS: 14 pt review of systems performed and negative (unless  mentioned in an HPI)  Objective: BP 126/77   Pulse 86   Temp 98.7 F (37.1 C) (Oral)   Ht 5\' 4"  (1.626 m)   Wt 207 lb (93.9 kg)   SpO2 96%   BMI 35.53 kg/m  Gen: Afebrile. No acute distress.  HENT: AT. [place]. MMM Eyes:Pupils Equal Round Reactive to light, Extraocular movements intact,  Conjunctiva without redness, discharge or icterus. CV: RRR no murmur, no edema, +2/4 P posterior tibialis pulses Chest: CTAB, no wheeze or crackles Skin: no rashes, purpura or petechiae.  Neuro: Normal gait. PERLA. EOMi. Alert. Oriented. x3  Psych: Normal affect, dress and demeanor. Normal speech. Normal thought content and judgment.   No exam data present  Assessment/plan: [name] is a 74 y.o. female present for  Hypertension/Hyperlipidemia  - stable - continue losartan 25 mg qd - started pravastatin 3 mos ago> continue - continue omega-3 and fenofibrate. - Comprehensive metabolic panel - Lipid panel   Osteopenia of multiple sites/vitamin D deficiency Patient has started 1000 units of vitamin D since last visit. Recheck vitamin D today> OTC 1000.   diabetes (HCC)/obesity Continue Metformin 500 mg twice daily for now> had SE to higher  dose. If a1c still elevated may consider add on med (avoid invokana) Continue routine exercise and low-sodium diet. 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  She is managed closely by her ophthalmology team Dr. 07/2019 Follow-up in 4 months with other chronic conditions.  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  Thinning hair/hair loss/vitamin D deficiency Patient is on methotrexate reports she is compliant with folate.  Will check vitamin D levels and iron levels today to rule out as potential cause. - Vitamin D recheck today  Influenza vaccine administered today  Return in about 3 months (around 05/29/2020) for [facility] (30 min).  Orders Placed This Encounter  Procedures  . Flu Vaccine QUAD High Dose(Fluad)  . Lipid panel  . Comprehensive metabolic panel  . Hemoglobin A1c  . Vitamin D (25 hydroxy)   Meds ordered this encounter  Medications  . fenofibrate 160 MG tablet    Sig: Take 1 tablet (160 mg total) by mouth daily.    Dispense:  90 tablet    Refill:  1  . losartan (COZAAR) 25 MG tablet    Sig: Take 1 tablet (25 mg total) by mouth daily.    Dispense:  90 tablet    Refill:  1   Referral Orders  No referral(s) requested today     Electronically signed by: 05/31/2020, DO [facility]

## 2020-02-15 NOTE — Patient Instructions (Signed)
Great to see you today.  We will call you with lab results and guidance on Diabetes medication.  We will see you in January.     Diabetes Mellitus and Nutrition, Adult When you have diabetes (diabetes mellitus), it is very important to have healthy eating habits because your blood sugar (glucose) levels are greatly affected by what you eat and drink. Eating healthy foods in the appropriate amounts, at about the same times every day, can help you:  Control your blood glucose.  Lower your risk of heart disease.  Improve your blood pressure.  Reach or maintain a healthy weight. Every person with diabetes is different, and each person has different needs for a meal plan. Your health care provider may recommend that you work with a diet and nutrition specialist (dietitian) to make a meal plan that is best for you. Your meal plan may vary depending on factors such as:  The calories you need.  The medicines you take.  Your weight.  Your blood glucose, blood pressure, and cholesterol levels.  Your activity level.  Other health conditions you have, such as heart or kidney disease. How do carbohydrates affect me? Carbohydrates, also called carbs, affect your blood glucose level more than any other type of food. Eating carbs naturally raises the amount of glucose in your blood. Carb counting is a method for keeping track of how many carbs you eat. Counting carbs is important to keep your blood glucose at a healthy level, especially if you use insulin or take certain oral diabetes medicines. It is important to know how many carbs you can safely have in each meal. This is different for every person. Your dietitian can help you calculate how many carbs you should have at each meal and for each snack. Foods that contain carbs include:  Bread, cereal, rice, pasta, and crackers.  Potatoes and corn.  Peas, beans, and lentils.  Milk and yogurt.  Fruit and juice.  Desserts, such as cakes,  cookies, ice cream, and candy. How does alcohol affect me? Alcohol can cause a sudden decrease in blood glucose (hypoglycemia), especially if you use insulin or take certain oral diabetes medicines. Hypoglycemia can be a life-threatening condition. Symptoms of hypoglycemia (sleepiness, dizziness, and confusion) are similar to symptoms of having too much alcohol. If your health care provider says that alcohol is safe for you, follow these guidelines:  Limit alcohol intake to no more than 1 drink per day for nonpregnant women and 2 drinks per day for men. One drink equals 12 oz of beer, 5 oz of wine, or 1 oz of hard liquor.  Do not drink on an empty stomach.  Keep yourself hydrated with water, diet soda, or unsweetened iced tea.  Keep in mind that regular soda, juice, and other mixers may contain a lot of sugar and must be counted as carbs. What are tips for following this plan?  Reading food labels  Start by checking the serving size on the "Nutrition Facts" label of packaged foods and drinks. The amount of calories, carbs, fats, and other nutrients listed on the label is based on one serving of the item. Many items contain more than one serving per package.  Check the total grams (g) of carbs in one serving. You can calculate the number of servings of carbs in one serving by dividing the total carbs by 15. For example, if a food has 30 g of total carbs, it would be equal to 2 servings of carbs.  Check  the number of grams (g) of saturated and trans fats in one serving. Choose foods that have low or no amount of these fats.  Check the number of milligrams (mg) of salt (sodium) in one serving. Most people should limit total sodium intake to less than 2,300 mg per day.  Always check the nutrition information of foods labeled as "low-fat" or "nonfat". These foods may be higher in added sugar or refined carbs and should be avoided.  Talk to your dietitian to identify your daily goals for  nutrients listed on the label. Shopping  Avoid buying canned, premade, or processed foods. These foods tend to be high in fat, sodium, and added sugar.  Shop around the outside edge of the grocery store. This includes fresh fruits and vegetables, bulk grains, fresh meats, and fresh dairy. Cooking  Use low-heat cooking methods, such as baking, instead of high-heat cooking methods like deep frying.  Cook using healthy oils, such as olive, canola, or sunflower oil.  Avoid cooking with butter, cream, or high-fat meats. Meal planning  Eat meals and snacks regularly, preferably at the same times every day. Avoid going long periods of time without eating.  Eat foods high in fiber, such as fresh fruits, vegetables, beans, and whole grains. Talk to your dietitian about how many servings of carbs you can eat at each meal.  Eat 4-6 ounces (oz) of lean protein each day, such as lean meat, chicken, fish, eggs, or tofu. One oz of lean protein is equal to: ? 1 oz of meat, chicken, or fish. ? 1 egg. ?  cup of tofu.  Eat some foods each day that contain healthy fats, such as avocado, nuts, seeds, and fish. Lifestyle  Check your blood glucose regularly.  Exercise regularly as told by your health care provider. This may include: ? 150 minutes of moderate-intensity or vigorous-intensity exercise each week. This could be brisk walking, biking, or water aerobics. ? Stretching and doing strength exercises, such as yoga or weightlifting, at least 2 times a week.  Take medicines as told by your health care provider.  Do not use any products that contain nicotine or tobacco, such as cigarettes and e-cigarettes. If you need help quitting, ask your health care provider.  Work with a Veterinary surgeon or diabetes educator to identify strategies to manage stress and any emotional and social challenges. Questions to ask a health care provider  Do I need to meet with a diabetes educator?  Do I need to meet with a  dietitian?  What number can I call if I have questions?  When are the best times to check my blood glucose? Where to find more information:  American Diabetes Association: diabetes.org  Academy of Nutrition and Dietetics: www.eatright.[employer] of Diabetes and Digestive and Kidney Diseases (NIH): CarFlippers.tn Summary  A healthy meal plan will help you control your blood glucose and maintain a healthy lifestyle.  Working with a diet and nutrition specialist (dietitian) can help you make a meal plan that is best for you.  Keep in mind that carbohydrates (carbs) and alcohol have immediate effects on your blood glucose levels. It is important to count carbs and to use alcohol carefully. This information is not intended to replace advice given to you by your health care provider. Make sure you discuss any questions you have with your health care provider. Document Revised: 04/02/2017 Document Reviewed: 05/25/2016 Elsevier Patient Education  2020 [employer].

## 2020-02-16 ENCOUNTER — Telehealth: Payer: Self-pay

## 2020-02-16 ENCOUNTER — Telehealth: Payer: Self-pay | Admitting: Family Medicine

## 2020-02-16 DIAGNOSIS — E118 Type 2 diabetes mellitus with unspecified complications: Secondary | ICD-10-CM

## 2020-02-16 MED ORDER — METFORMIN HCL 500 MG PO TABS: 500.0000 mg | ORAL_TABLET | Freq: Two times a day (BID) | ORAL | 1 refills | Status: DC

## 2020-02-16 MED ORDER — SITAGLIPTIN PHOSPHATE 50 MG PO TABS: 50.0000 mg | ORAL_TABLET | Freq: Every day | ORAL | 1 refills | Status: DC

## 2020-02-16 NOTE — Telephone Encounter (Signed)
[employer] called stating prior authorization is required for Methotrexate.  Please call back at #[phone]   Reference #[id number]

## 2020-02-16 NOTE — Telephone Encounter (Signed)
Please inform patient the following information: Her electrolytes, liver and kidney function are stable. Cholesterol panel looks good and is improved with a total cholesterol of 172, HDL 46, LDL 92 and triglycerides of 167. Vitamin D levels are normal at 36-continue current dose For her increased A1c, I have called in a refill on her Metformin at current dose and added a medication called Januvia (which is on her formulary).   I have printed out prescriptions  to send to [facility] since E scribing is not working properly.

## 2020-02-16 NOTE — Telephone Encounter (Signed)
Spoke with pt regarding labs and instructions. Rx faxed 

## 2020-02-16 NOTE — Telephone Encounter (Signed)
Submitted a Prior Authorization request to [employer] for Methotrexate via Cover My Meds. Will update once we receive a response. ° ° ° ° ° °

## 2020-02-20 NOTE — Telephone Encounter (Signed)
Received notification from [facility] regarding a prior authorization for Methotrexate. Authorization has been APPROVED from 02/17/2020 to 02/16/2120.

## 2020-02-21 ENCOUNTER — Ambulatory Visit (INDEPENDENT_AMBULATORY_CARE_PROVIDER_SITE_OTHER): Payer: PPO

## 2020-02-21 VITALS — Ht 64.0 in | Wt 207.0 lb

## 2020-02-21 DIAGNOSIS — Z0000 Encounter for general adult medical examination without abnormal findings: Secondary | ICD-10-CM

## 2020-02-21 NOTE — Patient Instructions (Signed)
[name] , Thank you for taking time to complete your Medicare Wellness Visit. I appreciate your ongoing commitment to your health goals. Please review the following plan we discussed and let me know if I can assist you in the future.   Screening recommendations/referrals: Colonoscopy: Completed 03/27/2019- Not required after age 72. Mammogram: Due- Per our conversation, you will call to schedule. Bone Density: Completed 12/14/2017-Normal-Discuss with Dr. Claiborne Billings to see when next test is due. Recommended yearly ophthalmology/optometry visit for glaucoma screening and checkup Recommended yearly dental visit for hygiene and checkup  Vaccinations: Influenza vaccine: Up to Date Pneumococcal vaccine: Completed vaccines Tdap vaccine: up to Date- Due-03/04/2025 Shingles vaccine: Completed vaccines  Covid-19:Completed vaccines  Advanced directives: Copy in chart  Conditions/risks identified: See problem list  Next appointment: Follow up in one year for your annual wellness visit    Preventive Care 65 Years and Older, Female Preventive care refers to lifestyle choices and visits with your health care provider that can promote health and wellness. What does preventive care include?  A yearly physical exam. This is also called an annual well check.  Dental exams once or twice a year.  Routine eye exams. Ask your health care provider how often you should have your eyes checked.  Personal lifestyle choices, including:  Daily care of your teeth and gums.  Regular physical activity.  Eating a healthy diet.  Avoiding tobacco and drug use.  Limiting alcohol use.  Practicing safe sex.  Taking low-dose aspirin every day.  Taking vitamin and mineral supplements as recommended by your health care provider. What happens during an annual well check? The services and screenings done by your health care provider during your annual well check will depend on your age, overall health,  lifestyle risk factors, and family history of disease. Counseling  Your health care provider may ask you questions about your:  Alcohol use.  Tobacco use.  Drug use.  Emotional well-being.  Home and relationship well-being.  Sexual activity.  Eating habits.  History of falls.  Memory and ability to understand (cognition).  Work and work Astronomer.  Reproductive health. Screening  You may have the following tests or measurements:  Height, weight, and BMI.  Blood pressure.  Lipid and cholesterol levels. These may be checked every 5 years, or more frequently if you are over 50 years old.  Skin check.  Lung cancer screening. You may have this screening every year starting at age 30 if you have a 30-pack-year history of smoking and currently smoke or have quit within the past 15 years.  Fecal occult blood test (FOBT) of the stool. You may have this test every year starting at age 71.  Flexible sigmoidoscopy or colonoscopy. You may have a sigmoidoscopy every 5 years or a colonoscopy every 10 years starting at age 25.  Hepatitis C blood test.  Hepatitis B blood test.  Sexually transmitted disease (STD) testing.  Diabetes screening. This is done by checking your blood sugar (glucose) after you have not eaten for a while (fasting). You may have this done every 1-3 years.  Bone density scan. This is done to screen for osteoporosis. You may have this done starting at age 88.  Mammogram. This may be done every 1-2 years. Talk to your health care provider about how often you should have regular mammograms. Talk with your health care provider about your test results, treatment options, and if necessary, the need for more tests. Vaccines  Your health care provider may recommend certain  vaccines, such as:  Influenza vaccine. This is recommended every year.  Tetanus, diphtheria, and acellular pertussis (Tdap, Td) vaccine. You may need a Td booster every 10 years.  Zoster  vaccine. You may need this after age 45.  Pneumococcal 13-valent conjugate (PCV13) vaccine. One dose is recommended after age 41.  Pneumococcal polysaccharide (PPSV23) vaccine. One dose is recommended after age 23. Talk to your health care provider about which screenings and vaccines you need and how often you need them. This information is not intended to replace advice given to you by your health care provider. Make sure you discuss any questions you have with your health care provider. Document Released: 05/17/2015 Document Revised: 01/08/2016 Document Reviewed: 02/19/2015 Elsevier Interactive Patient Education  2017 [place] Prevention in the Home Falls can cause injuries. They can happen to people of all ages. There are many things you can do to make your home safe and to help prevent falls. What can I do on the outside of my home?  Regularly fix the edges of walkways and driveways and fix any cracks.  Remove anything that might make you trip as you walk through a door, such as a raised step or threshold.  Trim any bushes or trees on the path to your home.  Use bright outdoor lighting.  Clear any walking paths of anything that might make someone trip, such as rocks or tools.  Regularly check to see if handrails are loose or broken. Make sure that both sides of any steps have handrails.  Any raised decks and porches should have guardrails on the edges.  Have any leaves, snow, or ice cleared regularly.  Use sand or salt on walking paths during winter.  Clean up any spills in your garage right away. This includes oil or grease spills. What can I do in the bathroom?  Use night lights.  Install grab bars by the toilet and in the tub and shower. Do not use towel bars as grab bars.  Use non-skid mats or decals in the tub or shower.  If you need to sit down in the shower, use a plastic, non-slip stool.  Keep the floor dry. Clean up any water that spills on the  floor as soon as it happens.  Remove soap buildup in the tub or shower regularly.  Attach bath mats securely with double-sided non-slip rug tape.  Do not have throw rugs and other things on the floor that can make you trip. What can I do in the bedroom?  Use night lights.  Make sure that you have a light by your bed that is easy to reach.  Do not use any sheets or blankets that are too big for your bed. They should not hang down onto the floor.  Have a firm chair that has side arms. You can use this for support while you get dressed.  Do not have throw rugs and other things on the floor that can make you trip. What can I do in the kitchen?  Clean up any spills right away.  Avoid walking on wet floors.  Keep items that you use a lot in easy-to-reach places.  If you need to reach something above you, use a strong step stool that has a grab bar.  Keep electrical cords out of the way.  Do not use floor polish or wax that makes floors slippery. If you must use wax, use non-skid floor wax.  Do not have throw rugs and other things  on the floor that can make you trip. What can I do with my stairs?  Do not leave any items on the stairs.  Make sure that there are handrails on both sides of the stairs and use them. Fix handrails that are broken or loose. Make sure that handrails are as long as the stairways.  Check any carpeting to make sure that it is firmly attached to the stairs. Fix any carpet that is loose or worn.  Avoid having throw rugs at the top or bottom of the stairs. If you do have throw rugs, attach them to the floor with carpet tape.  Make sure that you have a light switch at the top of the stairs and the bottom of the stairs. If you do not have them, ask someone to add them for you. What else can I do to help prevent falls?  Wear shoes that:  Do not have high heels.  Have rubber bottoms.  Are comfortable and fit you well.  Are closed at the toe. Do not wear  sandals.  If you use a stepladder:  Make sure that it is fully opened. Do not climb a closed stepladder.  Make sure that both sides of the stepladder are locked into place.  Ask someone to hold it for you, if possible.  Clearly mark and make sure that you can see:  Any grab bars or handrails.  First and last steps.  Where the edge of each step is.  Use tools that help you move around (mobility aids) if they are needed. These include:  Canes.  Walkers.  Scooters.  Crutches.  Turn on the lights when you go into a dark area. Replace any light bulbs as soon as they burn out.  Set up your furniture so you have a clear path. Avoid moving your furniture around.  If any of your floors are uneven, fix them.  If there are any pets around you, be aware of where they are.  Review your medicines with your doctor. Some medicines can make you feel dizzy. This can increase your chance of falling. Ask your doctor what other things that you can do to help prevent falls. This information is not intended to replace advice given to you by your health care provider. Make sure you discuss any questions you have with your health care provider. Document Released: 02/14/2009 Document Revised: 09/26/2015 Document Reviewed: 05/25/2014 Elsevier Interactive Patient Education  2017 [employer].

## 2020-02-21 NOTE — Progress Notes (Signed)
Subjective:   [name] is a 74 y.o. female who presents for Medicare Annual (Subsequent) preventive examination.   I connected with [place] today by telephone and verified that I am speaking with the correct person using two identifiers. Location patient: home Location provider: work Persons participating in the virtual visit: patient, Engineer, civil (consulting).    I discussed the limitations, risks, security and privacy concerns of performing an evaluation and management service by telephone and the availability of in person appointments. I also discussed with the patient that there may be a patient responsible charge related to this service. The patient expressed understanding and verbally consented to this telephonic visit.    Interactive audio and video telecommunications were attempted between this provider and patient, however failed, due to patient having technical difficulties OR patient did not have access to video capability.  We continued and completed visit with audio only.  Some vital signs may be absent or patient reported.   Time Spent with patient on telephone encounter: 20 minutes  Review of Systems     Cardiac Risk Factors include: advanced age (>62men, >86 women);obesity (BMI >30kg/m2);diabetes mellitus;smoking/ tobacco exposure     Objective:    Today's Vitals   02/21/20 0816  Weight: 207 lb (93.9 kg)  Height:  (1.626 m)   Body mass index is 35.53 kg/m.  Advanced Directives 02/21/2020 10/18/2018 08/05/2015 03/27/2015 10/19/2014  Does Patient Have a Medical Advance Directive? Yes Yes Yes Yes No  Type of Estate agent of [place];Living will Healthcare Power of [place];Living will Healthcare Power of [place];Living will Living will;Healthcare Power of Attorney -  Does patient want to make changes to medical advance directive? - No - Patient declined - - -  Copy of Healthcare Power of Attorney in Chart? Yes - validated most recent copy scanned in  chart (See row information) Yes - validated most recent copy scanned in chart (See row information) No - copy requested - -    Current Medications (verified) Outpatient Encounter Medications as of 02/21/2020  Medication Sig  . aspirin EC 81 MG tablet Take 81 mg by mouth daily.  . cholecalciferol (VITAMIN D3) 25 MCG (1000 UNIT) tablet Take 1,000 Units by mouth daily.  . fenofibrate 160 MG tablet Take 1 tablet (160 mg total) by mouth daily.  . folic acid (FOLVITE) 1 MG tablet Take 2 tablets (2 mg total) by mouth daily.  Marland Kitchen losartan (COZAAR) 25 MG tablet Take 1 tablet (25 mg total) by mouth daily.  . MECLIZINE HCL PO Take by mouth as needed.   . metFORMIN (GLUCOPHAGE) 500 MG tablet Take 1 tablet (500 mg total) by mouth 2 (two) times daily.  . Methotrexate Sodium (METHOTREXATE, PF,) 50 MG/2ML injection INJECT 0.7 MLS (17.5 MG TOTAL) INTO THE SKIN ONCE A WEEK. *SINGLE USE VIAL*  . Multiple Vitamins-Minerals (MULTIVITAMIN WITH MINERALS) tablet Take 1 tablet by mouth daily.  . Omega-3 Fatty Acids (OMEGA 3 PO) Take by mouth.  . pravastatin (PRAVACHOL) 20 MG tablet Take 1 tablet (20 mg total) by mouth daily.  . sitaGLIPtin (JANUVIA) 50 MG tablet Take 1 tablet (50 mg total) by mouth daily.  . TUBERCULIN SYR 1CC/27GX1/2" (B-D TB SYRINGE 1CC/27GX1/2") 27G X 1/2" 1 ML MISC 12 Syringes by Does not apply route once a week.  . zolpidem (AMBIEN) 5 MG tablet Take 5 mg by mouth at bedtime as needed for sleep.   No facility-administered encounter medications on file as of 02/21/2020.    Allergies (verified) Lipitor [  atorvastatin calcium]   History: Past Medical History:  Diagnosis Date  . Allergy   . Arthritis   . Chicken pox   . Colon polyp   . COPD (chronic obstructive pulmonary disease) (HCC)   . CVA (cerebral infarction)   . Diabetes mellitus without complication (HCC)   . Diverticulitis   . History of blood transfusion   . Hyperlipidemia   . Insomnia   . Nasal polyps   . Obesity   .  Sinusitis, chronic 03/18/2010   Qualifier: Diagnosis of  By: Ninetta Lights MD, Tinnie Gens     Past Surgical History:  Procedure Laterality Date  . ABDOMINAL HYSTERECTOMY  2000  . APPENDECTOMY    . CARDIAC CATHETERIZATION N/A 03/27/2015   Procedure: Left Heart Cath and Coronary Angiography;  Surgeon: Yates Decamp, MD;  Location[facility] INVASIVE CV LAB;  Service: Cardiovascular;  Laterality: N/A;  . CESAREAN SECTION    . KNEE ARTHROPLASTY    . NASAL SINUS SURGERY    . REPLACEMENT TOTAL KNEE Bilateral 2007 2009  . TONSILLECTOMY AND ADENOIDECTOMY    . WISDOM TOOTH EXTRACTION     Family History  Problem Relation Age of Onset  . Arthritis Mother   . Diabetes Mother        Type I  . Aneurysm Father   . Stroke Father   . Arthritis Sister   . Arthritis Sister   . Down syndrome Son   . Diabetes Son        type I   Social History   Socioeconomic History  . Marital status: Married    Spouse name: Not on file  . Number of children: Not on file  . Years of education: Not on file  . Highest education level: Not on file  Occupational History  . Not on file  Tobacco Use  . Smoking status: Current Every Day Smoker    Packs/day: 0.25    Years: 35.00    Pack years: 8.75    Types: Cigarettes  . Smokeless tobacco: Never Used  . Tobacco comment: 6 cigarettes per day   Vaping Use  . Vaping Use: Never used  Substance and Sexual Activity  . Alcohol use: No    Alcohol/week: 0.0 standard drinks  . Drug use: No  . Sexual activity: Not Currently  Other Topics Concern  . Not on file  Social History Narrative   Marital status/children/pets: Married, 1 child.    Education/employment: Automotive engineer educated, retired.   Safety:      -smoke alarm in the home:Yes     - wears seatbelt: Yes     - Feels safe in their relationships: Yes   Social Determinants of Health   Financial Resource Strain: Low Risk   . Difficulty of Paying Living Expenses: Not hard at all  Food Insecurity: No Food Insecurity  . Worried  About Programme researcher, broadcasting/film/video in the Last Year: Never true  . Ran Out of Food in the Last Year: Never true  Transportation Needs: No Transportation Needs  . Lack of Transportation (Medical): No  . Lack of Transportation (Non-Medical): No  Physical Activity: Sufficiently Active  . Days of Exercise per Week: 6 days  . Minutes of Exercise per Session: 60 min  Stress: Stress Concern Present  . Feeling of Stress : To some extent  Social Connections: Moderately Isolated  . Frequency of Communication with Friends and Family: More than three times a week  . Frequency of Social Gatherings with Friends and Family: Once  a week  . Attends Religious Services: Never  . Active Member of Clubs or Organizations: No  . Attends Banker Meetings: Never  . Marital Status: Married    Tobacco Counseling Ready to quit: Not Answered Counseling given: Not Answered Comment: 6 cigarettes per day    Clinical Intake:  Pre-visit preparation completed: Yes  Pain : No/denies pain     Nutritional Status: BMI > 30  Obese Nutritional Risks: None Diabetes: Yes CBG done?: No Did pt. bring in CBG monitor from home?: No (phone visit)  How often do you need to have someone help you when you read instructions, pamphlets, or other written materials from your doctor or pharmacy?: 1 - Never What is the last grade level you completed in school?: 2 yrs of college  Diabetes:  Is the patient diabetic?  Yes  If diabetic, was a CBG obtained today?  No  Did the patient bring in their glucometer from home?  No phone visit How often do you monitor your CBG's? daily.   Financial Strains and Diabetes Management:  Are you having any financial strains with the device, your supplies or your medication? No .  Does the patient want to be seen by Chronic Care Management for management of their diabetes?  No  Would the patient like to be referred to a Nutritionist or for Diabetic Management?  No   Diabetic  Exams:  Diabetic Eye Exam: Completed 06/28/2019.   Diabetic Foot Exam: Completed 06/05/2019 .     Interpreter Needed?: No  Information entered by :: Thomasenia Sales LPN   Activities of Daily Living In your present state of health, do you have any difficulty performing the following activities: 02/21/2020  Hearing? N  Vision? N  Difficulty concentrating or making decisions? N  Walking or climbing stairs? N  Dressing or bathing? N  Doing errands, shopping? N  Preparing Food and eating ? N  Using the Toilet? N  In the past six months, have you accidently leaked urine? N  Do you have problems with loss of bowel control? N  Managing your Medications? N  Managing your Finances? N  Housekeeping or managing your Housekeeping? N  Some recent data might be hidden    Patient Care Team: Natalia Leatherwood, DO as PCP - General (Family Medicine) Pollyann Savoy, MD as Consulting Physician (Rheumatology) Charna Elizabeth, MD as Consulting Physician (Gastroenterology) Antionette Char, Sharl Ma, OD (Optometry)  Indicate any recent Medical Services you may have received from other than [facility] providers in the past year (date may be approximate).     Assessment:   This is a routine wellness examination for [place].  Hearing/Vision screen  Hearing Screening   125Hz  250Hz  500Hz  1000Hz  2000Hz  3000Hz  4000Hz  6000Hz  8000Hz   Right ear:           Left ear:           Vision Screening Comments: Reading glasses Last eye exam-06/2019-Dr. Bull  Dietary issues and exercise activities discussed: Current Exercise Habits: Home exercise routine, Type of exercise: Other - see comments (yard work), Time (Minutes): 60, Frequency (Times/Week): 6, Weekly Exercise (Minutes/Week): 360, Intensity: Mild, Exercise limited by: None identified  Goals    . Patient Stated     Increase activity & start going back to the Gym      Depression Screen PHQ 2/9 Scores 02/21/2020 02/15/2020 10/26/2019 10/18/2018 02/14/2018 10/20/2016  08/05/2015  PHQ - 2 Score 0 0 0 0 0 0 0    Fall Risk  Fall Risk  02/21/2020 02/15/2020 10/18/2018 02/14/2018 10/20/2016  Falls in the past year? 0 0 0 No No  Number falls in past yr: 0 0 - - -  Injury with Fall? 0 0 - - -  Follow up Falls prevention discussed Falls evaluation completed - - -    Any stairs in or around the home? Yes  If so, are there any without handrails? No  Home free of loose throw rugs in walkways, pet beds, electrical cords, etc? Yes  Adequate lighting in your home to reduce risk of falls? Yes   ASSISTIVE DEVICES UTILIZED TO PREVENT FALLS:  Life alert? No  Use of a cane, walker or w/c? No  Grab bars in the bathroom? Yes  Shower chair or bench in shower? No  Elevated toilet seat or a handicapped toilet? No   TIMED UP AND GO:  Was the test performed? No . Phone visit   Cognitive Function:No cognitive impairment noted.        Immunizations Immunization History  Administered Date(s) Administered  . Fluad Quad(high Dose 65+) 01/11/2019, 02/15/2020  . Influenza Whole 04/21/2010  . Influenza, High Dose Seasonal PF 02/20/2013, 02/07/2018  . Influenza,inj,Quad PF,6+ Mos 02/23/2017  . Influenza-Unspecified 02/15/2014, 01/30/2016, 02/07/2018, 01/11/2019  . PFIZER SARS-COV-2 Vaccination 06/08/2019, 07/03/2019, 01/04/2020  . Pneumococcal Conjugate-13 02/20/2013  . Pneumococcal Polysaccharide-23 04/05/2006  . Pneumococcal-Unspecified 03/05/2015  . Tdap 09/14/2001, 03/05/2015  . Tetanus 03/27/2014  . Zoster 12/07/2013  . Zoster Recombinat (Shingrix) 10/12/2017, 02/07/2018    TDAP status: Up to date   Flu Vaccine status: Up to date   Pneumococcal vaccine status: Up to date   Covid-19 vaccine status: Completed vaccines  Qualifies for Shingles Vaccine? No   Zostavax completed Yes   Shingrix Completed?: Yes  Screening Tests Health Maintenance  Topic Date Due  . FOOT EXAM  06/04/2020  . OPHTHALMOLOGY EXAM  06/27/2020  . HEMOGLOBIN A1C  08/15/2020  .  MAMMOGRAM  01/18/2021  . TETANUS/TDAP  03/04/2025  . COLONOSCOPY  03/26/2029  . INFLUENZA VACCINE  Completed  . DEXA SCAN  Completed  . COVID-19 Vaccine  Completed  . Hepatitis C Screening  Completed  . PNA vac Low Risk Adult  Completed    Health Maintenance  There are no preventive care reminders to display for this patient.  Colorectal cancer screening: Completed 03/27/2019. Repeat every 10 years   Mammogram Status: Due- Patient states she will call to schedule.  Bone Density status: Completed 12/14/2017. Results reflect: Bone density results: NORMAL. Repeat every 2-5 years. Patient to discuss with PCP  Lung Cancer Screening: (Low Dose CT Chest recommended if Age 72-80 years, 30 pack-year currently smoking OR have quit w/in 15years.) does not qualify.     Additional Screening:  Hepatitis C Screening:  Completed 06/28/2019  Vision Screening: Recommended annual ophthalmology exams for early detection of glaucoma and other disorders of the eye. Is the patient up to date with their annual eye exam?  Yes  Who is the provider or what is the name of the office in which the patient attends annual eye exams? Dr. Antionette Char   Dental Screening: Recommended annual dental exams for proper oral hygiene  Community Resource Referral / Chronic Care Management: CRR required this visit?  No   CCM required this visit?  No      Plan:     I have personally reviewed and noted the following in the patient's chart:   . Medical and social history . Use of alcohol, tobacco  or illicit drugs  . Current medications and supplements . Functional ability and status . Nutritional status . Physical activity . Advanced directives . List of other physicians . Hospitalizations, surgeries, and ER visits in previous 12 months . Vitals . Screenings to include cognitive, depression, and falls . Referrals and appointments  In addition, I have reviewed and discussed with patient certain preventive  protocols, quality metrics, and best practice recommendations. A written personalized care plan for preventive services as well as general preventive health recommendations were provided to patient.   Due to this being a telephonic visit, the after visit summary with patients personalized plan was offered to patient via mail or my-chart. Patient would like to access on my-chart.   Roanna RaiderMartha A Kalub Morillo, LPN   [id number]/20/2021  Nurse Health Advisor  Nurse Notes: None

## 2020-04-02 ENCOUNTER — Other Ambulatory Visit: Payer: Self-pay

## 2020-04-02 DIAGNOSIS — Z79899 Other long term (current) drug therapy: Secondary | ICD-10-CM | POA: Diagnosis not present

## 2020-04-03 ENCOUNTER — Other Ambulatory Visit: Payer: Self-pay | Admitting: Rheumatology

## 2020-04-03 LAB — COMPLETE METABOLIC PANEL WITH GFR
AG Ratio: 1.4 (calc) (ref 1.0–2.5)
ALT: 17 U/L (ref 6–29)
AST: 23 U/L (ref 10–35)
Albumin: 4.3 g/dL (ref 3.6–5.1)
Alkaline phosphatase (APISO): 32 U/L — ABNORMAL LOW (ref 37–153)
BUN: 21 mg/dL (ref 7–25)
CO2: 27 mmol/L (ref 20–32)
Calcium: 9.4 mg/dL (ref 8.6–10.4)
Chloride: 105 mmol/L (ref 98–110)
Creat: 0.9 mg/dL (ref 0.60–0.93)
GFR, Est African American: 74 mL/min/{1.73_m2} (ref >=60)
GFR, Est Non African American: 64 mL/min/{1.73_m2} (ref >=60)
Globulin: 3 g/dL (calc) (ref 1.9–3.7)
Glucose, Bld: 120 mg/dL (ref 65–139)
Potassium: 4.4 mmol/L (ref 3.5–5.3)
Sodium: 138 mmol/L (ref 135–146)
Total Bilirubin: 0.4 mg/dL (ref 0.2–1.2)
Total Protein: 7.3 g/dL (ref 6.1–8.1)

## 2020-04-03 LAB — CBC WITH DIFFERENTIAL/PLATELET
Absolute Monocytes: 448 cells/uL (ref 200–950)
Basophils Absolute: 39 cells/uL (ref 0–200)
Basophils Relative: 0.7 %
Eosinophils Absolute: 168 cells/uL (ref 15–500)
Eosinophils Relative: 3 %
HCT: 40.5 % (ref 35.0–45.0)
Hemoglobin: 13.6 g/dL (ref 11.7–15.5)
Lymphs Abs: 1882 cells/uL (ref 850–3900)
MCH: 30.2 pg (ref 27.0–33.0)
MCHC: 33.6 g/dL (ref 32.0–36.0)
MCV: 90 fL (ref 80.0–100.0)
MPV: 9.9 fL (ref 7.5–12.5)
Monocytes Relative: 8 %
Neutro Abs: 3063 cells/uL (ref 1500–7800)
Neutrophils Relative %: 54.7 %
Platelets: 292 10*3/uL (ref 140–400)
RBC: 4.5 10*6/uL (ref 3.80–5.10)
RDW: 13 % (ref 11.0–15.0)
Total Lymphocyte: 33.6 %
WBC: 5.6 10*3/uL (ref 3.8–10.8)

## 2020-04-03 NOTE — Progress Notes (Signed)
CBC and CMP are stable.

## 2020-04-04 NOTE — Telephone Encounter (Signed)
Last Visit: 01/11/2020 Next Visit: 06/13/2020 Labs: 04/02/2020 CBC and CMP are stable.  Current Dose per office note 01/11/2020: methotrexate 0.7 mL sq injections once weekly  DX: Rheumatoid arthritis of multiple sites with negative rheumatoid factor   Okay to refill per Dr. Corliss Skains

## 2020-04-14 ENCOUNTER — Other Ambulatory Visit: Payer: Self-pay | Admitting: Rheumatology

## 2020-04-14 DIAGNOSIS — M0609 Rheumatoid arthritis without rheumatoid factor, multiple sites: Secondary | ICD-10-CM

## 2020-04-15 NOTE — Telephone Encounter (Signed)
Last Visit:01/11/2020 Next Visit:06/13/2020  Okay to refill per Dr. Deveshwar  

## 2020-05-02 ENCOUNTER — Other Ambulatory Visit: Payer: Self-pay | Admitting: Rheumatology

## 2020-05-02 DIAGNOSIS — M0609 Rheumatoid arthritis without rheumatoid factor, multiple sites: Secondary | ICD-10-CM

## 2020-05-02 NOTE — Telephone Encounter (Signed)
Last Visit:01/11/2020 Next Visit:06/13/2020  Okay to refill per Dr. Deveshwar  

## 2020-05-16 ENCOUNTER — Ambulatory Visit (INDEPENDENT_AMBULATORY_CARE_PROVIDER_SITE_OTHER): Payer: PPO | Admitting: Family Medicine

## 2020-05-16 ENCOUNTER — Encounter: Payer: Self-pay | Admitting: Family Medicine

## 2020-05-16 ENCOUNTER — Other Ambulatory Visit: Payer: Self-pay

## 2020-05-16 VITALS — BP 93/62 | HR 86 | Temp 98.3°F | Ht 64.0 in | Wt 206.0 lb

## 2020-05-16 DIAGNOSIS — Z8371 Family history of colonic polyps: Secondary | ICD-10-CM | POA: Insufficient documentation

## 2020-05-16 DIAGNOSIS — E1169 Type 2 diabetes mellitus with other specified complication: Secondary | ICD-10-CM | POA: Diagnosis not present

## 2020-05-16 DIAGNOSIS — K648 Other hemorrhoids: Secondary | ICD-10-CM | POA: Insufficient documentation

## 2020-05-16 DIAGNOSIS — Z83719 Family history of colon polyps, unspecified: Secondary | ICD-10-CM

## 2020-05-16 DIAGNOSIS — G479 Sleep disorder, unspecified: Secondary | ICD-10-CM | POA: Diagnosis not present

## 2020-05-16 DIAGNOSIS — E1136 Type 2 diabetes mellitus with diabetic cataract: Secondary | ICD-10-CM

## 2020-05-16 DIAGNOSIS — M0609 Rheumatoid arthritis without rheumatoid factor, multiple sites: Secondary | ICD-10-CM | POA: Diagnosis not present

## 2020-05-16 DIAGNOSIS — I152 Hypertension secondary to endocrine disorders: Secondary | ICD-10-CM | POA: Diagnosis not present

## 2020-05-16 DIAGNOSIS — E785 Hyperlipidemia, unspecified: Secondary | ICD-10-CM

## 2020-05-16 DIAGNOSIS — K5730 Diverticulosis of large intestine without perforation or abscess without bleeding: Secondary | ICD-10-CM

## 2020-05-16 DIAGNOSIS — E1159 Type 2 diabetes mellitus with other circulatory complications: Secondary | ICD-10-CM

## 2020-05-16 DIAGNOSIS — R194 Change in bowel habit: Secondary | ICD-10-CM | POA: Insufficient documentation

## 2020-05-16 DIAGNOSIS — E118 Type 2 diabetes mellitus with unspecified complications: Secondary | ICD-10-CM | POA: Diagnosis not present

## 2020-05-16 DIAGNOSIS — Z1211 Encounter for screening for malignant neoplasm of colon: Secondary | ICD-10-CM | POA: Insufficient documentation

## 2020-05-16 DIAGNOSIS — E559 Vitamin D deficiency, unspecified: Secondary | ICD-10-CM

## 2020-05-16 DIAGNOSIS — K625 Hemorrhage of anus and rectum: Secondary | ICD-10-CM

## 2020-05-16 HISTORY — DX: Family history of colonic polyps: Z83.71

## 2020-05-16 HISTORY — DX: Diverticulosis of large intestine without perforation or abscess without bleeding: K57.30

## 2020-05-16 HISTORY — DX: Family history of colon polyps, unspecified: Z83.719

## 2020-05-16 HISTORY — DX: Hemorrhage of anus and rectum: K62.5

## 2020-05-16 HISTORY — DX: Other hemorrhoids: K64.8

## 2020-05-16 LAB — POCT GLYCOSYLATED HEMOGLOBIN (HGB A1C)
HbA1c POC (<> result, manual entry): 6.5 % (ref 4.0–5.6)
HbA1c, POC (controlled diabetic range): 6.5 % (ref 0.0–7.0)
HbA1c, POC (prediabetic range): 6.5 % — AB (ref 5.7–6.4)
Hemoglobin A1C: 6.5 % — AB (ref 4.0–5.6)

## 2020-05-16 MED ORDER — METFORMIN HCL 500 MG PO TABS: 500.0000 mg | ORAL_TABLET | Freq: Two times a day (BID) | ORAL | 1 refills | Status: DC

## 2020-05-16 MED ORDER — PRAVASTATIN SODIUM 20 MG PO TABS
20.0000 mg | ORAL_TABLET | Freq: Every day | ORAL | 3 refills | Status: DC
Start: 2020-05-16 — End: 2020-11-13

## 2020-05-16 MED ORDER — ZOLPIDEM TARTRATE 5 MG PO TABS: 5.0000 mg | ORAL_TABLET | Freq: Every evening | ORAL | 1 refills | Status: DC | PRN

## 2020-05-16 MED ORDER — LOSARTAN POTASSIUM 25 MG PO TABS: 25.0000 mg | ORAL_TABLET | Freq: Every day | ORAL | 1 refills | Status: DC

## 2020-05-16 MED ORDER — SITAGLIPTIN PHOSPHATE 50 MG PO TABS
50.0000 mg | ORAL_TABLET | Freq: Every day | ORAL | 1 refills | Status: DC
Start: 2020-05-16 — End: 2020-05-16

## 2020-05-16 MED ORDER — SITAGLIPTIN PHOSPHATE 50 MG PO TABS: 50.0000 mg | ORAL_TABLET | Freq: Every day | ORAL | 1 refills | Status: DC

## 2020-05-16 NOTE — Progress Notes (Signed)
This visit occurred during the SARS-CoV-2 public health emergency.  Safety protocols were in place, including screening questions prior to the visit, additional usage of staff PPE, and extensive cleaning of exam room while observing appropriate contact time as indicated for disinfecting solutions.    Patient ID: Jennifer BridgeMarita M Hoffman, female  DOB: [birth year], 75 y.o.   MRN: [id number] Patient Care Team    Relationship Specialty Notifications Start End  Natalia LeatherwoodKuneff, Kashawn Dirr A, DO PCP - General Family Medicine  10/10/19   Pollyann Savoyeveshwar, Shaili, MD Consulting Physician Rheumatology  10/10/19   Charna ElizabethMann, Jyothi, MD Consulting Physician Gastroenterology  10/10/19   Renae FickleBull, Thomas Anthony, OD  Optometry  10/10/19     Chief Complaint  Patient presents with  . [facility]    [facility]    [name] is a 75 y.o.  Female  [facility]pertension associated with diabetes (HCC)/obesity/statin intolerance/hyperlipidemia Pt reports compliance  with losartan 25 mg daily, start of pravastatin and fenofibrate. Blood pressures ranges at home limits. Patient denies chest pain, shortness of breath, dizziness or lower extremity edema.  Pt takes a daily baby ASA.      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  Patient is prescribed methotrexate and folate.  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  She reports she was diagnosed with diabetes about 20 years ago. Patient denies dizziness, hyperglycemic or hypoglycemic events. Patient denies numbness, tingling in the extremities or nonhealing wounds of feet.  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  She is managed closely by  her ophthalmology team Dr. Antionette CharBull> has APPT in April 2022- retinopathy present.  A1c: Last A1c 7.7> 7.4> 7.6>6.5 today  vitamin D deficiency Vitamin D levels are in normal range.  She is tolerating supplement 2000 un it daily.   Sleep disturbance Patient reports she is prescribed Ambien 5 mg nightly as needed.  She very rarely takes this medication.  When she does she takes the medication she takes one third of a tab.  States she has had the same bottle for over a year with 30 tabs in it.  She would like refills on this today.  [facility] 2/9 02/21/2020 02/15/2020 10/26/2019 10/18/2018 02/14/2018  Decreased Interest 0 0 0 - 0  Down, Depressed, Hopeless 0 0 0 0 0  PHQ - 2 Score 0 0 0 0 0   No flowsheet data found.   Immunization History  Administered Date(s) Administered  . Fluad Quad(high Dose 65+) 01/11/2019, 02/15/2020  . Influenza Whole 04/21/2010  . Influenza, High Dose Seasonal PF 02/20/2013, 02/07/2018  . Influenza,inj,Quad PF,6+ Mos 02/23/2017  . Influenza-Unspecified 02/15/2014, 01/30/2016, 02/07/2018, 01/11/2019  . PFIZER SARS-COV-2 Vaccination 06/08/2019, 07/03/2019, 01/04/2020  . Pneumococcal Conjugate-13 02/20/2013  . Pneumococcal Polysaccharide-23 04/05/2006  . Pneumococcal-Unspecified 03/05/2015  . Tdap 09/14/2001, 03/05/2015  . Tetanus 03/27/2014  . Zoster 12/07/2013  . Zoster Recombinat (Shingrix) 10/12/2017, 02/07/2018     Past Medical History:  Diagnosis Date  . Allergy   . Arthritis   . Chicken pox   . Colon polyp   . COPD (chronic obstructive pulmonary disease) (HCC)   . CVA (cerebral infarction)   . Diabetes mellitus without complication (HCC)   . Diverticulitis   . History of blood  transfusion   . Hyperlipidemia   . Insomnia   . Nasal polyps   . Obesity   . Sinusitis, chronic 03/18/2010   Qualifier: Diagnosis of  By: Ninetta Lights MD, Tinnie Gens     Allergies  Allergen Reactions  . Lipitor [Atorvastatin Calcium]    Past Surgical History:   Procedure Laterality Date  . ABDOMINAL HYSTERECTOMY  2000  . APPENDECTOMY    . CARDIAC CATHETERIZATION N/A 03/27/2015   Procedure: Left Heart Cath and Coronary Angiography;  Surgeon: Yates Decamp, MD;  Location: [facility] INVASIVE CV LAB;  Service: Cardiovascular;  Laterality: N/A;  . CESAREAN SECTION    . KNEE ARTHROPLASTY    . NASAL SINUS SURGERY    . REPLACEMENT TOTAL KNEE Bilateral 2007 2009  . TONSILLECTOMY AND ADENOIDECTOMY    . WISDOM TOOTH EXTRACTION     Family History  Problem Relation Age of Onset  . Arthritis Mother   . Diabetes Mother        Type I  . Aneurysm Father   . Stroke Father   . Arthritis Sister   . Arthritis Sister   . Down syndrome Son   . Diabetes Son        type I   Social History   Social History Narrative   Marital status/children/pets: Married, 1 child.    Education/employment: Automotive engineer educated, retired.   Safety:      -smoke alarm in the home:Yes     - wears seatbelt: Yes     - Feels safe in their relationships: Yes    Allergies as of 05/16/2020      Reactions   Lipitor [atorvastatin Calcium]       Medication List       Accurate as of May 16, 2020 12:42 PM. If you have any questions, ask your nurse or doctor.        STOP taking these medications   permethrin 5 % cream Commonly known as: ELIMITE Stopped by: Felix Pacini, DO     TAKE these medications   aspirin EC 81 MG tablet Take 81 mg by mouth daily.   B-D TB SYRINGE 1CC/27GX1/2" 27G X 1/2" 1 ML Misc Generic drug: TUBERCULIN SYR 1CC/27GX1/2" 12 SYRINGES BY DOES NOT APPLY ROUTE ONCE A WEEK.   cholecalciferol 25 MCG (1000 UNIT) tablet Commonly known as: VITAMIN D3 Take 1,000 Units by mouth daily.   fenofibrate 160 MG tablet Take 1 tablet (160 mg total) by mouth daily.   folic acid 1 MG tablet Commonly known as: FOLVITE TAKE 2 TABLETS BY MOUTH EVERY DAY   losartan 25 MG tablet Commonly known as: COZAAR Take 1 tablet (25 mg total) by mouth daily.   MECLIZINE HCL  PO Take by mouth as needed.   metFORMIN 500 MG tablet Commonly known as: GLUCOPHAGE Take 1 tablet (500 mg total) by mouth 2 (two) times daily.   methotrexate (PF) 50 MG/2ML injection INJECT 0.7 MLS (17.5 MG TOTAL) INTO THE SKIN ONCE A WEEK. *SINGLE USE VIAL*   multivitamin with minerals tablet Take 1 tablet by mouth daily.   OMEGA 3 PO Take by mouth.   pravastatin 20 MG tablet Commonly known as: PRAVACHOL Take 1 tablet (20 mg total) by mouth daily.   PRENATAL+DHA PO Take by mouth.   sitaGLIPtin 50 MG tablet Commonly known as: JANUVIA Take 1 tablet (50 mg total) by mouth daily.   zolpidem 5 MG tablet Commonly known as: AMBIEN Take 1 tablet (5 mg total) by mouth at bedtime as needed  for sleep.       All past medical history, surgical history, allergies, family history, immunizations andmedications were updated in the EMR today and reviewed under the history and medication portions of their EMR.      ROS: 14 pt review of systems performed and negative (unless mentioned in an HPI)  Objective: BP 93/62   Pulse 86   Temp 98.3 F (36.8 C) (Oral)   Ht 5\' 4"  (1.626 m)   Wt 206 lb (93.4 kg)   SpO2 96%   BMI 35.36 kg/m  Gen: Afebrile. No acute distress.  Nontoxic, pleasant, obese, female HENT: AT. [place].  Eyes:Pupils Equal Round Reactive to light, Extraocular movements intact,  Conjunctiva without redness, discharge or icterus. Neck/lymp/endocrine: Supple, no lymphadenopathy, no thyromegaly CV: RRR no murmur, no edema, +2/4 P posterior tibialis pulses Chest: CTAB, no wheeze or crackles Abd: Soft.  Obese. NTND. BS present.  Skin: No rashes, purpura or petechiae.  Neuro:  Normal gait. PERLA. EOMi. Alert. Oriented x3 Psych: Normal affect, dress and demeanor. Normal speech. Normal thought content and judgment.   Results for orders placed or performed in visit on 05/16/20 (from the past 24 hour(s))  POCT HgB A1C     Status: Abnormal   Collection Time: 05/16/20 10:38 AM   Result Value Ref Range   Hemoglobin A1C 6.5 (A) 4.0 - 5.6 %   HbA1c POC (<> result, manual entry) 6.5 4.0 - 5.6 %   HbA1c, POC (prediabetic range) 6.5 (A) 5.7 - 6.4 %   HbA1c, POC (controlled diabetic range) 6.5 0.0 - 7.0 %    No exam data present  Assessment/plan: VERSIA MIGNOGNA is a 75 y.o. female present for  Hypertension/Hyperlipidemia  -Stable -Continue losartan 25 mg qd -Continue pravastatin -Continue omega-3 and fenofibrate. -Labs up-to-date  Osteopenia of multiple sites/vitamin D deficiency Stable.  She is supplementing with vitamin D 2000 units daily  diabetes (HCC)/obesity/cataracts associated with diabetes Improving since starting Januvia. Continue metformin 500 mg twice daily for now> had SE to higher dose.  Continue Januvia 25 mg daily Continue routine exercise and low-sodium diet. 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  She is managed closely by her ophthalmology team Dr. 07/2019 has APPT in April 2022- retinopathy present.  A1c:7.4>>7.6>6.5 today Follow-up in 5 months with other chronic conditions.  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 stable  Sleep disturbance: Stable Continue Ambien half tab as needed-rarely uses medication. May 2022 [place] controlled substance database reviewed today    Return in about 5 months (around 10/28/2020) for CPE (30 min), [facility] (30 min).  Orders Placed This Encounter  Procedures  . POCT HgB A1C   Meds ordered this encounter  Medications  . DISCONTD: sitaGLIPtin (JANUVIA) 50 MG tablet    Sig: Take 1 tablet (50 mg total) by mouth daily.    Dispense:  90 tablet    Refill:  1  . pravastatin (PRAVACHOL) 20 MG tablet    Sig: Take 1 tablet  (20 mg total) by mouth daily.    Dispense:  90 tablet    Refill:  3  . losartan (COZAAR) 25 MG tablet    Sig: Take 1 tablet (25 mg total) by mouth daily.    Dispense:  90 tablet    Refill:  1  . metFORMIN (  GLUCOPHAGE) 500 MG tablet    Sig: Take 1 tablet (500 mg total) by mouth 2 (two) times daily.    Dispense:  180 tablet    Refill:  1  . sitaGLIPtin (JANUVIA) 50 MG tablet    Sig: Take 1 tablet (50 mg total) by mouth daily.    Dispense:  90 tablet    Refill:  1  . zolpidem (AMBIEN) 5 MG tablet    Sig: Take 1 tablet (5 mg total) by mouth at bedtime as needed for sleep.    Dispense:  30 tablet    Refill:  1   Referral Orders  No referral(s) requested today     Electronically signed by: Felix Pacini, DO [facility]

## 2020-05-16 NOTE — Patient Instructions (Addendum)
Try bag balm for your feet and skin.    Your a1c is much better at 6.5 today   Next appt after June 25 for physcial

## 2020-05-31 NOTE — Progress Notes (Signed)
Office Visit Note  Patient: [name]             Date of Birth: 1946/01/18           MRN: [id number]             PCP: Natalia Leatherwood, DO Referring: Natalia Leatherwood, DO Visit Date: 06/13/2020 Occupation: @GUAROCC @  Subjective:  Other (Bilateral hand pain )   History of Present Illness: [name] is a 75 y.o. female with history of rheumatoid arthritis and osteoarthritis.  She states she has been taking methotrexate on a regular basis.  She has not noticed any joint swelling.  She has been experiencing some discomfort in her left thumb which she describes over the [facility] joint.  Bilateral knee joints are replaced which also causes pain off and on.  Activities of Daily Living:  Patient reports morning stiffness for 1 hour.   Patient Reports nocturnal pain.  Difficulty dressing/grooming: Denies Difficulty climbing stairs: Denies Difficulty getting out of chair: Denies Difficulty using hands for taps, buttons, cutlery, and/or writing: Reports  Review of Systems  Constitutional: Positive for fatigue.  HENT: Negative for mouth sores, mouth dryness and nose dryness.   Eyes: Negative for pain, itching and dryness.  Respiratory: Negative for shortness of breath and difficulty breathing.   Cardiovascular: Negative for chest pain and palpitations.  Gastrointestinal: Negative for blood in stool, constipation and diarrhea.  Endocrine: Negative for increased urination.  Genitourinary: Negative for difficulty urinating.  Musculoskeletal: Positive for arthralgias, joint pain, myalgias, morning stiffness, muscle tenderness and myalgias. Negative for joint swelling.  Skin: Negative for color change, rash and redness.  Allergic/Immunologic: Negative for susceptible to infections.  Neurological: Positive for dizziness. Negative for numbness, headaches, memory loss and weakness.  Hematological: Negative for bruising/bleeding tendency.  Psychiatric/Behavioral: Negative for confusion.     PMFS History:  Patient Active Problem List   Diagnosis Date Noted  . Change in bowel habit 05/16/2020  . 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 Family history of colonic polyps 05/16/2020  . Internal hemorrhoids 05/16/2020  . Morbid obesity (HCC) 05/16/2020  . Rectal bleeding 05/16/2020  . Radial styloid tenosynovitis of left hand 12/21/2019  . Schamberg's purpura 10/26/2019  . Thinning hair 10/13/2019  . Sleep disturbance 10/13/2019  . Vitamin D insufficiency 10/10/2019  . 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 Hypermetropia of both eyes 04/22/2018  . Hyperopia with presbyopia 04/22/2018  . Osteopenia 02/14/2016  . Cataract associated with type 2 diabetes mellitus (HCC) 02/14/2016  . Obesity (BMI 30-39.9) 08/03/2014  . Type 2 diabetes mellitus with complication, without long-term current use of insulin (HCC) 12/12/2013  . Hypertension associated with diabetes (HCC) 02/16/2012  . Occasional cigarette smoker 04/07/2010  . Hyperlipidemia associated with type 2 diabetes mellitus (HCC) 03/18/2010    Past Medical History:  Diagnosis Date  . Allergy   . Arthritis   . Chicken pox   . Colon polyp   . COPD (chronic obstructive pulmonary disease) (HCC)   . CVA (cerebral infarction)   . Diabetes mellitus without complication (HCC)   . Diverticulitis   . History of blood transfusion   . Hyperlipidemia   . Insomnia   . Nasal polyps   . Obesity   . Sinusitis, chronic 03/18/2010   Qualifier: Diagnosis of  By: 03/20/2010 MD, Ninetta Lights      Family History  Problem Relation  Age of Onset  . Arthritis Mother   . Diabetes Mother        Type I  . Aneurysm Father   . Stroke Father   . Arthritis Sister   . Arthritis Sister   . Down syndrome Son   . Diabetes Son        type I   Past Surgical History:  Procedure Laterality Date  . ABDOMINAL HYSTERECTOMY  2000  .  APPENDECTOMY    . CARDIAC CATHETERIZATION N/A 03/27/2015   Procedure: Left Heart Cath and Coronary Angiography;  Surgeon: Yates Decamp, MD;  Location: [facility] INVASIVE CV LAB;  Service: Cardiovascular;  Laterality: N/A;  . CESAREAN SECTION    . KNEE ARTHROPLASTY    . NASAL SINUS SURGERY    . REPLACEMENT TOTAL KNEE Bilateral 2007 2009  . TONSILLECTOMY AND ADENOIDECTOMY    . WISDOM TOOTH EXTRACTION     Social History   Social History Narrative   Marital status/children/pets: Married, 1 child.    Education/employment: Automotive engineer educated, retired.   Safety:      -smoke alarm in the home:Yes     - wears seatbelt: Yes     - Feels safe in their relationships: Yes   Immunization History  Administered Date(s) Administered  . Fluad Quad(high Dose 65+) 01/11/2019, 02/15/2020  . Influenza Whole 04/21/2010  . Influenza, High Dose Seasonal PF 02/20/2013, 02/07/2018  . Influenza,inj,Quad PF,6+ Mos 02/23/2017  . Influenza-Unspecified 02/15/2014, 01/30/2016, 02/07/2018, 01/11/2019  . PFIZER(Purple Top)SARS-COV-2 Vaccination 06/08/2019, 07/03/2019, 01/04/2020  . Pneumococcal Conjugate-13 02/20/2013  . Pneumococcal Polysaccharide-23 04/05/2006  . Pneumococcal-Unspecified 03/05/2015  . Tdap 09/14/2001, 03/05/2015  . Tetanus 03/27/2014  . Zoster 12/07/2013  . Zoster Recombinat (Shingrix) 10/12/2017, 02/07/2018     Objective: Vital Signs: BP 112/74 (BP Location: Left Arm, Patient Position: Sitting, Cuff Size: Large)   Pulse 83   Resp 16   Ht 5\' 4"  (1.626 m)   Wt 208 lb (94.3 kg)   BMI 35.70 kg/m    Physical Exam Vitals and nursing note reviewed.  Constitutional:      Appearance: She is well-developed and well-nourished.  HENT:     Head: Normocephalic and atraumatic.  Eyes:     Extraocular Movements: EOM normal.     Conjunctiva/sclera: Conjunctivae normal.  Cardiovascular:     Rate and Rhythm: Normal rate and regular rhythm.     Pulses: Intact distal pulses.     Heart sounds: Normal heart  sounds.  Pulmonary:     Effort: Pulmonary effort is normal.     Breath sounds: Normal breath sounds.  Abdominal:     General: Bowel sounds are normal.     Palpations: Abdomen is soft.  Musculoskeletal:     Cervical back: Normal range of motion.  Lymphadenopathy:     Cervical: No cervical adenopathy.  Skin:    General: Skin is warm and dry.     Capillary Refill: Capillary refill takes less than 2 seconds.  Neurological:     Mental Status: She is alert and oriented to person, place, and time.  Psychiatric:        Mood and Affect: Mood and affect normal.        Behavior: Behavior normal.      Musculoskeletal Exam: C-spine was in good range of motion.  Shoulder joints and elbow joints with good range of motion.  She had no synovitis over wrist joints.  She had tenderness over left CMC joint.  No MCP or PIP swelling was noted.  She had good range of motion of her hip joints.  Bilateral knee joints are replaced and were warm to touch.  She had no tenderness over ankles or MTPs.  CDAI Exam: CDAI Score: 0.3  Patient Global: 2 mm; Provider Global: 1 mm Swollen: 0 ; Tender: 0  Joint Exam 06/13/2020   No joint exam has been documented for this visit   There is currently no information documented on the homunculus. Go to the Rheumatology activity and complete the homunculus joint exam.  Investigation: No additional findings.  Imaging: No results found.  Recent Labs: Lab Results  Component Value Date   WBC 6.9 06/06/2020   HGB 13.8 06/06/2020   PLT 336 06/06/2020   NA 138 06/06/2020   K 4.5 06/06/2020   CL 102 06/06/2020   CO2 26 06/06/2020   GLUCOSE 121 (H) 06/06/2020   BUN 23 06/06/2020   CREATININE 0.88 06/06/2020   BILITOT 0.4 06/06/2020   ALKPHOS 31 (L) 02/15/2020   AST 23 06/06/2020   ALT 15 06/06/2020   PROT 7.4 06/06/2020   ALBUMIN 4.3 02/15/2020   CALCIUM 9.6 06/06/2020   GFRAA 76 06/06/2020   QFTBGOLDPLUS NEGATIVE 06/28/2019    Speciality Comments: No  specialty comments available.  Procedures:  No procedures performed Allergies: Lipitor [atorvastatin calcium]   Assessment / Plan:     Visit Diagnoses: Rheumatoid arthritis of multiple sites with negative rheumatoid factor (HCC)-her rheumatoid arthritis is well controlled.  She had no synovitis on my examination today.  High risk medication use - methotrexate 0.7 mL sq injections once weekly and folic acid 2 mg by mouth daily.  Labs are stable.  We will continue to monitor labs every 3 months.  Primary osteoarthritis of both hands-she complains of discomfort in her left CMC joint off and on.  I offered a CMC brace which she declined.  She had a good response to cortisone injection in the past.  The pain is not severe at this point.  Joint protection and muscle strengthening was discussed.  Status post total bilateral knee replacement-she continues to have discomfort in her bilateral knee joints which are replaced.  Primary osteoarthritis of both feet-she denies discomfort today.  Other fatigue-she complains of chronic fatigue.  Osteopenia of multiple sites - DEXA on 12/14/17: The BMD measured at Femur Neck Right is 0.971 g/cm2 with a T-score of -0.5. Repeat DEXA in 5 years.    Vitamin D deficiency-her last vitamin D was low.  She has been taking vitamin D 1000 units daily.  Other medical problems are listed as follows:  Myalgia due to statin  Statin intolerance  Diabetes mellitus type 2 with complications (HCC)  Hypertension associated with diabetes (HCC)-her blood pressure is within normal limits.  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  Association of smoking with rheumatoid arthritis was discussed.  Obesity (BMI 30-39.9)-weight loss diet and exercise was emphasized.   Handout was placed in the AVS.  Educated about COVID-19 virus infection-she is fully vaccinated against COVID-19 and also received her third dose.  A fourth dose (booster) 6 months after the third dose was advised for immunosuppressed individuals.  Use of mask, social distancing and hand hygiene discussed.  Orders: No orders of the defined types were placed in this encounter.  No orders of the defined types were placed in this  encounter.   Follow-Up Instructions: Return in about 5 months (around 11/10/2020) for Rheumatoid arthritis, Osteoarthritis.   Pollyann Savoy, MD  Note - This record has been created using Animal nutritionist.  Chart creation errors have been sought, but may not always  have been located. Such creation errors do not reflect on  the standard of medical care.

## 2020-06-06 ENCOUNTER — Other Ambulatory Visit: Payer: Self-pay | Admitting: *Deleted

## 2020-06-06 DIAGNOSIS — Z79899 Other long term (current) drug therapy: Secondary | ICD-10-CM

## 2020-06-07 LAB — CBC WITH DIFFERENTIAL/PLATELET
Absolute Monocytes: 421 cells/uL (ref 200–950)
Basophils Absolute: 48 cells/uL (ref 0–200)
Basophils Relative: 0.7 %
Eosinophils Absolute: 173 cells/uL (ref 15–500)
Eosinophils Relative: 2.5 %
HCT: 41.2 % (ref 35.0–45.0)
Hemoglobin: 13.8 g/dL (ref 11.7–15.5)
Lymphs Abs: 2132 cells/uL (ref 850–3900)
MCH: 30.3 pg (ref 27.0–33.0)
MCHC: 33.5 g/dL (ref 32.0–36.0)
MCV: 90.5 fL (ref 80.0–100.0)
MPV: 9.6 fL (ref 7.5–12.5)
Monocytes Relative: 6.1 %
Neutro Abs: 4126 cells/uL (ref 1500–7800)
Neutrophils Relative %: 59.8 %
Platelets: 336 10*3/uL (ref 140–400)
RBC: 4.55 10*6/uL (ref 3.80–5.10)
RDW: 13.1 % (ref 11.0–15.0)
Total Lymphocyte: 30.9 %
WBC: 6.9 10*3/uL (ref 3.8–10.8)

## 2020-06-07 LAB — COMPLETE METABOLIC PANEL WITH GFR
AG Ratio: 1.4 (calc) (ref 1.0–2.5)
ALT: 15 U/L (ref 6–29)
AST: 23 U/L (ref 10–35)
Albumin: 4.3 g/dL (ref 3.6–5.1)
Alkaline phosphatase (APISO): 34 U/L — ABNORMAL LOW (ref 37–153)
BUN: 23 mg/dL (ref 7–25)
CO2: 26 mmol/L (ref 20–32)
Calcium: 9.6 mg/dL (ref 8.6–10.4)
Chloride: 102 mmol/L (ref 98–110)
Creat: 0.88 mg/dL (ref 0.60–0.93)
GFR, Est African American: 76 mL/min/{1.73_m2} (ref >=60)
GFR, Est Non African American: 66 mL/min/{1.73_m2} (ref >=60)
Globulin: 3.1 g/dL (calc) (ref 1.9–3.7)
Glucose, Bld: 121 mg/dL — ABNORMAL HIGH (ref 65–99)
Potassium: 4.5 mmol/L (ref 3.5–5.3)
Sodium: 138 mmol/L (ref 135–146)
Total Bilirubin: 0.4 mg/dL (ref 0.2–1.2)
Total Protein: 7.4 g/dL (ref 6.1–8.1)

## 2020-06-07 NOTE — Progress Notes (Signed)
CBC is normal.  Glucose is mildly elevated, probably not a fasting sample.

## 2020-06-13 ENCOUNTER — Encounter: Payer: Self-pay | Admitting: Rheumatology

## 2020-06-13 ENCOUNTER — Ambulatory Visit: Payer: PPO | Admitting: Rheumatology

## 2020-06-13 ENCOUNTER — Other Ambulatory Visit: Payer: Self-pay

## 2020-06-13 VITALS — BP 112/74 | HR 83 | Resp 16 | Ht 64.0 in | Wt 208.0 lb

## 2020-06-13 DIAGNOSIS — E785 Hyperlipidemia, unspecified: Secondary | ICD-10-CM

## 2020-06-13 DIAGNOSIS — M19071 Primary osteoarthritis, right ankle and foot: Secondary | ICD-10-CM

## 2020-06-13 DIAGNOSIS — Z79899 Other long term (current) drug therapy: Secondary | ICD-10-CM | POA: Diagnosis not present

## 2020-06-13 DIAGNOSIS — Z789 Other specified health status: Secondary | ICD-10-CM | POA: Diagnosis not present

## 2020-06-13 DIAGNOSIS — Z720 Tobacco use: Secondary | ICD-10-CM

## 2020-06-13 DIAGNOSIS — R748 Abnormal levels of other serum enzymes: Secondary | ICD-10-CM

## 2020-06-13 DIAGNOSIS — M0609 Rheumatoid arthritis without rheumatoid factor, multiple sites: Secondary | ICD-10-CM

## 2020-06-13 DIAGNOSIS — M8589 Other specified disorders of bone density and structure, multiple sites: Secondary | ICD-10-CM

## 2020-06-13 DIAGNOSIS — E559 Vitamin D deficiency, unspecified: Secondary | ICD-10-CM | POA: Diagnosis not present

## 2020-06-13 DIAGNOSIS — M19072 Primary osteoarthritis, left ankle and foot: Secondary | ICD-10-CM

## 2020-06-13 DIAGNOSIS — M791 Myalgia, unspecified site: Secondary | ICD-10-CM | POA: Diagnosis not present

## 2020-06-13 DIAGNOSIS — E1159 Type 2 diabetes mellitus with other circulatory complications: Secondary | ICD-10-CM | POA: Diagnosis not present

## 2020-06-13 DIAGNOSIS — M19042 Primary osteoarthritis, left hand: Secondary | ICD-10-CM

## 2020-06-13 DIAGNOSIS — R5383 Other fatigue: Secondary | ICD-10-CM | POA: Diagnosis not present

## 2020-06-13 DIAGNOSIS — Z96653 Presence of artificial knee joint, bilateral: Secondary | ICD-10-CM

## 2020-06-13 DIAGNOSIS — E1136 Type 2 diabetes mellitus with diabetic cataract: Secondary | ICD-10-CM

## 2020-06-13 DIAGNOSIS — T466X5A Adverse effect of antihyperlipidemic and antiarteriosclerotic drugs, initial encounter: Secondary | ICD-10-CM

## 2020-06-13 DIAGNOSIS — M19041 Primary osteoarthritis, right hand: Secondary | ICD-10-CM | POA: Diagnosis not present

## 2020-06-13 DIAGNOSIS — Z8709 Personal history of other diseases of the respiratory system: Secondary | ICD-10-CM

## 2020-06-13 DIAGNOSIS — Z7189 Other specified counseling: Secondary | ICD-10-CM

## 2020-06-13 DIAGNOSIS — E669 Obesity, unspecified: Secondary | ICD-10-CM

## 2020-06-13 DIAGNOSIS — E1169 Type 2 diabetes mellitus with other specified complication: Secondary | ICD-10-CM

## 2020-06-13 DIAGNOSIS — I152 Hypertension secondary to endocrine disorders: Secondary | ICD-10-CM

## 2020-06-13 DIAGNOSIS — E118 Type 2 diabetes mellitus with unspecified complications: Secondary | ICD-10-CM

## 2020-06-13 DIAGNOSIS — H47013 Ischemic optic neuropathy, bilateral: Secondary | ICD-10-CM

## 2020-06-13 DIAGNOSIS — M25432 Effusion, left wrist: Secondary | ICD-10-CM

## 2020-06-13 NOTE — Patient Instructions (Addendum)
Standing Labs We placed an order today for your standing lab work.   Please have your standing labs drawn in May and every 3 months  If possible, please have your labs drawn 2 weeks prior to your appointment so that the provider can discuss your results at your appointment.  We have open lab daily Monday through Thursday from 1:30-4:30 PM and Friday from 1:30-4:00 PM at the office of Dr. Pollyann Savoy, [facility].   Please be advised, all patients with office appointments requiring lab work will take precedents over walk-in lab work.  If possible, please come for your lab work on Monday and Friday afternoons, as you may experience shorter wait times. The office is located at [address], [address], [place], [place] [address] No appointment is necessary.   Labs are drawn by [facility]. Please bring your co-pay at the time of your lab draw.  You may receive a bill from [facility] for your lab work.  If you wish to have your labs drawn at another location, please call the office 24 hours in advance to send orders.  If you have any questions regarding directions or hours of operation,  please call [phone].   As a reminder, please drink plenty of water prior to coming for your lab work. Thanks!   COVID-19 vaccine recommendations:   COVID-19 vaccine is recommended for everyone (unless you are allergic to a vaccine component), even if you are on a medication that suppresses your immune system.   If you are on Methotrexate, Cellcept (mycophenolate), Rinvoq, Harriette Ohara, and Olumiant- hold the medication for 1 week after each vaccine. Hold Methotrexate for 2 weeks after the single dose COVID-19 vaccine.   Individual saw immunosuppressive therapy should receive Covid vaccine total 3 doses 1 month apart and then a booster 6 months later.  Do not take Tylenol or any anti-inflammatory medications (NSAIDs) 24 hours prior to the COVID-19 vaccination.   There is no direct evidence about  the efficacy of the COVID-19 vaccine in individuals who are on medications that suppress the immune system.   Even if you are fully vaccinated, and you are on any medications that suppress your immune system, please continue to wear a mask, maintain at least six feet social distance and practice hand hygiene.   If you develop a COVID-19 infection, please contact your PCP or our office to determine if you need monoclonal antibody infusion.  The booster vaccine is now available for immunocompromised patients.   Please see the following web sites for updated information.   [email or web address]/[email or web address]-Patient-Resources.[email or web address]   Heart Disease Prevention   Your inflammatory disease increases your risk of heart disease which includes heart attack, stroke, atrial fibrillation (irregular heartbeats), high blood pressure, heart failure and atherosclerosis (plaque in the arteries).  It is important to reduce your risk by:   . Keep blood pressure, cholesterol, and blood sugar at healthy levels   . Smoking Cessation   . Maintain a healthy weight  o BMI 20-25   . Eat a healthy diet  o Plenty of fresh fruit, vegetables, and whole grains  o Limit saturated fats, foods high in sodium, and added sugars  o DASH and Mediterranean diet   . Increase physical activity  o Recommend moderate physically activity for 150 minutes per week/ 30 minutes a day for five days a week These can be broken up into three separate ten-minute sessions during the day.   . Reduce Stress  . Meditation, slow breathing exercises, yoga, coloring books  .  Dental visits twice a year

## 2020-07-15 DIAGNOSIS — M25562 Pain in left knee: Secondary | ICD-10-CM | POA: Diagnosis not present

## 2020-08-19 DIAGNOSIS — Z96653 Presence of artificial knee joint, bilateral: Secondary | ICD-10-CM | POA: Diagnosis not present

## 2020-08-19 DIAGNOSIS — M25562 Pain in left knee: Secondary | ICD-10-CM | POA: Diagnosis not present

## 2020-08-23 DIAGNOSIS — E119 Type 2 diabetes mellitus without complications: Secondary | ICD-10-CM | POA: Diagnosis not present

## 2020-08-23 DIAGNOSIS — H47013 Ischemic optic neuropathy, bilateral: Secondary | ICD-10-CM | POA: Diagnosis not present

## 2020-08-23 DIAGNOSIS — H25813 Combined forms of age-related cataract, bilateral: Secondary | ICD-10-CM | POA: Diagnosis not present

## 2020-08-23 LAB — HM DIABETES EYE EXAM

## 2020-08-26 DIAGNOSIS — M545 Low back pain, unspecified: Secondary | ICD-10-CM | POA: Diagnosis not present

## 2020-09-02 ENCOUNTER — Other Ambulatory Visit: Payer: Self-pay | Admitting: Orthopaedic Surgery

## 2020-09-02 DIAGNOSIS — M545 Low back pain, unspecified: Secondary | ICD-10-CM

## 2020-09-05 ENCOUNTER — Other Ambulatory Visit: Payer: Self-pay

## 2020-09-05 DIAGNOSIS — Z79899 Other long term (current) drug therapy: Secondary | ICD-10-CM

## 2020-09-06 LAB — COMPLETE METABOLIC PANEL WITH GFR
AG Ratio: 1.4 (calc) (ref 1.0–2.5)
ALT: 19 U/L (ref 6–29)
AST: 26 U/L (ref 10–35)
Albumin: 4.2 g/dL (ref 3.6–5.1)
Alkaline phosphatase (APISO): 44 U/L (ref 37–153)
BUN: 23 mg/dL (ref 7–25)
CO2: 26 mmol/L (ref 20–32)
Calcium: 9.6 mg/dL (ref 8.6–10.4)
Chloride: 102 mmol/L (ref 98–110)
Creat: 0.91 mg/dL (ref 0.60–0.93)
GFR, Est African American: 73 mL/min/{1.73_m2} (ref >=60)
GFR, Est Non African American: 63 mL/min/{1.73_m2} (ref >=60)
Globulin: 3.1 g/dL (calc) (ref 1.9–3.7)
Glucose, Bld: 136 mg/dL — ABNORMAL HIGH (ref 65–99)
Potassium: 4.3 mmol/L (ref 3.5–5.3)
Sodium: 137 mmol/L (ref 135–146)
Total Bilirubin: 0.4 mg/dL (ref 0.2–1.2)
Total Protein: 7.3 g/dL (ref 6.1–8.1)

## 2020-09-06 LAB — CBC WITH DIFFERENTIAL/PLATELET
Absolute Monocytes: 351 cells/uL (ref 200–950)
Basophils Absolute: 72 cells/uL (ref 0–200)
Basophils Relative: 1.1 %
Eosinophils Absolute: 247 cells/uL (ref 15–500)
Eosinophils Relative: 3.8 %
HCT: 39.4 % (ref 35.0–45.0)
Hemoglobin: 13 g/dL (ref 11.7–15.5)
Lymphs Abs: 1918 cells/uL (ref 850–3900)
MCH: 30 pg (ref 27.0–33.0)
MCHC: 33 g/dL (ref 32.0–36.0)
MCV: 90.8 fL (ref 80.0–100.0)
MPV: 9.6 fL (ref 7.5–12.5)
Monocytes Relative: 5.4 %
Neutro Abs: 3913 cells/uL (ref 1500–7800)
Neutrophils Relative %: 60.2 %
Platelets: 324 10*3/uL (ref 140–400)
RBC: 4.34 10*6/uL (ref 3.80–5.10)
RDW: 13.3 % (ref 11.0–15.0)
Total Lymphocyte: 29.5 %
WBC: 6.5 10*3/uL (ref 3.8–10.8)

## 2020-09-11 ENCOUNTER — Other Ambulatory Visit: Payer: Self-pay | Admitting: Rheumatology

## 2020-09-12 NOTE — Telephone Encounter (Signed)
Next Visit: 11/14/2020  Last Visit: 06/13/2020  Last Fill: 04/04/2020  DX:  Rheumatoid arthritis of multiple sites with negative rheumatoid factor  Current Dose per office note 06/13/2020, methotrexate 0.7 mL sq injections once weekly   Labs: 09/05/2020, Glucose is 136. Rest of CMP WNL. CBC WNL.   Okay to refill MTX?

## 2020-09-17 ENCOUNTER — Other Ambulatory Visit (HOSPITAL_BASED_OUTPATIENT_CLINIC_OR_DEPARTMENT_OTHER): Payer: Self-pay | Admitting: Orthopaedic Surgery

## 2020-09-17 DIAGNOSIS — M545 Low back pain, unspecified: Secondary | ICD-10-CM

## 2020-09-21 ENCOUNTER — Ambulatory Visit (HOSPITAL_BASED_OUTPATIENT_CLINIC_OR_DEPARTMENT_OTHER)
Admission: RE | Admit: 2020-09-21 | Discharge: 2020-09-21 | Disposition: A | Discharge status: Home / Self Care | Payer: PPO | Source: Ambulatory Visit | Attending: Orthopaedic Surgery | Admitting: Orthopaedic Surgery

## 2020-09-21 ENCOUNTER — Other Ambulatory Visit: Payer: Self-pay

## 2020-09-21 DIAGNOSIS — M545 Low back pain, unspecified: Secondary | ICD-10-CM | POA: Diagnosis not present

## 2020-10-01 DIAGNOSIS — M545 Low back pain, unspecified: Secondary | ICD-10-CM | POA: Diagnosis not present

## 2020-10-07 ENCOUNTER — Other Ambulatory Visit: Payer: Self-pay | Admitting: Orthopedic Surgery

## 2020-10-08 ENCOUNTER — Other Ambulatory Visit: Payer: Self-pay | Admitting: Family Medicine

## 2020-10-11 NOTE — Pre-Procedure Instructions (Signed)
Surgical Instructions:    Your procedure is scheduled on Wednesday, June 15th.  Report to [facility] Main Entrance "A" at 11:50 A.M., then check in with the Admitting office.  Call this number if you have any questions prior to, or have any problems the morning of surgery:  [phone]    Remember:  Do not eat after midnight the night before your surgery.  You may drink clear liquids until 11:50 AM the morning of your surgery.   Clear liquids allowed are: Water, Non-Citrus Juices (without pulp), Carbonated Beverages, Clear Tea, Black Coffee Only, and Gatorade.  Please complete your PRE-SURGERY 10oz Bottled Water that was provided to you by 11:50 AM the morning of surgery.  Please, if able, drink it in one setting. DO NOT SIP.      Take these medicines the morning of surgery with A SIP OF WATER: fenofibrate  pravastatin (PRAVACHOL)   *Follow your surgeon's instructions on when to stop Aspirin.  If no instructions were given by your surgeon then you will need to call the office to get those instructions.     As of today, STOP taking any Aleve, Naproxen, Ibuprofen, Motrin, Advil, Goody's, BC's, all herbal medications, fish oil, and all vitamins.            WHAT DO I DO ABOUT MY DIABETES MEDICATION?` Do not take oral diabetes medicines (pills)--metFORMIN (GLUCOPHAGE) OR sitaGLIPtin (JANUVIA) the morning of surgery.    HOW TO MANAGE YOUR DIABETES BEFORE AND AFTER SURGERY  Why is it important to control my blood sugar before and after surgery? Improving blood sugar levels before and after surgery helps healing and can limit problems. A way of improving blood sugar control is eating a healthy diet by:  Eating less sugar and carbohydrates  Increasing activity/exercise  Talking with your doctor about reaching your blood sugar goals High blood sugars (greater than 180 mg/dL) can raise your risk of infections and slow your recovery, so you will need to focus on controlling your  diabetes during the weeks before surgery. Make sure that the doctor who takes care of your diabetes knows about your planned surgery including the date and location.  How do I manage my blood sugar before surgery? Check your blood sugar at least 4 times a day, starting 2 days before surgery, to make sure that the level is not too high or low.  Check your blood sugar the morning of your surgery when you wake up and every 2 hours until you get to the Short Stay unit.  If your blood sugar is less than 70 mg/dL, you will need to treat for low blood sugar: Treat a low blood sugar (less than 70 mg/dL) with  cup of clear juice (cranberry or apple), 4 glucose tablets, OR glucose gel. Recheck blood sugar in 15 minutes after treatment (to make sure it is greater than 70 mg/dL). If your blood sugar is not greater than 70 mg/dL on recheck, call [phone] for further instructions. Report your blood sugar to the short stay nurse when you get to Short Stay.  If you are admitted to the hospital after surgery: Your blood sugar will be checked by the staff and you will probably be given insulin after surgery (instead of oral diabetes medicines) to make sure you have good blood sugar levels. The goal for blood sugar control after surgery is 80-180 mg/dL.    Special instructions:   [place]- Preparing For Surgery  Before surgery, you can play an important  role. Because skin is not sterile, your skin needs to be as free of germs as possible. You can reduce the number of germs on your skin by washing with CHG (chlorahexidine gluconate) Soap before surgery.  CHG is an antiseptic cleaner which kills germs and bonds with the skin to continue killing germs even after washing.    Oral Hygiene is also important to reduce your risk of infection.  Remember - BRUSH YOUR TEETH THE MORNING OF SURGERY WITH YOUR REGULAR TOOTHPASTE  Please do not use if you have an allergy to CHG or antibacterial soaps. If your skin  becomes reddened/irritated stop using the CHG.  Do not shave (including legs and underarms) for at least 48 hours prior to first CHG shower. It is OK to shave your face.  Please follow these instructions carefully.   Shower the NIGHT BEFORE SURGERY and the MORNING OF SURGERY  If you chose to wash your hair, wash your hair first as usual with your normal shampoo.  After you shampoo, rinse your hair and body thoroughly to remove the shampoo.  Wash Face and genitals (private parts) with your normal soap.   Use CHG Soap as you would any other liquid soap. You can apply CHG directly to the skin and wash gently with a scrungie or a clean washcloth.   Apply the CHG Soap to your body ONLY FROM THE NECK DOWN.  Do not use on open wounds or open sores. Avoid contact with your eyes, ears, mouth and genitals (private parts). Wash Face and genitals (private parts)  with your normal soap.   Wash thoroughly, paying special attention to the area where your surgery will be performed.  Thoroughly rinse your body with warm water from the neck down.  DO NOT shower/wash with your normal soap after using and rinsing off the CHG Soap.  Pat yourself dry with a CLEAN TOWEL.  Wear CLEAN PAJAMAS to bed the night before surgery.  Place CLEAN SHEETS on your bed the night before your surgery.  DO NOT SLEEP WITH PETS.   Day of Surgery: SHOWER with CHG soap. Brush your teeth WITH YOUR REGULAR TOOTHPASTE. Wear Clean/Comfortable clothing the morning of surgery. Do not apply any deodorants/lotions.   Do not wear jewelry, make up, or nail polish. Do not shave 48 hours prior to surgery.   Do not bring valuables to the hospital. [facility] is not responsible for any belongings or valuables.  Do NOT Smoke (Tobacco/Vaping) or drink Alcohol 24 hours prior to your procedure.  If you use a CPAP at night, you may bring all equipment for your overnight stay.   Contacts, glasses, or dentures may not be worn into  surgery, please bring cases for these belongings.   For patients admitted to the hospital, discharge time will be determined by your treatment team.   Patients discharged the day of surgery will not be allowed to drive home, and someone needs to stay with them for 24 hours.    Please read over the following fact sheets that you were given.

## 2020-10-14 ENCOUNTER — Ambulatory Visit (HOSPITAL_COMMUNITY)
Admission: RE | Admit: 2020-10-14 | Discharge: 2020-10-14 | Disposition: A | Discharge status: Home / Self Care | Payer: PPO | Source: Ambulatory Visit | Attending: Orthopedic Surgery | Admitting: Orthopedic Surgery

## 2020-10-14 ENCOUNTER — Other Ambulatory Visit: Payer: Self-pay

## 2020-10-14 ENCOUNTER — Encounter (HOSPITAL_COMMUNITY): Payer: Self-pay

## 2020-10-14 DIAGNOSIS — Z20822 Contact with and (suspected) exposure to covid-19: Secondary | ICD-10-CM | POA: Diagnosis not present

## 2020-10-14 DIAGNOSIS — Z01812 Encounter for preprocedural laboratory examination: Secondary | ICD-10-CM | POA: Diagnosis not present

## 2020-10-14 HISTORY — DX: Nausea with vomiting, unspecified: R11.2

## 2020-10-14 HISTORY — DX: Nausea with vomiting, unspecified: Z98.890

## 2020-10-14 LAB — COMPREHENSIVE METABOLIC PANEL
ALT: 19 U/L (ref 0–44)
AST: 25 U/L (ref 15–41)
Albumin: 4 g/dL (ref 3.5–5.0)
Alkaline Phosphatase: 33 U/L — ABNORMAL LOW (ref 38–126)
Anion gap: 9 (ref 5–15)
BUN: 22 mg/dL (ref 8–23)
CO2: 23 mmol/L (ref 22–32)
Calcium: 9.3 mg/dL (ref 8.9–10.3)
Chloride: 105 mmol/L (ref 98–111)
Creatinine, Ser: 0.87 mg/dL (ref 0.44–1.00)
GFR, Estimated: >60 mL/min (ref >60)
Glucose, Bld: 118 mg/dL — ABNORMAL HIGH (ref 70–99)
Potassium: 4.2 mmol/L (ref 3.5–5.1)
Sodium: 137 mmol/L (ref 135–145)
Total Bilirubin: 0.2 mg/dL — ABNORMAL LOW (ref 0.3–1.2)
Total Protein: 7.8 g/dL (ref 6.5–8.1)

## 2020-10-14 LAB — CBC WITH DIFFERENTIAL/PLATELET
Abs Immature Granulocytes: 0.02 10*3/uL (ref 0.00–0.07)
Basophils Absolute: 0.1 10*3/uL (ref 0.0–0.1)
Basophils Relative: 1 %
Eosinophils Absolute: 0.2 10*3/uL (ref 0.0–0.5)
Eosinophils Relative: 2 %
HCT: 43.1 % (ref 36.0–46.0)
Hemoglobin: 14.1 g/dL (ref 12.0–15.0)
Immature Granulocytes: 0 %
Lymphocytes Relative: 28 %
Lymphs Abs: 2.1 10*3/uL (ref 0.7–4.0)
MCH: 30.2 pg (ref 26.0–34.0)
MCHC: 32.7 g/dL (ref 30.0–36.0)
MCV: 92.3 fL (ref 80.0–100.0)
Monocytes Absolute: 0.5 10*3/uL (ref 0.1–1.0)
Monocytes Relative: 7 %
Neutro Abs: 4.5 10*3/uL (ref 1.7–7.7)
Neutrophils Relative %: 62 %
Platelets: 267 10*3/uL (ref 150–400)
RBC: 4.67 MIL/uL (ref 3.87–5.11)
RDW: 13.6 % (ref 11.5–15.5)
WBC: 7.4 10*3/uL (ref 4.0–10.5)
nRBC: 0 % (ref 0.0–0.2)

## 2020-10-14 LAB — APTT: aPTT: 29 seconds (ref 24–36)

## 2020-10-14 LAB — URINALYSIS, ROUTINE W REFLEX MICROSCOPIC
Bilirubin Urine: NEGATIVE
Glucose, UA: NEGATIVE mg/dL
Hgb urine dipstick: NEGATIVE
Ketones, ur: NEGATIVE mg/dL
Leukocytes,Ua: NEGATIVE
Nitrite: NEGATIVE
Protein, ur: NEGATIVE mg/dL
Specific Gravity, Urine: 1.02 (ref 1.005–1.030)
pH: 5 (ref 5.0–8.0)

## 2020-10-14 LAB — SURGICAL PCR SCREEN
MRSA, PCR: NEGATIVE
Staphylococcus aureus: NEGATIVE

## 2020-10-14 LAB — TYPE AND SCREEN
ABO/RH(D): AB POS
Antibody Screen: NEGATIVE

## 2020-10-14 LAB — GLUCOSE, CAPILLARY: Glucose-Capillary: 137 mg/dL — ABNORMAL HIGH (ref 70–99)

## 2020-10-14 LAB — PROTIME-INR
INR: 1 (ref 0.8–1.2)
Prothrombin Time: 13.7 seconds (ref 11.4–15.2)

## 2020-10-14 LAB — SARS CORONAVIRUS 2 (TAT 6-24 HRS): SARS Coronavirus 2: NEGATIVE

## 2020-10-14 NOTE — Progress Notes (Signed)
PCP - Dr. Felix Pacini Cardiologist - denies  Chest x-ray - n/a EKG - 10/14/20 Stress Test - denies ECHO - 04/01/10 Cardiac Cath - 03/2315  Sleep Study - denies CPAP - denies  Fasting Blood Sugar - 100-120 Checks Blood Sugar 2 times a week CBG at PAT: 137 Last A1C per pt was 7.6, 3 months ago. Will collect A1C today.  Blood Thinner Instructions: n/a Aspirin Instructions: LD 10/09/20; holding for 7 days.  ERAS Protcol -clar liquids until 11:50 DOS. PRE-SURGERY Ensure or G2- pt given (1) 10 oz bottle of water.  COVID TEST- 10/14/20 in PAT  Anesthesia review: Yes, heart history.  Patient denies shortness of breath, fever, cough and chest pain at PAT appointment   All instructions explained to the patient, with a verbal understanding of the material. Patient agrees to go over the instructions while at home for a better understanding. Patient also instructed to self quarantine after being tested for COVID-19. The opportunity to ask questions was provided.

## 2020-10-16 ENCOUNTER — Ambulatory Visit (HOSPITAL_COMMUNITY)
Admission: RE | Admit: 2020-10-16 | Discharge: 2020-10-16 | Disposition: A | Discharge status: Home / Self Care | Payer: PPO | Attending: Orthopedic Surgery | Admitting: Orthopedic Surgery

## 2020-10-16 ENCOUNTER — Ambulatory Visit (HOSPITAL_COMMUNITY): Payer: PPO

## 2020-10-16 ENCOUNTER — Encounter (HOSPITAL_COMMUNITY)
Admission: RE | Disposition: A | Discharge status: Home / Self Care | Payer: Self-pay | Source: Home / Self Care | Attending: Orthopedic Surgery

## 2020-10-16 ENCOUNTER — Encounter (HOSPITAL_COMMUNITY): Payer: Self-pay | Admitting: Orthopedic Surgery

## 2020-10-16 ENCOUNTER — Other Ambulatory Visit: Payer: Self-pay

## 2020-10-16 ENCOUNTER — Ambulatory Visit (HOSPITAL_COMMUNITY): Payer: PPO | Admitting: Physician Assistant

## 2020-10-16 DIAGNOSIS — Z981 Arthrodesis status: Secondary | ICD-10-CM | POA: Diagnosis not present

## 2020-10-16 DIAGNOSIS — M8088XA Other osteoporosis with current pathological fracture, vertebra(e), initial encounter for fracture: Secondary | ICD-10-CM | POA: Diagnosis not present

## 2020-10-16 DIAGNOSIS — Z7982 Long term (current) use of aspirin: Secondary | ICD-10-CM | POA: Insufficient documentation

## 2020-10-16 DIAGNOSIS — Z79899 Other long term (current) drug therapy: Secondary | ICD-10-CM | POA: Insufficient documentation

## 2020-10-16 DIAGNOSIS — M8008XA Age-related osteoporosis with current pathological fracture, vertebra(e), initial encounter for fracture: Secondary | ICD-10-CM | POA: Diagnosis not present

## 2020-10-16 DIAGNOSIS — M4856XA Collapsed vertebra, not elsewhere classified, lumbar region, initial encounter for fracture: Secondary | ICD-10-CM | POA: Diagnosis not present

## 2020-10-16 DIAGNOSIS — Z888 Allergy status to other drugs, medicaments and biological substances status: Secondary | ICD-10-CM | POA: Diagnosis not present

## 2020-10-16 DIAGNOSIS — F17210 Nicotine dependence, cigarettes, uncomplicated: Secondary | ICD-10-CM | POA: Diagnosis not present

## 2020-10-16 DIAGNOSIS — Z96653 Presence of artificial knee joint, bilateral: Secondary | ICD-10-CM | POA: Diagnosis not present

## 2020-10-16 DIAGNOSIS — J449 Chronic obstructive pulmonary disease, unspecified: Secondary | ICD-10-CM | POA: Diagnosis not present

## 2020-10-16 DIAGNOSIS — Z7984 Long term (current) use of oral hypoglycemic drugs: Secondary | ICD-10-CM | POA: Insufficient documentation

## 2020-10-16 DIAGNOSIS — Z419 Encounter for procedure for purposes other than remedying health state, unspecified: Secondary | ICD-10-CM

## 2020-10-16 DIAGNOSIS — E785 Hyperlipidemia, unspecified: Secondary | ICD-10-CM | POA: Diagnosis not present

## 2020-10-16 DIAGNOSIS — E118 Type 2 diabetes mellitus with unspecified complications: Secondary | ICD-10-CM | POA: Diagnosis not present

## 2020-10-16 HISTORY — PX: KYPHOPLASTY: SHX5884

## 2020-10-16 HISTORY — PX: FIXATION KYPHOPLASTY LUMBAR SPINE: SHX1642

## 2020-10-16 LAB — GLUCOSE, CAPILLARY
Glucose-Capillary: 139 mg/dL — ABNORMAL HIGH (ref 70–99)
Glucose-Capillary: 114 mg/dL — ABNORMAL HIGH (ref 70–99)
Glucose-Capillary: 116 mg/dL — ABNORMAL HIGH (ref 70–99)
Glucose-Capillary: 146 mg/dL — ABNORMAL HIGH (ref 70–99)

## 2020-10-16 LAB — ABO/RH: ABO/RH(D): AB POS

## 2020-10-16 SURGERY — KYPHOPLASTY
Anesthesia: General

## 2020-10-16 MED ORDER — ROCURONIUM BROMIDE 10 MG/ML (PF) SYRINGE
PREFILLED_SYRINGE | INTRAVENOUS | Status: DC | PRN
  Administered 2020-10-16: 60 mg via INTRAVENOUS

## 2020-10-16 MED ORDER — POVIDONE-IODINE 7.5 % EX SOLN
Freq: Once | CUTANEOUS | Status: DC
  Filled 2020-10-16: qty 118

## 2020-10-16 MED ORDER — PHENYLEPHRINE HCL (PRESSORS) 10 MG/ML IV SOLN
INTRAVENOUS | Status: DC | PRN
  Administered 2020-10-16: 80 ug via INTRAVENOUS

## 2020-10-16 MED ORDER — LIDOCAINE HCL (PF) 2 % IJ SOLN
INTRAMUSCULAR | Status: AC
  Filled 2020-10-16: qty 5

## 2020-10-16 MED ORDER — FENTANYL CITRATE (PF) 100 MCG/2ML IJ SOLN
25.0000 ug | INTRAMUSCULAR | Status: DC | PRN
  Administered 2020-10-16: 25 ug via INTRAVENOUS
  Administered 2020-10-16: 50 ug via INTRAVENOUS

## 2020-10-16 MED ORDER — BACITRACIN ZINC 500 UNIT/GM EX OINT
TOPICAL_OINTMENT | CUTANEOUS | Status: AC
  Filled 2020-10-16: qty 28.35

## 2020-10-16 MED ORDER — FENTANYL CITRATE (PF) 250 MCG/5ML IJ SOLN
INTRAMUSCULAR | Status: AC
  Filled 2020-10-16: qty 5

## 2020-10-16 MED ORDER — DEXAMETHASONE SODIUM PHOSPHATE 10 MG/ML IJ SOLN
INTRAMUSCULAR | Status: DC | PRN
  Administered 2020-10-16: 5 mg via INTRAVENOUS

## 2020-10-16 MED ORDER — LACTATED RINGERS IV SOLN: INTRAVENOUS | Status: DC

## 2020-10-16 MED ORDER — 0.9 % SODIUM CHLORIDE (POUR BTL) OPTIME
TOPICAL | Status: DC | PRN
  Administered 2020-10-16: 1000 mL

## 2020-10-16 MED ORDER — BUPIVACAINE-EPINEPHRINE (PF) 0.25% -1:200000 IJ SOLN
INTRAMUSCULAR | Status: AC
  Filled 2020-10-16: qty 30

## 2020-10-16 MED ORDER — ROCURONIUM BROMIDE 10 MG/ML (PF) SYRINGE
PREFILLED_SYRINGE | INTRAVENOUS | Status: AC
  Filled 2020-10-16: qty 10

## 2020-10-16 MED ORDER — OXYCODONE HCL 5 MG PO TABS: 5.0000 mg | ORAL_TABLET | Freq: Once | ORAL | Status: DC | PRN

## 2020-10-16 MED ORDER — DIPHENHYDRAMINE HCL 50 MG/ML IJ SOLN
INTRAMUSCULAR | Status: AC
  Filled 2020-10-16: qty 1

## 2020-10-16 MED ORDER — ONDANSETRON HCL 4 MG/2ML IJ SOLN
INTRAMUSCULAR | Status: DC | PRN
  Administered 2020-10-16: 4 mg via INTRAVENOUS

## 2020-10-16 MED ORDER — BACITRACIN 500 UNIT/GM EX OINT
TOPICAL_OINTMENT | CUTANEOUS | Status: DC | PRN
  Administered 2020-10-16: 1 via TOPICAL

## 2020-10-16 MED ORDER — DIPHENHYDRAMINE HCL 50 MG/ML IJ SOLN
INTRAMUSCULAR | Status: DC | PRN
  Administered 2020-10-16: 12.5 mg via INTRAVENOUS

## 2020-10-16 MED ORDER — CEFAZOLIN SODIUM-DEXTROSE 2-4 GM/100ML-% IV SOLN
2.0000 g | INTRAVENOUS | Status: AC
  Administered 2020-10-16: 2 g via INTRAVENOUS

## 2020-10-16 MED ORDER — ONDANSETRON HCL 4 MG/2ML IJ SOLN: 4.0000 mg | Freq: Once | INTRAMUSCULAR | Status: DC | PRN

## 2020-10-16 MED ORDER — MIDAZOLAM HCL 2 MG/2ML IJ SOLN
INTRAMUSCULAR | Status: DC | PRN
  Administered 2020-10-16: 2 mg via INTRAVENOUS

## 2020-10-16 MED ORDER — SUGAMMADEX SODIUM 200 MG/2ML IV SOLN
INTRAVENOUS | Status: DC | PRN
  Administered 2020-10-16: 200 mg via INTRAVENOUS

## 2020-10-16 MED ORDER — IOPAMIDOL (ISOVUE-200) INJECTION 41%
10.0000 mL | Freq: Once | INTRAVENOUS | Status: DC | PRN
  Filled 2020-10-16: qty 50

## 2020-10-16 MED ORDER — BUPIVACAINE-EPINEPHRINE (PF) 0.25% -1:200000 IJ SOLN: INTRAMUSCULAR | Status: DC | PRN

## 2020-10-16 MED ORDER — PROPOFOL 10 MG/ML IV BOLUS
INTRAVENOUS | Status: DC | PRN
  Administered 2020-10-16: 130 mg via INTRAVENOUS

## 2020-10-16 MED ORDER — FENTANYL CITRATE (PF) 250 MCG/5ML IJ SOLN
INTRAMUSCULAR | Status: DC | PRN
  Administered 2020-10-16: 100 ug via INTRAVENOUS
  Administered 2020-10-16 (×2): 25 ug via INTRAVENOUS

## 2020-10-16 MED ORDER — ORAL CARE MOUTH RINSE: 15.0000 mL | Freq: Once | OROMUCOSAL | Status: AC

## 2020-10-16 MED ORDER — DEXAMETHASONE SODIUM PHOSPHATE 10 MG/ML IJ SOLN
INTRAMUSCULAR | Status: AC
  Filled 2020-10-16: qty 1

## 2020-10-16 MED ORDER — HYDROCODONE-ACETAMINOPHEN 5-325 MG PO TABS: 1.0000 | ORAL_TABLET | Freq: Four times a day (QID) | ORAL | 0 refills | Status: DC | PRN

## 2020-10-16 MED ORDER — OXYCODONE HCL 5 MG/5ML PO SOLN: 5.0000 mg | Freq: Once | ORAL | Status: DC | PRN

## 2020-10-16 MED ORDER — IOPAMIDOL (ISOVUE-300) INJECTION 61%
INTRAVENOUS | Status: DC | PRN
  Administered 2020-10-16: 50 mL

## 2020-10-16 MED ORDER — METHOCARBAMOL 500 MG PO TABS: 500.0000 mg | ORAL_TABLET | Freq: Four times a day (QID) | ORAL | 0 refills | Status: DC | PRN

## 2020-10-16 MED ORDER — CEFAZOLIN SODIUM-DEXTROSE 2-4 GM/100ML-% IV SOLN
INTRAVENOUS | Status: AC
  Filled 2020-10-16: qty 100

## 2020-10-16 MED ORDER — LIDOCAINE 2% (20 MG/ML) 5 ML SYRINGE
INTRAMUSCULAR | Status: DC | PRN
  Administered 2020-10-16: 40 mg via INTRAVENOUS

## 2020-10-16 MED ORDER — ONDANSETRON HCL 4 MG/2ML IJ SOLN
INTRAMUSCULAR | Status: AC
  Filled 2020-10-16: qty 2

## 2020-10-16 MED ORDER — CHLORHEXIDINE GLUCONATE 0.12 % MT SOLN: 15.0000 mL | Freq: Once | OROMUCOSAL | Status: AC

## 2020-10-16 MED ORDER — CHLORHEXIDINE GLUCONATE 0.12 % MT SOLN
OROMUCOSAL | Status: AC
  Administered 2020-10-16: 15 mL via OROMUCOSAL
  Filled 2020-10-16: qty 15

## 2020-10-16 MED ORDER — MIDAZOLAM HCL 2 MG/2ML IJ SOLN
INTRAMUSCULAR | Status: AC
  Filled 2020-10-16: qty 2

## 2020-10-16 MED ORDER — FENTANYL CITRATE (PF) 100 MCG/2ML IJ SOLN
INTRAMUSCULAR | Status: AC
  Filled 2020-10-16: qty 2

## 2020-10-16 SURGICAL SUPPLY — 44 items
BLADE SURG 15 STRL LF DISP TIS (BLADE) ×1 IMPLANT
BLADE SURG 15 STRL SS (BLADE) ×3
CEMENT BONE KYPHX HV R (Orthopedic Implant) ×3 IMPLANT
COVER MAYO STAND STRL (DRAPES) ×3 IMPLANT
COVER SURGICAL LIGHT HANDLE (MISCELLANEOUS) ×3 IMPLANT
COVER WAND RF STERILE (DRAPES) ×3 IMPLANT
CURETTE EXPRESS SZ2 7MM (INSTRUMENTS) ×1 IMPLANT
CURRETTE EXPRESS SZ2 7MM (INSTRUMENTS) ×3
DRAPE C-ARM 42X72 X-RAY (DRAPES) ×3 IMPLANT
DRAPE HALF SHEET 40X57 (DRAPES) ×3 IMPLANT
DRAPE INCISE IOBAN 66X45 STRL (DRAPES) ×3 IMPLANT
DRAPE LAPAROTOMY T 102X78X121 (DRAPES) ×3 IMPLANT
DRAPE SURG 17X23 STRL (DRAPES) ×12 IMPLANT
DRAPE WARM FLUID 44X44 (DRAPES) ×3 IMPLANT
DURAPREP 26ML APPLICATOR (WOUND CARE) ×3 IMPLANT
GAUZE 4X4 16PLY RFD (DISPOSABLE) ×3 IMPLANT
GAUZE SPONGE 2X2 8PLY NS (GAUZE/BANDAGES/DRESSINGS) ×2 IMPLANT
GAUZE SPONGE 2X2 8PLY STRL LF (GAUZE/BANDAGES/DRESSINGS) ×1 IMPLANT
GLOVE BIO SURGEON STRL SZ7 (GLOVE) ×3 IMPLANT
GLOVE BIO SURGEON STRL SZ8 (GLOVE) ×3 IMPLANT
GLOVE SRG 8 PF TXTR STRL LF DI (GLOVE) ×1 IMPLANT
GLOVE SURG UNDER POLY LF SZ7 (GLOVE) ×3 IMPLANT
GLOVE SURG UNDER POLY LF SZ8 (GLOVE) ×3
GOWN STRL REUS W/ TWL LRG LVL3 (GOWN DISPOSABLE) ×2 IMPLANT
GOWN STRL REUS W/ TWL XL LVL3 (GOWN DISPOSABLE) ×1 IMPLANT
GOWN STRL REUS W/TWL LRG LVL3 (GOWN DISPOSABLE) ×6
GOWN STRL REUS W/TWL XL LVL3 (GOWN DISPOSABLE) ×3
KIT BASIN OR (CUSTOM PROCEDURE TRAY) ×3 IMPLANT
KIT TURNOVER KIT B (KITS) ×3 IMPLANT
NDL SPNL 18GX3.5 QUINCKE PK (NEEDLE) ×2 IMPLANT
NEEDLE 22X1 1/2 (OR ONLY) (NEEDLE) ×2 IMPLANT
NEEDLE HYPO 25X1 1.5 SAFETY (NEEDLE) ×3 IMPLANT
NEEDLE SPNL 18GX3.5 QUINCKE PK (NEEDLE) ×6 IMPLANT
NS IRRIG 1000ML POUR BTL (IV SOLUTION) ×3 IMPLANT
PACK UNIVERSAL I (CUSTOM PROCEDURE TRAY) ×3 IMPLANT
PAD ARMBOARD 7.5X6 YLW CONV (MISCELLANEOUS) ×6 IMPLANT
POSITIONER HEAD PRONE TRACH (MISCELLANEOUS) ×3 IMPLANT
SPONGE GAUZE 2X2 STER 10/PKG (GAUZE/BANDAGES/DRESSINGS) ×2
SUT MNCRL AB 4-0 PS2 18 (SUTURE) ×6 IMPLANT
SYR BULB IRRIG 60ML STRL (SYRINGE) ×3 IMPLANT
SYR CONTROL 10ML LL (SYRINGE) ×3 IMPLANT
TOWEL GREEN STERILE (TOWEL DISPOSABLE) ×3 IMPLANT
TOWEL GREEN STERILE FF (TOWEL DISPOSABLE) ×3 IMPLANT
TRAY KYPHOPAK 20/3 ONESTEP 1ST (MISCELLANEOUS) ×6 IMPLANT

## 2020-10-16 NOTE — Anesthesia Preprocedure Evaluation (Signed)
Anesthesia Evaluation  Patient identified by MRN, date of birth, ID band Patient awake    Reviewed: Allergy & Precautions, NPO status , Patient's Chart, lab work & pertinent test results  Airway Mallampati: II  TM Distance: >3 FB Neck ROM: Full    Dental  (+) Teeth Intact, Dental Advisory Given   Pulmonary Current Smoker and Patient abstained from smoking.,    breath sounds clear to auscultation       Cardiovascular  Rhythm:Regular Rate:Normal     Neuro/Psych    GI/Hepatic   Endo/Other  diabetes  Renal/GU      Musculoskeletal   Abdominal   Peds  Hematology   Anesthesia Other Findings   Reproductive/Obstetrics                             Anesthesia Physical Anesthesia Plan  ASA: 3  Anesthesia Plan: General   Post-op Pain Management:    Induction: Intravenous  PONV Risk Score and Plan: Ondansetron and Propofol infusion  Airway Management Planned: Oral ETT  Additional Equipment:   Intra-op Plan:   Post-operative Plan: Extubation in OR  Informed Consent: I have reviewed the patients History and Physical, chart, labs and discussed the procedure including the risks, benefits and alternatives for the proposed anesthesia with the patient or authorized representative who has indicated his/her understanding and acceptance.     Dental advisory given  Plan Discussed with: CRNA and Anesthesiologist  Anesthesia Plan Comments:         Anesthesia Quick Evaluation

## 2020-10-16 NOTE — H&P (Signed)
PREOPERATIVE H&P  Chief Complaint: Low back pain  HPI: [name] is a 75 y.o. female who presents with ongoing pain in the low back  MRI reveals subacure compression fractures at L2 and L3  Patient has failed multiple forms of conservative care and continues to have pain (see office notes for additional details regarding the patient's full course of treatment)  Past Medical History:  Diagnosis Date   Allergy    Arthritis    Chicken pox    Colon polyp    COPD (chronic obstructive pulmonary disease) (HCC)    CVA (cerebral infarction)    Diabetes mellitus without complication (HCC)    Diverticulitis    History of blood transfusion    Hyperlipidemia    Insomnia    Nasal polyps    Obesity    PONV (postoperative nausea and vomiting)    Sinusitis, chronic 03/18/2010   Qualifier: Diagnosis of  By: Ninetta Lights MD, Tinnie Gens     Past Surgical History:  Procedure Laterality Date   ABDOMINAL HYSTERECTOMY  2000   APPENDECTOMY     CARDIAC CATHETERIZATION N/A 03/27/2015   Procedure: Left Heart Cath and Coronary Angiography;  Surgeon: Yates Decamp, MD;  Location: [facility] INVASIVE CV LAB;  Service: Cardiovascular;  Laterality: N/A;   CESAREAN SECTION     ELBOW SURGERY Right    KNEE ARTHROPLASTY     NASAL SINUS SURGERY     REPLACEMENT TOTAL KNEE Bilateral 2007 2009   TONSILLECTOMY AND ADENOIDECTOMY     WISDOM TOOTH EXTRACTION     Social History   Socioeconomic History   Marital status: Married    Spouse name: Not on file   Number of children: Not on file   Years of education: Not on file   Highest education level: Not on file  Occupational History   Not on file  Tobacco Use   Smoking status: Every Day    Packs/day: 0.25    Years: 35.00    Pack years: 8.75    Types: Cigarettes   Smokeless tobacco: Never   Tobacco comments:    6 cigarettes per day   Vaping Use   Vaping Use: Never used  Substance and Sexual Activity   Alcohol use: No    Alcohol/week: 0.0 standard drinks    Drug use: No   Sexual activity: Not Currently  Other Topics Concern   Not on file  Social History Narrative   Marital status/children/pets: Married, 1 child.    Education/employment: Automotive engineer educated, retired.   Safety:      -smoke alarm in the home:Yes     - wears seatbelt: Yes     - Feels safe in their relationships: Yes   Social Determinants of Health   Financial Resource Strain: Low Risk    Difficulty of Paying Living Expenses: Not hard at all  Food Insecurity: No Food Insecurity   Worried About Programme researcher, broadcasting/film/video in the Last Year: Never true   Ran Out of Food in the Last Year: Never true  Transportation Needs: No Transportation Needs   Lack of Transportation (Medical): No   Lack of Transportation (Non-Medical): No  Physical Activity: Sufficiently Active   Days of Exercise per Week: 6 days   Minutes of Exercise per Session: 60 min  Stress: Stress Concern Present   Feeling of Stress : To some extent  Social Connections: Moderately Isolated   Frequency of Communication with Friends and Family: More than three times a week  Frequency of Social Gatherings with Friends and Family: Once a week   Attends Religious Services: Never   Database administrator or Organizations: No   Attends Engineer, structural: Never   Marital Status: Married   Family History  Problem Relation Age of Onset   Arthritis Mother    Diabetes Mother        Type I   Aneurysm Father    Stroke Father    Arthritis Sister    Arthritis Sister    Down syndrome Son    Diabetes Son        type I   Allergies  Allergen Reactions   Lipitor [Atorvastatin Calcium]     Myalgia   Prior to Admission medications   Medication Sig Start Date End Date Taking? Authorizing Provider  aspirin EC 81 MG tablet Take 81 mg by mouth daily.   Yes [provider]  cholecalciferol (VITAMIN D3) 25 MCG (1000 UNIT) tablet Take 1,000 Units by mouth daily.   Yes [provider]  folic acid  (FOLVITE) 1 MG tablet TAKE 2 TABLETS BY MOUTH EVERY DAY Patient taking differently: Take 2 mg by mouth daily. 04/15/20  Yes Deveshwar, Janalyn Rouse, MD  losartan (COZAAR) 25 MG tablet Take 1 tablet (25 mg total) by mouth daily. 05/16/20  Yes Kuneff, Renee A, DO  metFORMIN (GLUCOPHAGE) 500 MG tablet Take 1 tablet (500 mg total) by mouth 2 (two) times daily. 05/16/20  Yes Kuneff, Renee A, DO  Methotrexate Sodium (METHOTREXATE, PF,) 50 MG/2ML injection INJECT 0.7 MLS (17.5 MG TOTAL) INTO THE SKIN ONCE A WEEK. *SINGLE USE VIAL* 09/12/20  Yes Gearldine Bienenstock, PA-C  Omega 3 1000 MG CAPS Take 1,000 mg by mouth in the morning and at bedtime.   Yes [provider]  pravastatin (PRAVACHOL) 20 MG tablet Take 1 tablet (20 mg total) by mouth daily. 05/16/20  Yes Kuneff, Renee A, DO  sitaGLIPtin (JANUVIA) 50 MG tablet Take 1 tablet (50 mg total) by mouth daily. 05/16/20  Yes Kuneff, Renee A, DO  zolpidem (AMBIEN) 5 MG tablet Take 1 tablet (5 mg total) by mouth at bedtime as needed for sleep. 05/16/20  Yes Kuneff, Renee A, DO  B-D TB SYRINGE 1CC/27GX1/2" 27G X 1/2" 1 ML MISC 12 SYRINGES BY DOES NOT APPLY ROUTE ONCE A WEEK. 05/02/20   Pollyann Savoy, MD  fenofibrate 160 MG tablet TAKE 1 TABLET BY MOUTH EVERY DAY 10/09/20   Kuneff, Renee A, DO     All other systems have been reviewed and were otherwise negative with the exception of those mentioned in the HPI and as above.  Physical Exam: There were no vitals filed for this visit.  There is no height or weight on file to calculate BMI.  General: Alert, no acute distress Cardiovascular: No pedal edema Respiratory: No cyanosis, no use of accessory musculature Skin: No lesions in the area of chief complaint Neurologic: Sensation intact distally Psychiatric: Patient is competent for consent with normal mood and affect Lymphatic: No axillary or cervical lymphadenopathy   Assessment/Plan: SUBACUTE SUPERIOR OSTEOPOROTIC ENDPLATE COMPRESSION FRACTURES OF LUMBAR 2  AND LUMBAR 3 Plan for Procedure(s): LUMBAR 2 AND LUMBAR 3 KYPHOPLASTY   Jackelyn Hoehn, MD 10/16/2020 8:25 AM

## 2020-10-16 NOTE — Transfer of Care (Signed)
Immediate Anesthesia Transfer of Care Note  Patient: [name]  Procedure(s) Performed: LUMBAR 2 AND LUMBAR 3 KYPHOPLASTY  Patient Location: PACU  Anesthesia Type:General  Level of Consciousness: drowsy  Airway & Oxygen Therapy: Patient Spontanous Breathing  Post-op Assessment: Report given to RN and Post -op Vital signs reviewed and stable  Post vital signs: Reviewed and stable  Last Vitals:  Vitals Value Taken Time  BP 145/86 10/16/20 1817  Temp 36.4 C 10/16/20 1816  Pulse 94 10/16/20 1819  Resp 17 10/16/20 1819  SpO2 97 % 10/16/20 1819  Vitals shown include unvalidated device data.  Last Pain:  Vitals:   10/16/20 1218  TempSrc:   PainSc: 7       Patients Stated Pain Goal: 3 (10/16/20 1218)  Complications: No notable events documented.

## 2020-10-16 NOTE — Op Note (Signed)
PATIENT NAME: [name]   MEDICAL RECORD NO.:   [id number]    DATE OF BIRTH: [birth year]   DATE OF PROCEDURE: 10/16/2020                              OPERATIVE REPORT   PREOPERATIVE DIAGNOSIS:  L2 and L3 osteoporotic compression fractures  POSTOPERATIVE DIAGNOSIS:  L2 and L3 osteoporotic compression fractures  PROCEDURE:  L2 and L3 kyphoplasty procedures  SURGEON:  Estill Bamberg, MD.  ASSISTANTJason Coop, PA-C  ANESTHESIA:  General endotracheal anesthesia.  COMPLICATIONS:  None.  DISPOSITION:  Stable.  ESTIMATED BLOOD LOSS:  Minimal.  INDICATIONS FOR SURGERY:  Briefly, Ms. [name] is a very pleasant 64- year-old patient, who did have an onset of pain in her low back.   Her pain was rather severe.  The patient's imaging studies did clearly reveal osteoporotic compression fractures at L2 and L3.   We did attempt a trial of nonoperative treatment, but the patient continued to feel rather debilitated as a result of her ongoing pain.  Given her ongoing pain and dysfunction, we did discuss proceeding with the procedure noted above.  I did fully discuss the procedure with the patient, and she did wish to proceed.  OPERATIVE DETAILS:  On 10/16/2020, the patient was brought to surgery and general endotracheal anesthesia was administered.  The patient was placed prone on a well-padded flat Jackson bed with gel rolls placed under the patient's chest and hips.  Antibiotics were given.  AP and lateral fluoroscopy was brought into the field.  The L2 and L3 pedicles were marked out in the usual fashion.  After a time-out procedure was performed, I did advance Jamshidis across the L3 pedicles on the right and left sides.  I then advanced a Jamshidi across the right L2 pedicle. At this point, I made multiple attempts at advancing the Jamshidi across the left L2 pedicle, however, there were significant osteophytes, and I was unable to pass the Jamshidi safely across the pedicle and  into the vertebral body after multiple attempts.  I did therefore elect to cannulate the L2 vertebral body using only the right L2 pedicle.  I was however able to advance a drill and curette across the midline from the right L2 pedicle.  I then advanced drills and curettes through the cannulas on the right and left sides at L3. I then inserted kyphoplasty balloons and I was able to inflate the balloons with approximately 5cc of contrast at each level.  Partial restoration of the superior endplates was noted.  At this point, after the cement was mixed, a total of approximately 8cc of cement was injected, half on the right, and half on the left at L3, and through the right pedicle at L2.  Excellent interdigitation of cement was identified. There was no abnormal extravasation of cement identified.  The cement was then allowed to harden, after which point the Jamshidis were removed.  The wound  was then irrigated and closed using 4-0 Monocryl.  Bacitracin and a sterile dressing were applied.  The patient was then awoken from general endotracheal anesthesia and transferred to recovery in stable condition.     Estill Bamberg, MD

## 2020-10-16 NOTE — Anesthesia Procedure Notes (Signed)
Procedure Name: Intubation Date/Time: 10/16/2020 4:31 PM Performed by: Clearnce Sorrel, CRNA Pre-anesthesia Checklist: Patient identified, Emergency Drugs available, Suction available and Patient being monitored Patient Re-evaluated:Patient Re-evaluated prior to induction Oxygen Delivery Method: Circle System Utilized Preoxygenation: Pre-oxygenation with 100% oxygen Induction Type: IV induction Ventilation: Mask ventilation without difficulty Laryngoscope Size: Mac and 3 Grade View: Grade I Tube type: Oral Tube size: 7.0 mm Number of attempts: 1 Airway Equipment and Method: Stylet Placement Confirmation: ETT inserted through vocal cords under direct vision, positive ETCO2 and breath sounds checked- equal and bilateral Secured at: 22 cm Tube secured with: Tape Dental Injury: Teeth and Oropharynx as per pre-operative assessment

## 2020-10-17 ENCOUNTER — Encounter: Payer: Self-pay | Admitting: Family Medicine

## 2020-10-17 NOTE — Anesthesia Postprocedure Evaluation (Signed)
Anesthesia Post Note  Patient: [name]  Procedure(s) Performed: LUMBAR 2 AND LUMBAR 3 KYPHOPLASTY     Patient location during evaluation: PACU Anesthesia Type: General Level of consciousness: awake and alert Pain management: pain level controlled Vital Signs Assessment: post-procedure vital signs reviewed and stable Respiratory status: spontaneous breathing, nonlabored ventilation, respiratory function stable and patient connected to nasal cannula oxygen Cardiovascular status: blood pressure returned to baseline and stable Postop Assessment: no apparent nausea or vomiting Anesthetic complications: no   No notable events documented.  Last Vitals:  Vitals:   10/16/20 1900 10/16/20 1910  BP: (!) 131/92 120/86  Pulse: 82 75  Resp: 16 17  Temp:    SpO2: 97% 96%    Last Pain:  Vitals:   10/16/20 1910  TempSrc:   PainSc: 2                  [name]

## 2020-11-01 DIAGNOSIS — S32039D Unspecified fracture of third lumbar vertebra, subsequent encounter for fracture with routine healing: Secondary | ICD-10-CM | POA: Diagnosis not present

## 2020-11-01 DIAGNOSIS — S32029D Unspecified fracture of second lumbar vertebra, subsequent encounter for fracture with routine healing: Secondary | ICD-10-CM | POA: Diagnosis not present

## 2020-11-01 NOTE — Progress Notes (Addendum)
Office Visit Note  Patient: [name]             Date of Birth: 1945-09-08           MRN: [id number]             PCP: Natalia LeatherwoodKuneff, Renee A, DO Referring: Natalia LeatherwoodKuneff, Renee A, DO Visit Date: 11/14/2020 Occupation: @GUAROCC @  Subjective:  Left middle trigger finger   History of Present Illness: [name] is a 75 y.o. female with history of seronegative rheumatoid arthritis.  She is currently on methotrexate 0.7 ml sq injections once weekly and folic acid 2 mg daily.  She denies any recent rheumatoid arthritis flares.  She has found methotrexate to be effective at managing her symptoms.  She states that she has started to notice her left middle finger locking at times but states her symptoms have been pretty infrequent.  She denies any joint swelling in her hands recently.  She states that about 4 weeks ago she underwent a kyphoplasty x2 performed by Dr. Yevette Edwardsumonski.  She has gradually been noticing improvement in her lower back discomfort.   Activities of Daily Living:  Patient reports morning stiffness for 1 hour.   Patient Denies nocturnal pain.  Difficulty dressing/grooming: Denies Difficulty climbing stairs: Reports Difficulty getting out of chair: Reports Difficulty using hands for taps, buttons, cutlery, and/or writing: Denies  Review of Systems  Constitutional:  Positive for fatigue.  HENT:  Negative for mouth sores, mouth dryness and nose dryness.   Eyes:  Negative for pain, itching and dryness.  Respiratory:  Negative for shortness of breath and difficulty breathing.   Cardiovascular:  Negative for chest pain and palpitations.  Gastrointestinal:  Negative for blood in stool, constipation and diarrhea.  Endocrine: Negative for increased urination.  Genitourinary:  Negative for difficulty urinating.  Musculoskeletal:  Positive for morning stiffness. Negative for joint pain, joint pain, joint swelling, myalgias, muscle tenderness and myalgias.  Skin:  Positive for rash.  Negative for color change.  Allergic/Immunologic: Negative for susceptible to infections.  Neurological:  Positive for dizziness. Negative for numbness, headaches and memory loss.  Hematological:  Negative for bruising/bleeding tendency.  Psychiatric/Behavioral:  Negative for confusion.    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   Hypermetropia of both eyes 04/22/2018   Hyperopia with presbyopia 04/22/2018   Osteopenia 02/14/2016   Cataract associated with type 2 diabetes mellitus (HCC) 02/14/2016   Hypertension associated with diabetes (HCC) 02/16/2012   Occasional cigarette smoker 04/07/2010   Hyperlipidemia associated with type 2 diabetes mellitus (HCC) 03/18/2010    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   Diverticulitis    Family history of colonic polyps 05/16/2020   History of blood transfusion    Hyperlipidemia    Insomnia    Internal hemorrhoids 05/16/2020   Nasal polyps    Obesity    PONV (postoperative nausea and vomiting)    Radial styloid  tenosynovitis of left hand 12/21/2019   Rectal bleeding 05/16/2020   Sinusitis, chronic 03/18/2010   Qualifier: Diagnosis of  By: Ninetta Lights MD, Jewel Baize History  Problem Relation Age of Onset   Arthritis Mother    Diabetes Mother        Type I   Aneurysm Father    Stroke Father    Arthritis Sister    Arthritis Sister    Down syndrome Son    Diabetes Son        type I   Past  Surgical History:  Procedure Laterality Date   ABDOMINAL HYSTERECTOMY  2000   APPENDECTOMY     CARDIAC CATHETERIZATION N/A 03/27/2015   Procedure: Left Heart Cath and Coronary Angiography;  Surgeon: Yates Decamp, MD;  Location: [facility];  Service: Cardiovascular;  Laterality: N/A;   CESAREAN SECTION     ELBOW SURGERY Right    FIXATION KYPHOPLASTY LUMBAR SPINE  10/16/2020   L2 and L3 kyphoplasty   KNEE ARTHROPLASTY     KYPHOPLASTY N/A 10/16/2020   Procedure: LUMBAR 2 AND LUMBAR 3 KYPHOPLASTY;  Surgeon: Estill Bamberg, MD;  Location: [facility] OR;  Service: Orthopedics;  Laterality: N/A;   NASAL SINUS SURGERY     REPLACEMENT TOTAL KNEE Bilateral 2007 2009   TONSILLECTOMY AND ADENOIDECTOMY     WISDOM TOOTH EXTRACTION     Social History   Social History Narrative   Marital status/children/pets: Married, 1 child.    Education/employment: Automotive engineer educated, retired.   Safety:      -smoke alarm in the home:Yes     - wears seatbelt: Yes     - Feels safe in their relationships: Yes   Immunization History  Administered Date(s) Administered   Fluad Quad(high Dose 65+) 01/11/2019, 02/15/2020   Influenza Whole 04/21/2010   Influenza, High Dose Seasonal PF 02/20/2013, 02/07/2018   Influenza,inj,Quad PF,6+ Mos 02/23/2017   Influenza-Unspecified 02/15/2014, 01/30/2016, 02/07/2018, 01/11/2019   PFIZER Comirnaty(Gray Top)Covid-19 Tri-Sucrose Vaccine 10/09/2020   PFIZER(Purple Top)SARS-COV-2 Vaccination 06/08/2019, 07/03/2019, 01/04/2020   Pneumococcal Conjugate-13 02/20/2013   Pneumococcal Polysaccharide-23 04/05/2006   Pneumococcal-Unspecified 03/05/2015   Tdap 09/14/2001, 03/05/2015   Tetanus 03/27/2014   Zoster Recombinat (Shingrix) 10/12/2017, 02/07/2018   Zoster, Live 12/07/2013     Objective: Vital Signs: BP 125/78 (BP Location: Left Arm, Patient Position: Sitting, Cuff Size: Normal)   Pulse 85   Resp 17   Ht  (1.626 m)   Wt 205 lb 12.8 oz (93.4 kg)   BMI 35.33 kg/m     Physical Exam Vitals and nursing note reviewed.  Constitutional:      Appearance: She is well-developed.  HENT:     Head: Normocephalic and atraumatic.  Eyes:     Conjunctiva/sclera: Conjunctivae normal.  Pulmonary:     Effort: Pulmonary effort is normal.  Abdominal:     Palpations: Abdomen is soft.  Musculoskeletal:     Cervical back: Normal range of motion.  Skin:    General: Skin is warm and dry.     Capillary Refill: Capillary refill takes less than 2 seconds.  Neurological:     Mental Status: She is alert and oriented to person, place, and time.  Psychiatric:        Behavior: Behavior normal.     Musculoskeletal Exam: C-spine has good range of motion with no discomfort.  Some discomfort with lumbar range of motion.  Shoulder joints, elbow joints, wrist joints, MCPs, PIPs, DIPs have good range of  motion with no synovitis.  She has PIP and DIP thickening consistent with osteoarthritis of both hands.  Left middle trigger finger.  Complete fist formation bilaterally.  Hip joints have good range of motion with no discomfort.  Bilateral knee replacements have good range of motion with no discomfort.  No warmth or effusion of knee replacements at this time.  Synovial thickening over the left ankle.  No tenderness over MTP joints.  CDAI Exam: CDAI Score: 0.4  Patient Global: 2 mm; Provider Global: 2 mm Swollen: 0 ; Tender: 0  Joint Exam 11/14/2020   No joint exam has been documented for this visit   There is currently no information documented on the homunculus. Go to the Rheumatology activity and complete the homunculus joint exam.  Investigation: No additional findings.  Imaging: DG Lumbar Spine 2-3 Views  Result Date: 10/16/2020 CLINICAL DATA:  Surgery, elective. Additional history provided: L2 and L3 kyphoplasty. Provided fluoroscopy time: 5 minutes, 28 seconds. EXAM: LUMBAR SPINE - 2-3 VIEW; DG C-ARM 1-60 MIN COMPARISON:  Lumbar spine MRI 09/21/2020. FINDINGS: AP and  lateral view intraprocedural fluoroscopic images of the lumbar spine are submitted, 2 images total. On the provided images, kyphoplasty material is present within the L2 and L3 vertebrae (at site of known compression fractures). There may be minimal extension of kyphoplasty material posterior to the L2 vertebral body. IMPRESSION: Two intraprocedural fluoroscopic images of the lumbar spine from reported L2 and L3 kyphoplasty, as described. Electronically Signed   By: Jackey Loge DO   On: 10/16/2020 18:20   DG C-Arm 1-60 Min  Result Date: 10/16/2020 CLINICAL DATA:  Surgery, elective. Additional history provided: L2 and L3 kyphoplasty. Provided fluoroscopy time: 5 minutes, 28 seconds. EXAM: LUMBAR SPINE - 2-3 VIEW; DG C-ARM 1-60 MIN COMPARISON:  Lumbar spine MRI 09/21/2020. FINDINGS: AP and lateral view intraprocedural fluoroscopic images of the lumbar spine are submitted, 2 images total. On the provided images, kyphoplasty material is present within the L2 and L3 vertebrae (at site of known compression fractures). There may be minimal extension of kyphoplasty material posterior to the L2 vertebral body. IMPRESSION: Two intraprocedural fluoroscopic images of the lumbar spine from reported L2 and L3 kyphoplasty, as described. Electronically Signed   By: Jackey Loge DO   On: 10/16/2020 18:20    Recent Labs: Lab Results  Component Value Date   WBC 7.4 10/14/2020   HGB 14.1 10/14/2020   PLT 267 10/14/2020   NA 137 10/14/2020   K 4.2 10/14/2020   CL 105 10/14/2020   CO2 23 10/14/2020   GLUCOSE 118 (H) 10/14/2020   BUN 22 10/14/2020   CREATININE 0.87 10/14/2020   BILITOT 0.2 (L) 10/14/2020   ALKPHOS 33 (L) 10/14/2020   AST 25 10/14/2020   ALT 19 10/14/2020   PROT 7.8 10/14/2020   ALBUMIN 4.0 10/14/2020   CALCIUM 9.3 10/14/2020   GFRAA 73 09/05/2020   QFTBGOLDPLUS NEGATIVE 06/28/2019    Speciality Comments: No specialty comments available.  Procedures:  No procedures performed Allergies:  Lipitor [atorvastatin calcium]   Assessment / Plan:     Visit Diagnoses: Rheumatoid arthritis of multiple sites with negative rheumatoid factor (HCC): She has no joint tenderness or synovitis on examination today.  She has not had any recent rheumatoid arthritis flares.  She is clinically doing well on methotrexate 0.7 mL subcutaneous injections once weekly and folic acid 2 mg by mouth daily.  She has not missed any doses of methotrexate recently.  She has been getting preservative-free  methotrexate which comes in a single use vial, but she has been using it for multiple doses in order to avoid wasting the medication.  We called the pharmacy today to switch her to the methotrexate with preservatives to avoid any confusion with single use vials in the future.  We also called to ensure that the patient will be receiving the correct syringes to inject methotrexate since she recently had to use a 1 inch needle for her injection.  New prescriptions for both medications were sent to the pharmacy today to be dispensed as written.  She will remain on methotrexate as monotherapy.  She was advised to notify us if she develops increased joint pain or joint swelling.  She will follow-up in the office in 5 months.  High risk medication use - Methotrexate 0.7 mL sq injections once weekly and folic acid 2 mg by mouth daily.  CBC and CMP were updated on 10/14/2020 and results were reviewed with the patient today in the office.  She will return for lab work in September and every 3 months to monitor for drug toxicity.  Standing orders for CBC and CMP remain in place. She has not had any recent infections.  We discussed the importance of holding methotrexate if she develops signs or symptoms of an infection and to resume once the infection has completely cleared.  Primary osteoarthritis of both hands: She has PIP and DIP thickening consistent with osteoarthritis of both hands.  No tenderness or inflammation was noted.  She was  able to make a complete fist bilaterally.  Joint protection and muscle strengthening were discussed.  Trigger finger, left middle finger: She has been experiencing intermittent triggering of the left middle finger.  Her symptoms have been infrequent and mild.  We discussed buddy taping her middle and ring finger or using a splint.  She can also try using Voltaren gel topically as needed.  She was advised to notify us if her symptoms persist or worsen and we will schedule an ultrasound guided cortisone injection in the future.  Status post total bilateral knee replacement - Dr.Dalldorf-She had some increased discomfort in her left knee replacement about 2 months ago and was prescribed a prednisone taper by Dr. Clement Husbands.  She is not having any discomfort in her knee replacements at this time.  She has good range of motion on examination today.  Primary osteoarthritis of both feet: She is not experiencing any discomfort in her feet at this time.  Other fatigue: Chronic, stable.   Osteopenia of multiple sites - DEXA on 12/14/17: The BMD measured at Femur Neck Right is 0.971 g/cm2 with a T-score of -0.5. Repeat DEXA in 5 years.   She underwent a kyphoplasty for L2 and L3 on 10/16/2020 performed by Dr. Yevette Edwards.  According to the patient she fell while on her lawnmower and fractured 2 vertebrae.  I think it would be wise to update her bone density sooner this year.   Vitamin D deficiency: She is taking vitamin D 1000 units daily.  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  Meds ordered this  encounter  Medications   methotrexate 50 MG/2ML injection    Sig: INJECT 0.7 MLS (17.5 MG TOTAL) INTO THE SKIN ONCE A WEEK.    Dispense:  10 mL    Refill:  0    Please dispense WITH PRESERVATIVES. Thanks!   Tuberculin-Allergy Syringes 27G X 1/2" 1 ML MISC    Sig: Use 1 syringe once weekly to inject methotrexate.    Dispense:  12 each    Refill:  3     Follow-Up Instructions: Return in about 5 months (around 04/16/2021) for Rheumatoid arthritis.   Gearldine Bienenstock, PA-C  Note - This record has been created using Dragon software.  Chart creation errors have been sought, but may not always  have been located. Such creation errors do not reflect on  the standard of medical care.

## 2020-11-02 ENCOUNTER — Telehealth: Payer: Self-pay | Admitting: Family Medicine

## 2020-11-06 DIAGNOSIS — S32039D Unspecified fracture of third lumbar vertebra, subsequent encounter for fracture with routine healing: Secondary | ICD-10-CM | POA: Diagnosis not present

## 2020-11-06 DIAGNOSIS — S32029D Unspecified fracture of second lumbar vertebra, subsequent encounter for fracture with routine healing: Secondary | ICD-10-CM | POA: Diagnosis not present

## 2020-11-08 ENCOUNTER — Ambulatory Visit: Payer: PPO | Admitting: Family Medicine

## 2020-11-08 MED ORDER — FENOFIBRATE 160 MG PO TABS: 160.0000 mg | ORAL_TABLET | Freq: Every day | ORAL | 0 refills | Status: DC

## 2020-11-08 NOTE — Telephone Encounter (Signed)
Patient is out of meds and no refills.  She came in today at 2:00PM, when her appt was at 1:30PM.  Patient states she was told her appt was at 2:20PM.  I explained to her that our patients are scheduled in half over increments only, so 2:20PM would not be a possible time to see Dr. Claiborne Billings. She rescheduled appt to 7/13 at 3:30pm.  I gave her a reminder card with time and date.  fenofibrate 160 MG tablet [[id number]]    [facility]

## 2020-11-08 NOTE — Addendum Note (Signed)
Addended by: Maxie Barb on: 11/08/2020 02:40 PM   Modules accepted: Orders

## 2020-11-08 NOTE — Telephone Encounter (Signed)
Rx sent 

## 2020-11-13 ENCOUNTER — Encounter: Payer: Self-pay | Admitting: Family Medicine

## 2020-11-13 ENCOUNTER — Other Ambulatory Visit: Payer: Self-pay

## 2020-11-13 ENCOUNTER — Ambulatory Visit (INDEPENDENT_AMBULATORY_CARE_PROVIDER_SITE_OTHER): Payer: PPO | Admitting: Family Medicine

## 2020-11-13 VITALS — BP 80/56 | HR 89 | Temp 98.0°F | Ht 64.0 in | Wt 203.0 lb

## 2020-11-13 DIAGNOSIS — E785 Hyperlipidemia, unspecified: Secondary | ICD-10-CM | POA: Diagnosis not present

## 2020-11-13 DIAGNOSIS — I152 Hypertension secondary to endocrine disorders: Secondary | ICD-10-CM | POA: Diagnosis not present

## 2020-11-13 DIAGNOSIS — G479 Sleep disorder, unspecified: Secondary | ICD-10-CM

## 2020-11-13 DIAGNOSIS — E1159 Type 2 diabetes mellitus with other circulatory complications: Secondary | ICD-10-CM | POA: Diagnosis not present

## 2020-11-13 DIAGNOSIS — E1169 Type 2 diabetes mellitus with other specified complication: Secondary | ICD-10-CM | POA: Diagnosis not present

## 2020-11-13 DIAGNOSIS — E559 Vitamin D deficiency, unspecified: Secondary | ICD-10-CM

## 2020-11-13 DIAGNOSIS — E118 Type 2 diabetes mellitus with unspecified complications: Secondary | ICD-10-CM

## 2020-11-13 DIAGNOSIS — M0609 Rheumatoid arthritis without rheumatoid factor, multiple sites: Secondary | ICD-10-CM

## 2020-11-13 LAB — POCT GLYCOSYLATED HEMOGLOBIN (HGB A1C)
HbA1c POC (<> result, manual entry): 6.4 % (ref 4.0–5.6)
HbA1c, POC (controlled diabetic range): 6.4 % (ref 0.0–7.0)
HbA1c, POC (prediabetic range): 6.4 % (ref 5.7–6.4)
Hemoglobin A1C: 6.4 % — AB (ref 4.0–5.6)

## 2020-11-13 MED ORDER — METFORMIN HCL 500 MG PO TABS: 500.0000 mg | ORAL_TABLET | Freq: Two times a day (BID) | ORAL | 1 refills | Status: DC

## 2020-11-13 MED ORDER — FENOFIBRATE 160 MG PO TABS: 160.0000 mg | ORAL_TABLET | Freq: Every day | ORAL | 3 refills | Status: DC

## 2020-11-13 MED ORDER — SITAGLIPTIN PHOSPHATE 50 MG PO TABS: 50.0000 mg | ORAL_TABLET | Freq: Every day | ORAL | 1 refills | Status: DC

## 2020-11-13 MED ORDER — LOSARTAN POTASSIUM 25 MG PO TABS: 12.5000 mg | ORAL_TABLET | Freq: Every day | ORAL | 1 refills | Status: DC

## 2020-11-13 MED ORDER — PRAVASTATIN SODIUM 20 MG PO TABS: 20.0000 mg | ORAL_TABLET | Freq: Every day | ORAL | 3 refills | Status: DC

## 2020-11-13 NOTE — Progress Notes (Signed)
This visit occurred during the SARS-CoV-2 public health emergency.  Safety protocols were in place, including screening questions prior to the visit, additional usage of staff PPE, and extensive cleaning of exam room while observing appropriate contact time as indicated for disinfecting solutions.    Patient ID: [name], female  DOB: 11/04/1945, 75 y.o.   MRN: [id number] Patient Care Team    Relationship Specialty Notifications Start End  Natalia Leatherwood, DO PCP - General Family Medicine  10/10/19   Pollyann Savoy, MD Consulting Physician Rheumatology  10/10/19   Charna Elizabeth, MD Consulting Physician Gastroenterology  10/10/19   Renae Fickle, OD  Optometry  10/10/19     Chief Complaint  Patient presents with   Diabetes    [facility]; pt is not fasting    [name] is a 75 y.o.  Female  present for [facility] Hypertension associated with diabetes (HCC)/obesity/statin intolerance/hyperlipidemia Pt reports compliance with losartan 25 mg daily, pravastatin and fenofibrate. Blood pressures ranges at home not routinely checked Patient denies chest pain, shortness of breath or lower extremity edema.  She does admit to occasional dizziness. Pt takes a daily baby ASA.       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  Patient is prescribed methotrexate and folate.   Type 2 diabetes mellitus with unspecified complications (HCC)/cataracts associated with type 2 diabetes Pt reports compliance with metformin 500 mg twice daily and Januvia.  She reports she was diagnosed with diabetes when she was around 52.  Patient denies dizziness, hyperglycemic or hypoglycemic events. Patient denies numbness, tingling in the extremities or nonhealing wounds of feet.  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  She is managed closely by her ophthalmology team Dr. Antionette Char has APPT in April 2022- retinopathy present.     vitamin D deficiency Vitamin D levels are in normal range.  She is tolerating supplement 2000 un it daily.    Sleep disturbance Patient reports she is prescribed Ambien 5 mg nightly as needed.  She very rarely takes this medication.  When she does she takes the medication she takes one third of a tab.  States she has had the same bottle for over a year with 30 tabs in it.  She would like refills on this today.  Depression screen [facility] 2/9 02/21/2020 02/15/2020 10/26/2019 10/18/2018 02/14/2018  Decreased Interest 0 0 0 - 0  Down, Depressed, Hopeless 0 0 0 0 0  PHQ - 2 Score 0 0 0 0 0   No flowsheet data found.   Immunization History  Administered Date(s) Administered   Fluad Quad(high Dose 65+) 01/11/2019, 02/15/2020   Influenza Whole 04/21/2010   Influenza, High Dose Seasonal PF 02/20/2013, 02/07/2018   Influenza,inj,Quad PF,6+ Mos 02/23/2017   Influenza-Unspecified 02/15/2014, 01/30/2016, 02/07/2018, 01/11/2019   PFIZER Comirnaty(Gray Top)Covid-19 Tri-Sucrose Vaccine 10/09/2020   PFIZER(Purple Top)SARS-COV-2 Vaccination 06/08/2019, 07/03/2019, 01/04/2020   Pneumococcal Conjugate-13 02/20/2013   Pneumococcal Polysaccharide-23 04/05/2006   Pneumococcal-Unspecified 03/05/2015   Tdap 09/14/2001, 03/05/2015   Tetanus 03/27/2014   Zoster Recombinat (Shingrix) 10/12/2017, 02/07/2018   Zoster, Live 12/07/2013     Past Medical History:  Diagnosis Date   Allergy    Arthritis    Chicken pox    Colon polyp    COPD (chronic obstructive pulmonary disease) (HCC)    CVA (cerebral infarction)  Diabetes mellitus without complication (HCC)    Diverticulitis    History of blood transfusion    Hyperlipidemia    Insomnia    Nasal polyps    Obesity    PONV (postoperative nausea and vomiting)    Sinusitis, chronic 03/18/2010   Qualifier: Diagnosis of   By: Ninetta Lights MD, Tinnie Gens     Allergies  Allergen Reactions   Lipitor [Atorvastatin Calcium]     Myalgia   Past Surgical History:  Procedure Laterality Date   ABDOMINAL HYSTERECTOMY  2000   APPENDECTOMY     CARDIAC CATHETERIZATION N/A 03/27/2015   Procedure: Left Heart Cath and Coronary Angiography;  Surgeon: Yates Decamp, MD;  Location: [facility] INVASIVE CV LAB;  Service: Cardiovascular;  Laterality: N/A;   CESAREAN SECTION     ELBOW SURGERY Right    FIXATION KYPHOPLASTY LUMBAR SPINE  10/16/2020   L2 and L3 kyphoplasty   KNEE ARTHROPLASTY     KYPHOPLASTY N/A 10/16/2020   Procedure: LUMBAR 2 AND LUMBAR 3 KYPHOPLASTY;  Surgeon: Estill Bamberg, MD;  Location: [facility] OR;  Service: Orthopedics;  Laterality: N/A;   NASAL SINUS SURGERY     REPLACEMENT TOTAL KNEE Bilateral 2007 2009   TONSILLECTOMY AND ADENOIDECTOMY     WISDOM TOOTH EXTRACTION     Family History  Problem Relation Age of Onset   Arthritis Mother    Diabetes Mother        Type I   Aneurysm Father    Stroke Father    Arthritis Sister    Arthritis Sister    Down syndrome Son    Diabetes Son        type I   Social History   Social History Narrative   Marital status/children/pets: Married, 1 child.    Education/employment: Automotive engineer educated, retired.   Safety:      -smoke alarm in the home:Yes     - wears seatbelt: Yes     - Feels safe in their relationships: Yes    Allergies as of 11/13/2020       Reactions   Lipitor [atorvastatin Calcium]    Myalgia        Medication List        Accurate as of November 13, 2020 11:59 PM. If you have any questions, ask your nurse or doctor.          STOP taking these medications    HYDROcodone-acetaminophen 5-325 MG tablet Commonly known as: NORCO/VICODIN Stopped by: Felix Pacini, DO   methocarbamol 500 MG tablet Commonly known as: Robaxin Stopped by: Felix Pacini, DO       TAKE these medications    B-D TB SYRINGE 1CC/27GX1/2" 27G X 1/2" 1 ML Misc Generic drug:  TUBERCULIN SYR 1CC/27GX1/2" 12 SYRINGES BY DOES NOT APPLY ROUTE ONCE A WEEK.   cholecalciferol 25 MCG (1000 UNIT) tablet Commonly known as: VITAMIN D3 Take 1,000 Units by mouth daily.   fenofibrate 160 MG tablet Take 1 tablet (160 mg total) by mouth daily.   folic acid 1 MG tablet Commonly known as: FOLVITE TAKE 2 TABLETS BY MOUTH EVERY DAY   losartan 25 MG tablet Commonly known as: COZAAR Take 0.5 tablets (12.5 mg total) by mouth daily. What changed: how much to take Changed by: Felix Pacini, DO   metFORMIN 500 MG tablet Commonly known as: GLUCOPHAGE Take 1 tablet (500 mg total) by mouth 2 (two) times daily.   methotrexate (PF) 50 MG/2ML injection INJECT 0.7 MLS (17.5 MG TOTAL) INTO THE SKIN ONCE  A WEEK. *SINGLE USE VIAL*   pravastatin 20 MG tablet Commonly known as: PRAVACHOL Take 1 tablet (20 mg total) by mouth daily.   sitaGLIPtin 50 MG tablet Commonly known as: JANUVIA Take 1 tablet (50 mg total) by mouth daily.   zolpidem 5 MG tablet Commonly known as: AMBIEN Take 1 tablet (5 mg total) by mouth at bedtime as needed for sleep.        All past medical history, surgical history, allergies, family history, immunizations andmedications were updated in the EMR today and reviewed under the history and medication portions of their EMR.      ROS: 14 pt review of systems performed and negative (unless mentioned in an HPI)  Objective: BP (!) 80/56   Pulse 89   Temp 98 F (36.7 C) (Oral)   Ht 5\' 4"  (1.626 m)   Wt 203 lb (92.1 kg)   SpO2 97%   BMI 34.84 kg/m  Gen: Afebrile. No acute distress.  Nontoxic, very pleasant, obese female. HENT: AT. [address].  No cough.  No hoarseness. Eyes:Pupils Equal Round Reactive to light, Extraocular movements intact,  Conjunctiva without redness, discharge or icterus. Neck/lymp/endocrine: Supple, no lymphadenopathy, no thyromegaly CV: RRR no murmur, no edema, +2/4 P posterior tibialis pulses Chest: CTAB, no wheeze or crackles.   Neuro:Normal gait. PERLA. EOMi. Alert. Oriented x3 Psych: Normal affect, dress and demeanor. Normal speech. Normal thought content and judgment..    Results for orders placed or performed in visit on 11/13/20 (from the past 24 hour(s))  POCT HgB A1C     Status: Abnormal   Collection Time: 11/13/20  3:40 PM  Result Value Ref Range   Hemoglobin A1C 6.4 (A) 4.0 - 5.6 %   HbA1c POC (<> result, manual entry) 6.4 4.0 - 5.6 %   HbA1c, POC (prediabetic range) 6.4 5.7 - 6.4 %   HbA1c, POC (controlled diabetic range) 6.4 0.0 - 7.0 %    No results found.  Assessment/plan: SKY PRIMO is a 75 y.o. female present for  Hypertension/Hyperlipidemia  -Rather significant low blood pressure today with reports of dizziness.  I have recommended she stop the losartan and monitor her home pressures.  If home pressures are above 130 systolic, then she is to start losartan half a tab daily. -Continue pravastatin -Continue omega-3 and fenofibrate. -Labs up-to-date  Osteopenia of multiple sites/vitamin D deficiency Stable she is supplementing with vitamin D 2000 units daily  diabetes (HCC)/obesity/cataracts associated with diabetes Stable Continue metformin 500 mg twice daily for now> had SE to higher dose.  Continue Januvia 50 mg daily Continue routine exercise and low-sodium diet. 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  She is managed closely by her ophthalmology team Dr. 07/2019 has APPT in April 2022- retinopathy present.  A1c:7.4>>7.6>6.5>6.4 today Follow-up in 5 months with other chronic conditions.   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 stable  Sleep disturbance: Stable Continue Ambien half tab as  needed-rarely uses medication. May 2022 [place] controlled substance database reviewed     Return in about 6 months (around 05/05/2021) for [facility] (30 min).  Orders Placed This Encounter  Procedures   POCT HgB A1C   [facility] DIABETES EYE EXAM   Meds ordered this encounter  Medications   fenofibrate 160 MG tablet    Sig: Take  1 tablet (160 mg total) by mouth daily.    Dispense:  90 tablet    Refill:  3   losartan (COZAAR) 25 MG tablet    Sig: Take 0.5 tablets (12.5 mg total) by mouth daily.    Dispense:  45 tablet    Refill:  1   metFORMIN (GLUCOPHAGE) 500 MG tablet    Sig: Take 1 tablet (500 mg total) by mouth 2 (two) times daily.    Dispense:  180 tablet    Refill:  1   sitaGLIPtin (JANUVIA) 50 MG tablet    Sig: Take 1 tablet (50 mg total) by mouth daily.    Dispense:  90 tablet    Refill:  1   pravastatin (PRAVACHOL) 20 MG tablet    Sig: Take 1 tablet (20 mg total) by mouth daily.    Dispense:  90 tablet    Refill:  3   zolpidem (AMBIEN) 5 MG tablet    Sig: Take 1 tablet (5 mg total) by mouth at bedtime as needed for sleep.    Dispense:  30 tablet    Refill:  1    Referral Orders  No referral(s) requested today     Electronically signed by: Felix Pacinienee Meryle Pugmire, DO [facility]

## 2020-11-13 NOTE — Patient Instructions (Signed)
Great to see you today.  I have refilled the medication(s) we provide.   If labs were collected, we will inform you of lab results once received either by echart message or telephone call.   - echart message- for normal results that have been seen by the patient already.   - telephone call: abnormal results or if patient has not viewed results in their echart.  

## 2020-11-14 ENCOUNTER — Ambulatory Visit: Payer: PPO | Admitting: Physician Assistant

## 2020-11-14 ENCOUNTER — Encounter: Payer: Self-pay | Admitting: Family Medicine

## 2020-11-14 ENCOUNTER — Encounter: Payer: Self-pay | Admitting: Physician Assistant

## 2020-11-14 VITALS — BP 125/78 | HR 85 | Resp 17 | Ht 64.0 in | Wt 205.8 lb

## 2020-11-14 DIAGNOSIS — Z96653 Presence of artificial knee joint, bilateral: Secondary | ICD-10-CM

## 2020-11-14 DIAGNOSIS — M19071 Primary osteoarthritis, right ankle and foot: Secondary | ICD-10-CM | POA: Diagnosis not present

## 2020-11-14 DIAGNOSIS — M791 Myalgia, unspecified site: Secondary | ICD-10-CM | POA: Diagnosis not present

## 2020-11-14 DIAGNOSIS — Z789 Other specified health status: Secondary | ICD-10-CM | POA: Diagnosis not present

## 2020-11-14 DIAGNOSIS — E1136 Type 2 diabetes mellitus with diabetic cataract: Secondary | ICD-10-CM

## 2020-11-14 DIAGNOSIS — E1159 Type 2 diabetes mellitus with other circulatory complications: Secondary | ICD-10-CM | POA: Diagnosis not present

## 2020-11-14 DIAGNOSIS — M8589 Other specified disorders of bone density and structure, multiple sites: Secondary | ICD-10-CM

## 2020-11-14 DIAGNOSIS — E559 Vitamin D deficiency, unspecified: Secondary | ICD-10-CM

## 2020-11-14 DIAGNOSIS — M0609 Rheumatoid arthritis without rheumatoid factor, multiple sites: Secondary | ICD-10-CM | POA: Diagnosis not present

## 2020-11-14 DIAGNOSIS — I152 Hypertension secondary to endocrine disorders: Secondary | ICD-10-CM

## 2020-11-14 DIAGNOSIS — E785 Hyperlipidemia, unspecified: Secondary | ICD-10-CM

## 2020-11-14 DIAGNOSIS — R5383 Other fatigue: Secondary | ICD-10-CM

## 2020-11-14 DIAGNOSIS — Z79899 Other long term (current) drug therapy: Secondary | ICD-10-CM | POA: Diagnosis not present

## 2020-11-14 DIAGNOSIS — M19041 Primary osteoarthritis, right hand: Secondary | ICD-10-CM

## 2020-11-14 DIAGNOSIS — M65332 Trigger finger, left middle finger: Secondary | ICD-10-CM

## 2020-11-14 DIAGNOSIS — M19072 Primary osteoarthritis, left ankle and foot: Secondary | ICD-10-CM

## 2020-11-14 DIAGNOSIS — M19042 Primary osteoarthritis, left hand: Secondary | ICD-10-CM

## 2020-11-14 DIAGNOSIS — Z8709 Personal history of other diseases of the respiratory system: Secondary | ICD-10-CM

## 2020-11-14 DIAGNOSIS — T466X5A Adverse effect of antihyperlipidemic and antiarteriosclerotic drugs, initial encounter: Secondary | ICD-10-CM

## 2020-11-14 DIAGNOSIS — E1169 Type 2 diabetes mellitus with other specified complication: Secondary | ICD-10-CM

## 2020-11-14 DIAGNOSIS — E118 Type 2 diabetes mellitus with unspecified complications: Secondary | ICD-10-CM | POA: Diagnosis not present

## 2020-11-14 DIAGNOSIS — Z720 Tobacco use: Secondary | ICD-10-CM

## 2020-11-14 DIAGNOSIS — H47013 Ischemic optic neuropathy, bilateral: Secondary | ICD-10-CM

## 2020-11-14 MED ORDER — ZOLPIDEM TARTRATE 5 MG PO TABS: 5.0000 mg | ORAL_TABLET | Freq: Every evening | ORAL | 1 refills | Status: DC | PRN

## 2020-11-14 MED ORDER — METHOTREXATE SODIUM CHEMO INJECTION 50 MG/2ML: INTRAMUSCULAR | 0 refills | Status: DC

## 2020-11-14 MED ORDER — "TUBERCULIN-ALLERGY SYRINGES 27G X 1/2"" 1 ML MISC": 3 refills | Status: DC

## 2020-11-14 NOTE — Patient Instructions (Signed)
Standing Labs We placed an order today for your standing lab work.   Please have your standing labs drawn in September and every 3 months  If possible, please have your labs drawn 2 weeks prior to your appointment so that the provider can discuss your results at your appointment.  Please note that you may see your imaging and lab results in MyChart before we have reviewed them. We may be awaiting multiple results to interpret others before contacting you. Please allow our office up to 72 hours to thoroughly review all of the results before contacting the office for clarification of your results.  We have open lab daily: Monday through Thursday from 1:30-4:30 PM and Friday from 1:30-4:00 PM at the office of Dr. Shaili Deveshwar, [facility].   Please be advised, all patients with office appointments requiring lab work will take precedent over walk-in lab work.  If possible, please come for your lab work on Monday and Friday afternoons, as you may experience shorter wait times. The office is located at [address], [address], [place], [place] [address] No appointment is necessary.   Labs are drawn by [facility]. Please bring your co-pay at the time of your lab draw.  You may receive a bill from [facility] for your lab work.  If you wish to have your labs drawn at another location, please call the office 24 hours in advance to send orders.  If you have any questions regarding directions or hours of operation,  please call [phone].   As a reminder, please drink plenty of water prior to coming for your lab work. Thanks! 

## 2020-11-18 ENCOUNTER — Other Ambulatory Visit: Payer: Self-pay | Admitting: Family Medicine

## 2020-11-18 DIAGNOSIS — Z1231 Encounter for screening mammogram for malignant neoplasm of breast: Secondary | ICD-10-CM

## 2020-11-22 ENCOUNTER — Ambulatory Visit: Payer: PPO

## 2020-11-22 ENCOUNTER — Other Ambulatory Visit: Payer: Self-pay

## 2020-11-22 ENCOUNTER — Ambulatory Visit
Admission: RE | Admit: 2020-11-22 | Discharge: 2020-11-22 | Disposition: A | Discharge status: Home / Self Care | Payer: PPO | Source: Ambulatory Visit | Attending: Family Medicine | Admitting: Family Medicine

## 2020-11-22 DIAGNOSIS — Z1231 Encounter for screening mammogram for malignant neoplasm of breast: Secondary | ICD-10-CM | POA: Diagnosis not present

## 2020-12-30 ENCOUNTER — Encounter: Payer: Self-pay | Admitting: Family Medicine

## 2021-01-09 ENCOUNTER — Other Ambulatory Visit: Payer: Self-pay | Admitting: *Deleted

## 2021-01-09 DIAGNOSIS — Z79899 Other long term (current) drug therapy: Secondary | ICD-10-CM | POA: Diagnosis not present

## 2021-01-09 LAB — CBC WITH DIFFERENTIAL/PLATELET
Absolute Monocytes: 401 cells/uL (ref 200–950)
Basophils Absolute: 30 cells/uL (ref 0–200)
Basophils Relative: 0.5 %
Eosinophils Absolute: 212 cells/uL (ref 15–500)
Eosinophils Relative: 3.6 %
HCT: 42 % (ref 35.0–45.0)
Hemoglobin: 13.5 g/dL (ref 11.7–15.5)
Lymphs Abs: 1847 cells/uL (ref 850–3900)
MCH: 29.5 pg (ref 27.0–33.0)
MCHC: 32.1 g/dL (ref 32.0–36.0)
MCV: 91.9 fL (ref 80.0–100.0)
MPV: 9.7 fL (ref 7.5–12.5)
Monocytes Relative: 6.8 %
Neutro Abs: 3410 cells/uL (ref 1500–7800)
Neutrophils Relative %: 57.8 %
Platelets: 312 10*3/uL (ref 140–400)
RBC: 4.57 10*6/uL (ref 3.80–5.10)
RDW: 13.5 % (ref 11.0–15.0)
Total Lymphocyte: 31.3 %
WBC: 5.9 10*3/uL (ref 3.8–10.8)

## 2021-01-09 LAB — COMPLETE METABOLIC PANEL WITH GFR
AG Ratio: 1.4 (calc) (ref 1.0–2.5)
ALT: 14 U/L (ref 6–29)
AST: 23 U/L (ref 10–35)
Albumin: 4.3 g/dL (ref 3.6–5.1)
Alkaline phosphatase (APISO): 36 U/L — ABNORMAL LOW (ref 37–153)
BUN: 22 mg/dL (ref 7–25)
CO2: 29 mmol/L (ref 20–32)
Calcium: 9.4 mg/dL (ref 8.6–10.4)
Chloride: 103 mmol/L (ref 98–110)
Creat: 0.92 mg/dL (ref 0.60–1.00)
Globulin: 3 g/dL (calc) (ref 1.9–3.7)
Glucose, Bld: 125 mg/dL — ABNORMAL HIGH (ref 65–99)
Potassium: 4.5 mmol/L (ref 3.5–5.3)
Sodium: 138 mmol/L (ref 135–146)
Total Bilirubin: 0.4 mg/dL (ref 0.2–1.2)
Total Protein: 7.3 g/dL (ref 6.1–8.1)
eGFR: 66 mL/min/{1.73_m2} (ref >=60)

## 2021-02-05 ENCOUNTER — Telehealth: Payer: Self-pay | Admitting: Family Medicine

## 2021-02-05 NOTE — Telephone Encounter (Signed)
Left message for patient to schedule Annual Wellness Visit.  Please schedule with Nurse Health Advisor at [facility]. Please call [phone] ask for Kathy 

## 2021-02-07 DIAGNOSIS — Z96653 Presence of artificial knee joint, bilateral: Secondary | ICD-10-CM | POA: Diagnosis not present

## 2021-02-07 DIAGNOSIS — M25562 Pain in left knee: Secondary | ICD-10-CM | POA: Diagnosis not present

## 2021-02-19 DIAGNOSIS — M1612 Unilateral primary osteoarthritis, left hip: Secondary | ICD-10-CM | POA: Diagnosis not present

## 2021-02-21 ENCOUNTER — Other Ambulatory Visit: Payer: Self-pay | Admitting: Physician Assistant

## 2021-02-21 NOTE — Telephone Encounter (Signed)
Next Visit: 04/17/2021  Last Visit: 11/14/2020  Last Fill: 11/14/2020  DX: Rheumatoid arthritis of multiple sites with negative rheumatoid factor   Current Dose per office note 11/14/2020: Methotrexate 0.7 mL sq injections once weekly   Labs: 01/09/2021 CBC WNL.  Glucose is 125. Alk phos is borderline low.  We will continue to monitor. Rest of CMP WNL.    Okay to refill MTX?

## 2021-03-03 ENCOUNTER — Other Ambulatory Visit: Payer: Self-pay | Admitting: *Deleted

## 2021-03-03 MED ORDER — METHOTREXATE SODIUM CHEMO INJECTION (PF) 50 MG/2ML: INTRAMUSCULAR | 2 refills | Status: DC

## 2021-03-03 NOTE — Telephone Encounter (Signed)
We received a fax for a prior authorization on MTX 250 mg/10 mL. According to Cover my meds there is not prior authorization required for the preservative free MTX. Can we change the prescription the the preservative free MTX?

## 2021-03-07 ENCOUNTER — Telehealth: Payer: Self-pay | Admitting: Family Medicine

## 2021-03-07 NOTE — Telephone Encounter (Signed)
Attempted to schedule AWV. Unable to LVM.  Will try at later time.  

## 2021-03-24 ENCOUNTER — Other Ambulatory Visit: Payer: Self-pay

## 2021-03-24 ENCOUNTER — Other Ambulatory Visit (HOSPITAL_BASED_OUTPATIENT_CLINIC_OR_DEPARTMENT_OTHER): Payer: Self-pay

## 2021-03-24 ENCOUNTER — Ambulatory Visit: Payer: PPO | Attending: Internal Medicine

## 2021-03-24 DIAGNOSIS — Z23 Encounter for immunization: Secondary | ICD-10-CM

## 2021-03-24 MED ORDER — PFIZER COVID-19 VAC BIVALENT 30 MCG/0.3ML IM SUSP
INTRAMUSCULAR | 0 refills | Status: DC
  Filled 2021-03-24: qty 0.3, 1d supply, fill #0

## 2021-03-24 NOTE — Progress Notes (Signed)
   Covid-19 Vaccination Clinic  Name:  DARLEAN WARMOTH    MRN: [id number] DOB: 08-Oct-1945  03/24/2021  Ms. Goel was observed post Covid-19 immunization for 15 minutes without incident. She was provided with Vaccine Information Sheet and instruction to access the V-Safe system.   Ms. Laskowski was instructed to call 911 with any severe reactions post vaccine: Difficulty breathing  Swelling of face and throat  A fast heartbeat  A bad rash all over body  Dizziness and weakness   Immunizations Administered     Name Date Dose VIS Date Route   Pfizer Covid-19 Vaccine Bivalent Booster 03/24/2021 10:57 AM 0.3 mL 01/01/2021 Intramuscular   Manufacturer: [employer], [employer]   Lot: [id number]   NDC: [phone]

## 2021-04-04 NOTE — Progress Notes (Signed)
Office Visit Note  Patient: [name]             Date of Birth: 04/16/1946           MRN: [id number]             PCP: Natalia Leatherwood, DO Referring: Natalia Leatherwood, DO Visit Date: 04/17/2021 Occupation: @GUAROCC @  Subjective:  Other (Patient reports left 3rd digit trigger finger )   History of Present Illness: [name] is a 75 y.o. female with a history of rheumatoid arthritis and osteoarthritis.  She states she is tolerating methotrexate well.  She has not noticed any joint swelling.  She has some stiffness in the morning.  She continues to have left middle trigger finger.  Her both knee joints is replaced and are stiff.  Patient states that she recently developed an upper respiratory tract infection.  She stopped methotrexate for a week and restarted yesterday.  Activities of Daily Living:  Patient reports morning stiffness for 30-60 minutes.   Patient Denies nocturnal pain.  Difficulty dressing/grooming: Denies Difficulty climbing stairs: Denies Difficulty getting out of chair: Denies Difficulty using hands for taps, buttons, cutlery, and/or writing: Reports  Review of Systems  Constitutional:  Positive for fatigue.  HENT:  Negative for mouth sores, mouth dryness and nose dryness.   Eyes:  Negative for pain, itching and dryness.  Respiratory:  Negative for shortness of breath and difficulty breathing.   Cardiovascular:  Negative for chest pain and palpitations.  Gastrointestinal:  Negative for blood in stool, constipation and diarrhea.  Endocrine: Negative for increased urination.  Genitourinary:  Negative for difficulty urinating.  Musculoskeletal:  Positive for joint pain, joint pain, myalgias, morning stiffness, muscle tenderness and myalgias. Negative for joint swelling.  Skin:  Negative for color change, rash and redness.  Allergic/Immunologic: Negative for susceptible to infections.  Neurological:  Negative for dizziness, numbness, headaches, memory loss  and weakness.  Hematological:  Negative for bruising/bleeding tendency.  Psychiatric/Behavioral:  Negative for confusion.    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   Hypermetropia of both eyes 04/22/2018   Hyperopia with presbyopia 04/22/2018   Osteopenia 02/14/2016   Cataract associated with type 2 diabetes mellitus (HCC) 02/14/2016   Hypertension associated with diabetes (HCC) 02/16/2012   Occasional cigarette smoker 04/07/2010   Hyperlipidemia associated with type 2 diabetes mellitus (HCC) 03/18/2010    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   Diverticulitis    Family history of colonic polyps 05/16/2020   History of blood transfusion    Hyperlipidemia    Insomnia    Internal hemorrhoids 05/16/2020   Nasal polyps    Obesity    PONV (postoperative nausea and vomiting)    Radial styloid tenosynovitis of left hand 12/21/2019   Rectal bleeding 05/16/2020   Sinusitis, chronic 03/18/2010   Qualifier: Diagnosis of  By: 03/20/2010 MD, Ninetta Lights  History  Problem Relation Age of Onset   Arthritis Mother    Diabetes Mother        Type I   Aneurysm Father    Stroke Father    Arthritis Sister    Arthritis Sister    Down syndrome Son    Diabetes Son        type I   Past Surgical History:  Procedure Laterality Date   ABDOMINAL HYSTERECTOMY  2000   APPENDECTOMY     CARDIAC CATHETERIZATION N/A 03/27/2015   Procedure: Left Heart Cath and  Coronary Angiography;  Surgeon: Adrian Prows, MD;  Location: [facility];  Service: Cardiovascular;  Laterality: N/A;   CESAREAN SECTION     ELBOW SURGERY Right    FIXATION KYPHOPLASTY LUMBAR SPINE  10/16/2020   L2 and L3 kyphoplasty   KNEE ARTHROPLASTY     KYPHOPLASTY N/A 10/16/2020   Procedure: LUMBAR 2 AND LUMBAR 3 KYPHOPLASTY;  Surgeon: Phylliss Bob, MD;  Location: [facility];  Service: Orthopedics;  Laterality: N/A;   NASAL SINUS SURGERY     REPLACEMENT TOTAL KNEE Bilateral 2007 2009   TONSILLECTOMY AND ADENOIDECTOMY     WISDOM TOOTH EXTRACTION     Social History   Social History Narrative   Marital status/children/pets: Married, 1 child.    Education/employment: Secretary/administrator educated, retired.   Safety:      -smoke alarm in the home:Yes     - wears seatbelt: Yes     - Feels safe in their relationships: Yes   Immunization History  Administered Date(s) Administered   Fluad Quad(high Dose 65+) 01/11/2019, 02/15/2020   Influenza Whole 04/21/2010   Influenza, High Dose Seasonal PF 02/20/2013, 02/07/2018, 01/30/2021   Influenza,inj,Quad PF,6+ Mos 02/23/2017   Influenza-Unspecified 02/15/2014, 01/30/2016, 02/07/2018, 01/11/2019   PFIZER Comirnaty(Gray Top)Covid-19 Tri-Sucrose Vaccine 10/09/2020   PFIZER(Purple Top)SARS-COV-2 Vaccination 06/08/2019, 07/03/2019, 01/04/2020   Pfizer Covid-19 Vaccine Bivalent Booster 58yrs & up 03/24/2021   Pneumococcal Conjugate-13 02/20/2013   Pneumococcal Polysaccharide-23 04/05/2006   Pneumococcal-Unspecified 03/05/2015   Tdap 09/14/2001, 03/05/2015   Tetanus 03/27/2014   Zoster Recombinat (Shingrix) 10/12/2017, 02/07/2018   Zoster, Live 12/07/2013     Objective: Vital Signs: BP 109/77 (BP Location: Left Arm, Patient Position: Sitting, Cuff Size: Large)   Pulse 80   Ht 5\' 4"  (1.626 m)   Wt 203 lb 9.6 oz (92.4 kg)   BMI 34.95 kg/m    Physical Exam Vitals and nursing note reviewed.  Constitutional:      Appearance: She is well-developed.   HENT:     Head: Normocephalic and atraumatic.  Eyes:     Conjunctiva/sclera: Conjunctivae normal.  Cardiovascular:     Rate and Rhythm: Normal rate and regular rhythm.     Heart sounds: Normal heart sounds.  Pulmonary:     Effort: Pulmonary effort is normal.     Breath sounds: Normal breath sounds.  Abdominal:     General: Bowel sounds are normal.     Palpations: Abdomen is soft.  Musculoskeletal:     Cervical back: Normal range of motion.  Lymphadenopathy:     Cervical: No cervical adenopathy.  Skin:    General: Skin is warm and dry.     Capillary Refill: Capillary refill takes less than 2 seconds.  Neurological:     Mental Status: She is alert and oriented to person, place, and time.  Psychiatric:        Behavior: Behavior normal.     Musculoskeletal Exam: C-spine was in  good range of motion.  Shoulder joints, elbow joints, wrist joints with good range of motion.  There was no synovitis of her wrist joints or MCPs. PIP and DIP thickening was noted.  Thickening of the left middle flexor tendon was noted.  Hip joints in good range of motion.  Bilateral knee joints with good range of motion without any discomfort.  There was no tenderness over ankles or MTPs.  CDAI Exam: CDAI Score: 0.5  Patient Global: 3 mm; Provider Global: 2 mm Swollen: 0 ; Tender: 0  Joint Exam 04/17/2021   No joint exam has been documented for this visit   There is currently no information documented on the homunculus. Go to the Rheumatology activity and complete the homunculus joint exam.  Investigation: No additional findings.  Imaging: No results found.  Recent Labs: Lab Results  Component Value Date   WBC 5.9 01/09/2021   HGB 13.5 01/09/2021   PLT 312 01/09/2021   NA 138 01/09/2021   K 4.5 01/09/2021   CL 103 01/09/2021   CO2 29 01/09/2021   GLUCOSE 125 (H) 01/09/2021   BUN 22 01/09/2021   CREATININE 0.92 01/09/2021   BILITOT 0.4 01/09/2021   ALKPHOS 33 (L) 10/14/2020   AST 23  01/09/2021   ALT 14 01/09/2021   PROT 7.3 01/09/2021   ALBUMIN 4.0 10/14/2020   CALCIUM 9.4 01/09/2021   GFRAA 73 09/05/2020   QFTBGOLDPLUS NEGATIVE 06/28/2019    Speciality Comments: No specialty comments available.  Procedures:  No procedures performed Allergies: Lipitor [atorvastatin calcium]   Assessment / Plan:     Visit Diagnoses: Rheumatoid arthritis of multiple sites with negative rheumatoid factor (HCC)-she had no synovitis on my examination.  She has been tolerating methotrexate without any side effects.  We will continue current treatment.  She had recent upper respiratory tract infection for which she stopped methotrexate for 1 week.  High risk medication use - Methotrexate 0.7 mL sq injections once weekly and folic acid 2 mg by mouth daily.  Labs obtained on January 09, 2021 were reviewed which were within normal limits.  We will check labs today and then every 3 months to monitor for drug toxicity.  She has been advised to hold methotrexate if she develops any infection and resume after the infection resolves.  Information regarding immunization was also placed in the AVS.- Plan: CBC with Differential/Platelet, COMPLETE METABOLIC PANEL WITH GFR  Trigger finger, left middle finger-she continues to have symptoms of left middle trigger finger.  I offered cortisone injection which she declined.  She states she will call us when she is ready.  Her sister is visiting from [place] and will stay with her for the next month.  Primary osteoarthritis of both hands-joint protection muscle strengthening was discussed.  Status post total bilateral knee replacement -by Dr.Dalldorf.  She had good range of motion of bilateral knee joints without discomfort.  Primary osteoarthritis of both feet-proper fitting shoes were discussed.  Osteopenia of multiple sites - DEXA on 12/14/17: The BMD measured at Femur Neck Right is 0.971 g/cm2 with a T-score of -0.5. kyphoplasty for L2 and L3 on 10/16/2020  performed by Dr. Lynann Bologna.  She plans to get repeat DEXA scan through her PCP.  The lumbar spine fracture was secondary to trauma per patient.  Vitamin D deficiency-use of vitamin D was emphasized.  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    Follow-Up Instructions: Return in about 5 months (around 09/15/2021) for Rheumatoid arthritis, Osteoarthritis.   Bo Merino, MD  Note - This record has been created using Editor, commissioning.  Chart creation errors have been sought, but may not always  have been located. Such creation errors do not reflect on  the standard of medical care.

## 2021-04-17 ENCOUNTER — Other Ambulatory Visit: Payer: Self-pay

## 2021-04-17 ENCOUNTER — Ambulatory Visit: Payer: PPO | Admitting: Rheumatology

## 2021-04-17 ENCOUNTER — Encounter: Payer: Self-pay | Admitting: Rheumatology

## 2021-04-17 VITALS — BP 109/77 | HR 80 | Ht 64.0 in | Wt 203.6 lb

## 2021-04-17 DIAGNOSIS — Z79899 Other long term (current) drug therapy: Secondary | ICD-10-CM

## 2021-04-17 DIAGNOSIS — M8589 Other specified disorders of bone density and structure, multiple sites: Secondary | ICD-10-CM

## 2021-04-17 DIAGNOSIS — E1169 Type 2 diabetes mellitus with other specified complication: Secondary | ICD-10-CM

## 2021-04-17 DIAGNOSIS — M19072 Primary osteoarthritis, left ankle and foot: Secondary | ICD-10-CM

## 2021-04-17 DIAGNOSIS — Z789 Other specified health status: Secondary | ICD-10-CM

## 2021-04-17 DIAGNOSIS — M19071 Primary osteoarthritis, right ankle and foot: Secondary | ICD-10-CM

## 2021-04-17 DIAGNOSIS — I152 Hypertension secondary to endocrine disorders: Secondary | ICD-10-CM

## 2021-04-17 DIAGNOSIS — H47013 Ischemic optic neuropathy, bilateral: Secondary | ICD-10-CM

## 2021-04-17 DIAGNOSIS — Z720 Tobacco use: Secondary | ICD-10-CM

## 2021-04-17 DIAGNOSIS — E1136 Type 2 diabetes mellitus with diabetic cataract: Secondary | ICD-10-CM

## 2021-04-17 DIAGNOSIS — M65332 Trigger finger, left middle finger: Secondary | ICD-10-CM | POA: Diagnosis not present

## 2021-04-17 DIAGNOSIS — M19042 Primary osteoarthritis, left hand: Secondary | ICD-10-CM

## 2021-04-17 DIAGNOSIS — T466X5A Adverse effect of antihyperlipidemic and antiarteriosclerotic drugs, initial encounter: Secondary | ICD-10-CM

## 2021-04-17 DIAGNOSIS — E785 Hyperlipidemia, unspecified: Secondary | ICD-10-CM

## 2021-04-17 DIAGNOSIS — M0609 Rheumatoid arthritis without rheumatoid factor, multiple sites: Secondary | ICD-10-CM | POA: Diagnosis not present

## 2021-04-17 DIAGNOSIS — E118 Type 2 diabetes mellitus with unspecified complications: Secondary | ICD-10-CM

## 2021-04-17 DIAGNOSIS — R5383 Other fatigue: Secondary | ICD-10-CM | POA: Diagnosis not present

## 2021-04-17 DIAGNOSIS — M19041 Primary osteoarthritis, right hand: Secondary | ICD-10-CM | POA: Diagnosis not present

## 2021-04-17 DIAGNOSIS — Z96653 Presence of artificial knee joint, bilateral: Secondary | ICD-10-CM

## 2021-04-17 DIAGNOSIS — Z8709 Personal history of other diseases of the respiratory system: Secondary | ICD-10-CM

## 2021-04-17 DIAGNOSIS — E1159 Type 2 diabetes mellitus with other circulatory complications: Secondary | ICD-10-CM | POA: Diagnosis not present

## 2021-04-17 DIAGNOSIS — E559 Vitamin D deficiency, unspecified: Secondary | ICD-10-CM

## 2021-04-17 DIAGNOSIS — M791 Myalgia, unspecified site: Secondary | ICD-10-CM

## 2021-04-17 NOTE — Patient Instructions (Signed)
Standing Labs °We placed an order today for your standing lab work.  ° °Please have your standing labs drawn in March and every 3 months ° °If possible, please have your labs drawn 2 weeks prior to your appointment so that the provider can discuss your results at your appointment. ° °Please note that you may see your imaging and lab results in MyChart before we have reviewed them. °We may be awaiting multiple results to interpret others before contacting you. °Please allow our office up to 72 hours to thoroughly review all of the results before contacting the office for clarification of your results. ° °We have open lab daily: °Monday through Thursday from 1:30-4:30 PM and Friday from 1:30-4:00 PM °at the office of Dr. Courtlynn Holloman, [facility].   °Please be advised, all patients with office appointments requiring lab work will take precedent over walk-in lab work.  °If possible, please come for your lab work on Monday and Friday afternoons, as you may experience shorter wait times. °The office is located at [address], [address], [place], [place] [address] °No appointment is necessary.   °Labs are drawn by [facility]. Please bring your co-pay at the time of your lab draw.  You may receive a bill from [facility] for your lab work. ° °If you wish to have your labs drawn at another location, please call the office 24 hours in advance to send orders. ° °If you have any questions regarding directions or hours of operation,  °please call [phone].   °As a reminder, please drink plenty of water prior to coming for your lab work. Thanks!  ° °Vaccines °You are taking a medication(s) that can suppress your immune system.  The following immunizations are recommended: °Flu annually °Covid-19  °Td/Tdap (tetanus, diphtheria, pertussis) every 10 years °Pneumonia (Prevnar 15 then Pneumovax 23 at least 1 year apart.  Alternatively, can take Prevnar 20 without needing additional dose) °Shingrix: 2 doses from 4 weeks  to 6 months apart ° °Please check with your PCP to make sure you are up to date.  ° °If you have signs or symptoms of an infection or start antibiotics: °First, call your PCP for workup of your infection. °Hold your medication through the infection, until you complete your antibiotics, and until symptoms resolve if you take the following: °Injectable medication (Actemra, Benlysta, Cimzia, Cosentyx, Enbrel, Humira, Kevzara, Orencia, Remicade, Simponi, Stelara, Taltz, Tremfya) °Methotrexate °Leflunomide (Arava) °Mycophenolate (Cellcept) °Xeljanz, Olumiant, or Rinvoq  °

## 2021-04-18 LAB — CBC WITH DIFFERENTIAL/PLATELET
Absolute Monocytes: 481 cells/uL (ref 200–950)
Basophils Absolute: 59 cells/uL (ref 0–200)
Basophils Relative: 0.8 %
Eosinophils Absolute: 207 cells/uL (ref 15–500)
Eosinophils Relative: 2.8 %
HCT: 42 % (ref 35.0–45.0)
Hemoglobin: 14 g/dL (ref 11.7–15.5)
Lymphs Abs: 2013 cells/uL (ref 850–3900)
MCH: 30 pg (ref 27.0–33.0)
MCHC: 33.3 g/dL (ref 32.0–36.0)
MCV: 89.9 fL (ref 80.0–100.0)
MPV: 9.7 fL (ref 7.5–12.5)
Monocytes Relative: 6.5 %
Neutro Abs: 4640 cells/uL (ref 1500–7800)
Neutrophils Relative %: 62.7 %
Platelets: 338 10*3/uL (ref 140–400)
RBC: 4.67 10*6/uL (ref 3.80–5.10)
RDW: 13.6 % (ref 11.0–15.0)
Total Lymphocyte: 27.2 %
WBC: 7.4 10*3/uL (ref 3.8–10.8)

## 2021-04-18 LAB — COMPLETE METABOLIC PANEL WITH GFR
AG Ratio: 1.4 (calc) (ref 1.0–2.5)
ALT: 17 U/L (ref 6–29)
AST: 24 U/L (ref 10–35)
Albumin: 4.4 g/dL (ref 3.6–5.1)
Alkaline phosphatase (APISO): 38 U/L (ref 37–153)
BUN: 19 mg/dL (ref 7–25)
CO2: 26 mmol/L (ref 20–32)
Calcium: 9.2 mg/dL (ref 8.6–10.4)
Chloride: 103 mmol/L (ref 98–110)
Creat: 0.8 mg/dL (ref 0.60–1.00)
Globulin: 3.2 g/dL (calc) (ref 1.9–3.7)
Glucose, Bld: 82 mg/dL (ref 65–99)
Potassium: 4.5 mmol/L (ref 3.5–5.3)
Sodium: 138 mmol/L (ref 135–146)
Total Bilirubin: 0.5 mg/dL (ref 0.2–1.2)
Total Protein: 7.6 g/dL (ref 6.1–8.1)
eGFR: 78 mL/min/{1.73_m2} (ref >=60)

## 2021-04-22 ENCOUNTER — Other Ambulatory Visit: Payer: Self-pay | Admitting: Rheumatology

## 2021-04-22 DIAGNOSIS — M0609 Rheumatoid arthritis without rheumatoid factor, multiple sites: Secondary | ICD-10-CM

## 2021-04-23 ENCOUNTER — Other Ambulatory Visit: Payer: Self-pay

## 2021-04-23 DIAGNOSIS — E1159 Type 2 diabetes mellitus with other circulatory complications: Secondary | ICD-10-CM

## 2021-04-23 MED ORDER — LOSARTAN POTASSIUM 25 MG PO TABS: 12.5000 mg | ORAL_TABLET | Freq: Every day | ORAL | 0 refills | Status: DC

## 2021-04-23 NOTE — Telephone Encounter (Signed)
Next Visit: 09/17/2021  Last Visit: 04/17/2021  Last Fill: 04/15/2020  Dx: Rheumatoid arthritis of multiple sites with negative rheumatoid factor  Current Dose per office note on 04/17/2021: folic acid 2 mg by mouth daily  Okay to refill Folic Acid?

## 2021-05-13 ENCOUNTER — Encounter: Payer: Self-pay | Admitting: Family Medicine

## 2021-05-13 ENCOUNTER — Ambulatory Visit (INDEPENDENT_AMBULATORY_CARE_PROVIDER_SITE_OTHER): Payer: PPO | Admitting: Family Medicine

## 2021-05-13 ENCOUNTER — Other Ambulatory Visit: Payer: Self-pay

## 2021-05-13 VITALS — BP 110/71 | HR 81 | Temp 97.7°F | Ht 64.0 in | Wt 202.0 lb

## 2021-05-13 DIAGNOSIS — M0609 Rheumatoid arthritis without rheumatoid factor, multiple sites: Secondary | ICD-10-CM

## 2021-05-13 DIAGNOSIS — I10 Essential (primary) hypertension: Secondary | ICD-10-CM

## 2021-05-13 DIAGNOSIS — G479 Sleep disorder, unspecified: Secondary | ICD-10-CM

## 2021-05-13 DIAGNOSIS — E559 Vitamin D deficiency, unspecified: Secondary | ICD-10-CM

## 2021-05-13 DIAGNOSIS — Z6834 Body mass index (BMI) 34.0-34.9, adult: Secondary | ICD-10-CM

## 2021-05-13 DIAGNOSIS — Z23 Encounter for immunization: Secondary | ICD-10-CM

## 2021-05-13 DIAGNOSIS — E1169 Type 2 diabetes mellitus with other specified complication: Secondary | ICD-10-CM

## 2021-05-13 DIAGNOSIS — E785 Hyperlipidemia, unspecified: Secondary | ICD-10-CM

## 2021-05-13 LAB — LIPID PANEL
Cholesterol: 180 mg/dL (ref 0–200)
HDL: 46.8 mg/dL (ref >39.00)
LDL Cholesterol: 105 mg/dL — ABNORMAL HIGH (ref 0–99)
NonHDL: 133.58
Total CHOL/HDL Ratio: 4
Triglycerides: 143 mg/dL (ref 0.0–149.0)
VLDL: 28.6 mg/dL (ref 0.0–40.0)

## 2021-05-13 LAB — POCT GLYCOSYLATED HEMOGLOBIN (HGB A1C)
HbA1c POC (<> result, manual entry): 6.5 % (ref 4.0–5.6)
HbA1c, POC (controlled diabetic range): 6.5 % (ref 0.0–7.0)
HbA1c, POC (prediabetic range): 6.5 % — AB (ref 5.7–6.4)
Hemoglobin A1C: 6.5 % — AB (ref 4.0–5.6)

## 2021-05-13 LAB — TSH: TSH: 0.67 u[IU]/mL (ref 0.35–5.50)

## 2021-05-13 LAB — VITAMIN D 25 HYDROXY (VIT D DEFICIENCY, FRACTURES): VITD: 40.99 ng/mL (ref 30.00–100.00)

## 2021-05-13 MED ORDER — LOSARTAN POTASSIUM 25 MG PO TABS: 12.5000 mg | ORAL_TABLET | Freq: Every day | ORAL | 1 refills | Status: DC

## 2021-05-13 MED ORDER — ZOLPIDEM TARTRATE 5 MG PO TABS: 5.0000 mg | ORAL_TABLET | Freq: Every evening | ORAL | 1 refills | Status: DC | PRN

## 2021-05-13 MED ORDER — SITAGLIPTIN PHOSPHATE 50 MG PO TABS: 50.0000 mg | ORAL_TABLET | Freq: Every day | ORAL | 1 refills | Status: DC

## 2021-05-13 MED ORDER — METFORMIN HCL 500 MG PO TABS: 500.0000 mg | ORAL_TABLET | Freq: Two times a day (BID) | ORAL | 1 refills | Status: DC

## 2021-05-13 MED ORDER — PRAVASTATIN SODIUM 20 MG PO TABS: 20.0000 mg | ORAL_TABLET | Freq: Every day | ORAL | 3 refills | Status: DC

## 2021-05-13 MED ORDER — FENOFIBRATE 160 MG PO TABS: 160.0000 mg | ORAL_TABLET | Freq: Every day | ORAL | 3 refills | Status: DC

## 2021-05-13 NOTE — Patient Instructions (Signed)
°  Great to see you today.  I have refilled the medication(s) we provide.   If labs were collected, we will inform you of lab results once received either by echart message or telephone call.   - echart message- for normal results that have been seen by the patient already.   - telephone call: abnormal results or if patient has not viewed results in their echart.  Please schedule your next appt as your physical and chronic conditions end of June.

## 2021-05-13 NOTE — Progress Notes (Signed)
This visit occurred during the SARS-CoV-2 public health emergency.  Safety protocols were in place, including screening questions prior to the visit, additional usage of staff PPE, and extensive cleaning of exam room while observing appropriate contact time as indicated for disinfecting solutions.    Patient ID: [name], female  DOB: 10-08-1945, 76 y.o.   MRN: [id number] Patient Care Team    Relationship Specialty Notifications Start End  Natalia LeatherwoodKuneff, Page Lancon A, DO PCP - General Family Medicine  10/10/19   Pollyann Savoyeveshwar, Shaili, MD Consulting Physician Rheumatology  10/10/19   Charna ElizabethMann, Jyothi, MD Consulting Physician Gastroenterology  10/10/19   Renae FickleBull, Thomas Anthony, OD  Optometry  10/10/19     Chief Complaint  Patient presents with   Diabetes    CMC; pt is fasting    Subjective: Jennifer BridgeMarita M Longley is a 76 y.o.  Female  [facility] Hypertension associated with diabetes (HCC)/obesity/statin intolerance/hyperlipidemia Pt reports compliance  with losartan 12.5 mg daily, pravastatin and fenofibrate. Blood pressures ranges at home not routinely checked. Patient denies chest pain, shortness of breath, dizziness or lower extremity edema.  Pt takes a daily baby ASA.       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  Patient is prescribed methotrexate and folate.   Type 2 diabetes mellitus with unspecified complications (HCC)/cataracts associated with type 2 diabetes Pt reports compliance  with metformin 500 mg twice daily and Januvia.  She reports she was diagnosed with diabetes when she was around 52.  Patient denies dizziness, hyperglycemic or hypoglycemic events. Patient denies numbness, tingling in the extremities or nonhealing wounds of feet.    vitamin D deficiency Vitamin D levels are in normal range.  She is tolerating supplement 2000 un it daily.    Sleep disturbance Patient reports she is prescribed Ambien 5 mg nightly as  needed.  She very rarely takes this medication.  When she does she takes the medication she takes one third of a tab.  States she has had the same bottle for over a year with 30 tabs in it.  She request refills on this today.  [facility] 02/21/2020 02/15/2020 10/26/2019 10/18/2018 02/14/2018  Decreased Interest 0 0 0 - 0  Down, Depressed, Hopeless 0 0 0 0 0  PHQ - 2 Score 0 0 0 0 0   No flowsheet data found.   Immunization History  Administered Date(s) Administered   Fluad Quad(high Dose 65+) 01/11/2019, 02/15/2020   Influenza Whole 04/21/2010   Influenza, High Dose Seasonal PF 02/20/2013, 02/07/2018, 01/30/2021   Influenza,inj,Quad PF,6+ Mos 02/23/2017   Influenza-Unspecified 02/15/2014, 01/30/2016, 02/07/2018, 01/11/2019   PFIZER Comirnaty(Gray Top)Covid-19 Tri-Sucrose Vaccine 10/09/2020   PFIZER(Purple Top)SARS-COV-2 Vaccination 06/08/2019, 07/03/2019, 01/04/2020   PNEUMOCOCCAL CONJUGATE-20 05/13/2021   Pfizer Covid-19 Vaccine Bivalent Booster 7052yrs & up 03/24/2021   Pneumococcal Conjugate-13 02/20/2013   Pneumococcal Polysaccharide-23 04/05/2006   Pneumococcal-Unspecified 03/05/2015   Tdap 09/14/2001, 03/05/2015   Tetanus 03/27/2014   Zoster Recombinat (Shingrix) 10/12/2017, 02/07/2018   Zoster, Live 12/07/2013     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   Diverticulitis    Family history of colonic polyps 05/16/2020   History of blood transfusion  Hyperlipidemia    Insomnia    Internal hemorrhoids 05/16/2020   Nasal polyps    Obesity    PONV (postoperative nausea and vomiting)    Radial styloid tenosynovitis of left hand 12/21/2019   Rectal bleeding 05/16/2020   Sinusitis, chronic 03/18/2010   Qualifier: Diagnosis of  By:  Ninetta Lights MD, Tinnie Gens     Allergies  Allergen Reactions   Lipitor [Atorvastatin Calcium]     Myalgia   Past Surgical History:  Procedure Laterality Date   ABDOMINAL HYSTERECTOMY  2000   APPENDECTOMY     CARDIAC CATHETERIZATION N/A 03/27/2015   Procedure: Left Heart Cath and Coronary Angiography;  Surgeon: Yates Decamp, MD;  Location: [facility] INVASIVE CV LAB;  Service: Cardiovascular;  Laterality: N/A;   CESAREAN SECTION     ELBOW SURGERY Right    FIXATION KYPHOPLASTY LUMBAR SPINE  10/16/2020   L2 and L3 kyphoplasty   KNEE ARTHROPLASTY     KYPHOPLASTY N/A 10/16/2020   Procedure: LUMBAR 2 AND LUMBAR 3 KYPHOPLASTY;  Surgeon: Estill Bamberg, MD;  Location: [facility] OR;  Service: Orthopedics;  Laterality: N/A;   NASAL SINUS SURGERY     REPLACEMENT TOTAL KNEE Bilateral 2007 2009   TONSILLECTOMY AND ADENOIDECTOMY     WISDOM TOOTH EXTRACTION     Family History  Problem Relation Age of Onset   Arthritis Mother    Diabetes Mother        Type I   Aneurysm Father    Stroke Father    Arthritis Sister    Arthritis Sister    Down syndrome Son    Diabetes Son        type I   Social History   Social History Narrative   Marital status/children/pets: Married, 1 child.    Education/employment: Automotive engineer educated, retired.   Safety:      -smoke alarm in the home:Yes     - wears seatbelt: Yes     - Feels safe in their relationships: Yes    Allergies as of 05/13/2021       Reactions   Lipitor [atorvastatin Calcium]    Myalgia        Medication List        Accurate as of May 13, 2021 10:18 AM. If you have any questions, ask your nurse or doctor.          STOP taking these medications    Pfizer COVID-19 Vac Bivalent injection Generic drug: COVID-19 mRNA bivalent vaccine Proofreader) Stopped by: Felix Pacini, DO       TAKE these medications    cholecalciferol 25 MCG (1000 UNIT) tablet Commonly known as: VITAMIN D3 Take 1,000 Units by mouth daily.   fenofibrate 160 MG tablet Take  1 tablet (160 mg total) by mouth daily.   FISH OIL PO Take by mouth daily.   folic acid 1 MG tablet Commonly known as: FOLVITE TAKE 2 TABLETS BY MOUTH EVERY DAY   losartan 25 MG tablet Commonly known as: COZAAR Take 0.5 tablets (12.5 mg total) by mouth daily.   metFORMIN 500 MG tablet Commonly known as: GLUCOPHAGE Take 1 tablet (500 mg total) by mouth 2 (two) times daily.   methotrexate 250 MG/10ML injection INJECT 0.7 MLS (17.5 MG TOTAL) INTO THE SKIN ONCE A WEEK.   methotrexate (PF) 50 MG/2ML injection INJECT 0.7 MLS (17.5 MG TOTAL) INTO THE SKIN ONCE A WEEK.   pravastatin 20 MG tablet Commonly known as: PRAVACHOL Take 1 tablet (20 mg total) by mouth daily.  sitaGLIPtin 50 MG tablet Commonly known as: JANUVIA Take 1 tablet (50 mg total) by mouth daily.   Tuberculin-Allergy Syringes 27G X 1/2" 1 ML Misc Use 1 syringe once weekly to inject methotrexate.   zolpidem 5 MG tablet Commonly known as: AMBIEN Take 1 tablet (5 mg total) by mouth at bedtime as needed for sleep.        All past medical history, surgical history, allergies, family history, immunizations andmedications were updated in the EMR today and reviewed under the history and medication portions of their EMR.      ROS: 14 pt review of systems performed and negative (unless mentioned in an HPI)  Objective: BP 110/71    Pulse 81    Temp 97.7 F (36.5 C) (Oral)    Ht 5\' 4"  (1.626 m)    Wt 202 lb (91.6 kg)    SpO2 97%    BMI 34.67 kg/m  Physical Exam Vitals and nursing note reviewed.  Constitutional:      General: She is not in acute distress.    Appearance: Normal appearance. She is not ill-appearing, toxic-appearing or diaphoretic.  HENT:     Head: Normocephalic and atraumatic.     Mouth/Throat:     Mouth: Mucous membranes are moist.  Eyes:     General: No scleral icterus.       Right eye: No discharge.        Left eye: No discharge.     Extraocular Movements: Extraocular movements intact.      Conjunctiva/sclera: Conjunctivae normal.     Pupils: Pupils are equal, round, and reactive to light.  Cardiovascular:     Rate and Rhythm: Normal rate and regular rhythm.  Pulmonary:     Effort: Pulmonary effort is normal. No respiratory distress.     Breath sounds: Normal breath sounds. No wheezing, rhonchi or rales.  Musculoskeletal:     Cervical back: Neck supple. No tenderness.  Lymphadenopathy:     Cervical: No cervical adenopathy.  Skin:    General: Skin is warm and dry.     Coloration: Skin is not jaundiced or pale.     Findings: No erythema or rash.  Neurological:     Mental Status: She is alert and oriented to person, place, and time. Mental status is at baseline.     Motor: No weakness.     Gait: Gait normal.  Psychiatric:        Mood and Affect: Mood normal.        Behavior: Behavior normal.        Thought Content: Thought content normal.        Judgment: Judgment normal.      Results for orders placed or performed in visit on 05/13/21 (from the past 24 hour(s))  POCT HgB A1C     Status: Abnormal   Collection Time: 05/13/21  9:34 AM  Result Value Ref Range   Hemoglobin A1C 6.5 (A) 4.0 - 5.6 %   HbA1c POC (<> result, manual entry) 6.5 4.0 - 5.6 %   HbA1c, POC (prediabetic range) 6.5 (A) 5.7 - 6.4 %   HbA1c, POC (controlled diabetic range) 6.5 0.0 - 7.0 %     No results found.  Assessment/plan: Jennifer BridgeMarita M Byard is a 76 y.o. female present for  Hypertension/Hyperlipidemia  -stable.  - continue losartan 12.5 mg qd Continue  pravastatin -Continue omega-3 and fenofibrate. -Labs lipid, tsh collected today  Osteopenia of multiple sites/vitamin D deficiency Stable.  Continue vitamin D  2000 units daily> levels collected today  diabetes (HCC)/obesity/cataracts associated with diabetes Stable.  Continue metformin 500 mg twice daily for now> had SE to higher dose.  Continue Januvia 50 mg daily Continue routine exercise and low-sodium diet. 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  She is managed closely by her ophthalmology team Dr. Antionette Char has APPT in April 2022- retinopathy present.  A1c:7.4>>7.6>6.5>6.4> 6.5 today Follow-up in 5 months with other chronic conditions.   Rheumatoid arthritis of multiple sites with negative rheumatoid factor (HCC) Prescribed methotrexate and folate managed by rheumatology.Stable.   Sleep disturbance: Stable.  Continue Ambien half tab as needed-rarely uses medication. Refilled today [place] controlled substance database reviewed     Return in about 24 weeks (around 10/28/2021) for CPE (30 min), [facility]MC (30 min).  Orders Placed This Encounter  Procedures   Pneumococcal conjugate vaccine 20-valent (Prevnar 20)   Lipid panel   TSH   Vitamin D (25 hydroxy)   POCT HgB A1C   Meds ordered this encounter  Medications   losartan (COZAAR) 25 MG tablet    Sig: Take 0.5 tablets (12.5 mg total) by mouth daily.    Dispense:  45 tablet    Refill:  1   metFORMIN (GLUCOPHAGE) 500 MG tablet    Sig: Take 1 tablet (500 mg total) by mouth 2 (two) times daily.    Dispense:  180 tablet    Refill:  1   sitaGLIPtin (JANUVIA) 50 MG tablet    Sig: Take 1 tablet (50 mg total) by mouth daily.    Dispense:  90 tablet    Refill:  1   fenofibrate 160 MG tablet    Sig: Take 1 tablet (160 mg total) by mouth daily.    Dispense:  90 tablet    Refill:  3   pravastatin (PRAVACHOL) 20 MG tablet    Sig: Take 1 tablet (20 mg total) by mouth daily.    Dispense:  90 tablet    Refill:  3   zolpidem (AMBIEN) 5 MG tablet    Sig: Take 1 tablet (5 mg total) by mouth at bedtime as needed for sleep.    Dispense:  90 tablet    Refill:  1    Referral Orders  No referral(s) requested today     Electronically signed by: Felix Pacini,  DO [facility] [employer]

## 2021-05-28 ENCOUNTER — Other Ambulatory Visit: Payer: Self-pay | Admitting: Physician Assistant

## 2021-05-28 NOTE — Telephone Encounter (Signed)
Next Visit: 09/17/2021   Last Visit: 04/17/2021   Last Fill: 03/03/2021  Dx: Rheumatoid arthritis of multiple sites with negative rheumatoid factor   Current Dose per office note on 04/17/2021: Methotrexate 0.7 mL sq injections once weekly   Labs: 04/17/2021 CBC and CMP WNL  Okay to refill MTX?

## 2021-06-10 ENCOUNTER — Telehealth: Payer: Self-pay

## 2021-06-10 NOTE — Telephone Encounter (Signed)
Called pt to schedule AWV with health coach.

## 2021-06-18 ENCOUNTER — Other Ambulatory Visit: Payer: Self-pay | Admitting: Physician Assistant

## 2021-07-11 ENCOUNTER — Telehealth: Payer: Self-pay | Admitting: Family Medicine

## 2021-07-11 NOTE — Telephone Encounter (Signed)
Left message for patient to schedule Annual Wellness Visit.  Please schedule (telephone/video call) with Nurse Health Advisor Tina Betterson, RN at [facility]. Please call [phone] ask for Kathy 

## 2021-07-30 ENCOUNTER — Other Ambulatory Visit: Payer: Self-pay

## 2021-07-30 ENCOUNTER — Ambulatory Visit (INDEPENDENT_AMBULATORY_CARE_PROVIDER_SITE_OTHER): Payer: PPO

## 2021-07-30 DIAGNOSIS — Z0000 Encounter for general adult medical examination without abnormal findings: Secondary | ICD-10-CM | POA: Diagnosis not present

## 2021-07-30 NOTE — Progress Notes (Signed)
Virtual Visit via Telephone Note ? ?I connected with  [name] on 07/30/21 at 11:00 AM EDT by telephone and verified that I am speaking with the correct person using two identifiers. ? ?Medicare Annual Wellness visit completed telephonically due to Covid-19 pandemic.  ? ?Persons participating in this call: This Health Coach and this patient.  ? ?Location: ?Patient: Home ?Provider: Office  ?  ?I discussed the limitations, risks, security and privacy concerns of performing an evaluation and management service by telephone and the availability of in person appointments. The patient expressed understanding and agreed to proceed. ? ?Unable to perform video visit due to video visit attempted and failed and/or patient does not have video capability.  ? ?Some vital signs may be absent or patient reported.  ? ?[name] H Adeana Grilliot, LPN ? ? ?Subjective:  ? [name] is a 76 y.o. female who presents for Medicare Annual (Subsequent) preventive examination. ? ?Review of Systems    ? ?Cardiac Risk Factors include: diabetes mellitus;hypertension;dyslipidemia;obesity (BMI >30kg/m2);advanced age (>6255men, 33>65 women);smoking/ tobacco exposure ? ?   ?Objective:  ?  ?There were no vitals filed for this visit. ?There is no height or weight on file to calculate BMI. ? ? ?  07/30/2021  ? 11:08 AM 02/21/2020  ?  8:20 AM 10/18/2018  ? 10:13 AM 08/05/2015  ? 10:11 AM 03/27/2015  ?  4:25 PM 10/19/2014  ?  2:50 PM  ?Advanced Directives  ?Does Patient Have a Medical Advance Directive? Yes Yes Yes Yes Yes No  ?Type of Estate agentAdvance Directive Healthcare Power of State Street Corporationttorney Healthcare Power of [place];Living will Healthcare Power of [place];Living will Healthcare Power of [place];Living will Living will;Healthcare Power of Attorney   ?Does patient want to make changes to medical advance directive?   No - Patient declined     ?Copy of Healthcare Power of Attorney in Chart? Yes - validated most recent copy scanned in chart (See row information) Yes -  validated most recent copy scanned in chart (See row information) Yes - validated most recent copy scanned in chart (See row information) No - copy requested    ? ? ?Current Medications (verified) ?Outpatient Encounter Medications as of 07/30/2021  ?Medication Sig  ? cholecalciferol (VITAMIN D3) 25 MCG (1000 UNIT) tablet Take 1,000 Units by mouth daily.  ? fenofibrate 160 MG tablet Take 1 tablet (160 mg total) by mouth daily.  ? folic acid (FOLVITE) 1 MG tablet TAKE 2 TABLETS BY MOUTH EVERY DAY  ? losartan (COZAAR) 25 MG tablet Take 0.5 tablets (12.5 mg total) by mouth daily.  ? metFORMIN (GLUCOPHAGE) 500 MG tablet Take 1 tablet (500 mg total) by mouth 2 (two) times daily.  ? Methotrexate Sodium (METHOTREXATE, PF,) 50 MG/2ML injection INJECT 0.7 MLS (17.5 MG TOTAL) INTO THE SKIN ONCE A WEEK.  ? Omega-3 Fatty Acids (FISH OIL PO) Take by mouth daily.  ? pravastatin (PRAVACHOL) 20 MG tablet Take 1 tablet (20 mg total) by mouth daily.  ? sitaGLIPtin (JANUVIA) 50 MG tablet Take 1 tablet (50 mg total) by mouth daily.  ? Tuberculin-Allergy Syringes 27G X 1/2" 1 ML MISC Use 1 syringe once weekly to inject methotrexate.  ? zolpidem (AMBIEN) 5 MG tablet Take 1 tablet (5 mg total) by mouth at bedtime as needed for sleep.  ? [DISCONTINUED] methotrexate 250 MG/10ML injection INJECT 0.7 MLS (17.5 MG TOTAL) INTO THE SKIN ONCE A WEEK.  ? ?No facility-administered encounter medications on file as of 07/30/2021.  ? ? ?Allergies (verified) ?Lipitor [  atorvastatin calcium]  ? ?History: ?Past Medical History:  ?Diagnosis Date  ? Allergy   ? Arthritis   ? Chicken pox   ? Colon polyp   ? COPD (chronic obstructive pulmonary disease) (HCC)   ? CVA (cerebral infarction)   ? Diabetes mellitus without complication (HCC)   ? 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 Diverticulitis   ? Family history of colonic polyps 05/16/2020  ? History of blood transfusion   ? Hyperlipidemia   ? Insomnia   ? Internal hemorrhoids 05/16/2020  ? Nasal polyps   ?  Obesity   ? PONV (postoperative nausea and vomiting)   ? Radial styloid tenosynovitis of left hand 12/21/2019  ? Rectal bleeding 05/16/2020  ? Sinusitis, chronic 03/18/2010  ? Qualifier: Diagnosis of  By: Ninetta Lights MD, Tinnie Gens    ? ?Past Surgical History:  ?Procedure Laterality Date  ? ABDOMINAL HYSTERECTOMY  2000  ? APPENDECTOMY    ? CARDIAC CATHETERIZATION N/A 03/27/2015  ? Procedure: Left Heart Cath and Coronary Angiography;  Surgeon: Yates Decamp, MD;  Location[facility] INVASIVE CV LAB;  Service: Cardiovascular;  Laterality: N/A;  ? CESAREAN SECTION    ? ELBOW SURGERY Right   ? FIXATION KYPHOPLASTY LUMBAR SPINE  10/16/2020  ? L2 and L3 kyphoplasty  ? KNEE ARTHROPLASTY    ? KYPHOPLASTY N/A 10/16/2020  ? Procedure: LUMBAR 2 AND LUMBAR 3 KYPHOPLASTY;  Surgeon: Estill Bamberg, MD;  Location: [facility] OR;  Service: Orthopedics;  Laterality: N/A;  ? NASAL SINUS SURGERY    ? REPLACEMENT TOTAL KNEE Bilateral 2007 2009  ? TONSILLECTOMY AND ADENOIDECTOMY    ? WISDOM TOOTH EXTRACTION    ? ?Family History  ?Problem Relation Age of Onset  ? Arthritis Mother   ? Diabetes Mother   ?     Type I  ? Aneurysm Father   ? Stroke Father   ? Arthritis Sister   ? Arthritis Sister   ? Down syndrome Son   ? Diabetes Son   ?     type I  ? ?Social History  ? ?Socioeconomic History  ? Marital status: Married  ?  Spouse name: Not on file  ? Number of children: Not on file  ? Years of education: Not on file  ? Highest education level: Not on file  ?Occupational History  ? Not on file  ?Tobacco Use  ? Smoking status: Every Day  ?  Packs/day: 0.25  ?  Years: 35.00  ?  Pack years: 8.75  ?  Types: Cigarettes  ? Smokeless tobacco: Never  ? Tobacco comments:  ?  6 cigarettes per day   ?Vaping Use  ? Vaping Use: Never used  ?Substance and Sexual Activity  ? Alcohol use: No  ?  Alcohol/week: 0.0 standard drinks  ? Drug use: No  ? Sexual activity: Not Currently  ?Other Topics Concern  ? Not on file  ?Social History Narrative  ? Marital status/children/pets: Married, 1  child.   ? Education/employment: College educated, retired.  ? Safety:   ?   -smoke alarm in the home:Yes  ?   - wears seatbelt: Yes  ?   - Feels safe in their relationships: Yes  ? ?Social Determinants of Health  ? ?Financial Resource Strain: Low Risk   ? Difficulty of Paying Living Expenses: Not hard at all  ?Food Insecurity: No Food Insecurity  ? Worried About Programme researcher, broadcasting/film/video in the Last Year: Never true  ? Ran Out of Food in the Last  Year: Never true  ?Transportation Needs: No Transportation Needs  ? Lack of Transportation (Medical): No  ? Lack of Transportation (Non-Medical): No  ?Physical Activity: Sufficiently Active  ? Days of Exercise per Week: 6 days  ? Minutes of Exercise per Session: 60 min  ?Stress: No Stress Concern Present  ? Feeling of Stress : Not at all  ?Social Connections: Moderately Isolated  ? Frequency of Communication with Friends and Family: More than three times a week  ? Frequency of Social Gatherings with Friends and Family: More than three times a week  ? Attends Religious Services: Never  ? Active Member of Clubs or Organizations: No  ? Attends Banker Meetings: Never  ? Marital Status: Married  ? ? ?Tobacco Counseling ?Ready to quit: Not Answered ?Counseling given: Not Answered ?Tobacco comments: 6 cigarettes per day  ? ? ?Clinical Intake: ? ?Pre-visit preparation completed: Yes ? ?Pain : No/denies pain ? ?  ? ?BMI - recorded: 34.67 ?Nutritional Status: BMI > 30  Obese ?Nutritional Risks: None ?Diabetes: Yes ?CBG done?: Yes (125 stated) ?CBG resulted in Enter/ Edit results?: No ?Did pt. bring in CBG monitor from home?: No ? ?How often do you need to have someone help you when you read instructions, pamphlets, or other written materials from your doctor or pharmacy?: 1 - Never ? ?Diabetic?Nutrition Risk Assessment: ? ?Has the patient had any N/V/D within the last 2 months?  No  ?Does the patient have any non-healing wounds?  No  ?Has the patient had any unintentional  weight loss or weight gain?  No  ? ?Diabetes: ? ?Is the patient diabetic?  Yes  ?If diabetic, was a CBG obtained today?  Yes  ?Did the patient bring in their glucometer from home?  No  ?How often do you mo

## 2021-07-30 NOTE — Patient Instructions (Signed)
Ms. Bergevin , ?Thank you for taking time to come for your Medicare Wellness Visit. I appreciate your ongoing commitment to your health goals. Please review the following plan we discussed and let me know if I can assist you in the future.  ? ?Screening recommendations/referrals: ?Colonoscopy: Done 03/27/19 repeat every 10 years  ?Mammogram: Done 11/22/20 repeat every year  ?Bone Density: Done 12/14/17 repeat every 2 years  ?Recommended yearly ophthalmology/optometry visit for glaucoma screening and checkup ?Recommended yearly dental visit for hygiene and checkup ? ?Vaccinations: ?Influenza vaccine: Done 01/30/21 repeat every year  ?Pneumococcal vaccine: Up to date ?Tdap vaccine: Done 03/05/15 repeat every 10 years  ?Shingles vaccine: Completed 6/11 & 02/07/18   ?Covid-19:Completed 2/4, 3/1, 01/04/20 & 10/09/20 & 03/24/21 ? ?Advanced directives: Copies in chart  ? ?Conditions/risks identified: None at this time  ? ?Next appointment: Follow up in one year for your annual wellness visit  ? ? ?Preventive Care 85 Years and Older, Female ?Preventive care refers to lifestyle choices and visits with your health care provider that can promote health and wellness. ?What does preventive care include? ?A yearly physical exam. This is also called an annual well check. ?Dental exams once or twice a year. ?Routine eye exams. Ask your health care provider how often you should have your eyes checked. ?Personal lifestyle choices, including: ?Daily care of your teeth and gums. ?Regular physical activity. ?Eating a healthy diet. ?Avoiding tobacco and drug use. ?Limiting alcohol use. ?Practicing safe sex. ?Taking low-dose aspirin every day. ?Taking vitamin and mineral supplements as recommended by your health care provider. ?What happens during an annual well check? ?The services and screenings done by your health care provider during your annual well check will depend on your age, overall health, lifestyle risk factors, and family history of  disease. ?Counseling  ?Your health care provider may ask you questions about your: ?Alcohol use. ?Tobacco use. ?Drug use. ?Emotional well-being. ?Home and relationship well-being. ?Sexual activity. ?Eating habits. ?History of falls. ?Memory and ability to understand (cognition). ?Work and work Astronomer. ?Reproductive health. ?Screening  ?You may have the following tests or measurements: ?Height, weight, and BMI. ?Blood pressure. ?Lipid and cholesterol levels. These may be checked every 5 years, or more frequently if you are over 23 years old. ?Skin check. ?Lung cancer screening. You may have this screening every year starting at age 23 if you have a 30-pack-year history of smoking and currently smoke or have quit within the past 15 years. ?Fecal occult blood test (FOBT) of the stool. You may have this test every year starting at age 5. ?Flexible sigmoidoscopy or colonoscopy. You may have a sigmoidoscopy every 5 years or a colonoscopy every 10 years starting at age 51. ?Hepatitis C blood test. ?Hepatitis B blood test. ?Sexually transmitted disease (STD) testing. ?Diabetes screening. This is done by checking your blood sugar (glucose) after you have not eaten for a while (fasting). You may have this done every 1-3 years. ?Bone density scan. This is done to screen for osteoporosis. You may have this done starting at age 20. ?Mammogram. This may be done every 1-2 years. Talk to your health care provider about how often you should have regular mammograms. ?Talk with your health care provider about your test results, treatment options, and if necessary, the need for more tests. ?Vaccines  ?Your health care provider may recommend certain vaccines, such as: ?Influenza vaccine. This is recommended every year. ?Tetanus, diphtheria, and acellular pertussis (Tdap, Td) vaccine. You may need a Td booster  every 10 years. ?Zoster vaccine. You may need this after age 86. ?Pneumococcal 13-valent conjugate (PCV13) vaccine. One  dose is recommended after age 8. ?Pneumococcal polysaccharide (PPSV23) vaccine. One dose is recommended after age 63. ?Talk to your health care provider about which screenings and vaccines you need and how often you need them. ?This information is not intended to replace advice given to you by your health care provider. Make sure you discuss any questions you have with your health care provider. ?Document Released: 05/17/2015 Document Revised: 01/08/2016 Document Reviewed: 02/19/2015 ?Elsevier Interactive Patient Education ? 2017 [place]. ? ?Fall Prevention in the Home ?Falls can cause injuries. They can happen to people of all ages. There are many things you can do to make your home safe and to help prevent falls. ?What can I do on the outside of my home? ?Regularly fix the edges of walkways and driveways and fix any cracks. ?Remove anything that might make you trip as you walk through a door, such as a raised step or threshold. ?Trim any bushes or trees on the path to your home. ?Use bright outdoor lighting. ?Clear any walking paths of anything that might make someone trip, such as rocks or tools. ?Regularly check to see if handrails are loose or broken. Make sure that both sides of any steps have handrails. ?Any raised decks and porches should have guardrails on the edges. ?Have any leaves, snow, or ice cleared regularly. ?Use sand or salt on walking paths during winter. ?Clean up any spills in your garage right away. This includes oil or grease spills. ?What can I do in the bathroom? ?Use night lights. ?Install grab bars by the toilet and in the tub and shower. Do not use towel bars as grab bars. ?Use non-skid mats or decals in the tub or shower. ?If you need to sit down in the shower, use a plastic, non-slip stool. ?Keep the floor dry. Clean up any water that spills on the floor as soon as it happens. ?Remove soap buildup in the tub or shower regularly. ?Attach bath mats securely with double-sided  non-slip rug tape. ?Do not have throw rugs and other things on the floor that can make you trip. ?What can I do in the bedroom? ?Use night lights. ?Make sure that you have a light by your bed that is easy to reach. ?Do not use any sheets or blankets that are too big for your bed. They should not hang down onto the floor. ?Have a firm chair that has side arms. You can use this for support while you get dressed. ?Do not have throw rugs and other things on the floor that can make you trip. ?What can I do in the kitchen? ?Clean up any spills right away. ?Avoid walking on wet floors. ?Keep items that you use a lot in easy-to-reach places. ?If you need to reach something above you, use a strong step stool that has a grab bar. ?Keep electrical cords out of the way. ?Do not use floor polish or wax that makes floors slippery. If you must use wax, use non-skid floor wax. ?Do not have throw rugs and other things on the floor that can make you trip. ?What can I do with my stairs? ?Do not leave any items on the stairs. ?Make sure that there are handrails on both sides of the stairs and use them. Fix handrails that are broken or loose. Make sure that handrails are as long as the stairways. ?Check any carpeting to  make sure that it is firmly attached to the stairs. Fix any carpet that is loose or worn. ?Avoid having throw rugs at the top or bottom of the stairs. If you do have throw rugs, attach them to the floor with carpet tape. ?Make sure that you have a light switch at the top of the stairs and the bottom of the stairs. If you do not have them, ask someone to add them for you. ?What else can I do to help prevent falls? ?Wear shoes that: ?Do not have high heels. ?Have rubber bottoms. ?Are comfortable and fit you well. ?Are closed at the toe. Do not wear sandals. ?If you use a stepladder: ?Make sure that it is fully opened. Do not climb a closed stepladder. ?Make sure that both sides of the stepladder are locked into place. ?Ask  someone to hold it for you, if possible. ?Clearly mark and make sure that you can see: ?Any grab bars or handrails. ?First and last steps. ?Where the edge of each step is. ?Use tools that help you move around (mo

## 2021-08-05 ENCOUNTER — Other Ambulatory Visit: Payer: Self-pay | Admitting: Physician Assistant

## 2021-08-05 NOTE — Telephone Encounter (Signed)
Next Visit: 09/17/2021 ?  ?Last Visit: 04/17/2021 ?  ?Last Fill: 03/03/2021 ?  ?Dx: Rheumatoid arthritis of multiple sites with negative rheumatoid factor ?  ?Current Dose per office note on 04/17/2021: Methotrexate 0.7 mL sq injections once weekly  ?  ?Labs: 04/17/2021 CBC and CMP WNL ? ?Patient advised she is due to update lab. Patient states she will update labs this week.  ?  ?Okay to refill MTX?  ?

## 2021-08-07 ENCOUNTER — Other Ambulatory Visit: Payer: Self-pay | Admitting: *Deleted

## 2021-08-07 DIAGNOSIS — Z79899 Other long term (current) drug therapy: Secondary | ICD-10-CM | POA: Diagnosis not present

## 2021-08-07 DIAGNOSIS — M0609 Rheumatoid arthritis without rheumatoid factor, multiple sites: Secondary | ICD-10-CM | POA: Diagnosis not present

## 2021-08-08 LAB — COMPLETE METABOLIC PANEL WITH GFR
AG Ratio: 1.4 (calc) (ref 1.0–2.5)
ALT: 12 U/L (ref 6–29)
AST: 21 U/L (ref 10–35)
Albumin: 4.3 g/dL (ref 3.6–5.1)
Alkaline phosphatase (APISO): 33 U/L — ABNORMAL LOW (ref 37–153)
BUN: 19 mg/dL (ref 7–25)
CO2: 26 mmol/L (ref 20–32)
Calcium: 9.4 mg/dL (ref 8.6–10.4)
Chloride: 105 mmol/L (ref 98–110)
Creat: 0.84 mg/dL (ref 0.60–1.00)
Globulin: 3 g/dL (calc) (ref 1.9–3.7)
Glucose, Bld: 103 mg/dL — ABNORMAL HIGH (ref 65–99)
Potassium: 4.4 mmol/L (ref 3.5–5.3)
Sodium: 139 mmol/L (ref 135–146)
Total Bilirubin: 0.5 mg/dL (ref 0.2–1.2)
Total Protein: 7.3 g/dL (ref 6.1–8.1)
eGFR: 73 mL/min/{1.73_m2} (ref >=60)

## 2021-08-08 LAB — CBC WITH DIFFERENTIAL/PLATELET
Absolute Monocytes: 440 cells/uL (ref 200–950)
Basophils Absolute: 37 cells/uL (ref 0–200)
Basophils Relative: 0.6 %
Eosinophils Absolute: 130 cells/uL (ref 15–500)
Eosinophils Relative: 2.1 %
HCT: 41 % (ref 35.0–45.0)
Hemoglobin: 13.4 g/dL (ref 11.7–15.5)
Lymphs Abs: 1835 cells/uL (ref 850–3900)
MCH: 29.6 pg (ref 27.0–33.0)
MCHC: 32.7 g/dL (ref 32.0–36.0)
MCV: 90.5 fL (ref 80.0–100.0)
MPV: 9.8 fL (ref 7.5–12.5)
Monocytes Relative: 7.1 %
Neutro Abs: 3757 cells/uL (ref 1500–7800)
Neutrophils Relative %: 60.6 %
Platelets: 300 10*3/uL (ref 140–400)
RBC: 4.53 10*6/uL (ref 3.80–5.10)
RDW: 14.2 % (ref 11.0–15.0)
Total Lymphocyte: 29.6 %
WBC: 6.2 10*3/uL (ref 3.8–10.8)

## 2021-08-10 NOTE — Progress Notes (Signed)
CBC and CMP are normal.

## 2021-08-25 DIAGNOSIS — H25813 Combined forms of age-related cataract, bilateral: Secondary | ICD-10-CM | POA: Diagnosis not present

## 2021-08-25 DIAGNOSIS — H47013 Ischemic optic neuropathy, bilateral: Secondary | ICD-10-CM | POA: Diagnosis not present

## 2021-08-25 DIAGNOSIS — E119 Type 2 diabetes mellitus without complications: Secondary | ICD-10-CM | POA: Diagnosis not present

## 2021-08-25 LAB — HM DIABETES EYE EXAM

## 2021-09-03 NOTE — Progress Notes (Signed)
? ?Office Visit Note ? ?Patient: [name]             ?Date of Birth: Feb 15, 1946           ?MRN: [id number]             ?PCP: Felix Pacini A, DO ?Referring: Felix Pacini A, DO ?Visit Date: 09/17/2021 ?Occupation: @GUAROCC @ ? ?Subjective:  ?Fatigue ? ?History of Present Illness: [name] is a 76 y.o. female with history of seronegative rheumatoid arthritis and osteoarthritis.  She is currently on Methotrexate 0.7 mL sq injections once weekly and folic acid 2 mg by mouth daily.  She has not had to miss any doses of methotrexate recently.  She denies any signs or symptoms of a rheumatoid arthritis flare recently.  She is not experiencing any joint swelling at this time.  She continues to have some morning stiffness lasting about 1 hour.  She has not had any difficulty with ADLs.  She states that overall her knee replacements are doing well.  She continues to have chronic fatigue on a daily basis.  ?She denies any recent infections. ? ? ?Activities of Daily Living:  ?Patient reports morning stiffness for 1 hour.   ?Patient Denies nocturnal pain.  ?Difficulty dressing/grooming: Denies ?Difficulty climbing stairs: Denies ?Difficulty getting out of chair: Denies ?Difficulty using hands for taps, buttons, cutlery, and/or writing: Reports ? ?Review of Systems  ?Constitutional:  Positive for fatigue.  ?HENT:  Positive for mouth dryness. Negative for mouth sores and nose dryness.   ?Eyes:  Negative for pain, visual disturbance and dryness.  ?Respiratory:  Negative for cough, hemoptysis, shortness of breath and difficulty breathing.   ?Cardiovascular:  Negative for chest pain, palpitations, hypertension and swelling in legs/feet.  ?Gastrointestinal:  Negative for blood in stool, constipation and diarrhea.  ?Endocrine: Negative for excessive thirst and increased urination.  ?Genitourinary:  Negative for difficulty urinating and painful urination.  ?Musculoskeletal:  Positive for joint pain, joint pain, joint  swelling and morning stiffness. Negative for myalgias, muscle weakness, muscle tenderness and myalgias.  ?Skin:  Negative for color change, pallor, rash, hair loss, nodules/bumps, skin tightness, ulcers and sensitivity to sunlight.  ?Allergic/Immunologic: Negative for susceptible to infections.  ?Neurological:  Positive for numbness and weakness. Negative for dizziness and headaches.  ?Hematological:  Negative for bruising/bleeding tendency and swollen glands.  ?Psychiatric/Behavioral:  Positive for sleep disturbance. Negative for depressed mood. The patient is not nervous/anxious.   ? ?PMFS History:  ?Patient Active Problem List  ? Diagnosis Date Noted  ? BMI 34.0-34.9,adult 05/13/2021  ? Morbid obesity (HCC) 05/16/2020  ? Schamberg's purpura 10/26/2019  ? Sleep disturbance 10/13/2019  ? Vitamin D insufficiency 10/10/2019  ? 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 Hypermetropia of both eyes 04/22/2018  ? Hyperopia with presbyopia 04/22/2018  ? Osteopenia 02/14/2016  ? Cataract associated with type 2 diabetes mellitus (HCC) 02/14/2016  ? Hypertension associated with diabetes (HCC) 02/16/2012  ? Occasional cigarette smoker 04/07/2010  ? Hyperlipidemia associated with type 2 diabetes mellitus (HCC) 03/18/2010  ?  ?Past Medical History:  ?Diagnosis Date  ? Allergy   ? Arthritis   ? Chicken pox   ? Colon polyp   ? COPD (chronic obstructive pulmonary disease) (HCC)   ? CVA (cerebral infarction)   ? Diabetes mellitus without complication (HCC)   ? 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 Diverticulitis   ?  Family history of colonic polyps 05/16/2020  ? History of blood transfusion   ? Hyperlipidemia   ? Insomnia   ? Internal hemorrhoids 05/16/2020  ? Nasal polyps   ? Obesity   ? PONV (postoperative nausea and vomiting)   ? Radial styloid tenosynovitis of left hand 12/21/2019  ? Rectal bleeding 05/16/2020  ?  Sinusitis, chronic 03/18/2010  ? Qualifier: Diagnosis of  By: Ninetta LightsHatcher MD, Tinnie GensJeffrey    ?  ?Family History  ?Problem Relation Age of Onset  ? Arthritis Mother   ? Diabetes Mother   ?     Type I  ? Aneurysm Father   ? Stroke Father   ? Arthritis Sister   ? Arthritis Sister   ? Down syndrome Son   ? Diabetes Son   ?     type I  ? ?Past Surgical History:  ?Procedure Laterality Date  ? ABDOMINAL HYSTERECTOMY  2000  ? APPENDECTOMY    ? CARDIAC CATHETERIZATION N/A 03/27/2015  ? Procedure: Left Heart Cath and Coronary Angiography;  Surgeon: Yates DecampJay Ganji, MD;  [facility];  Service: Cardiovascular;  Laterality: N/A;  ? CESAREAN SECTION    ? ELBOW SURGERY Right   ? FIXATION KYPHOPLASTY LUMBAR SPINE  10/16/2020  ? L2 and L3 kyphoplasty  ? KNEE ARTHROPLASTY    ? KYPHOPLASTY N/A 10/16/2020  ? Procedure: LUMBAR 2 AND LUMBAR 3 KYPHOPLASTY;  Surgeon: Estill Bambergumonski, Mark, MD;  Location: [facility];  Service: Orthopedics;  Laterality: N/A;  ? NASAL SINUS SURGERY    ? REPLACEMENT TOTAL KNEE Bilateral 2007 2009  ? TONSILLECTOMY AND ADENOIDECTOMY    ? WISDOM TOOTH EXTRACTION    ? ?Social History  ? ?Social History Narrative  ? Marital status/children/pets: Married, 1 child.   ? Education/employment: College educated, retired.  ? Safety:   ?   -smoke alarm in the home:Yes  ?   - wears seatbelt: Yes  ?   - Feels safe in their relationships: Yes  ? ?Immunization History  ?Administered Date(s) Administered  ? Fluad Quad(high Dose 65+) 01/11/2019, 02/15/2020  ? Influenza Whole 04/21/2010  ? Influenza, High Dose Seasonal PF 02/20/2013, 02/07/2018, 01/30/2021  ? Influenza,inj,Quad PF,6+ Mos 02/23/2017  ? Influenza-Unspecified 02/15/2014, 01/30/2016, 02/07/2018, 01/11/2019  ? PFIZER Comirnaty(Gray Top)Covid-19 Tri-Sucrose Vaccine 10/09/2020  ? PFIZER(Purple Top)SARS-COV-2 Vaccination 06/08/2019, 07/03/2019, 01/04/2020  ? PNEUMOCOCCAL CONJUGATE-20 05/13/2021  ? Research officer, trade unionfizer Covid-19 Vaccine Bivalent Booster 1954yrs & up 03/24/2021  ? Pneumococcal  Conjugate-13 02/20/2013  ? Pneumococcal Polysaccharide-23 04/05/2006  ? Pneumococcal-Unspecified 03/05/2015  ? Tdap 09/14/2001, 03/05/2015  ? Tetanus 03/27/2014  ? Zoster Recombinat (Shingrix) 10/12/2017, 02/07/2018  ? Zoster, Live 12/07/2013  ?  ? ?Objective: ?Vital Signs: BP 110/70 (BP Location: Left Arm, Patient Position: Sitting, Cuff Size: Normal)   Pulse 82   Resp 16   Ht 5\' 4"  (1.626 m)   Wt 200 lb (90.7 kg)   BMI 34.33 kg/m?   ? ?Physical Exam ?Vitals and nursing note reviewed.  ?Constitutional:   ?   Appearance: She is well-developed.  ?HENT:  ?   Head: Normocephalic and atraumatic.  ?Eyes:  ?   Conjunctiva/sclera: Conjunctivae normal.  ?Cardiovascular:  ?   Rate and Rhythm: Normal rate and regular rhythm.  ?   Heart sounds: Normal heart sounds.  ?Pulmonary:  ?   Effort: Pulmonary effort is normal.  ?   Breath sounds: Normal breath sounds.  ?Abdominal:  ?   General: Bowel sounds are normal.  ?   Palpations: Abdomen is soft.  ?  Musculoskeletal:  ?   Cervical back: Normal range of motion.  ?Skin: ?   General: Skin is warm and dry.  ?   Capillary Refill: Capillary refill takes less than 2 seconds.  ?Neurological:  ?   Mental Status: She is alert and oriented to person, place, and time.  ?Psychiatric:     ?   Behavior: Behavior normal.  ?  ? ?Musculoskeletal Exam: C-spine, thoracic spine, lumbar spine have good range of motion.  Shoulder joints, elbow joints, wrist joints, MCPs, PIPs, DIPs have good range of motion with no synovitis.  Complete fist formation bilaterally.  Thickening of the left middle flexor tendon noted.  PIP and DIP thickening consistent with osteoarthritis of both hands.  Hip joints have good range of motion with no groin pain.  Bilateral knee replacements have good range of motion with no warmth or effusion.  Ankle joints have some synovial thickening and pedal edema but no tenderness or synovitis noted. ? ?CDAI Exam: ?CDAI Score: 0.4  ?Patient Global: 2 mm; Provider Global: 2  mm ?Swollen: 0 ; Tender: 0  ?Joint Exam 09/17/2021  ? ?No joint exam has been documented for this visit  ? ?There is currently no information documented on the homunculus. Go to the Rheumatology activity and complete the homuncul

## 2021-09-17 ENCOUNTER — Ambulatory Visit: Payer: PPO | Admitting: Physician Assistant

## 2021-09-17 ENCOUNTER — Encounter: Payer: Self-pay | Admitting: Physician Assistant

## 2021-09-17 VITALS — BP 110/70 | HR 82 | Resp 16 | Ht 64.0 in | Wt 200.0 lb

## 2021-09-17 DIAGNOSIS — Z79899 Other long term (current) drug therapy: Secondary | ICD-10-CM | POA: Diagnosis not present

## 2021-09-17 DIAGNOSIS — E785 Hyperlipidemia, unspecified: Secondary | ICD-10-CM

## 2021-09-17 DIAGNOSIS — M19072 Primary osteoarthritis, left ankle and foot: Secondary | ICD-10-CM

## 2021-09-17 DIAGNOSIS — E559 Vitamin D deficiency, unspecified: Secondary | ICD-10-CM

## 2021-09-17 DIAGNOSIS — E1159 Type 2 diabetes mellitus with other circulatory complications: Secondary | ICD-10-CM | POA: Diagnosis not present

## 2021-09-17 DIAGNOSIS — M65332 Trigger finger, left middle finger: Secondary | ICD-10-CM

## 2021-09-17 DIAGNOSIS — R5383 Other fatigue: Secondary | ICD-10-CM

## 2021-09-17 DIAGNOSIS — M19042 Primary osteoarthritis, left hand: Secondary | ICD-10-CM

## 2021-09-17 DIAGNOSIS — Z8709 Personal history of other diseases of the respiratory system: Secondary | ICD-10-CM

## 2021-09-17 DIAGNOSIS — M0609 Rheumatoid arthritis without rheumatoid factor, multiple sites: Secondary | ICD-10-CM

## 2021-09-17 DIAGNOSIS — H47013 Ischemic optic neuropathy, bilateral: Secondary | ICD-10-CM

## 2021-09-17 DIAGNOSIS — Z96653 Presence of artificial knee joint, bilateral: Secondary | ICD-10-CM | POA: Diagnosis not present

## 2021-09-17 DIAGNOSIS — E1136 Type 2 diabetes mellitus with diabetic cataract: Secondary | ICD-10-CM

## 2021-09-17 DIAGNOSIS — M8589 Other specified disorders of bone density and structure, multiple sites: Secondary | ICD-10-CM

## 2021-09-17 DIAGNOSIS — Z789 Other specified health status: Secondary | ICD-10-CM | POA: Diagnosis not present

## 2021-09-17 DIAGNOSIS — T466X5A Adverse effect of antihyperlipidemic and antiarteriosclerotic drugs, initial encounter: Secondary | ICD-10-CM

## 2021-09-17 DIAGNOSIS — E118 Type 2 diabetes mellitus with unspecified complications: Secondary | ICD-10-CM

## 2021-09-17 DIAGNOSIS — M791 Myalgia, unspecified site: Secondary | ICD-10-CM

## 2021-09-17 DIAGNOSIS — E1169 Type 2 diabetes mellitus with other specified complication: Secondary | ICD-10-CM

## 2021-09-17 DIAGNOSIS — M19071 Primary osteoarthritis, right ankle and foot: Secondary | ICD-10-CM

## 2021-09-17 DIAGNOSIS — M19041 Primary osteoarthritis, right hand: Secondary | ICD-10-CM

## 2021-09-17 DIAGNOSIS — Z720 Tobacco use: Secondary | ICD-10-CM

## 2021-09-17 DIAGNOSIS — I152 Hypertension secondary to endocrine disorders: Secondary | ICD-10-CM

## 2021-09-17 NOTE — Patient Instructions (Signed)
Standing Labs ?We placed an order today for your standing lab work.  ? ?Please have your standing labs drawn in July and every 3 months  ? ?If possible, please have your labs drawn 2 weeks prior to your appointment so that the provider can discuss your results at your appointment. ? ?Please note that you may see your imaging and lab results in MyChart before we have reviewed them. ?We may be awaiting multiple results to interpret others before contacting you. ?Please allow our office up to 72 hours to thoroughly review all of the results before contacting the office for clarification of your results. ? ?We have open lab daily: ?Monday through Thursday from 1:30-4:30 PM and Friday from 1:30-4:00 PM ?at the office of Dr. Shaili Deveshwar, [facility].   ?Please be advised, all patients with office appointments requiring lab work will take precedent over walk-in lab work.  ?If possible, please come for your lab work on Monday and Friday afternoons, as you may experience shorter wait times. ?The office is located at [address], [address], [place],  [address] ?No appointment is necessary.   ?Labs are drawn by [facility]. Please bring your co-pay at the time of your lab draw.  You may receive a bill from [facility] for your lab work. ? ?Please note if you are on Hydroxychloroquine and and an order has been placed for a Hydroxychloroquine level, you will need to have it drawn 4 hours or more after your last dose. ? ?If you wish to have your labs drawn at another location, please call the office 24 hours in advance to send orders. ? ?If you have any questions regarding directions or hours of operation,  ?please call [phone].   ?As a reminder, please drink plenty of water prior to coming for your lab work. Thanks! ? ?

## 2021-09-30 DIAGNOSIS — M65332 Trigger finger, left middle finger: Secondary | ICD-10-CM | POA: Diagnosis not present

## 2021-10-28 DIAGNOSIS — M65332 Trigger finger, left middle finger: Secondary | ICD-10-CM | POA: Diagnosis not present

## 2021-10-29 ENCOUNTER — Other Ambulatory Visit: Payer: Self-pay | Admitting: Physician Assistant

## 2021-10-29 ENCOUNTER — Encounter: Payer: Self-pay | Admitting: Family Medicine

## 2021-10-29 ENCOUNTER — Ambulatory Visit (INDEPENDENT_AMBULATORY_CARE_PROVIDER_SITE_OTHER): Payer: PPO | Admitting: Family Medicine

## 2021-10-29 VITALS — BP 112/70 | HR 75 | Temp 97.4°F | Ht 63.98 in | Wt 199.8 lb

## 2021-10-29 DIAGNOSIS — M8589 Other specified disorders of bone density and structure, multiple sites: Secondary | ICD-10-CM | POA: Diagnosis not present

## 2021-10-29 DIAGNOSIS — I10 Essential (primary) hypertension: Secondary | ICD-10-CM

## 2021-10-29 DIAGNOSIS — Z0000 Encounter for general adult medical examination without abnormal findings: Secondary | ICD-10-CM | POA: Diagnosis not present

## 2021-10-29 DIAGNOSIS — Z6834 Body mass index (BMI) 34.0-34.9, adult: Secondary | ICD-10-CM | POA: Diagnosis not present

## 2021-10-29 DIAGNOSIS — E559 Vitamin D deficiency, unspecified: Secondary | ICD-10-CM | POA: Diagnosis not present

## 2021-10-29 DIAGNOSIS — M0609 Rheumatoid arthritis without rheumatoid factor, multiple sites: Secondary | ICD-10-CM

## 2021-10-29 DIAGNOSIS — E1169 Type 2 diabetes mellitus with other specified complication: Secondary | ICD-10-CM | POA: Diagnosis not present

## 2021-10-29 DIAGNOSIS — G479 Sleep disorder, unspecified: Secondary | ICD-10-CM | POA: Diagnosis not present

## 2021-10-29 DIAGNOSIS — E785 Hyperlipidemia, unspecified: Secondary | ICD-10-CM

## 2021-10-29 DIAGNOSIS — Z1231 Encounter for screening mammogram for malignant neoplasm of breast: Secondary | ICD-10-CM

## 2021-10-29 LAB — TSH: TSH: 1.36 u[IU]/mL (ref 0.35–5.50)

## 2021-10-29 LAB — COMPREHENSIVE METABOLIC PANEL
ALT: 17 U/L (ref 0–35)
AST: 23 U/L (ref 0–37)
Albumin: 4.3 g/dL (ref 3.5–5.2)
Alkaline Phosphatase: 37 U/L — ABNORMAL LOW (ref 39–117)
BUN: 22 mg/dL (ref 6–23)
CO2: 26 mEq/L (ref 19–32)
Calcium: 9.3 mg/dL (ref 8.4–10.5)
Chloride: 102 mEq/L (ref 96–112)
Creatinine, Ser: 0.85 mg/dL (ref 0.40–1.20)
GFR: 67.81 mL/min (ref >60.00)
Glucose, Bld: 129 mg/dL — ABNORMAL HIGH (ref 70–99)
Potassium: 4.3 mEq/L (ref 3.5–5.1)
Sodium: 136 mEq/L (ref 135–145)
Total Bilirubin: 0.6 mg/dL (ref 0.2–1.2)
Total Protein: 7.6 g/dL (ref 6.0–8.3)

## 2021-10-29 LAB — VITAMIN D 25 HYDROXY (VIT D DEFICIENCY, FRACTURES): VITD: 48.59 ng/mL (ref 30.00–100.00)

## 2021-10-29 LAB — CBC
HCT: 42.2 % (ref 36.0–46.0)
Hemoglobin: 13.8 g/dL (ref 12.0–15.0)
MCHC: 32.6 g/dL (ref 30.0–36.0)
MCV: 92.5 fl (ref 78.0–100.0)
Platelets: 302 10*3/uL (ref 150.0–400.0)
RBC: 4.56 Mil/uL (ref 3.87–5.11)
RDW: 15.5 % (ref 11.5–15.5)
WBC: 4.3 10*3/uL (ref 4.0–10.5)

## 2021-10-29 LAB — LIPID PANEL
Cholesterol: 174 mg/dL (ref 0–200)
HDL: 46.6 mg/dL (ref >39.00)
LDL Cholesterol: 94 mg/dL (ref 0–99)
NonHDL: 127.25
Total CHOL/HDL Ratio: 4
Triglycerides: 166 mg/dL — ABNORMAL HIGH (ref 0.0–149.0)
VLDL: 33.2 mg/dL (ref 0.0–40.0)

## 2021-10-29 LAB — MICROALBUMIN / CREATININE URINE RATIO
Creatinine,U: 97.1 mg/dL
Microalb Creat Ratio: 0.7 mg/g (ref 0.0–30.0)
Microalb, Ur: <0.7 mg/dL (ref 0.0–1.9)

## 2021-10-29 LAB — HEMOGLOBIN A1C: Hgb A1c MFr Bld: 6.5 % (ref 4.6–6.5)

## 2021-10-29 MED ORDER — SITAGLIPTIN PHOSPHATE 50 MG PO TABS: 50.0000 mg | ORAL_TABLET | Freq: Every day | ORAL | 1 refills | Status: DC

## 2021-10-29 MED ORDER — TRIAMCINOLONE ACETONIDE 0.1 % EX CREA: 1.0000 | TOPICAL_CREAM | Freq: Two times a day (BID) | CUTANEOUS | 0 refills | Status: AC | PRN

## 2021-10-29 MED ORDER — METFORMIN HCL 500 MG PO TABS
500.0000 mg | ORAL_TABLET | Freq: Two times a day (BID) | ORAL | 1 refills | Status: DC
Start: 2021-10-29 — End: 2022-04-15

## 2021-10-29 MED ORDER — LOSARTAN POTASSIUM 25 MG PO TABS: 12.5000 mg | ORAL_TABLET | Freq: Every day | ORAL | 1 refills | Status: DC

## 2021-10-29 NOTE — Progress Notes (Signed)
Patient ID: [name], female  DOB: [birth year], 76 y.o.   MRN: [id number] Patient Care Team    Relationship Specialty Notifications Start End  Natalia Leatherwood, DO PCP - General Family Medicine  10/10/19   Pollyann Savoy, MD Consulting Physician Rheumatology  10/10/19   Charna Elizabeth, MD Consulting Physician Gastroenterology  10/10/19   Renae Fickle, OD  Optometry  10/10/19     Chief Complaint  Patient presents with   Annual Exam    [name].    Subjective:  [name] is a 76 y.o.  female present for CPE/CMC All past medical history, surgical history, allergies, family history, immunizations, medications and social history were updated in the electronic medical record today. All recent labs, ED visits and hospitalizations within the last year were reviewed.  Health maintenance:  Colonoscopy: completed 03/27/2019, by Dr. Lolly Mustache further colon cancer screenings needed. Mammogram: completed: 97/2022, normal.  Breast center in [place].  > ordered Immunizations: tdap 03/2015, Influenza (encouraged yearly), PNA series completed, shingrix completed, Covid series completed Infectious disease screening: Hep C completed DEXA: last completed 12/2017- normal> ordered Assistive device: None Assistive device: none Oxygen TJQ:ZESP Patient has a Dental home. Hospitalizations/ED visits:reviewed  Hypertension associated with diabetes (HCC)/obesity/statin intolerance/hyperlipidemia Pt reports compliance with losartan 12.5 mg daily, pravastatin and fenofibrate. Blood pressures ranges at home not routinely checked. Patient denies chest pain, shortness of breath, dizziness or lower extremity edema.  Pt takes a daily baby ASA.       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  Patient is prescribed methotrexate and folate.   Type 2 diabetes mellitus with unspecified complications (HCC)/cataracts associated  with type 2 diabetes Pt reports compliance with metformin 500 mg twice daily and Januvia.  She reports she was diagnosed with diabetes when she was around 52.  Patient denies dizziness, hyperglycemic or hypoglycemic events. Patient denies numbness, tingling in the extremities or nonhealing wounds of feet.    vitamin D deficiency Vitamin D levels are in normal range.  She is tolerating supplement 2000 un it daily.    Sleep disturbance Patient reports she is prescribed Ambien 5 mg nightly as needed.  She very rarely takes this medication.  When she does she takes the medication she takes one third of a tab.  States she has had the same bottle for over a year with 30 tabs in it.  Last refill 05/13/2021 - #90.     07/30/2021   11:07 AM 02/21/2020    8:22 AM 02/15/2020    9:54 AM 10/26/2019    8:57 AM 10/18/2018    9:51 AM  Depression screen PHQ 2/9  Decreased Interest 0 0 0 0   Down, Depressed, Hopeless 0 0 0 0 0  PHQ - 2 Score 0 0 0 0 0       No data to display                10/29/2021    8:54 AM 07/30/2021   11:09 AM 05/13/2021    9:24 AM 02/21/2020    8:21 AM 02/15/2020    9:54 AM  Fall Risk   Falls in the past year? 0 1 0 0 0  Number falls in past yr: 0 1 0 0 0  Injury with Fall? 0 1 0 0 0  Comment  back injury     Risk for fall due to :  Impaired vision No Fall Risks  Follow up Falls evaluation completed Falls prevention discussed Falls evaluation completed Falls prevention discussed Falls evaluation completed     Immunization History  Administered Date(s) Administered   Fluad Quad(high Dose 65+) 01/11/2019, 02/15/2020   Influenza Whole 04/21/2010   Influenza, High Dose Seasonal PF 02/20/2013, 02/07/2018, 01/30/2021   Influenza,inj,Quad PF,6+ Mos 02/23/2017   Influenza-Unspecified 02/15/2014, 01/30/2016, 02/07/2018, 01/11/2019   PFIZER Comirnaty(Gray Top)Covid-19 Tri-Sucrose Vaccine 10/09/2020   PFIZER(Purple Top)SARS-COV-2 Vaccination 06/08/2019, 07/03/2019,  01/04/2020   PNEUMOCOCCAL CONJUGATE-20 05/13/2021   Pfizer Covid-19 Vaccine Bivalent Booster 4344yrs & up 03/24/2021   Pneumococcal Conjugate-13 02/20/2013   Pneumococcal Polysaccharide-23 04/05/2006   Pneumococcal-Unspecified 03/05/2015   Tdap 09/14/2001, 03/05/2015   Tetanus 03/27/2014   Zoster Recombinat (Shingrix) 10/12/2017, 02/07/2018   Zoster, Live 12/07/2013    No results found.  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   Diverticulitis    Family history of colonic polyps 05/16/2020   History of blood transfusion    Hyperlipidemia    Insomnia    Internal hemorrhoids 05/16/2020   Nasal polyps    Obesity    PONV (postoperative nausea and vomiting)    Radial styloid tenosynovitis of left hand 12/21/2019   Rectal bleeding 05/16/2020   Sinusitis, chronic 03/18/2010   Qualifier: Diagnosis of  By: Ninetta LightsHatcher MD, Tinnie GensJeffrey     Allergies  Allergen Reactions   Lipitor [Atorvastatin Calcium]     Myalgia   Past Surgical History:  Procedure Laterality Date   ABDOMINAL HYSTERECTOMY  2000   APPENDECTOMY     CARDIAC CATHETERIZATION N/A 03/27/2015   Procedure: Left Heart Cath and Coronary Angiography;  Surgeon: Yates DecampJay Ganji, MD;  [facility];  Service: Cardiovascular;  Laterality: N/A;   CESAREAN SECTION     ELBOW SURGERY Right    FIXATION KYPHOPLASTY LUMBAR SPINE  10/16/2020   L2 and L3 kyphoplasty   KNEE ARTHROPLASTY     KYPHOPLASTY N/A 10/16/2020   Procedure: LUMBAR 2 AND LUMBAR 3 KYPHOPLASTY;  Surgeon: Estill Bambergumonski, Mark, MD;  Location: [facility] OR;  Service: Orthopedics;  Laterality: N/A;   NASAL SINUS SURGERY     REPLACEMENT TOTAL KNEE Bilateral 2007 2009   TONSILLECTOMY AND ADENOIDECTOMY     WISDOM TOOTH EXTRACTION     Family History  Problem  Relation Age of Onset   Arthritis Mother    Diabetes Mother        Type I   Aneurysm Father    Stroke Father    Arthritis Sister    Arthritis Sister    Down syndrome Son    Diabetes Son        type I   Social History   Social History Narrative   Marital status/children/pets: Married, 1 child.    Education/employment: Automotive engineerCollege educated, retired.   Safety:      -smoke alarm in the home:Yes     - wears seatbelt: Yes     - Feels safe in their relationships: Yes    Allergies as of 10/29/2021       Reactions   Lipitor [atorvastatin Calcium]    Myalgia        Medication List        Accurate as of October 29, 2021  5:34  PM. If you have any questions, ask your nurse or doctor.          cholecalciferol 25 MCG (1000 UNIT) tablet Commonly known as: VITAMIN D3 Take 1,000 Units by mouth daily.   fenofibrate 160 MG tablet Take 1 tablet (160 mg total) by mouth daily.   FISH OIL PO Take by mouth daily.   folic acid 1 MG tablet Commonly known as: FOLVITE TAKE 2 TABLETS BY MOUTH EVERY DAY   losartan 25 MG tablet Commonly known as: COZAAR Take 0.5 tablets (12.5 mg total) by mouth daily.   metFORMIN 500 MG tablet Commonly known as: GLUCOPHAGE Take 1 tablet (500 mg total) by mouth 2 (two) times daily.   methotrexate (PF) 50 MG/2ML injection INJECT 0.7 MLS (17.5 MG TOTAL) INTO THE SKIN ONCE A WEEK. What changed: Another medication with the same name was removed. Continue taking this medication, and follow the directions you see here. Changed by: Felix Pacini, DO   pravastatin 20 MG tablet Commonly known as: PRAVACHOL Take 1 tablet (20 mg total) by mouth daily.   sitaGLIPtin 50 MG tablet Commonly known as: JANUVIA Take 1 tablet (50 mg total) by mouth daily.   triamcinolone cream 0.1 % Commonly known as: KENALOG Apply 1 Application topically 2 (two) times daily as needed for up to 14 days. Started by: Felix Pacini, DO   Tuberculin-Allergy Syringes 27G X 1/2" 1 ML  Misc Use 1 syringe once weekly to inject methotrexate.   zolpidem 5 MG tablet Commonly known as: AMBIEN Take 1 tablet (5 mg total) by mouth at bedtime as needed for sleep.        All past medical history, surgical history, allergies, family history, immunizations andmedications were updated in the EMR today and reviewed under the history and medication portions of their EMR.      MM 3D SCREEN BREAST BILATERAL Result Date: 11/26/2020 RECOMMENDATION: Screening mammogram in one year. (Code:SM-B-01Y) BI-RADS CATEGORY  1: Negative. Electronically Signed   By: Bary Richard M.D.   On: 11/26/2020 11:22    ROS 14 pt review of systems performed and negative (unless mentioned in an HPI)  Objective: BP 112/70   Pulse 75   Temp (!) 97.4 F (36.3 C)   Ht 5' 3.98" (1.625 m)   Wt 199 lb 12.8 oz (90.6 kg)   SpO2 95%   BMI 34.32 kg/m  Physical Exam Vitals and nursing note reviewed.  Constitutional:      General: She is not in acute distress.    Appearance: Normal appearance. She is not ill-appearing or toxic-appearing.  HENT:     Head: Normocephalic and atraumatic.     Right Ear: Tympanic membrane, ear canal and external ear normal. There is no impacted cerumen.     Left Ear: Tympanic membrane, ear canal and external ear normal. There is no impacted cerumen.     Nose: No congestion or rhinorrhea.     Mouth/Throat:     Mouth: Mucous membranes are moist.     Pharynx: Oropharynx is clear. No oropharyngeal exudate or posterior oropharyngeal erythema.  Eyes:     General: No scleral icterus.       Right eye: No discharge.        Left eye: No discharge.     Extraocular Movements: Extraocular movements intact.     Conjunctiva/sclera: Conjunctivae normal.     Pupils: Pupils are equal, round, and reactive to light.  Cardiovascular:     Rate and Rhythm: Normal rate and  regular rhythm.     Pulses: Normal pulses.     Heart sounds: Normal heart sounds. No murmur heard.    No friction rub. No  gallop.  Pulmonary:     Effort: Pulmonary effort is normal. No respiratory distress.     Breath sounds: Normal breath sounds. No stridor. No wheezing, rhonchi or rales.  Chest:     Chest wall: No tenderness.  Abdominal:     General: Abdomen is flat. Bowel sounds are normal. There is no distension.     Palpations: Abdomen is soft. There is no mass.     Tenderness: There is no abdominal tenderness. There is no right CVA tenderness, left CVA tenderness, guarding or rebound.     Hernia: No hernia is present.  Musculoskeletal:        General: No swelling, tenderness or deformity. Normal range of motion.     Cervical back: Normal range of motion and neck supple. No rigidity or tenderness.     Right lower leg: No edema.     Left lower leg: No edema.  Lymphadenopathy:     Cervical: No cervical adenopathy.  Skin:    General: Skin is warm and dry.     Coloration: Skin is not jaundiced or pale.     Findings: No bruising, erythema, lesion or rash.  Neurological:     General: No focal deficit present.     Mental Status: She is alert and oriented to person, place, and time. Mental status is at baseline.     Cranial Nerves: No cranial nerve deficit.     Sensory: No sensory deficit.     Motor: No weakness.     Coordination: Coordination normal.     Gait: Gait normal.     Deep Tendon Reflexes: Reflexes normal.  Psychiatric:        Mood and Affect: Mood normal.        Behavior: Behavior normal.        Thought Content: Thought content normal.        Judgment: Judgment normal.       Assessment/plan: TAMSYN OWUSU is a 76 y.o. female present for CPE/CMC Hypertension/Hyperlipidema/morbid obesity Stable Continue losartan 12.5 mg qd Continue pravastatin -Continue omega-3 and fenofibrate. -CBC, CMP, TSH and lipids collected today  Osteopenia of multiple sites/vitamin D deficiency Stable.  Continue vitamin D 2000 units daily> was collected today Bone density ordered today   diabetes  (HCC)/obesity/cataracts associated with diabetes diabetes associated with hyperlipidemia Stable Continue metformin 500 mg twice daily for now> had SE to higher dose.  Continue Januvia 50 mg daily Continue routine exercise and low-sodium diet. 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  She is managed closely by her ophthalmology team Dr. Antionette Char has APPT in April 2022- retinopathy present.  A1c:7.4>>7.6>6.5>6.4> 6.5 > A1c collected today - CBC - Comprehensive metabolic panel - Hemoglobin A1c - Lipid panel - TSH Follow-up in 5 months with other chronic conditions.   Rheumatoid arthritis of multiple sites with negative rheumatoid factor (HCC) Prescribed methotrexate and folate managed by rheumatology.Stable.    Sleep disturbance: Stable Continue Ambien half tab as needed-rarely uses medication. Patient reports she did not need medication refill today. [place] controlled substance database reviewed 10/29/21   Routine general medical examination at a health care facility Colonoscopy: completed 03/27/2019, by Dr. Lolly Mustache further colon cancer  screenings needed. Mammogram: completed: 97/2022, normal.  Breast center in [place].  > ordered Immunizations: tdap 03/2015, Influenza (encouraged yearly), PNA series completed, shingrix completed, Covid series completed Infectious disease screening: Hep C completed DEXA: last completed 12/2017- normal> ordered Patient was encouraged to exercise greater than 150 minutes a week. Patient was encouraged to choose a diet filled with fresh fruits and vegetables, and lean meats. AVS provided to patient today for education/recommendation on gender specific health and safety maintenance.  Return in about 24 weeks (around 04/15/2022) for Routine chronic condition  follow-up.  Orders Placed This Encounter  Procedures   MM 3D SCREEN BREAST BILATERAL   DG Bone Density   CBC   Comprehensive metabolic panel   Hemoglobin A1c   Lipid panel   TSH   VITAMIN D 25 Hydroxy (Vit-D Deficiency, Fractures)   Microalbumin / creatinine urine ratio   Meds ordered this encounter  Medications   triamcinolone cream (KENALOG) 0.1 %    Sig: Apply 1 Application topically 2 (two) times daily as needed for up to 14 days.    Dispense:  45 g    Refill:  0   losartan (COZAAR) 25 MG tablet    Sig: Take 0.5 tablets (12.5 mg total) by mouth daily.    Dispense:  45 tablet    Refill:  1   metFORMIN (GLUCOPHAGE) 500 MG tablet    Sig: Take 1 tablet (500 mg total) by mouth 2 (two) times daily.    Dispense:  180 tablet    Refill:  1   sitaGLIPtin (JANUVIA) 50 MG tablet    Sig: Take 1 tablet (50 mg total) by mouth daily.    Dispense:  90 tablet    Refill:  1   Referral Orders  No referral(s) requested today     Note is dictated utilizing voice recognition software. Although note has been proof read prior to signing, occasional typographical errors still can be missed. If any questions arise, please do not hesitate to call for verification.  Electronically signed by: Felix Pacini, DO [facility] [place]

## 2021-10-29 NOTE — Patient Instructions (Signed)
No follow-ups on file.        Great to see you today.  I have refilled the medication(s) we provide.   If labs were collected, we will inform you of lab results once received either by echart message or telephone call.   - echart message- for normal results that have been seen by the patient already.   - telephone call: abnormal results or if patient has not viewed results in their echart.  Health Maintenance, Female Adopting a healthy lifestyle and getting preventive care are important in promoting health and wellness. Ask your health care provider about: The right schedule for you to have regular tests and exams. Things you can do on your own to prevent diseases and keep yourself healthy. What should I know about diet, weight, and exercise? Eat a healthy diet  Eat a diet that includes plenty of vegetables, fruits, low-fat dairy products, and lean protein. Do not eat a lot of foods that are high in solid fats, added sugars, or sodium. Maintain a healthy weight Body mass index (BMI) is used to identify weight problems. It estimates body fat based on height and weight. Your health care provider can help determine your BMI and help you achieve or maintain a healthy weight. Get regular exercise Get regular exercise. This is one of the most important things you can do for your health. Most adults should: Exercise for at least 150 minutes each week. The exercise should increase your heart rate and make you sweat (moderate-intensity exercise). Do strengthening exercises at least twice a week. This is in addition to the moderate-intensity exercise. Spend less time sitting. Even light physical activity can be beneficial. Watch cholesterol and blood lipids Have your blood tested for lipids and cholesterol at 74 years of age, then have this test every 5 years. Have your cholesterol levels checked more often if: Your lipid or cholesterol levels are high. You are older than 74 years of  age. You are at high risk for heart disease. What should I know about cancer screening? Depending on your health history and family history, you may need to have cancer screening at various ages. This may include screening for: Breast cancer. Cervical cancer. Colorectal cancer. Skin cancer. Lung cancer. What should I know about heart disease, diabetes, and high blood pressure? Blood pressure and heart disease High blood pressure causes heart disease and increases the risk of stroke. This is more likely to develop in people who have high blood pressure readings or are overweight. Have your blood pressure checked: Every 3-5 years if you are 18-39 years of age. Every year if you are 40 years old or older. Diabetes Have regular diabetes screenings. This checks your fasting blood sugar level. Have the screening done: Once every three years after age 40 if you are at a normal weight and have a low risk for diabetes. More often and at a younger age if you are overweight or have a high risk for diabetes. What should I know about preventing infection? Hepatitis B If you have a higher risk for hepatitis B, you should be screened for this virus. Talk with your health care provider to find out if you are at risk for hepatitis B infection. Hepatitis C Testing is recommended for: Everyone born from 1945 through 1965. Anyone with known risk factors for hepatitis C. Sexually transmitted infections (STIs) Get screened for STIs, including gonorrhea and chlamydia, if: You are sexually active and are younger than 74 years of age. You are   older than 74 years of age and your health care provider tells you that you are at risk for this type of infection. Your sexual activity has changed since you were last screened, and you are at increased risk for chlamydia or gonorrhea. Ask your health care provider if you are at risk. Ask your health care provider about whether you are at high risk for HIV. Your health  care provider may recommend a prescription medicine to help prevent HIV infection. If you choose to take medicine to prevent HIV, you should first get tested for HIV. You should then be tested every 3 months for as long as you are taking the medicine. Pregnancy If you are about to stop having your period (premenopausal) and you may become pregnant, seek counseling before you get pregnant. Take 400 to 800 micrograms (mcg) of folic acid every day if you become pregnant. Ask for birth control (contraception) if you want to prevent pregnancy. Osteoporosis and menopause Osteoporosis is a disease in which the bones lose minerals and strength with aging. This can result in bone fractures. If you are 65 years old or older, or if you are at risk for osteoporosis and fractures, ask your health care provider if you should: Be screened for bone loss. Take a calcium or vitamin D supplement to lower your risk of fractures. Be given hormone replacement therapy (HRT) to treat symptoms of menopause. Follow these instructions at home: Alcohol use Do not drink alcohol if: Your health care provider tells you not to drink. You are pregnant, may be pregnant, or are planning to become pregnant. If you drink alcohol: Limit how much you have to: 0-1 drink a day. Know how much alcohol is in your drink. In the [place] one drink equals one 12 oz bottle of beer (355 mL), one 5 oz glass of wine (148 mL), or one 1 oz glass of hard liquor (44 mL). Lifestyle Do not use any products that contain nicotine or tobacco. These products include cigarettes, chewing tobacco, and vaping devices, such as e-cigarettes. If you need help quitting, ask your health care provider. Do not use street drugs. Do not share needles. Ask your health care provider for help if you need support or information about quitting drugs. General instructions Schedule regular health, dental, and eye exams. Stay current with your vaccines. Tell your health  care provider if: You often feel depressed. You have ever been abused or do not feel safe at home. Summary Adopting a healthy lifestyle and getting preventive care are important in promoting health and wellness. Follow your health care provider's instructions about healthy diet, exercising, and getting tested or screened for diseases. Follow your health care provider's instructions on monitoring your cholesterol and blood pressure. This information is not intended to replace advice given to you by your health care provider. Make sure you discuss any questions you have with your health care provider. Document Revised: 09/09/2020 Document Reviewed: 09/09/2020 Elsevier Patient Education  2023 [employer]vier Inc.  

## 2021-10-30 NOTE — Telephone Encounter (Signed)
Next Visit: 02/24/2022  Last Visit: 09/17/2021  Last Fill: 11/14/2020  Dx: Rheumatoid arthritis of multiple sites with negative rheumatoid factor   Okay to refill Syringes?

## 2021-11-24 ENCOUNTER — Other Ambulatory Visit: Payer: Self-pay | Admitting: Rheumatology

## 2021-11-24 ENCOUNTER — Other Ambulatory Visit: Payer: Self-pay | Admitting: Physician Assistant

## 2021-11-24 MED ORDER — METHOTREXATE SODIUM CHEMO INJECTION (PF) 50 MG/2ML: INTRAMUSCULAR | 2 refills | Status: DC

## 2021-11-24 NOTE — Telephone Encounter (Signed)
Next Visit: 02/24/2022  Last Visit: 09/17/2021  Last Fill: 03/03/2021  DX: Rheumatoid arthritis of multiple sites with negative rheumatoid factor   Current Dose per office note 09/17/2021: Methotrexate 0.7 mL sq injections once weekly   Labs: 10/29/2021 Glucose 129, Alk Phos 37,   Okay to refill MTX?

## 2021-11-24 NOTE — Telephone Encounter (Signed)
Patient called requesting prescription refill of Methotrexate to be sent to [facility] in [facility].

## 2021-12-01 DIAGNOSIS — M25511 Pain in right shoulder: Secondary | ICD-10-CM | POA: Diagnosis not present

## 2021-12-25 ENCOUNTER — Other Ambulatory Visit: Payer: Self-pay | Admitting: Physician Assistant

## 2022-01-12 DIAGNOSIS — M25511 Pain in right shoulder: Secondary | ICD-10-CM | POA: Diagnosis not present

## 2022-01-16 ENCOUNTER — Other Ambulatory Visit: Payer: Self-pay | Admitting: Rheumatology

## 2022-01-16 ENCOUNTER — Other Ambulatory Visit: Payer: Self-pay | Admitting: *Deleted

## 2022-01-16 DIAGNOSIS — M0609 Rheumatoid arthritis without rheumatoid factor, multiple sites: Secondary | ICD-10-CM

## 2022-01-16 DIAGNOSIS — Z79899 Other long term (current) drug therapy: Secondary | ICD-10-CM | POA: Diagnosis not present

## 2022-01-17 LAB — COMPLETE METABOLIC PANEL WITH GFR
AG Ratio: 1.4 (calc) (ref 1.0–2.5)
ALT: 11 U/L (ref 6–29)
AST: 15 U/L (ref 10–35)
Albumin: 4.2 g/dL (ref 3.6–5.1)
Alkaline phosphatase (APISO): 34 U/L — ABNORMAL LOW (ref 37–153)
BUN: 23 mg/dL (ref 7–25)
CO2: 27 mmol/L (ref 20–32)
Calcium: 8.9 mg/dL (ref 8.6–10.4)
Chloride: 102 mmol/L (ref 98–110)
Creat: 0.86 mg/dL (ref 0.60–1.00)
Globulin: 3.1 g/dL (calc) (ref 1.9–3.7)
Glucose, Bld: 71 mg/dL (ref 65–99)
Potassium: 4.4 mmol/L (ref 3.5–5.3)
Sodium: 137 mmol/L (ref 135–146)
Total Bilirubin: 0.5 mg/dL (ref 0.2–1.2)
Total Protein: 7.3 g/dL (ref 6.1–8.1)
eGFR: 71 mL/min/{1.73_m2} (ref >=60)

## 2022-01-17 LAB — CBC WITH DIFFERENTIAL/PLATELET
Absolute Monocytes: 585 cells/uL (ref 200–950)
Basophils Absolute: 31 cells/uL (ref 0–200)
Basophils Relative: 0.4 %
Eosinophils Absolute: 101 cells/uL (ref 15–500)
Eosinophils Relative: 1.3 %
HCT: 42.5 % (ref 35.0–45.0)
Hemoglobin: 14.3 g/dL (ref 11.7–15.5)
Lymphs Abs: 2488 cells/uL (ref 850–3900)
MCH: 30.2 pg (ref 27.0–33.0)
MCHC: 33.6 g/dL (ref 32.0–36.0)
MCV: 89.7 fL (ref 80.0–100.0)
MPV: 9.6 fL (ref 7.5–12.5)
Monocytes Relative: 7.5 %
Neutro Abs: 4594 cells/uL (ref 1500–7800)
Neutrophils Relative %: 58.9 %
Platelets: 323 10*3/uL (ref 140–400)
RBC: 4.74 10*6/uL (ref 3.80–5.10)
RDW: 14 % (ref 11.0–15.0)
Total Lymphocyte: 31.9 %
WBC: 7.8 10*3/uL (ref 3.8–10.8)

## 2022-01-20 ENCOUNTER — Telehealth: Payer: Self-pay | Admitting: Rheumatology

## 2022-01-20 NOTE — Telephone Encounter (Signed)
I called patient with lab results. 

## 2022-01-20 NOTE — Telephone Encounter (Signed)
Patient left a voicemail requesting a call back regarding her labs.

## 2022-01-29 DIAGNOSIS — M79645 Pain in left finger(s): Secondary | ICD-10-CM | POA: Diagnosis not present

## 2022-01-30 ENCOUNTER — Encounter: Payer: Self-pay | Admitting: Family Medicine

## 2022-01-30 ENCOUNTER — Ambulatory Visit (INDEPENDENT_AMBULATORY_CARE_PROVIDER_SITE_OTHER): Payer: PPO | Admitting: Family Medicine

## 2022-01-30 ENCOUNTER — Telehealth: Payer: Self-pay

## 2022-01-30 VITALS — BP 95/61 | HR 85 | Temp 97.7°F | Ht 64.0 in | Wt 194.0 lb

## 2022-01-30 DIAGNOSIS — E785 Hyperlipidemia, unspecified: Secondary | ICD-10-CM | POA: Diagnosis not present

## 2022-01-30 DIAGNOSIS — Z6834 Body mass index (BMI) 34.0-34.9, adult: Secondary | ICD-10-CM

## 2022-01-30 DIAGNOSIS — E1169 Type 2 diabetes mellitus with other specified complication: Secondary | ICD-10-CM

## 2022-01-30 DIAGNOSIS — Z01818 Encounter for other preprocedural examination: Secondary | ICD-10-CM | POA: Diagnosis not present

## 2022-01-30 LAB — POCT GLYCOSYLATED HEMOGLOBIN (HGB A1C)
HbA1c POC (<> result, manual entry): 6.2 % (ref 4.0–5.6)
HbA1c, POC (controlled diabetic range): 6.2 % (ref 0.0–7.0)
HbA1c, POC (prediabetic range): 6.2 % (ref 5.7–6.4)
Hemoglobin A1C: 6.2 % — AB (ref 4.0–5.6)

## 2022-01-30 NOTE — Telephone Encounter (Signed)
Form faxed

## 2022-01-30 NOTE — Telephone Encounter (Signed)
Pt sched for clearance today at 320. Form placed on PCP desk.

## 2022-01-30 NOTE — Telephone Encounter (Signed)
Completed and returned to [facility] basket. We will fax to ortho team by end of day

## 2022-01-30 NOTE — Progress Notes (Signed)
[name] , 1945-07-19, 76 y.o., female MRN: [id number] Patient Care Team    Relationship Specialty Notifications Start End  Ma Hillock, DO PCP - General Family Medicine  10/10/19   Bo Merino, MD Consulting Physician Rheumatology  10/10/19   Juanita Craver, MD Consulting Physician Gastroenterology  10/10/19   Leslie Andrea, OD  Optometry  10/10/19     Chief Complaint  Patient presents with   Pre-op Exam     Subjective: Pt presents for an OV for surgical risk assessment.  Prior anesthesia complications: n/a Family history of prior anesthesia complications: n/a Pulmonary: COPD- occasional smoker Endocrine: Diabetes Obesity:Body mass index is 33.3 kg/m. Chronic kidney disease:n/a Chronic med that needs to be continued:n/a Anticoagulation: n/a     07/30/2021   11:07 AM 02/21/2020    8:22 AM 02/15/2020    9:54 AM 10/26/2019    8:57 AM 10/18/2018    9:51 AM  Depression screen PHQ 2/9  Decreased Interest 0 0 0 0   Down, Depressed, Hopeless 0 0 0 0 0  PHQ - 2 Score 0 0 0 0 0    Allergies  Allergen Reactions   Lipitor [Atorvastatin Calcium]     Myalgia   Social History   Social History Narrative   Marital status/children/pets: Married, 1 child.    Education/employment: Secretary/administrator educated, retired.   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   Diverticulitis    Family history of colonic polyps 05/16/2020   History of blood transfusion    Hyperlipidemia    Insomnia    Internal hemorrhoids 05/16/2020   Nasal polyps    Obesity    PONV (postoperative nausea and vomiting)    Radial styloid tenosynovitis of  left hand 12/21/2019   Rectal bleeding 05/16/2020   Sinusitis, chronic 03/18/2010   Qualifier: Diagnosis of  By: Johnnye Sima MD, Dellis Filbert     Past Surgical History:  Procedure Laterality Date   ABDOMINAL HYSTERECTOMY  2000   APPENDECTOMY     CARDIAC CATHETERIZATION N/A 03/27/2015   Procedure: Left Heart Cath and Coronary Angiography;  Surgeon: Adrian Prows, MD;  Location: [facility];  Service: Cardiovascular;  Laterality: N/A;   CESAREAN SECTION     ELBOW SURGERY Right    FIXATION KYPHOPLASTY LUMBAR SPINE  10/16/2020   L2 and L3 kyphoplasty   KNEE ARTHROPLASTY     KYPHOPLASTY N/A 10/16/2020   Procedure: LUMBAR 2 AND LUMBAR 3 KYPHOPLASTY;  Surgeon: Phylliss Bob, MD;  Location: [facility];  Service: Orthopedics;  Laterality: N/A;   NASAL SINUS SURGERY     REPLACEMENT TOTAL KNEE Bilateral 2007 2009   TONSILLECTOMY AND ADENOIDECTOMY     WISDOM TOOTH EXTRACTION     Family History  Problem Relation Age of Onset   Arthritis Mother    Diabetes Mother        Type I   Aneurysm Father    Stroke Father    Arthritis Sister    Arthritis Sister    Down syndrome  Son    Diabetes Son        type I   Allergies as of 01/30/2022       Reactions   Lipitor [atorvastatin Calcium]    Myalgia        Medication List        Accurate as of January 30, 2022  3:40 PM. If you have any questions, ask your nurse or doctor.          B-D TB SYRINGE 1CC/27GX1/2" 27G X 1/2" 1 ML Misc Generic drug: TUBERCULIN SYR 1CC/27GX1/2" USE 1 SYRINGE ONCE WEEKLY TO INJECT METHOTREXATE.   cholecalciferol 25 MCG (1000 UNIT) tablet Commonly known as: VITAMIN D3 Take 1,000 Units by mouth daily.   fenofibrate 160 MG tablet Take 1 tablet (160 mg total) by mouth daily.   FISH OIL PO Take by mouth daily.   folic acid 1 MG tablet Commonly known as: FOLVITE TAKE 2 TABLETS BY MOUTH EVERY DAY   losartan 25 MG tablet Commonly known as: COZAAR Take 0.5 tablets (12.5 mg total) by mouth daily.   metFORMIN 500  MG tablet Commonly known as: GLUCOPHAGE Take 1 tablet (500 mg total) by mouth 2 (two) times daily.   methotrexate 2.5 MG tablet Commonly known as: RHEUMATREX Take 7 tablets (17.5 mg total) by mouth once a week. Please specify directions, refills and quantity   pravastatin 20 MG tablet Commonly known as: PRAVACHOL Take 1 tablet (20 mg total) by mouth daily.   sitaGLIPtin 50 MG tablet Commonly known as: JANUVIA Take 1 tablet (50 mg total) by mouth daily.   zolpidem 5 MG tablet Commonly known as: AMBIEN Take 1 tablet (5 mg total) by mouth at bedtime as needed for sleep.        All past medical history, surgical history, allergies, family history, immunizations andmedications were updated in the EMR today and reviewed under the history and medication portions of their EMR.     ROS Negative, with the exception of above mentioned in HPI   Objective:  BP 95/61   Pulse 85   Temp 97.7 F (36.5 C) (Oral)   Ht 5\' 4"  (1.626 m)   Wt 194 lb (88 kg)   SpO2 98%   BMI 33.30 kg/m  Body mass index is 33.3 kg/m. Physical Exam Vitals and nursing note reviewed.  Constitutional:      General: She is not in acute distress.    Appearance: Normal appearance. She is not ill-appearing or toxic-appearing.  HENT:     Head: Normocephalic and atraumatic.     Right Ear: Tympanic membrane, ear canal and external ear normal. There is no impacted cerumen.     Left Ear: Tympanic membrane, ear canal and external ear normal. There is no impacted cerumen.     Nose: No congestion or rhinorrhea.     Mouth/Throat:     Mouth: Mucous membranes are moist.     Pharynx: Oropharynx is clear. No oropharyngeal exudate or posterior oropharyngeal erythema.  Eyes:     General: No scleral icterus.       Right eye: No discharge.        Left eye: No discharge.     Extraocular Movements: Extraocular movements intact.     Conjunctiva/sclera: Conjunctivae normal.     Pupils: Pupils are equal, round, and reactive to  light.  Cardiovascular:     Rate and Rhythm: Normal rate and regular rhythm.     Pulses: Normal pulses.     Heart sounds:  Normal heart sounds. No murmur heard.    No friction rub. No gallop.  Pulmonary:     Effort: Pulmonary effort is normal. No respiratory distress.     Breath sounds: Normal breath sounds. No stridor. No wheezing, rhonchi or rales.  Chest:     Chest wall: No tenderness.  Musculoskeletal:        General: No swelling, tenderness or deformity. Normal range of motion.     Right lower leg: No edema.     Left lower leg: No edema.  Skin:    General: Skin is warm and dry.     Coloration: Skin is not jaundiced or pale.     Findings: No bruising, erythema, lesion or rash.  Neurological:     General: No focal deficit present.     Mental Status: She is alert and oriented to person, place, and time. Mental status is at baseline.     Cranial Nerves: No cranial nerve deficit.     Sensory: No sensory deficit.     Motor: No weakness.     Coordination: Coordination normal.     Gait: Gait normal.     Deep Tendon Reflexes: Reflexes normal.  Psychiatric:        Mood and Affect: Mood normal.        Behavior: Behavior normal.        Thought Content: Thought content normal.        Judgment: Judgment normal.     No results found. No results found. Results for orders placed or performed in visit on 01/30/22 (from the past 24 hour(s))  POCT HgB A1C     Status: Abnormal   Collection Time: 01/30/22  3:39 PM  Result Value Ref Range   Hemoglobin A1C 6.2 (A) 4.0 - 5.6 %   HbA1c POC (<> result, manual entry) 6.2 4.0 - 5.6 %   HbA1c, POC (prediabetic range) 6.2 5.7 - 6.4 %   HbA1c, POC (controlled diabetic range) 6.2 0.0 - 7.0 %    Assessment/Plan: [name] is a 76 y.o. female present for OV for  Hyperlipidemia associated with type 2 diabetes mellitus (HCC)/Morbid obesity (HCC)/BMI 34.0-34.9,adult - POCT HgB A1C>6.2 today- well controlled.   Pre-operative clearance Pt is  of average risk for medical complications  Avoid NSAID prior to procedure, per orthopedic team instruction.  Patient understands the purpose of preoperative visit is to attempt to minimize surgical complications and communicate to surgical team chronic conditions and management. Patient's chronic conditions have been stable.   No patient is free of risk when undergoing a procedure. The decision about whether to proceed with the operation belongs to the surgeon and the patient.   Reviewed expectations re: course of current medical issues. Discussed self-management of symptoms. Outlined signs and symptoms indicating need for more acute intervention. Patient verbalized understanding and all questions were answered. Patient received an After-Visit Summary.    Orders Placed This Encounter  Procedures   POCT HgB A1C   No orders of the defined types were placed in this encounter.  Referral Orders  No referral(s) requested today     Note is dictated utilizing voice recognition software. Although note has been proof read prior to signing, occasional typographical errors still can be missed. If any questions arise, please do not hesitate to call for verification.   electronically signed by:  Felix Pacini, DO  [facility] - OR

## 2022-02-04 DIAGNOSIS — X58XXXA Exposure to other specified factors, initial encounter: Secondary | ICD-10-CM | POA: Diagnosis not present

## 2022-02-04 DIAGNOSIS — M66242 Spontaneous rupture of extensor tendons, left hand: Secondary | ICD-10-CM | POA: Diagnosis not present

## 2022-02-04 DIAGNOSIS — S66212A Strain of extensor muscle, fascia and tendon of left thumb at wrist and hand level, initial encounter: Secondary | ICD-10-CM | POA: Diagnosis not present

## 2022-02-04 DIAGNOSIS — Y999 Unspecified external cause status: Secondary | ICD-10-CM | POA: Diagnosis not present

## 2022-02-04 HISTORY — PX: HAND TENDON SURGERY: SHX663

## 2022-02-09 NOTE — Progress Notes (Unsigned)
Office Visit Note  Patient: [name]             Date of Birth: 1946-02-02           MRN: [id number]             PCP: Natalia Leatherwood, DO Referring: Natalia Leatherwood, DO Visit Date: 02/10/2022 Occupation: @GUAROCC @  Subjective:  No chief complaint on file.   History of Present Illness: [name] is a 76 y.o. female ***   Activities of Daily Living:  Patient reports morning stiffness for *** {minute/hour:19697}.   Patient {ACTIONS;DENIES/REPORTS:21021675::"Denies"} nocturnal pain.  Difficulty dressing/grooming: {ACTIONS;DENIES/REPORTS:21021675::"Denies"} Difficulty climbing stairs: {ACTIONS;DENIES/REPORTS:21021675::"Denies"} Difficulty getting out of chair: {ACTIONS;DENIES/REPORTS:21021675::"Denies"} Difficulty using hands for taps, buttons, cutlery, and/or writing: {ACTIONS;DENIES/REPORTS:21021675::"Denies"}  No Rheumatology ROS completed.   PMFS History:  Patient Active Problem List   Diagnosis Date Noted  . BMI 34.0-34.9,adult 05/13/2021  . Morbid obesity (HCC) 05/16/2020  . Schamberg's purpura 10/26/2019  . Sleep disturbance 10/13/2019  . Vitamin D insufficiency 10/10/2019  . 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 Hypermetropia of both eyes 04/22/2018  . Hyperopia with presbyopia 04/22/2018  . Osteopenia 02/14/2016  . Benign hypertension 02/16/2012  . Occasional cigarette smoker 04/07/2010  . Hyperlipidemia associated with type 2 diabetes mellitus (HCC) 03/18/2010    Past Medical History:  Diagnosis Date  . Allergy   . Arthritis   . Chicken pox   . Colon polyp   . COPD (chronic obstructive pulmonary disease) (HCC)   . CVA (cerebral infarction)   . Diabetes mellitus without complication (HCC)   . 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 Diverticulitis   . Family history of colonic polyps 05/16/2020  . History of blood transfusion    . Hyperlipidemia   . Insomnia   . Internal hemorrhoids 05/16/2020  . Nasal polyps   . Obesity   . PONV (postoperative nausea and vomiting)   . Radial styloid tenosynovitis of left hand 12/21/2019  . Rectal bleeding 05/16/2020  . Sinusitis, chronic 03/18/2010   Qualifier: Diagnosis of  By: 03/20/2010 MD, Ninetta Lights      Family History  Problem Relation Age of Onset  . Arthritis Mother   . Diabetes Mother        Type I  . Aneurysm Father   . Stroke Father   . Arthritis Sister   . Arthritis Sister   . Down syndrome Son   . Diabetes Son        type I   Past Surgical History:  Procedure Laterality Date  . ABDOMINAL HYSTERECTOMY  2000  . APPENDECTOMY    . CARDIAC CATHETERIZATION N/A 03/27/2015   Procedure: Left Heart Cath and Coronary Angiography;  Surgeon: 03/29/2015, MD;  Location: [facility];  Service: Cardiovascular;  Laterality: N/A;  . CESAREAN SECTION    . ELBOW SURGERY Right   . FIXATION KYPHOPLASTY LUMBAR SPINE  10/16/2020   L2 and L3 kyphoplasty  . KNEE ARTHROPLASTY    . KYPHOPLASTY N/A 10/16/2020   Procedure: LUMBAR 2 AND LUMBAR 3 KYPHOPLASTY;  Surgeon: 10/18/2020, MD;  Location: [facility];  Service: Orthopedics;  Laterality: N/A;  . NASAL SINUS SURGERY    . REPLACEMENT TOTAL KNEE Bilateral 2007 2009  . TONSILLECTOMY AND ADENOIDECTOMY    . WISDOM TOOTH EXTRACTION     Social History   Social History Narrative   Marital status/children/pets:  Married, 1 child.    Education/employment: Secretary/administrator educated, retired.   Safety:      -smoke alarm in the home:Yes     - wears seatbelt: Yes     - Feels safe in their relationships: Yes   Immunization History  Administered Date(s) Administered  . Fluad Quad(high Dose 65+) 01/11/2019, 02/15/2020  . Influenza Whole 04/21/2010  . Influenza, High Dose Seasonal PF 02/20/2013, 02/07/2018, 01/30/2021  . Influenza,inj,Quad PF,6+ Mos 02/23/2017  . Influenza-Unspecified 02/15/2014, 01/30/2016, 02/07/2018, 01/11/2019  . PFIZER  Comirnaty(Gray Top)Covid-19 Tri-Sucrose Vaccine 10/09/2020  . PFIZER(Purple Top)SARS-COV-2 Vaccination 06/08/2019, 07/03/2019, 01/04/2020  . PNEUMOCOCCAL CONJUGATE-20 05/13/2021  . Pension scheme manager 44yrs & up 03/24/2021  . Pneumococcal Conjugate-13 02/20/2013  . Pneumococcal Polysaccharide-23 04/05/2006  . Pneumococcal-Unspecified 03/05/2015  . Tdap 09/14/2001, 03/05/2015  . Tetanus 03/27/2014  . Zoster Recombinat (Shingrix) 10/12/2017, 02/07/2018  . Zoster, Live 12/07/2013     Objective: Vital Signs: There were no vitals taken for this visit.   Physical Exam   Musculoskeletal Exam: ***  CDAI Exam: CDAI Score: -- Patient Global: --; Provider Global: -- Swollen: --; Tender: -- Joint Exam 02/10/2022   No joint exam has been documented for this visit   There is currently no information documented on the homunculus. Go to the Rheumatology activity and complete the homunculus joint exam.  Investigation: No additional findings.  Imaging: No results found.  Recent Labs: Lab Results  Component Value Date   WBC 7.8 01/16/2022   HGB 14.3 01/16/2022   PLT 323 01/16/2022   NA 137 01/16/2022   K 4.4 01/16/2022   CL 102 01/16/2022   CO2 27 01/16/2022   GLUCOSE 71 01/16/2022   BUN 23 01/16/2022   CREATININE 0.86 01/16/2022   BILITOT 0.5 01/16/2022   ALKPHOS 37 (L) 10/29/2021   AST 15 01/16/2022   ALT 11 01/16/2022   PROT 7.3 01/16/2022   ALBUMIN 4.3 10/29/2021   CALCIUM 8.9 01/16/2022   GFRAA 73 09/05/2020   QFTBGOLDPLUS NEGATIVE 06/28/2019    Speciality Comments: No specialty comments available.  Procedures:  No procedures performed Allergies: Lipitor [atorvastatin calcium]   Assessment / Plan:     Visit Diagnoses: No diagnosis found.  Orders: No orders of the defined types were placed in this encounter.  No orders of the defined types were placed in this encounter.   Face-to-face time spent with patient was *** minutes. Greater  than 50% of time was spent in counseling and coordination of care.  Follow-Up Instructions: No follow-ups on file.   Earnestine Mealing, CMA  Note - This record has been created using Editor, commissioning.  Chart creation errors have been sought, but may not always  have been located. Such creation errors do not reflect on  the standard of medical care.

## 2022-02-10 ENCOUNTER — Encounter: Payer: Self-pay | Admitting: Rheumatology

## 2022-02-10 ENCOUNTER — Ambulatory Visit: Payer: PPO | Attending: Rheumatology | Admitting: Rheumatology

## 2022-02-10 VITALS — BP 103/73 | HR 80 | Resp 17 | Ht 64.0 in | Wt 193.6 lb

## 2022-02-10 DIAGNOSIS — E118 Type 2 diabetes mellitus with unspecified complications: Secondary | ICD-10-CM | POA: Diagnosis not present

## 2022-02-10 DIAGNOSIS — E559 Vitamin D deficiency, unspecified: Secondary | ICD-10-CM

## 2022-02-10 DIAGNOSIS — T466X5A Adverse effect of antihyperlipidemic and antiarteriosclerotic drugs, initial encounter: Secondary | ICD-10-CM

## 2022-02-10 DIAGNOSIS — M8589 Other specified disorders of bone density and structure, multiple sites: Secondary | ICD-10-CM | POA: Diagnosis not present

## 2022-02-10 DIAGNOSIS — M0609 Rheumatoid arthritis without rheumatoid factor, multiple sites: Secondary | ICD-10-CM | POA: Diagnosis not present

## 2022-02-10 DIAGNOSIS — M791 Myalgia, unspecified site: Secondary | ICD-10-CM

## 2022-02-10 DIAGNOSIS — E1169 Type 2 diabetes mellitus with other specified complication: Secondary | ICD-10-CM | POA: Diagnosis not present

## 2022-02-10 DIAGNOSIS — Z789 Other specified health status: Secondary | ICD-10-CM

## 2022-02-10 DIAGNOSIS — H47013 Ischemic optic neuropathy, bilateral: Secondary | ICD-10-CM

## 2022-02-10 DIAGNOSIS — M65332 Trigger finger, left middle finger: Secondary | ICD-10-CM

## 2022-02-10 DIAGNOSIS — Z79899 Other long term (current) drug therapy: Secondary | ICD-10-CM | POA: Diagnosis not present

## 2022-02-10 DIAGNOSIS — M19071 Primary osteoarthritis, right ankle and foot: Secondary | ICD-10-CM

## 2022-02-10 DIAGNOSIS — M19072 Primary osteoarthritis, left ankle and foot: Secondary | ICD-10-CM

## 2022-02-10 DIAGNOSIS — I152 Hypertension secondary to endocrine disorders: Secondary | ICD-10-CM

## 2022-02-10 DIAGNOSIS — Z8709 Personal history of other diseases of the respiratory system: Secondary | ICD-10-CM

## 2022-02-10 DIAGNOSIS — R5383 Other fatigue: Secondary | ICD-10-CM

## 2022-02-10 DIAGNOSIS — E1159 Type 2 diabetes mellitus with other circulatory complications: Secondary | ICD-10-CM

## 2022-02-10 DIAGNOSIS — M19041 Primary osteoarthritis, right hand: Secondary | ICD-10-CM | POA: Diagnosis not present

## 2022-02-10 DIAGNOSIS — Z96653 Presence of artificial knee joint, bilateral: Secondary | ICD-10-CM | POA: Diagnosis not present

## 2022-02-10 DIAGNOSIS — E785 Hyperlipidemia, unspecified: Secondary | ICD-10-CM

## 2022-02-10 DIAGNOSIS — Z720 Tobacco use: Secondary | ICD-10-CM

## 2022-02-10 DIAGNOSIS — M19042 Primary osteoarthritis, left hand: Secondary | ICD-10-CM

## 2022-02-10 DIAGNOSIS — E1136 Type 2 diabetes mellitus with diabetic cataract: Secondary | ICD-10-CM

## 2022-02-10 MED ORDER — METHOTREXATE 2.5 MG PO TABS: 17.5000 mg | ORAL_TABLET | ORAL | 0 refills | Status: DC

## 2022-02-10 NOTE — Patient Instructions (Signed)
Standing Labs We placed an order today for your standing lab work.   Please have your standing labs drawn in December and every 3 months  Please have your labs drawn 2 weeks prior to your appointment so that the provider can discuss your lab results at your appointment.  Please note that you may see your imaging and lab results in MyChart before we have reviewed them. We will contact you once all results are reviewed. Please allow our office up to 72 hours to thoroughly review all of the results before contacting the office for clarification of your results.  Lab hours are: Monday through Thursday from 1:30 pm-4:30 pm and Friday from 1:30 pm- 4:00 pm  You may experience shorter wait times on Monday, Thursday or Friday afternoons,.   Effective March 02, 2022, new lab hours will be: Monday through Thursday from 8:00 am -12:30 pm and 1:00 pm-5:00 pm and Friday from 8:00 am-12:00 pm.  Please be advised, all patients with office appointments requiring lab work will take precedent over walk-in lab work.   Labs are drawn by [facility]. Please bring your co-pay at the time of your lab draw.  You may receive a bill from [facility] for your lab work.  Please note if you are on Hydroxychloroquine and and an order has been placed for a Hydroxychloroquine level, you will need to have it drawn 4 hours or more after your last dose.  If you wish to have your labs drawn at another location, please call the office 24 hours in advance so we can fax the orders.  The office is located at [address], [address], [place], [place] [address] No appointment is necessary.    If you have any questions regarding directions or hours of operation,  please call [phone].   As a reminder, please drink plenty of water prior to coming for your lab work. Thanks!   Vaccines You are taking a medication(s) that can suppress your immune system.  The following immunizations are recommended: Flu annually Covid-19  Td/Tdap  (tetanus, diphtheria, pertussis) every 10 years Pneumonia (Prevnar 15 then Pneumovax 23 at least 1 year apart.  Alternatively, can take Prevnar 20 without needing additional dose) Shingrix: 2 doses from 4 weeks to 6 months apart  Please check with your PCP to make sure you are up to date.   If you have signs or symptoms of an infection or start antibiotics: First, call your PCP for workup of your infection. Hold your medication through the infection, until you complete your antibiotics, and until symptoms resolve if you take the following: Injectable medication (Actemra, Benlysta, Cimzia, Cosentyx, Enbrel, Humira, Kevzara, Orencia, Remicade, Simponi, Stelara, Taltz, Tremfya) Methotrexate Leflunomide (Arava) Mycophenolate (Cellcept) Xeljanz, Olumiant, or Rinvoq  

## 2022-02-13 DIAGNOSIS — M25642 Stiffness of left hand, not elsewhere classified: Secondary | ICD-10-CM | POA: Diagnosis not present

## 2022-02-24 ENCOUNTER — Ambulatory Visit: Payer: PPO | Admitting: Rheumatology

## 2022-02-26 DIAGNOSIS — M25642 Stiffness of left hand, not elsewhere classified: Secondary | ICD-10-CM | POA: Diagnosis not present

## 2022-03-06 DIAGNOSIS — M25642 Stiffness of left hand, not elsewhere classified: Secondary | ICD-10-CM | POA: Diagnosis not present

## 2022-03-17 DIAGNOSIS — M25642 Stiffness of left hand, not elsewhere classified: Secondary | ICD-10-CM | POA: Diagnosis not present

## 2022-03-25 DIAGNOSIS — M25642 Stiffness of left hand, not elsewhere classified: Secondary | ICD-10-CM | POA: Diagnosis not present

## 2022-03-31 DIAGNOSIS — M25642 Stiffness of left hand, not elsewhere classified: Secondary | ICD-10-CM | POA: Diagnosis not present

## 2022-04-09 ENCOUNTER — Other Ambulatory Visit: Payer: Self-pay | Admitting: Rheumatology

## 2022-04-13 ENCOUNTER — Other Ambulatory Visit: Payer: Self-pay | Admitting: Rheumatology

## 2022-04-13 ENCOUNTER — Other Ambulatory Visit: Payer: Self-pay

## 2022-04-13 DIAGNOSIS — Z79899 Other long term (current) drug therapy: Secondary | ICD-10-CM

## 2022-04-13 DIAGNOSIS — M0609 Rheumatoid arthritis without rheumatoid factor, multiple sites: Secondary | ICD-10-CM | POA: Diagnosis not present

## 2022-04-14 LAB — COMPLETE METABOLIC PANEL WITH GFR
AG Ratio: 1.4 (calc) (ref 1.0–2.5)
ALT: 13 U/L (ref 6–29)
AST: 21 U/L (ref 10–35)
Albumin: 4.3 g/dL (ref 3.6–5.1)
Alkaline phosphatase (APISO): 40 U/L (ref 37–153)
BUN: 21 mg/dL (ref 7–25)
CO2: 25 mmol/L (ref 20–32)
Calcium: 9.2 mg/dL (ref 8.6–10.4)
Chloride: 103 mmol/L (ref 98–110)
Creat: 0.93 mg/dL (ref 0.60–1.00)
Globulin: 3 g/dL (calc) (ref 1.9–3.7)
Glucose, Bld: 128 mg/dL — ABNORMAL HIGH (ref 65–99)
Potassium: 4.4 mmol/L (ref 3.5–5.3)
Sodium: 139 mmol/L (ref 135–146)
Total Bilirubin: 0.4 mg/dL (ref 0.2–1.2)
Total Protein: 7.3 g/dL (ref 6.1–8.1)
eGFR: 64 mL/min/{1.73_m2} (ref >=60)

## 2022-04-14 LAB — CBC WITH DIFFERENTIAL/PLATELET
Absolute Monocytes: 448 cells/uL (ref 200–950)
Basophils Absolute: 41 cells/uL (ref 0–200)
Basophils Relative: 0.7 %
Eosinophils Absolute: 118 cells/uL (ref 15–500)
Eosinophils Relative: 2 %
HCT: 41.3 % (ref 35.0–45.0)
Hemoglobin: 14 g/dL (ref 11.7–15.5)
Lymphs Abs: 1540 cells/uL (ref 850–3900)
MCH: 30.6 pg (ref 27.0–33.0)
MCHC: 33.9 g/dL (ref 32.0–36.0)
MCV: 90.2 fL (ref 80.0–100.0)
MPV: 9.9 fL (ref 7.5–12.5)
Monocytes Relative: 7.6 %
Neutro Abs: 3752 cells/uL (ref 1500–7800)
Neutrophils Relative %: 63.6 %
Platelets: 297 10*3/uL (ref 140–400)
RBC: 4.58 10*6/uL (ref 3.80–5.10)
RDW: 13.7 % (ref 11.0–15.0)
Total Lymphocyte: 26.1 %
WBC: 5.9 10*3/uL (ref 3.8–10.8)

## 2022-04-14 NOTE — Progress Notes (Signed)
CBC and CMP are normal except glucose is mildly elevated, probably not a fasting sample.

## 2022-04-15 ENCOUNTER — Ambulatory Visit (INDEPENDENT_AMBULATORY_CARE_PROVIDER_SITE_OTHER): Payer: PPO | Admitting: Family Medicine

## 2022-04-15 ENCOUNTER — Ambulatory Visit
Admission: RE | Admit: 2022-04-15 | Discharge: 2022-04-15 | Disposition: A | Discharge status: Home / Self Care | Payer: PPO | Source: Ambulatory Visit | Attending: Family Medicine | Admitting: Family Medicine

## 2022-04-15 ENCOUNTER — Encounter: Payer: Self-pay | Admitting: Family Medicine

## 2022-04-15 VITALS — BP 108/69 | HR 74 | Temp 98.6°F | Ht 64.0 in | Wt 196.0 lb

## 2022-04-15 DIAGNOSIS — I10 Essential (primary) hypertension: Secondary | ICD-10-CM | POA: Diagnosis not present

## 2022-04-15 DIAGNOSIS — G479 Sleep disorder, unspecified: Secondary | ICD-10-CM

## 2022-04-15 DIAGNOSIS — E1169 Type 2 diabetes mellitus with other specified complication: Secondary | ICD-10-CM

## 2022-04-15 DIAGNOSIS — Z780 Asymptomatic menopausal state: Secondary | ICD-10-CM | POA: Diagnosis not present

## 2022-04-15 DIAGNOSIS — E785 Hyperlipidemia, unspecified: Secondary | ICD-10-CM

## 2022-04-15 DIAGNOSIS — E559 Vitamin D deficiency, unspecified: Secondary | ICD-10-CM

## 2022-04-15 DIAGNOSIS — M8589 Other specified disorders of bone density and structure, multiple sites: Secondary | ICD-10-CM | POA: Diagnosis not present

## 2022-04-15 DIAGNOSIS — Z6834 Body mass index (BMI) 34.0-34.9, adult: Secondary | ICD-10-CM

## 2022-04-15 DIAGNOSIS — M85851 Other specified disorders of bone density and structure, right thigh: Secondary | ICD-10-CM | POA: Diagnosis not present

## 2022-04-15 DIAGNOSIS — Z1231 Encounter for screening mammogram for malignant neoplasm of breast: Secondary | ICD-10-CM

## 2022-04-15 LAB — POCT GLYCOSYLATED HEMOGLOBIN (HGB A1C)
HbA1c POC (<> result, manual entry): 6.1 % (ref 4.0–5.6)
HbA1c, POC (controlled diabetic range): 6.1 % (ref 0.0–7.0)
HbA1c, POC (prediabetic range): 6.1 % (ref 5.7–6.4)
Hemoglobin A1C: 6.1 % — AB (ref 4.0–5.6)

## 2022-04-15 MED ORDER — METFORMIN HCL 500 MG PO TABS: 500.0000 mg | ORAL_TABLET | Freq: Two times a day (BID) | ORAL | 1 refills | Status: DC

## 2022-04-15 MED ORDER — SITAGLIPTIN PHOSPHATE 50 MG PO TABS: 50.0000 mg | ORAL_TABLET | Freq: Every day | ORAL | 1 refills | Status: DC

## 2022-04-15 MED ORDER — LOSARTAN POTASSIUM 25 MG PO TABS: 12.5000 mg | ORAL_TABLET | Freq: Every day | ORAL | 1 refills | Status: DC

## 2022-04-15 MED ORDER — FENOFIBRATE 160 MG PO TABS: 160.0000 mg | ORAL_TABLET | Freq: Every day | ORAL | 3 refills | Status: DC

## 2022-04-15 NOTE — Progress Notes (Signed)
Patient ID: [name], female  DOB: [birth year], 76 y.o.   MRN: [id number] Patient Care Team    Relationship Specialty Notifications Start End  Natalia Leatherwood, DO PCP - General Family Medicine  10/10/19   Pollyann Savoy, MD Consulting Physician Rheumatology  10/10/19   Charna Elizabeth, MD Consulting Physician Gastroenterology  10/10/19   Renae Fickle, OD  Optometry  10/10/19     Chief Complaint  Patient presents with   Diabetes    [facility]; pt is not fasting    Subjective:  [name] is a 76 y.o.  female present for [facility] All past medical history, surgical history, allergies, family history, immunizations, medications and social history were updated in the electronic medical record today. All recent labs, ED visits and hospitalizations within the last year were reviewed.  Hypertension associated with diabetes (HCC)/obesity/statin intolerance/hyperlipidemia Pt reports compliance with losartan 12.5 mg daily, pravastatin and fenofibrate.  Blood pressures ranges at home not routinely checked.  Patient denies chest pain, shortness of breath, dizziness or lower extremity edema.  Pt takes a daily baby ASA.       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  Patient is prescribed methotrexate and folate.   Type 2 diabetes mellitus with unspecified complications (HCC)/cataracts associated with type 2 diabetes Pt reports compliance with metformin 500 mg twice daily and Januvia.   She was diagnosed with diabetes when she was around 52.   Patient denies dizziness, hyperglycemic or hypoglycemic events. Patient denies numbness, tingling in the extremities or nonhealing wounds of feet.     vitamin D deficiency She reports she continues to supplement vitamin D 2000 units daily.   Sleep disturbance Patient is prescribed Ambien 5 mg nightly as needed.   She very rarely takes this medication.  When she does she takes  the medication she takes one third of a tab.   Last refill 05/13/2021 - #90.     04/15/2022    1:06 PM 07/30/2021   11:07 AM 02/21/2020    8:22 AM 02/15/2020    9:54 AM 10/26/2019    8:57 AM  Depression screen PHQ 2/9  Decreased Interest 0 0 0 0 0  Down, Depressed, Hopeless 0 0 0 0 0  PHQ - 2 Score 0 0 0 0 0       No data to display                04/15/2022    1:06 PM 01/30/2022    1:33 PM 10/29/2021    8:54 AM 07/30/2021   11:09 AM 05/13/2021    9:24 AM  Fall Risk   Falls in the past year? 0 0 0 1 0  Number falls in past yr: 0  0 1 0  Injury with Fall? 0  0 1 0  Comment    back injury   Risk for fall due to : No Fall Risks   Impaired vision No Fall Risks  Follow up Falls evaluation completed  Falls evaluation completed Falls prevention discussed Falls evaluation completed     Immunization History  Administered Date(s) Administered   COVID-19, mRNA, vaccine(Comirnaty)12 years and older 03/27/2022   Fluad Quad(high Dose 65+) 01/11/2019, 02/15/2020, 03/06/2022   Influenza Whole 04/21/2010   Influenza, High Dose Seasonal PF 02/20/2013, 02/07/2018, 01/30/2021   Influenza,inj,Quad PF,6+ Mos 02/23/2017   Influenza-Unspecified 02/15/2014, 01/30/2016, 02/07/2018, 01/11/2019   PFIZER Comirnaty(Gray Top)Covid-19 Tri-Sucrose Vaccine 10/09/2020   PFIZER(Purple  Top)SARS-COV-2 Vaccination 06/08/2019, 07/03/2019, 01/04/2020   PNEUMOCOCCAL CONJUGATE-20 05/13/2021   Pfizer Covid-19 Vaccine Bivalent Booster 50yrs & up 03/24/2021   Pneumococcal Conjugate-13 02/20/2013   Pneumococcal Polysaccharide-23 04/05/2006   Pneumococcal-Unspecified 03/05/2015   Tdap 09/14/2001, 03/05/2015   Tetanus 03/27/2014   Zoster Recombinat (Shingrix) 10/12/2017, 02/07/2018   Zoster, Live 12/07/2013    No results found.  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   Diverticulitis    Family history of colonic polyps 05/16/2020   History of blood transfusion    Hyperlipidemia    Insomnia    Internal hemorrhoids 05/16/2020   Nasal polyps    Obesity    PONV (postoperative nausea and vomiting)    Radial styloid tenosynovitis of left hand 12/21/2019   Rectal bleeding 05/16/2020   Sinusitis, chronic 03/18/2010   Qualifier: Diagnosis of  By: Ninetta Lights MD, Tinnie Gens     Allergies  Allergen Reactions   Lipitor [Atorvastatin Calcium]     Myalgia   Past Surgical History:  Procedure Laterality Date   ABDOMINAL HYSTERECTOMY  2000   APPENDECTOMY     CARDIAC CATHETERIZATION N/A 03/27/2015   Procedure: Left Heart Cath and Coronary Angiography;  Surgeon: Yates Decamp, MD;  Location[facility] INVASIVE CV LAB;  Service: Cardiovascular;  Laterality: N/A;   CESAREAN SECTION     ELBOW SURGERY Right    FIXATION KYPHOPLASTY LUMBAR SPINE  10/16/2020   L2 and L3 kyphoplasty   HAND TENDON SURGERY Left 02/04/2022   KNEE ARTHROPLASTY     KYPHOPLASTY N/A 10/16/2020   Procedure: LUMBAR 2 AND LUMBAR 3 KYPHOPLASTY;  Surgeon: Estill Bamberg, MD;  Location: [facility] OR;  Service: Orthopedics;  Laterality: N/A;   NASAL SINUS SURGERY     REPLACEMENT TOTAL KNEE Bilateral 2007 2009   TONSILLECTOMY AND ADENOIDECTOMY     WISDOM TOOTH EXTRACTION     Family History  Problem Relation Age of Onset   Arthritis Mother    Diabetes Mother        Type I   Aneurysm Father    Stroke Father    Arthritis Sister    Arthritis Sister    Down syndrome Son    Diabetes Son        type I   Social History   Social History Narrative   Marital status/children/pets: Married, 1 child.    Education/employment: Automotive engineer educated, retired.   Safety:      -smoke alarm in the home:Yes     - wears seatbelt: Yes     - Feels safe in their relationships: Yes    Allergies as of 04/15/2022       Reactions   Lipitor  [atorvastatin Calcium]    Myalgia        Medication List        Accurate as of April 15, 2022  1:18 PM. If you have any questions, ask your nurse or doctor.          STOP taking these medications    B-D TB SYRINGE 1CC/27GX1/2" 27G X 1/2" 1 ML Misc Generic drug: TUBERCULIN SYR 1CC/27GX1/2" Stopped by: Felix Pacini, DO   TYLENOL PO Stopped by: Felix Pacini, DO       TAKE these medications  aspirin EC 81 MG tablet Take 81 mg by mouth daily. Swallow whole.   cholecalciferol 25 MCG (1000 UNIT) tablet Commonly known as: VITAMIN D3 Take 1,000 Units by mouth daily.   fenofibrate 160 MG tablet Take 1 tablet (160 mg total) by mouth daily.   FISH OIL PO Take by mouth daily.   folic acid 1 MG tablet Commonly known as: FOLVITE TAKE 2 TABLETS BY MOUTH EVERY DAY   losartan 25 MG tablet Commonly known as: COZAAR Take 0.5 tablets (12.5 mg total) by mouth daily.   metFORMIN 500 MG tablet Commonly known as: GLUCOPHAGE Take 1 tablet (500 mg total) by mouth 2 (two) times daily.   methotrexate 2.5 MG tablet Commonly known as: RHEUMATREX Take 7 tablets (17.5 mg total) by mouth once a week. Please specify directions, refills and quantity   pravastatin 20 MG tablet Commonly known as: PRAVACHOL Take 1 tablet (20 mg total) by mouth daily.   sitaGLIPtin 50 MG tablet Commonly known as: JANUVIA Take 1 tablet (50 mg total) by mouth daily.   zolpidem 5 MG tablet Commonly known as: AMBIEN Take 1 tablet (5 mg total) by mouth at bedtime as needed for sleep.        All past medical history, surgical history, allergies, family history, immunizations andmedications were updated in the EMR today and reviewed under the history and medication portions of their EMR.      MM 3D SCREEN BREAST BILATERAL Result Date: 11/26/2020 RECOMMENDATION: Screening mammogram in one year. (Code:SM-B-01Y) BI-RADS CATEGORY  1: Negative. Electronically Signed   By: Bary RichardStan  Maynard M.D.   On:  11/26/2020 11:22    ROS 14 pt review of systems performed and negative (unless mentioned in an HPI)  Objective: BP 108/69   Pulse 74   Temp 98.6 F (37 C) (Oral)   Ht 5\' 4"  (1.626 m)   Wt 196 lb (88.9 kg)   SpO2 98%   BMI 33.64 kg/m  Physical Exam Vitals and nursing note reviewed.  Constitutional:      General: She is not in acute distress.    Appearance: Normal appearance. She is not ill-appearing, toxic-appearing or diaphoretic.  HENT:     Head: Normocephalic and atraumatic.  Eyes:     General: No scleral icterus.       Right eye: No discharge.        Left eye: No discharge.     Extraocular Movements: Extraocular movements intact.     Conjunctiva/sclera: Conjunctivae normal.     Pupils: Pupils are equal, round, and reactive to light.  Cardiovascular:     Rate and Rhythm: Normal rate and regular rhythm.  Pulmonary:     Effort: Pulmonary effort is normal. No respiratory distress.     Breath sounds: Normal breath sounds. No wheezing, rhonchi or rales.  Skin:    General: Skin is warm and dry.     Coloration: Skin is not jaundiced or pale.     Findings: No erythema or rash.  Neurological:     Mental Status: She is alert and oriented to person, place, and time. Mental status is at baseline.     Motor: No weakness.     Gait: Gait normal.  Psychiatric:        Mood and Affect: Mood normal.        Behavior: Behavior normal.        Thought Content: Thought content normal.        Judgment: Judgment normal.  Assessment/plan: [name] is a 77 y.o. female present for [facility] Hypertension/Hyperlipidema/morbid obesity stable Continue losartan 12.5 mg qd Continue pravastatin -Continue omega-3 and fenofibrate. Labs up-to-date  Osteopenia of multiple sites/vitamin D deficiency Stable Continue vitamin D 2000 units daily> Labs up-to-date Bone density scheduled for this week   diabetes (HCC)/obesity/cataracts associated with diabetes diabetes associated with  hyperlipidemia Stable Continue metformin 500 mg twice daily for now> had SE to higher dose.  Continue Januvia 50 mg daily Continue routine exercise and low-sodium diet. 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  She is managed closely by her ophthalmology team Dr. Antionette Char has APPT in April 2022- retinopathy present.  A1c:7.4>>7.6>6.5>6.4> 6.5 > 6.2> 6.1 A1c collected today Follow-up in 5 months with other chronic conditions.   Rheumatoid arthritis of multiple sites with negative rheumatoid factor (HCC) Prescribed methotrexate and folate managed by rheumatology.Stable.    Sleep disturbance: Stable continue Ambien half tab as needed-rarely uses medication. Patient reports she did not need medication refill today. [place] controlled substance database reviewed 04/15/22  Return in about 24 weeks (around 09/30/2022).  Orders Placed This Encounter  Procedures   POCT HgB A1C   Meds ordered this encounter  Medications   fenofibrate 160 MG tablet    Sig: Take 1 tablet (160 mg total) by mouth daily.    Dispense:  90 tablet    Refill:  3   losartan (COZAAR) 25 MG tablet    Sig: Take 0.5 tablets (12.5 mg total) by mouth daily.    Dispense:  45 tablet    Refill:  1   metFORMIN (GLUCOPHAGE) 500 MG tablet    Sig: Take 1 tablet (500 mg total) by mouth 2 (two) times daily.    Dispense:  180 tablet    Refill:  1   sitaGLIPtin (JANUVIA) 50 MG tablet    Sig: Take 1 tablet (50 mg total) by mouth daily.    Dispense:  90 tablet    Refill:  1   Referral Orders  No referral(s) requested today     Note is dictated utilizing voice recognition software. Although note has been proof read prior to signing, occasional typographical errors still can be missed. If any questions arise, please do not hesitate to call  for verification.  Electronically signed by: Felix Pacini, DO [facility] [place]

## 2022-04-15 NOTE — Patient Instructions (Addendum)
Return in about 24 weeks (around 09/30/2022).        Great to see you today.  I have refilled the medication(s) we provide.   If labs were collected, we will inform you of lab results once received either by echart message or telephone call.   - echart message- for normal results that have been seen by the patient already.   - telephone call: abnormal results or if patient has not viewed results in their echart.  

## 2022-05-12 ENCOUNTER — Other Ambulatory Visit: Payer: Self-pay | Admitting: Physician Assistant

## 2022-05-12 ENCOUNTER — Other Ambulatory Visit: Payer: Self-pay | Admitting: Family Medicine

## 2022-05-12 ENCOUNTER — Other Ambulatory Visit: Payer: Self-pay | Admitting: Rheumatology

## 2022-05-12 DIAGNOSIS — M0609 Rheumatoid arthritis without rheumatoid factor, multiple sites: Secondary | ICD-10-CM

## 2022-05-12 NOTE — Telephone Encounter (Signed)
Next Visit: 07/21/2022   Last Visit: 02/10/2022   Last Fill: 02/10/2022   Dx: Rheumatoid arthritis of multiple sites with negative rheumatoid factor    Current Dose per office note on 02/10/2022: Methotrexate 7 tablets once weekly   Labs: 04/13/2022 CBC and CMP are normal except glucose is mildly elevated, probably not a fasting sample.    Okay to refill MTX?

## 2022-05-12 NOTE — Telephone Encounter (Signed)
Next Visit: 07/21/2022  Last Visit: 02/10/2022  Last Fill: 04/23/2021  Dx: Rheumatoid arthritis of multiple sites with negative rheumatoid factor   Current Dose per office note on 26/83/4196: folic acid 2 mg by mouth daily   Okay to refill Folic Acid?

## 2022-05-14 ENCOUNTER — Ambulatory Visit (INDEPENDENT_AMBULATORY_CARE_PROVIDER_SITE_OTHER): Payer: PPO | Admitting: Family Medicine

## 2022-05-14 ENCOUNTER — Encounter: Payer: Self-pay | Admitting: Family Medicine

## 2022-05-14 VITALS — BP 94/60 | HR 82 | Temp 97.8°F | Ht 64.0 in | Wt 198.0 lb

## 2022-05-14 DIAGNOSIS — R051 Acute cough: Secondary | ICD-10-CM | POA: Diagnosis not present

## 2022-05-14 DIAGNOSIS — J180 Bronchopneumonia, unspecified organism: Secondary | ICD-10-CM | POA: Diagnosis not present

## 2022-05-14 DIAGNOSIS — R062 Wheezing: Secondary | ICD-10-CM | POA: Diagnosis not present

## 2022-05-14 MED ORDER — BENZONATATE 200 MG PO CAPS: 200.0000 mg | ORAL_CAPSULE | Freq: Two times a day (BID) | ORAL | 0 refills | Status: DC | PRN

## 2022-05-14 MED ORDER — METHYLPREDNISOLONE ACETATE 80 MG/ML IJ SUSP
80.0000 mg | Freq: Once | INTRAMUSCULAR | Status: AC
  Administered 2022-05-14: 80 mg via INTRAMUSCULAR

## 2022-05-14 MED ORDER — PREDNISONE 20 MG PO TABS: ORAL_TABLET | ORAL | 0 refills | Status: DC

## 2022-05-14 MED ORDER — DOXYCYCLINE HYCLATE 100 MG PO TABS: 100.0000 mg | ORAL_TABLET | Freq: Two times a day (BID) | ORAL | 0 refills | Status: DC

## 2022-05-14 MED ORDER — IPRATROPIUM-ALBUTEROL 0.5-2.5 (3) MG/3ML IN SOLN
3.0000 mL | Freq: Once | RESPIRATORY_TRACT | Status: AC
  Administered 2022-05-14: 3 mL via RESPIRATORY_TRACT

## 2022-05-14 MED ORDER — FLUTICASONE PROPIONATE 50 MCG/ACT NA SUSP: NASAL | 6 refills | Status: DC

## 2022-05-14 NOTE — Progress Notes (Signed)
[name] , 12/13/1945, 77 y.o., female MRN: [id number] Patient Care Team    Relationship Specialty Notifications Start End  Natalia Leatherwood, DO PCP - General Family Medicine  10/10/19   Pollyann Savoy, MD Consulting Physician Rheumatology  10/10/19   Charna Elizabeth, MD Consulting Physician Gastroenterology  10/10/19   Renae Fickle, OD  Optometry  10/10/19     Chief Complaint  Patient presents with   Cough    Pt c/o productive cough, wheezing, SOBnasal congestion, post nasal drip, x 4 days; pt tested pos for COVID 04/22/22 during after flight to [place]       Subjective: Pt presents for an OV with complaints of cough, wheezing, feeling short of breath/chest congestion, nasal congestion, postnasal drip of 4 days duration.  She tested positive for COVID just before Christmas.  When she returned from [place] last week, she then started to become ill again within 2 to 3 days.   Pt has tried cough syrup to ease their symptoms.  Denies fevers or chills.  She endorses fatigue and bilateral ear discomfort.     04/15/2022    1:06 PM 07/30/2021   11:07 AM 02/21/2020    8:22 AM 02/15/2020    9:54 AM 10/26/2019    8:57 AM  Depression screen PHQ 2/9  Decreased Interest 0 0 0 0 0  Down, Depressed, Hopeless 0 0 0 0 0  PHQ - 2 Score 0 0 0 0 0    Allergies  Allergen Reactions   Lipitor [Atorvastatin Calcium]     Myalgia   Social History   Social History Narrative   Marital status/children/pets: Married, 1 child.    Education/employment: Automotive engineer educated, retired.   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   Diverticulitis     Family history of colonic polyps 05/16/2020   History of blood transfusion    Hyperlipidemia    Insomnia    Internal hemorrhoids 05/16/2020   Nasal polyps    Obesity    PONV (postoperative nausea and vomiting)    Radial styloid tenosynovitis of left hand 12/21/2019   Rectal bleeding 05/16/2020   Sinusitis, chronic 03/18/2010   Qualifier: Diagnosis of  By: Ninetta Lights MD, Tinnie Gens     Past Surgical History:  Procedure Laterality Date   ABDOMINAL HYSTERECTOMY  2000   APPENDECTOMY     CARDIAC CATHETERIZATION N/A 03/27/2015   Procedure: Left Heart Cath and Coronary Angiography;  Surgeon: Yates Decamp, MD;  Location: [facility] INVASIVE CV LAB;  Service: Cardiovascular;  Laterality: N/A;   CESAREAN SECTION     ELBOW SURGERY Right    FIXATION KYPHOPLASTY LUMBAR SPINE  10/16/2020   L2 and L3 kyphoplasty   HAND TENDON SURGERY Left 02/04/2022   KNEE ARTHROPLASTY     KYPHOPLASTY N/A 10/16/2020   Procedure: LUMBAR 2 AND LUMBAR 3 KYPHOPLASTY;  Surgeon: Estill Bamberg, MD;  Location: [facility] OR;  Service: Orthopedics;  Laterality: N/A;   NASAL SINUS SURGERY     REPLACEMENT TOTAL KNEE Bilateral 2007 2009  TONSILLECTOMY AND ADENOIDECTOMY     WISDOM TOOTH EXTRACTION     Family History  Problem Relation Age of Onset   Arthritis Mother    Diabetes Mother        Type I   Aneurysm Father    Stroke Father    Arthritis Sister    Arthritis Sister    Down syndrome Son    Diabetes Son        type I   Allergies as of 05/14/2022       Reactions   Lipitor [atorvastatin Calcium]    Myalgia        Medication List        Accurate as of May 14, 2022 11:56 AM. If you have any questions, ask your nurse or doctor.          aspirin EC 81 MG tablet Take 81 mg by mouth daily. Swallow whole.   benzonatate 200 MG capsule Commonly known as: TESSALON Take 1 capsule (200 mg total) by mouth 2 (two) times daily as needed for cough. Started by: Felix Pacini, DO   cholecalciferol 25 MCG (1000 UNIT)  tablet Commonly known as: VITAMIN D3 Take 1,000 Units by mouth daily.   doxycycline 100 MG tablet Commonly known as: VIBRA-TABS Take 1 tablet (100 mg total) by mouth 2 (two) times daily. Started by: Felix Pacini, DO   fenofibrate 160 MG tablet Take 1 tablet (160 mg total) by mouth daily.   FISH OIL PO Take by mouth daily.   fluticasone 50 MCG/ACT nasal spray Commonly known as: FLONASE 1 spray each nostril twice daily Started by: Felix Pacini, DO   folic acid 1 MG tablet Commonly known as: FOLVITE TAKE 2 TABLETS BY MOUTH EVERY DAY   losartan 25 MG tablet Commonly known as: COZAAR Take 0.5 tablets (12.5 mg total) by mouth daily.   metFORMIN 500 MG tablet Commonly known as: GLUCOPHAGE Take 1 tablet (500 mg total) by mouth 2 (two) times daily.   methotrexate 2.5 MG tablet Commonly known as: RHEUMATREX TAKE 7 TABLETS (17.5 MG TOTAL) BY MOUTH ONCE A WEEK.   pravastatin 20 MG tablet Commonly known as: PRAVACHOL TAKE 1 TABLET BY MOUTH EVERY DAY   predniSONE 20 MG tablet Commonly known as: DELTASONE 60 mg x2d, 40 mg x3d, 20 mg x2d, 10 mg x2d Started by: Felix Pacini, DO   sitaGLIPtin 50 MG tablet Commonly known as: JANUVIA Take 1 tablet (50 mg total) by mouth daily.   zolpidem 5 MG tablet Commonly known as: AMBIEN Take 1 tablet (5 mg total) by mouth at bedtime as needed for sleep.        All past medical history, surgical history, allergies, family history, immunizations andmedications were updated in the EMR today and reviewed under the history and medication portions of their EMR.     ROS Negative, with the exception of above mentioned in HPI   Objective:  BP 94/60   Pulse 82   Temp 97.8 F (36.6 C) (Oral)   Ht 5\' 4"  (1.626 m)   Wt 198 lb (89.8 kg)   SpO2 96%   BMI 33.99 kg/m  Body mass index is 33.99 kg/m. Physical Exam Vitals and nursing note reviewed.  Constitutional:      General: She is not in acute distress.    Appearance: Normal  appearance. She is normal weight. She is not ill-appearing or toxic-appearing.  HENT:     Head: Normocephalic and atraumatic.     Right Ear: Ear  canal and external ear normal.     Left Ear: Tympanic membrane, ear canal and external ear normal.     Ears:     Comments: Moderate right ear effusion    Nose: Congestion and rhinorrhea present.     Mouth/Throat:     Mouth: Mucous membranes are moist.     Pharynx: Oropharyngeal exudate and posterior oropharyngeal erythema present.  Eyes:     General: No scleral icterus.       Right eye: No discharge.        Left eye: No discharge.     Extraocular Movements: Extraocular movements intact.     Conjunctiva/sclera: Conjunctivae normal.     Pupils: Pupils are equal, round, and reactive to light.  Cardiovascular:     Rate and Rhythm: Normal rate and regular rhythm.  Pulmonary:     Effort: Pulmonary effort is normal. No respiratory distress.     Breath sounds: Wheezing and rhonchi present. No rales.  Musculoskeletal:     Cervical back: Neck supple.  Lymphadenopathy:     Cervical: Cervical adenopathy present.  Skin:    Findings: No rash.  Neurological:     Mental Status: She is alert and oriented to person, place, and time. Mental status is at baseline.     Motor: No weakness.     Coordination: Coordination normal.     Gait: Gait normal.  Psychiatric:        Mood and Affect: Mood normal.        Behavior: Behavior normal.        Thought Content: Thought content normal.        Judgment: Judgment normal.      No results found. No results found. No results found for this or any previous visit (from the past 24 hour(s)).  Assessment/Plan: LAVAYAH VITA is a 77 y.o. female present for OV for  Bronchial pneumonia/acute cough-wheezing Rest, hydrate.  OTC: mucinex (DM if cough), nettie pot or nasal saline.  prescribed: Doxy twice daily, prednisone, Flonase, Tessalon Perles If feeling dizzy, would advise holding blood pressure medication  until taking in adequate nutrition/fluids Depo-Medrol 80 mg IM DuoNeb given today.  Air movement greatly improved. F/U 2 weeks of not improved.    Reviewed expectations re: course of current medical issues. Discussed self-management of symptoms. Outlined signs and symptoms indicating need for more acute intervention. Patient verbalized understanding and all questions were answered. Patient received an After-Visit Summary.    No orders of the defined types were placed in this encounter.  Meds ordered this encounter  Medications   predniSONE (DELTASONE) 20 MG tablet    Sig: 60 mg x2d, 40 mg x3d, 20 mg x2d, 10 mg x2d    Dispense:  15 tablet    Refill:  0   benzonatate (TESSALON) 200 MG capsule    Sig: Take 1 capsule (200 mg total) by mouth 2 (two) times daily as needed for cough.    Dispense:  20 capsule    Refill:  0   doxycycline (VIBRA-TABS) 100 MG tablet    Sig: Take 1 tablet (100 mg total) by mouth 2 (two) times daily.    Dispense:  20 tablet    Refill:  0   methylPREDNISolone acetate (DEPO-MEDROL) injection 80 mg   ipratropium-albuterol (DUONEB) 0.5-2.5 (3) MG/3ML nebulizer solution 3 mL   fluticasone (FLONASE) 50 MCG/ACT nasal spray    Sig: 1 spray each nostril twice daily    Dispense:  16 g  Refill:  6   Referral Orders  No referral(s) requested today     Note is dictated utilizing voice recognition software. Although note has been proof read prior to signing, occasional typographical errors still can be missed. If any questions arise, please do not hesitate to call for verification.   electronically signed by:  Howard Pouch, DO  [place]

## 2022-05-14 NOTE — Patient Instructions (Signed)
Do not take your blood pressure medicine (losartan) for 2 days. Start antibiotic today Start prednisone tomorrow  Acute Bronchitis, Adult  Acute bronchitis is when air tubes in the lungs (bronchi) suddenly get swollen. The condition can make it hard for you to breathe. In adults, acute bronchitis usually goes away within 2 weeks. A cough caused by bronchitis may last up to 3 weeks. Smoking, allergies, and asthma can make the condition worse. What are the causes? Germs that cause cold and flu (viruses). The most common cause of this condition is the virus that causes the common cold. Bacteria. Substances that bother (irritate) the lungs, including: Smoke from cigarettes and other types of tobacco. Dust and pollen. Fumes from chemicals, gases, or burned fuel. Indoor or outdoor air pollution. What increases the risk? A weak body's defense system. This is also called the immune system. Any condition that affects your lungs and breathing, such as asthma. What are the signs or symptoms? A cough. Coughing up clear, yellow, or green mucus. Making high-pitched whistling sounds when you breathe, most often when you breathe out (wheezing). Runny or stuffy nose. Having too much mucus in your lungs (chest congestion). Shortness of breath. Body aches. A sore throat. How is this treated? Acute bronchitis may go away over time without treatment. Your doctor may tell you to: Drink more fluids. This will help thin your mucus so it is easier to cough up. Use a device that gets medicine into your lungs (inhaler). Use a vaporizer or a humidifier. These are machines that add water to the air. This helps with coughing and poor breathing. Take a medicine that thins mucus and helps clear it from your lungs. Take a medicine that prevents or stops coughing. It is not common to take an antibiotic medicine for this condition. Follow these instructions at home:  Take over-the-counter and prescription  medicines only as told by your doctor. Use an inhaler, vaporizer, or humidifier as told by your doctor. Take two teaspoons (10 mL) of honey at bedtime. This helps lessen your coughing at night. Drink enough fluid to keep your pee (urine) pale yellow. Do not smoke or use any products that contain nicotine or tobacco. If you need help quitting, ask your doctor. Get a lot of rest. Return to your normal activities when your doctor says that it is safe. Keep all follow-up visits. How is this prevented?  Wash your hands often with soap and water for at least 20 seconds. If you cannot use soap and water, use hand sanitizer. Avoid contact with people who have cold symptoms. Try not to touch your mouth, nose, or eyes with your hands. Avoid breathing in smoke or chemical fumes. Make sure to get the flu shot every year. Contact a doctor if: Your symptoms do not get better in 2 weeks. You have trouble coughing up the mucus. Your cough keeps you awake at night. You have a fever. Get help right away if: You cough up blood. You have chest pain. You have very bad shortness of breath. You faint or keep feeling like you are going to faint. You have a very bad headache. Your fever or chills get worse. These symptoms may be an emergency. Get help right away. Call your local emergency services (911 in the [place] Do not wait to see if the symptoms will go away. Do not drive yourself to the hospital. Summary Acute bronchitis is when air tubes in the lungs (bronchi) suddenly get swollen. In adults, acute bronchitis usually  goes away within 2 weeks. Drink more fluids. This will help thin your mucus so it is easier to cough up. Take over-the-counter and prescription medicines only as told by your doctor. Contact a doctor if your symptoms do not improve after 2 weeks of treatment. This information is not intended to replace advice given to you by your health care provider. Make sure you discuss any questions  you have with your health care provider. Document Revised: 08/21/2020 Document Reviewed: 08/21/2020 Elsevier Patient Education  [place].

## 2022-05-19 DIAGNOSIS — Z4789 Encounter for other orthopedic aftercare: Secondary | ICD-10-CM | POA: Diagnosis not present

## 2022-05-19 DIAGNOSIS — M79645 Pain in left finger(s): Secondary | ICD-10-CM | POA: Diagnosis not present

## 2022-07-07 NOTE — Progress Notes (Unsigned)
Office Visit Note  Patient: [name]             Date of Birth: 11-08-1945           MRN: [id number]             PCP: Ma Hillock, DO Referring: Ma Hillock, DO Visit Date: 07/21/2022 Occupation: @GUAROCC @  Subjective:  Pain in multiple joints  History of Present Illness: [name] is a 77 y.o. female with history of seronegative rheumatoid arthritis and osteoarthritis.  Patient is currently taking Methotrexate 7 tablets once weekly and folic acid 2 mg by mouth daily.  She is tolerating methotrexate without any side effects and has not missed any doses recently.  Patient reports that she has been experiencing increased pain involving multiple joints.  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  She states that she thinks she may have injured her right shoulder when helping to lift him in the past.  She is scheduled for an MRI of the right shoulder on 07/25/2022 ordered by Dr. Roel Cluck for further evaluation.  She states that about 2 weeks ago she had a right trochanteric bursa injection performed by Dr. Rhona Raider which has been helpful but she continues to have persistent pain in both knee replacements especially her right knee.  She states that she had updated x-rays at that time which did not reveal any acute abnormality.  She has been referred to physical therapy.  She plans on taking Tylenol as needed for pain relief.  She denies any joint swelling secondary to rheumatoid arthritis.     Activities of Daily Living:  Patient reports morning stiffness for 2 hours.   Patient Reports nocturnal pain.  Difficulty dressing/grooming: Denies Difficulty climbing stairs: Reports Difficulty getting out of chair: Reports Difficulty using hands for taps, buttons, cutlery, and/or writing: Denies  Review of Systems  Constitutional:  Positive for fatigue.  HENT:  Negative for mouth sores and mouth dryness.    Eyes:  Negative for dryness.  Respiratory:  Negative for shortness of breath.   Cardiovascular:  Negative for chest pain and palpitations.  Gastrointestinal:  Negative for blood in stool, constipation and diarrhea.  Endocrine: Negative for increased urination.  Genitourinary:  Negative for involuntary urination.  Musculoskeletal:  Positive for joint pain, gait problem, joint pain, joint swelling, myalgias, muscle weakness, morning stiffness, muscle tenderness and myalgias.  Skin:  Negative for color change, rash, hair loss and sensitivity to sunlight.  Allergic/Immunologic: Negative for susceptible to infections.  Neurological:  Positive for dizziness. Negative for headaches.  Hematological:  Negative for swollen glands.  Psychiatric/Behavioral:  Positive for sleep disturbance. Negative for depressed mood. The patient is nervous/anxious.     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   Hypermetropia of both eyes 04/22/2018   Hyperopia with presbyopia 04/22/2018   Osteopenia 02/14/2016   Benign hypertension 02/16/2012   Occasional cigarette smoker 04/07/2010   Hyperlipidemia associated with type 2 diabetes mellitus ([place]) 03/18/2010    Past Medical History:  Diagnosis Date   Allergy    Arthritis  Bursitis of right hip    Chicken pox    Colon polyp    COPD (chronic obstructive pulmonary disease) (HCC)    CVA (cerebral infarction)    Diabetes mellitus without complication ([place])    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   Diverticulitis    Family history of colonic polyps 05/16/2020   History of blood transfusion    Hyperlipidemia     Insomnia    Internal hemorrhoids 05/16/2020   Nasal polyps    Obesity    PONV (postoperative nausea and vomiting)    Radial styloid tenosynovitis of left hand 12/21/2019   Rectal bleeding 05/16/2020   Sinusitis, chronic 03/18/2010   Qualifier: Diagnosis of  By: Johnnye Sima MD, Dellis Filbert      Family History  Problem Relation Age of Onset   Arthritis Mother    Diabetes Mother        Type I   Aneurysm Father    Stroke Father    Arthritis Sister    Arthritis Sister    Down syndrome Son    Diabetes Son        type I   Past Surgical History:  Procedure Laterality Date   ABDOMINAL HYSTERECTOMY  2000   APPENDECTOMY     CARDIAC CATHETERIZATION N/A 03/27/2015   Procedure: Left Heart Cath and Coronary Angiography;  Surgeon: Adrian Prows, MD;  Location: [facility];  Service: Cardiovascular;  Laterality: N/A;   CESAREAN SECTION     ELBOW SURGERY Right    FIXATION KYPHOPLASTY LUMBAR SPINE  10/16/2020   L2 and L3 kyphoplasty   HAND TENDON SURGERY Left 02/04/2022   KNEE ARTHROPLASTY     KYPHOPLASTY N/A 10/16/2020   Procedure: LUMBAR 2 AND LUMBAR 3 KYPHOPLASTY;  Surgeon: Phylliss Bob, MD;  Location: [facility];  Service: Orthopedics;  Laterality: N/A;   NASAL SINUS SURGERY     REPLACEMENT TOTAL KNEE Bilateral 2007 2009   TONSILLECTOMY AND ADENOIDECTOMY     WISDOM TOOTH EXTRACTION     Social History   Social History Narrative   Marital status/children/pets: Married, 1 child.    Education/employment: Secretary/administrator educated, retired.   Safety:      -smoke alarm in the home:Yes     - wears seatbelt: Yes     - Feels safe in their relationships: Yes   Immunization History  Administered Date(s) Administered   COVID-19, mRNA, vaccine(Comirnaty)12 years and older 03/27/2022   Fluad Quad(high Dose 65+) 01/11/2019, 02/15/2020, 03/06/2022   Influenza Whole 04/21/2010   Influenza, High Dose Seasonal PF 02/20/2013, 02/07/2018, 01/30/2021   Influenza,inj,Quad PF,6+ Mos 02/23/2017    Influenza-Unspecified 02/15/2014, 01/30/2016, 02/07/2018, 01/11/2019   PFIZER Comirnaty(Gray Top)Covid-19 Tri-Sucrose Vaccine 10/09/2020   PFIZER(Purple Top)SARS-COV-2 Vaccination 06/08/2019, 07/03/2019, 01/04/2020   PNEUMOCOCCAL CONJUGATE-20 05/13/2021   Pfizer Covid-19 Vaccine Bivalent Booster 61yrs & up 03/24/2021   Pneumococcal Conjugate-13 02/20/2013   Pneumococcal Polysaccharide-23 04/05/2006   Pneumococcal-Unspecified 03/05/2015   Tdap 09/14/2001, 03/05/2015   Tetanus 03/27/2014   Zoster Recombinat (Shingrix) 10/12/2017, 02/07/2018   Zoster, Live 12/07/2013     Objective: Vital Signs: BP 120/75 (BP Location: Left Arm, Patient Position: Sitting, Cuff Size: Large)   Pulse 85   Resp 14   Ht 5\' 4"  (1.626 m)   Wt 199 lb (90.3 kg)   BMI 34.16 kg/m    Physical Exam Vitals and nursing note reviewed.  Constitutional:      Appearance: She is well-developed.  HENT:     Head: Normocephalic and atraumatic.  Eyes:  Conjunctiva/sclera: Conjunctivae normal.  Cardiovascular:     Rate and Rhythm: Normal rate and regular rhythm.     Heart sounds: Normal heart sounds.  Pulmonary:     Effort: Pulmonary effort is normal.     Breath sounds: Normal breath sounds.  Abdominal:     General: Bowel sounds are normal.     Palpations: Abdomen is soft.  Musculoskeletal:     Cervical back: Normal range of motion.  Lymphadenopathy:     Cervical: No cervical adenopathy.  Skin:    General: Skin is warm and dry.     Capillary Refill: Capillary refill takes less than 2 seconds.  Neurological:     Mental Status: She is alert and oriented to person, place, and time.  Psychiatric:        Behavior: Behavior normal.      Musculoskeletal Exam: C-spine has good range of motion.  Right shoulder has limited abduction to about 90 degrees.  Left shoulder has full range of motion with no discomfort.  Elbow joints, wrist joints, MCPs, PIPs, DIPs have good range of motion with no synovitis.  Limited  extension of the left thumb.  PIP and DIP thickening consistent with osteoarthritis of both hands.  Complete fist formation bilaterally.  No tenderness or synovitis over MCP joints.  Hip joints have good range of motion with no groin pain.  Tenderness over the right trochanteric bursa.  Bilateral knee replacements have good range of motion with discomfort in the right knee.  Ankle joints have good range of motion with no tenderness or synovitis.  CDAI Exam: CDAI Score: -- Patient Global: 3 mm; Provider Global: 3 mm Swollen: --; Tender: -- Joint Exam 07/21/2022   No joint exam has been documented for this visit   There is currently no information documented on the homunculus. Go to the Rheumatology activity and complete the homunculus joint exam.  Investigation: No additional findings.  Imaging: No results found.  Recent Labs: Lab Results  Component Value Date   WBC 9.6 07/13/2022   HGB 14.4 07/13/2022   PLT 396 07/13/2022   NA 140 07/13/2022   K 4.4 07/13/2022   CL 102 07/13/2022   CO2 29 07/13/2022   GLUCOSE 71 07/13/2022   BUN 24 07/13/2022   CREATININE 0.89 07/13/2022   BILITOT 0.4 07/13/2022   ALKPHOS 37 (L) 10/29/2021   AST 15 07/13/2022   ALT 15 07/13/2022   PROT 7.6 07/13/2022   ALBUMIN 4.3 10/29/2021   CALCIUM 9.4 07/13/2022   GFRAA 73 09/05/2020   QFTBGOLDPLUS NEGATIVE 06/28/2019    Speciality Comments: No specialty comments available.  Procedures:  No procedures performed Allergies: Lipitor [atorvastatin calcium]   Assessment / Plan:     Visit Diagnoses: Rheumatoid arthritis of multiple sites with negative rheumatoid factor ([place]): She has no synovitis on examination today.  She has not had any signs or symptoms of a rheumatoid arthritis flare.  She has clinically been doing well taking methotrexate 7 tablets by mouth once weekly along with folic acid 2 mg daily.  She is tolerating methotrexate without any side effects.  No medication changes will be made at  this time.  A refill of methotrexate was sent to the pharmacy.  She was advised to notify us if she develops signs or symptoms of a rheumatoid arthritis flare.  She will follow up in the office in 3 months or sooner if needed.   High risk medication use - Methotrexate 7 tablets once weekly and folic acid  2 mg by mouth daily. CBC and CMP WNL on 07/13/22. Her next lab work will be due in June and every 3 months.   Discussed the importance of holding methotrexate if she develops signs or symptoms of an infection and to resume once the infection has completely cleared.   Primary osteoarthritis of both hands: She has PIP and DIP thickening consistent with osteoarthritis of both hands.  No active inflammation noted on examination.  Discussed the importance of joint protection and muscle strengthening.  Status post total bilateral knee replacement - Dr. Simmie Davies pain.  Evaluated by Dr. Rhona Raider about 2 weeks ago.  At that time she had updated x-rays of both knees which did not reveal any acute abnormalities.  Dr. Rhona Raider recommended a referral to physical therapy for lower extremity muscle strengthening.  She has only had 1 session thus far.  Primary osteoarthritis of both feet: She is not experiencing any discomfort in her feet at this time.  Synovial thickening of the left ankle was noted but no active inflammation.  She is wearing proper fitting shoes.  Osteopenia of multiple sites - Previous DEXA on 12/14/17: The BMD measured at Femur Neck Right is 0.971 g/cm2 with a T-score of -0.5. Kyphoplasty for L2 and L3 on 10/16/2020 performed by Dr. Lynann Bologna. DEXA updated on The BMD measured at Femur Neck Right is 0.883 g/cm2 with a T-score of -1.1.  She is taking vitamin D 1000 units daily.   Vitamin D deficiency: She is taking vitamin D 1000 units daily.    Trochanteric bursitis, right hip: Under the care of Dr. Rhona Raider.  Patient had a right trochanteric bursa cortisone injection about 2 weeks ago  which has alleviated most of her discomfort.  She is continues to have some tenderness over the right trochanteric bursa on examination today.  She is a side sleeper which seems to exacerbate her symptoms.  She has been referred to physical therapy which will hopefully be helpful at alleviating her symptoms.  Chronic right shoulder pain: Under the care of Dr. Rhona Raider.  Patient thinks that she previously injured her right shoulder while acting as the primary caregiver for her husband prior to his passing.  She has painful limited range of motion of the right shoulder on examination today.  Scheduled for an MRI of the right shoulder on 07/25/2022.  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  Meds ordered this encounter  Medications   methotrexate (RHEUMATREX) 2.5 MG tablet    Sig: TAKE 7 TABLETS (17.5 MG TOTAL) BY MOUTH ONCE A WEEK.    Dispense:  84 tablet    Refill:  0    Follow-Up Instructions: Return in about 5 months (around 12/21/2022) for Rheumatoid arthritis, Osteoarthritis.   Ofilia Neas, PA-C  Note - This record has been created using Dragon software.  Chart creation errors have been sought, but may not always  have been located. Such creation errors do not reflect on  the standard of medical care.

## 2022-07-10 DIAGNOSIS — M7061 Trochanteric bursitis, right hip: Secondary | ICD-10-CM | POA: Diagnosis not present

## 2022-07-10 DIAGNOSIS — M25511 Pain in right shoulder: Secondary | ICD-10-CM | POA: Diagnosis not present

## 2022-07-10 DIAGNOSIS — M25561 Pain in right knee: Secondary | ICD-10-CM | POA: Diagnosis not present

## 2022-07-13 ENCOUNTER — Other Ambulatory Visit: Payer: Self-pay | Admitting: *Deleted

## 2022-07-13 DIAGNOSIS — Z79899 Other long term (current) drug therapy: Secondary | ICD-10-CM

## 2022-07-13 DIAGNOSIS — M0609 Rheumatoid arthritis without rheumatoid factor, multiple sites: Secondary | ICD-10-CM

## 2022-07-14 LAB — COMPLETE METABOLIC PANEL WITH GFR
AG Ratio: 1.4 (calc) (ref 1.0–2.5)
ALT: 15 U/L (ref 6–29)
AST: 15 U/L (ref 10–35)
Albumin: 4.4 g/dL (ref 3.6–5.1)
Alkaline phosphatase (APISO): 37 U/L (ref 37–153)
BUN: 24 mg/dL (ref 7–25)
CO2: 29 mmol/L (ref 20–32)
Calcium: 9.4 mg/dL (ref 8.6–10.4)
Chloride: 102 mmol/L (ref 98–110)
Creat: 0.89 mg/dL (ref 0.60–1.00)
Globulin: 3.2 g/dL (calc) (ref 1.9–3.7)
Glucose, Bld: 71 mg/dL (ref 65–99)
Potassium: 4.4 mmol/L (ref 3.5–5.3)
Sodium: 140 mmol/L (ref 135–146)
Total Bilirubin: 0.4 mg/dL (ref 0.2–1.2)
Total Protein: 7.6 g/dL (ref 6.1–8.1)
eGFR: 68 mL/min/{1.73_m2} (ref >=60)

## 2022-07-14 LAB — CBC WITH DIFFERENTIAL/PLATELET
Absolute Monocytes: 768 cells/uL (ref 200–950)
Basophils Absolute: 38 cells/uL (ref 0–200)
Basophils Relative: 0.4 %
Eosinophils Absolute: 96 cells/uL (ref 15–500)
Eosinophils Relative: 1 %
HCT: 42.2 % (ref 35.0–45.0)
Hemoglobin: 14.4 g/dL (ref 11.7–15.5)
Lymphs Abs: 2438 cells/uL (ref 850–3900)
MCH: 30.3 pg (ref 27.0–33.0)
MCHC: 34.1 g/dL (ref 32.0–36.0)
MCV: 88.8 fL (ref 80.0–100.0)
MPV: 9.5 fL (ref 7.5–12.5)
Monocytes Relative: 8 %
Neutro Abs: 6259 cells/uL (ref 1500–7800)
Neutrophils Relative %: 65.2 %
Platelets: 396 10*3/uL (ref 140–400)
RBC: 4.75 10*6/uL (ref 3.80–5.10)
RDW: 13.8 % (ref 11.0–15.0)
Total Lymphocyte: 25.4 %
WBC: 9.6 10*3/uL (ref 3.8–10.8)

## 2022-07-14 NOTE — Progress Notes (Signed)
CBC and CMP are normal.

## 2022-07-17 ENCOUNTER — Telehealth (HOSPITAL_BASED_OUTPATIENT_CLINIC_OR_DEPARTMENT_OTHER): Payer: Self-pay

## 2022-07-17 ENCOUNTER — Other Ambulatory Visit (HOSPITAL_BASED_OUTPATIENT_CLINIC_OR_DEPARTMENT_OTHER): Payer: Self-pay | Admitting: Orthopaedic Surgery

## 2022-07-17 DIAGNOSIS — M25511 Pain in right shoulder: Secondary | ICD-10-CM

## 2022-07-20 DIAGNOSIS — M25561 Pain in right knee: Secondary | ICD-10-CM | POA: Diagnosis not present

## 2022-07-20 DIAGNOSIS — M7061 Trochanteric bursitis, right hip: Secondary | ICD-10-CM | POA: Diagnosis not present

## 2022-07-21 ENCOUNTER — Ambulatory Visit: Payer: PPO | Attending: Rheumatology | Admitting: Physician Assistant

## 2022-07-21 ENCOUNTER — Encounter: Payer: Self-pay | Admitting: Physician Assistant

## 2022-07-21 VITALS — BP 120/75 | HR 85 | Resp 14 | Ht 64.0 in | Wt 199.0 lb

## 2022-07-21 DIAGNOSIS — M8589 Other specified disorders of bone density and structure, multiple sites: Secondary | ICD-10-CM

## 2022-07-21 DIAGNOSIS — M0609 Rheumatoid arthritis without rheumatoid factor, multiple sites: Secondary | ICD-10-CM | POA: Diagnosis not present

## 2022-07-21 DIAGNOSIS — Z8709 Personal history of other diseases of the respiratory system: Secondary | ICD-10-CM | POA: Diagnosis not present

## 2022-07-21 DIAGNOSIS — T466X5A Adverse effect of antihyperlipidemic and antiarteriosclerotic drugs, initial encounter: Secondary | ICD-10-CM

## 2022-07-21 DIAGNOSIS — E1136 Type 2 diabetes mellitus with diabetic cataract: Secondary | ICD-10-CM

## 2022-07-21 DIAGNOSIS — M19042 Primary osteoarthritis, left hand: Secondary | ICD-10-CM

## 2022-07-21 DIAGNOSIS — E1159 Type 2 diabetes mellitus with other circulatory complications: Secondary | ICD-10-CM | POA: Diagnosis not present

## 2022-07-21 DIAGNOSIS — Z79899 Other long term (current) drug therapy: Secondary | ICD-10-CM

## 2022-07-21 DIAGNOSIS — G8929 Other chronic pain: Secondary | ICD-10-CM

## 2022-07-21 DIAGNOSIS — E118 Type 2 diabetes mellitus with unspecified complications: Secondary | ICD-10-CM | POA: Diagnosis not present

## 2022-07-21 DIAGNOSIS — M19072 Primary osteoarthritis, left ankle and foot: Secondary | ICD-10-CM

## 2022-07-21 DIAGNOSIS — Z789 Other specified health status: Secondary | ICD-10-CM

## 2022-07-21 DIAGNOSIS — M19041 Primary osteoarthritis, right hand: Secondary | ICD-10-CM

## 2022-07-21 DIAGNOSIS — E785 Hyperlipidemia, unspecified: Secondary | ICD-10-CM

## 2022-07-21 DIAGNOSIS — M7061 Trochanteric bursitis, right hip: Secondary | ICD-10-CM

## 2022-07-21 DIAGNOSIS — Z96653 Presence of artificial knee joint, bilateral: Secondary | ICD-10-CM | POA: Diagnosis not present

## 2022-07-21 DIAGNOSIS — E559 Vitamin D deficiency, unspecified: Secondary | ICD-10-CM

## 2022-07-21 DIAGNOSIS — R5383 Other fatigue: Secondary | ICD-10-CM

## 2022-07-21 DIAGNOSIS — M791 Myalgia, unspecified site: Secondary | ICD-10-CM

## 2022-07-21 DIAGNOSIS — H47013 Ischemic optic neuropathy, bilateral: Secondary | ICD-10-CM

## 2022-07-21 DIAGNOSIS — M19071 Primary osteoarthritis, right ankle and foot: Secondary | ICD-10-CM

## 2022-07-21 DIAGNOSIS — E1169 Type 2 diabetes mellitus with other specified complication: Secondary | ICD-10-CM | POA: Diagnosis not present

## 2022-07-21 DIAGNOSIS — M25511 Pain in right shoulder: Secondary | ICD-10-CM

## 2022-07-21 DIAGNOSIS — I152 Hypertension secondary to endocrine disorders: Secondary | ICD-10-CM

## 2022-07-21 DIAGNOSIS — Z720 Tobacco use: Secondary | ICD-10-CM

## 2022-07-21 MED ORDER — METHOTREXATE SODIUM 2.5 MG PO TABS: ORAL_TABLET | ORAL | 0 refills | Status: DC

## 2022-07-21 NOTE — Patient Instructions (Addendum)
Standing Labs We placed an order today for your standing lab work.   Please have your standing labs drawn in June and every 3 months  Please have your labs drawn 2 weeks prior to your appointment so that the provider can discuss your lab results at your appointment, if possible.  Please note that you may see your imaging and lab results in MyChart before we have reviewed them. We will contact you once all results are reviewed. Please allow our office up to 72 hours to thoroughly review all of the results before contacting the office for clarification of your results.  WALK-IN LAB HOURS  Monday through Thursday from 8:00 am -12:30 pm and 1:00 pm-5:00 pm and Friday from 8:00 am-12:00 pm.  Patients with office visits requiring labs will be seen before walk-in labs.  You may encounter longer than normal wait times. Please allow additional time. Wait times may be shorter on  Monday and Thursday afternoons.  We do not book appointments for walk-in labs. We appreciate your patience and understanding with our staff.   Labs are drawn by [facility]. Please bring your co-pay at the time of your lab draw.  You may receive a bill from [facility] for your lab work.  Please note if you are on Hydroxychloroquine and and an order has been placed for a Hydroxychloroquine level,  you will need to have it drawn 4 hours or more after your last dose.  If you wish to have your labs drawn at another location, please call the office 24 hours in advance so we can fax the orders.  The office is located at [address], [address], [place], [place] [address]   If you have any questions regarding directions or hours of operation,  please call [phone].   As a reminder, please drink plenty of water prior to coming for your lab work. Thanks!  If you have signs or symptoms of an infection or start antibiotics: First, call your PCP for workup of your infection. Hold your medication through the infection, until you  complete your antibiotics, and until symptoms resolve if you take the following: Injectable medication (Actemra, Benlysta, Cimzia, Cosentyx, Enbrel, Humira, Kevzara, Orencia, Remicade, Simponi, Stelara, Taltz, Tremfya) Methotrexate Leflunomide (Arava) Mycophenolate (Cellcept) Xeljanz, Olumiant, or Rinvoq Vaccines You are taking a medication(s) that can suppress your immune system.  The following immunizations are recommended: Flu annually Covid-19  Td/Tdap (tetanus, diphtheria, pertussis) every 10 years Pneumonia (Prevnar 15 then Pneumovax 23 at least 1 year apart.  Alternatively, can take Prevnar 20 without needing additional dose) Shingrix: 2 doses from 4 weeks to 6 months apart  Please check with your PCP to make sure you are up to date.   

## 2022-07-25 ENCOUNTER — Ambulatory Visit (HOSPITAL_BASED_OUTPATIENT_CLINIC_OR_DEPARTMENT_OTHER)
Admission: RE | Admit: 2022-07-25 | Discharge: 2022-07-25 | Disposition: A | Discharge status: Home / Self Care | Payer: PPO | Source: Ambulatory Visit | Attending: Orthopaedic Surgery | Admitting: Orthopaedic Surgery

## 2022-07-25 DIAGNOSIS — M25511 Pain in right shoulder: Secondary | ICD-10-CM | POA: Diagnosis not present

## 2022-08-07 DIAGNOSIS — M25511 Pain in right shoulder: Secondary | ICD-10-CM | POA: Diagnosis not present

## 2022-08-07 DIAGNOSIS — M25561 Pain in right knee: Secondary | ICD-10-CM | POA: Diagnosis not present

## 2022-08-18 ENCOUNTER — Telehealth: Payer: Self-pay | Admitting: Family Medicine

## 2022-08-18 NOTE — Telephone Encounter (Signed)
Copied from CRM [id number]. Topic: Medicare AWV >> Aug 18, 2022 12:30 PM Gwenith Spitz wrote: Reason for CRM: Called patient to schedule Medicare Annual Wellness Visit (AWV). Left message for patient to call back and schedule Medicare Annual Wellness Visit (AWV).  Last date of AWV: 07/30/2021  Please schedule an appointment at any time with Inetta Fermo, [facility]. Please schedule AWVS with Inetta Fermo [facility].  Schedule for Wednesdays only, Tele/Video visit only.  If any questions, please contact me at [phone].  Thank you ,  Gabriel Cirri [facility] AWV TEAM Direct Dial [phone]

## 2022-08-28 LAB — HM DIABETES EYE EXAM

## 2022-09-09 ENCOUNTER — Ambulatory Visit (INDEPENDENT_AMBULATORY_CARE_PROVIDER_SITE_OTHER): Payer: PPO

## 2022-09-09 VITALS — Wt 199.0 lb

## 2022-09-09 DIAGNOSIS — Z0000 Encounter for general adult medical examination without abnormal findings: Secondary | ICD-10-CM | POA: Diagnosis not present

## 2022-09-09 NOTE — Progress Notes (Signed)
I connected with  [name] on 09/09/22 by a audio enabled telemedicine application and verified that I am speaking with the correct person using two identifiers.  Patient Location: Home  Provider Location: Office/Clinic  I discussed the limitations of evaluation and management by telemedicine. The patient expressed understanding and agreed to proceed.   Subjective:   [name] is a 77 y.o. female who presents for Medicare Annual (Subsequent) preventive examination.  Review of Systems     Cardiac Risk Factors include: advanced age (>60men, >50 women);obesity (BMI >30kg/m2);diabetes mellitus;dyslipidemia;hypertension;smoking/ tobacco exposure     Objective:    Today's Vitals   09/09/22 1036  Weight: 199 lb (90.3 kg)   Body mass index is 34.16 kg/m.     09/09/2022   10:41 AM 07/30/2021   11:08 AM 02/21/2020    8:20 AM 10/18/2018   10:13 AM 08/05/2015   10:11 AM 03/27/2015    4:25 PM 10/19/2014    2:50 PM  Advanced Directives  Does Patient Have a Medical Advance Directive? Yes Yes Yes Yes Yes Yes No  Type of Estate agent of [place];Living will Healthcare Power of [employer] of [place];Living will Healthcare Power of [place];Living will Healthcare Power of [place];Living will Living will;Healthcare Power of Attorney   Does patient want to make changes to medical advance directive? No - Patient declined   No - Patient declined     Copy of Healthcare Power of Attorney in Chart? Yes - validated most recent copy scanned in chart (See row information) Yes - validated most recent copy scanned in chart (See row information) Yes - validated most recent copy scanned in chart (See row information) Yes - validated most recent copy scanned in chart (See row information) No - copy requested      Current Medications (verified) Outpatient Encounter Medications as of 09/09/2022  Medication Sig   aspirin EC 81 MG tablet Take 81 mg by mouth daily.  Swallow whole.   cholecalciferol (VITAMIN D3) 25 MCG (1000 UNIT) tablet Take 1,000 Units by mouth daily.   fenofibrate 160 MG tablet Take 1 tablet (160 mg total) by mouth daily.   fluticasone (FLONASE) 50 MCG/ACT nasal spray 1 spray each nostril twice daily   folic acid (FOLVITE) 1 MG tablet TAKE 2 TABLETS BY MOUTH EVERY DAY   losartan (COZAAR) 25 MG tablet Take 0.5 tablets (12.5 mg total) by mouth daily.   metFORMIN (GLUCOPHAGE) 500 MG tablet Take 1 tablet (500 mg total) by mouth 2 (two) times daily.   methotrexate (RHEUMATREX) 2.5 MG tablet TAKE 7 TABLETS (17.5 MG TOTAL) BY MOUTH ONCE A WEEK.   Omega-3 Fatty Acids (FISH OIL PO) Take by mouth daily.   pravastatin (PRAVACHOL) 20 MG tablet TAKE 1 TABLET BY MOUTH EVERY DAY   sitaGLIPtin (JANUVIA) 50 MG tablet Take 1 tablet (50 mg total) by mouth daily.   zolpidem (AMBIEN) 5 MG tablet Take 1 tablet (5 mg total) by mouth at bedtime as needed for sleep.   No facility-administered encounter medications on file as of 09/09/2022.    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  Diverticulitis    Family history of colonic polyps 05/16/2020   History of blood transfusion    Hyperlipidemia    Insomnia    Internal hemorrhoids 05/16/2020   Nasal polyps    Obesity    PONV (postoperative nausea and vomiting)    Radial styloid tenosynovitis of left hand 12/21/2019   Rectal bleeding 05/16/2020   Sinusitis, chronic 03/18/2010   Qualifier: Diagnosis of  By: Ninetta Lights MD, Tinnie Gens     Past Surgical History:  Procedure Laterality Date   ABDOMINAL HYSTERECTOMY  2000   APPENDECTOMY     CARDIAC CATHETERIZATION N/A 03/27/2015   Procedure:  Left Heart Cath and Coronary Angiography;  Surgeon: Yates Decamp, MD;  Location: [facility] INVASIVE CV LAB;  Service: Cardiovascular;  Laterality: N/A;   CESAREAN SECTION     ELBOW SURGERY Right    FIXATION KYPHOPLASTY LUMBAR SPINE  10/16/2020   L2 and L3 kyphoplasty   HAND TENDON SURGERY Left 02/04/2022   KNEE ARTHROPLASTY     KYPHOPLASTY N/A 10/16/2020   Procedure: LUMBAR 2 AND LUMBAR 3 KYPHOPLASTY;  Surgeon: Estill Bamberg, MD;  Location: [facility] OR;  Service: Orthopedics;  Laterality: N/A;   NASAL SINUS SURGERY     REPLACEMENT TOTAL KNEE Bilateral 2007 2009   TONSILLECTOMY AND ADENOIDECTOMY     WISDOM TOOTH EXTRACTION     Family History  Problem Relation Age of Onset   Arthritis Mother    Diabetes Mother        Type I   Aneurysm Father    Stroke Father    Arthritis Sister    Arthritis Sister    Down syndrome Son    Diabetes Son        type I   Social History   Socioeconomic History   Marital status: Widowed    Spouse name: Not on file   Number of children: Not on file   Years of education: Not on file   Highest education level: Not on file  Occupational History   Not on file  Tobacco Use   Smoking status: Every Day    Packs/day: 0.50    Years: 35.00    Additional pack years: 0.00    Total pack years: 17.50    Types: Cigarettes    Passive exposure: Never   Smokeless tobacco: Never   Tobacco comments:    6 cigarettes per day   Vaping Use   Vaping Use: Never used  Substance and Sexual Activity   Alcohol use: No    Alcohol/week: 0.0 standard drinks of alcohol   Drug use: No   Sexual activity: Not Currently  Other Topics Concern   Not on file  Social History Narrative   Marital status/children/pets: Married, 1 child.    Education/employment: Automotive engineer educated, retired.   Safety:      -smoke alarm in the home:Yes     - wears seatbelt: Yes     - Feels safe in their relationships: Yes   Social Determinants of Health   Financial Resource Strain: Low Risk  (09/09/2022)    Overall Financial Resource Strain (CARDIA)    Difficulty of Paying Living Expenses: Not hard at all  Food Insecurity: No Food Insecurity (09/09/2022)   Hunger Vital Sign    Worried About Running Out of Food in the Last Year: Never true    Ran Out of Food in the Last Year: Never true  Transportation Needs: No Transportation Needs (09/09/2022)   PRAPARE - Transportation    Lack of Transportation (  Medical): No    Lack of Transportation (Non-Medical): No  Physical Activity: Sufficiently Active (09/09/2022)   Exercise Vital Sign    Days of Exercise per Week: 2 days    Minutes of Exercise per Session: 150+ min  Stress: No Stress Concern Present (09/09/2022)   [employer] of Occupational Health - Occupational Stress Questionnaire    Feeling of Stress : Not at all  Social Connections: Socially Isolated (09/09/2022)   Social Connection and Isolation Panel [NHANES]    Frequency of Communication with Friends and Family: More than three times a week    Frequency of Social Gatherings with Friends and Family: More than three times a week    Attends Religious Services: Never    Database administrator or Organizations: No    Attends Banker Meetings: Never    Marital Status: Widowed    Tobacco Counseling Ready to quit: Not Answered Counseling given: Not Answered Tobacco comments: 6 cigarettes per day    Clinical Intake:  Pre-visit preparation completed: Yes  Pain : No/denies pain     BMI - recorded: 34.16 Nutritional Status: BMI > 30  Obese Nutritional Risks: None Diabetes: Yes CBG done?: Yes (99 per pt) CBG resulted in Enter/ Edit results?: No Did pt. bring in CBG monitor from home?: No  How often do you need to have someone help you when you read instructions, pamphlets, or other written materials from your doctor or pharmacy?: 1 - Never  Diabetic?Nutrition Risk Assessment:  Has the patient had any N/V/D within the last 2 months?  No  Does the patient have any  non-healing wounds?  No  Has the patient had any unintentional weight loss or weight gain?  No   Diabetes:  Is the patient diabetic?  Yes  If diabetic, was a CBG obtained today?  Yes  Did the patient bring in their glucometer from home?  No  How often do you monitor your CBG's? daily.   Financial Strains and Diabetes Management:  Are you having any financial strains with the device, your supplies or your medication? No .  Does the patient want to be seen by Chronic Care Management for management of their diabetes?  No  Would the patient like to be referred to a Nutritionist or for Diabetic Management?  No   Diabetic Exams:  Diabetic Eye Exam: Completed 08/28/22 Diabetic Foot Exam: Completed 01/30/22   Interpreter Needed?: No  Information entered by :: Lanier Ensign, LPN   Activities of Daily Living    09/09/2022   10:42 AM 05/09/2022    9:13 PM  In your present state of health, do you have any difficulty performing the following activities:  Hearing? 0 0  Vision? 0 0  Difficulty concentrating or making decisions? 0 0  Walking or climbing stairs? 0 0  Dressing or bathing? 0 0  Doing errands, shopping? 0 0  Preparing Food and eating ? N N  Using the Toilet? N N  In the past six months, have you accidently leaked urine? N N  Do you have problems with loss of bowel control? N N  Managing your Medications? N N  Managing your Finances? N N  Housekeeping or managing your Housekeeping? N N    Patient Care Team: Natalia Leatherwood, DO as PCP - General (Family Medicine) Pollyann Savoy, MD as Consulting Physician (Rheumatology) Charna Elizabeth, MD as Consulting Physician (Gastroenterology) Antionette Char Sharl Ma, OD (Optometry)  Indicate any recent Medical Services you may have received  from other than [facility] providers in the past year (date may be approximate).     Assessment:   This is a routine wellness examination for [place].  Hearing/Vision screen Hearing Screening -  Comments:: Pt denies any hearing issues  Vision Screening - Comments:: Pt follows up with Dr Antionette Char for annual eye exams   Dietary issues and exercise activities discussed: Current Exercise Habits: Home exercise routine, Type of exercise: Other - see comments (gardening), Time (Minutes): > 60, Frequency (Times/Week): 2, Weekly Exercise (Minutes/Week): 0   Goals Addressed             This Visit's Progress    Patient Stated       Lose weight        Depression Screen    09/09/2022   10:39 AM 04/15/2022    1:06 PM 07/30/2021   11:07 AM 02/21/2020    8:22 AM 02/15/2020    9:54 AM 10/26/2019    8:57 AM 10/18/2018    9:51 AM  PHQ 2/9 Scores  PHQ - 2 Score 0 0 0 0 0 0 0    Fall Risk    09/09/2022   10:42 AM 05/09/2022    9:13 PM 04/15/2022    1:06 PM 01/30/2022    1:33 PM 10/29/2021    8:54 AM  Fall Risk   Falls in the past year? 1 0 0 0 0  Number falls in past yr: 1 0 0  0  Injury with Fall? 0 0 0  0  Risk for fall due to : Impaired vision  No Fall Risks    Follow up Falls prevention discussed  Falls evaluation completed  Falls evaluation completed    FALL RISK PREVENTION PERTAINING TO THE HOME:  Any stairs in or around the home? Yes  If so, are there any without handrails? No  Home free of loose throw rugs in walkways, pet beds, electrical cords, etc? Yes  Adequate lighting in your home to reduce risk of falls? Yes   ASSISTIVE DEVICES UTILIZED TO PREVENT FALLS:  Life alert? No  Use of a cane, walker or w/c? No  Grab bars in the bathroom? Yes  Shower chair or bench in shower? Yes  Elevated toilet seat or a handicapped toilet? No   TIMED UP AND GO:  Was the test performed? No .   Cognitive Function:        09/09/2022   10:42 AM 07/30/2021   11:13 AM  6CIT Screen  What Year? 0 points 0 points  What month? 0 points 0 points  What time? 0 points 0 points  Count back from 20 0 points 0 points  Months in reverse 0 points 0 points  Repeat phrase 0 points 0 points   Total Score 0 points 0 points    Immunizations Immunization History  Administered Date(s) Administered   COVID-19, mRNA, vaccine(Comirnaty)12 years and older 03/27/2022   Fluad Quad(high Dose 65+) 01/11/2019, 02/15/2020, 03/06/2022   Influenza Whole 04/21/2010   Influenza, High Dose Seasonal PF 02/20/2013, 02/07/2018, 01/30/2021   Influenza,inj,Quad PF,6+ Mos 02/23/2017   Influenza-Unspecified 02/15/2014, 01/30/2016, 02/07/2018, 01/11/2019   PFIZER Comirnaty(Gray Top)Covid-19 Tri-Sucrose Vaccine 10/09/2020   PFIZER(Purple Top)SARS-COV-2 Vaccination 06/08/2019, 07/03/2019, 01/04/2020   PNEUMOCOCCAL CONJUGATE-20 05/13/2021   Pfizer Covid-19 Vaccine Bivalent Booster 84yrs & up 03/24/2021   Pneumococcal Conjugate-13 02/20/2013   Pneumococcal Polysaccharide-23 04/05/2006   Pneumococcal-Unspecified 03/05/2015   Tdap 09/14/2001, 03/05/2015   Tetanus 03/27/2014   Zoster Recombinat (Shingrix) 10/12/2017, 02/07/2018  Zoster, Live 12/07/2013    TDAP status: Up to date  Flu Vaccine status: Up to date  Pneumococcal vaccine status: Up to date  Covid-19 vaccine status: Completed vaccines  Qualifies for Shingles Vaccine? Yes   Zostavax completed Yes   Shingrix Completed?: Yes  Screening Tests Health Maintenance  Topic Date Due   HEMOGLOBIN A1C  10/15/2022   Diabetic kidney evaluation - Urine ACR  10/30/2022   INFLUENZA VACCINE  12/03/2022   FOOT EXAM  01/31/2023   Diabetic kidney evaluation - eGFR measurement  07/13/2023   OPHTHALMOLOGY EXAM  08/28/2023   Medicare Annual Wellness (AWV)  09/09/2023   MAMMOGRAM  04/15/2024   DTaP/Tdap/Td (4 - Td or Tdap) 03/04/2025   COLONOSCOPY (Pts 45-98yrs Insurance coverage will need to be confirmed)  03/26/2029   Pneumonia Vaccine 68+ Years old  Completed   DEXA SCAN  Completed   Hepatitis C Screening  Completed   Zoster Vaccines- Shingrix  Completed   HPV VACCINES  Aged Out   COVID-19 Vaccine  Discontinued    Health  Maintenance  There are no preventive care reminders to display for this patient.   Colorectal cancer screening: Type of screening: Colonoscopy. Completed 03/27/19. Repeat every 10 years  Mammogram status: Completed 04/15/22. Repeat every year  Bone Density status: Completed 04/15/22. Results reflect: Bone density results: OSTEOPENIA. Repeat every 2 years.  Lung Cancer Screening: (Low Dose CT Chest recommended if Age 77-80 years, 30 pack-year currently smoking OR have quit w/in 15years.) does qualify.   Lung Cancer Screening Referral: declined at this time   Additional Screening:  Hepatitis C Screening:  Completed 06/28/19  Vision Screening: Recommended annual ophthalmology exams for early detection of glaucoma and other disorders of the eye. Is the patient up to date with their annual eye exam?  Yes  Who is the provider or what is the name of the office in which the patient attends annual eye exams? Dr Antionette Char If pt is not established with a provider, would they like to be referred to a provider to establish care? No .   Dental Screening: Recommended annual dental exams for proper oral hygiene  Community Resource Referral / Chronic Care Management: CRR required this visit?  No   CCM required this visit?  No      Plan:     I have personally reviewed and noted the following in the patient's chart:   Medical and social history Use of alcohol, tobacco or illicit drugs  Current medications and supplements including opioid prescriptions. Patient is not currently taking opioid prescriptions. Functional ability and status Nutritional status Physical activity Advanced directives List of other physicians Hospitalizations, surgeries, and ER visits in previous 12 months Vitals Screenings to include cognitive, depression, and falls Referrals and appointments  In addition, I have reviewed and discussed with patient certain preventive protocols, quality metrics, and best practice  recommendations. A written personalized care plan for preventive services as well as general preventive health recommendations were provided to patient.     Marzella Schlein, LPN   05/09/1094   Nurse Notes: none

## 2022-09-09 NOTE — Patient Instructions (Signed)
[name] , Thank you for taking time to come for your Medicare Wellness Visit. I appreciate your ongoing commitment to your health goals. Please review the following plan we discussed and let me know if I can assist you in the future.   These are the goals we discussed:  Goals      Patient Stated     Increase activity & start going back to the Gym     Patient Stated     None at this time      Patient Stated     Lose weight         This is a list of the screening recommended for you and due dates:  Health Maintenance  Topic Date Due   Hemoglobin A1C  10/15/2022   Yearly kidney health urinalysis for diabetes  10/30/2022   Flu Shot  12/03/2022   Complete foot exam   01/31/2023   Yearly kidney function blood test for diabetes  07/13/2023   Eye exam for diabetics  08/28/2023   Medicare Annual Wellness Visit  09/09/2023   Mammogram  04/15/2024   DTaP/Tdap/Td vaccine (4 - Td or Tdap) 03/04/2025   Colon Cancer Screening  03/26/2029   Pneumonia Vaccine  Completed   DEXA scan (bone density measurement)  Completed   Hepatitis C Screening: USPSTF Recommendation to screen - Ages 51-79 yo.  Completed   Zoster (Shingles) Vaccine  Completed   HPV Vaccine  Aged Out   COVID-19 Vaccine  Discontinued    Advanced directives: copies in chart  Conditions/risks identified: lose weight   Next appointment: Follow up in one year for your annual wellness visit    Preventive Care 65 Years and Older, Female Preventive care refers to lifestyle choices and visits with your health care provider that can promote health and wellness. What does preventive care include? A yearly physical exam. This is also called an annual well check. Dental exams once or twice a year. Routine eye exams. Ask your health care provider how often you should have your eyes checked. Personal lifestyle choices, including: Daily care of your teeth and gums. Regular physical activity. Eating a healthy diet. Avoiding  tobacco and drug use. Limiting alcohol use. Practicing safe sex. Taking low-dose aspirin every day. Taking vitamin and mineral supplements as recommended by your health care provider. What happens during an annual well check? The services and screenings done by your health care provider during your annual well check will depend on your age, overall health, lifestyle risk factors, and family history of disease. Counseling  Your health care provider may ask you questions about your: Alcohol use. Tobacco use. Drug use. Emotional well-being. Home and relationship well-being. Sexual activity. Eating habits. History of falls. Memory and ability to understand (cognition). Work and work Astronomer. Reproductive health. Screening  You may have the following tests or measurements: Height, weight, and BMI. Blood pressure. Lipid and cholesterol levels. These may be checked every 5 years, or more frequently if you are over 66 years old. Skin check. Lung cancer screening. You may have this screening every year starting at age 26 if you have a 30-pack-year history of smoking and currently smoke or have quit within the past 15 years. Fecal occult blood test (FOBT) of the stool. You may have this test every year starting at age 93. Flexible sigmoidoscopy or colonoscopy. You may have a sigmoidoscopy every 5 years or a colonoscopy every 10 years starting at age 90. Hepatitis C blood test. Hepatitis B blood  test. Sexually transmitted disease (STD) testing. Diabetes screening. This is done by checking your blood sugar (glucose) after you have not eaten for a while (fasting). You may have this done every 1-3 years. Bone density scan. This is done to screen for osteoporosis. You may have this done starting at age 21. Mammogram. This may be done every 1-2 years. Talk to your health care provider about how often you should have regular mammograms. Talk with your health care provider about your test  results, treatment options, and if necessary, the need for more tests. Vaccines  Your health care provider may recommend certain vaccines, such as: Influenza vaccine. This is recommended every year. Tetanus, diphtheria, and acellular pertussis (Tdap, Td) vaccine. You may need a Td booster every 10 years. Zoster vaccine. You may need this after age 63. Pneumococcal 13-valent conjugate (PCV13) vaccine. One dose is recommended after age 62. Pneumococcal polysaccharide (PPSV23) vaccine. One dose is recommended after age 40. Talk to your health care provider about which screenings and vaccines you need and how often you need them. This information is not intended to replace advice given to you by your health care provider. Make sure you discuss any questions you have with your health care provider. Document Released: 05/17/2015 Document Revised: 01/08/2016 Document Reviewed: 02/19/2015 Elsevier Interactive Patient Education  2017 [place] Prevention in the Home Falls can cause injuries. They can happen to people of all ages. There are many things you can do to make your home safe and to help prevent falls. What can I do on the outside of my home? Regularly fix the edges of walkways and driveways and fix any cracks. Remove anything that might make you trip as you walk through a door, such as a raised step or threshold. Trim any bushes or trees on the path to your home. Use bright outdoor lighting. Clear any walking paths of anything that might make someone trip, such as rocks or tools. Regularly check to see if handrails are loose or broken. Make sure that both sides of any steps have handrails. Any raised decks and porches should have guardrails on the edges. Have any leaves, snow, or ice cleared regularly. Use sand or salt on walking paths during winter. Clean up any spills in your garage right away. This includes oil or grease spills. What can I do in the bathroom? Use night  lights. Install grab bars by the toilet and in the tub and shower. Do not use towel bars as grab bars. Use non-skid mats or decals in the tub or shower. If you need to sit down in the shower, use a plastic, non-slip stool. Keep the floor dry. Clean up any water that spills on the floor as soon as it happens. Remove soap buildup in the tub or shower regularly. Attach bath mats securely with double-sided non-slip rug tape. Do not have throw rugs and other things on the floor that can make you trip. What can I do in the bedroom? Use night lights. Make sure that you have a light by your bed that is easy to reach. Do not use any sheets or blankets that are too big for your bed. They should not hang down onto the floor. Have a firm chair that has side arms. You can use this for support while you get dressed. Do not have throw rugs and other things on the floor that can make you trip. What can I do in the kitchen? Clean up any spills right  away. Avoid walking on wet floors. Keep items that you use a lot in easy-to-reach places. If you need to reach something above you, use a strong step stool that has a grab bar. Keep electrical cords out of the way. Do not use floor polish or wax that makes floors slippery. If you must use wax, use non-skid floor wax. Do not have throw rugs and other things on the floor that can make you trip. What can I do with my stairs? Do not leave any items on the stairs. Make sure that there are handrails on both sides of the stairs and use them. Fix handrails that are broken or loose. Make sure that handrails are as long as the stairways. Check any carpeting to make sure that it is firmly attached to the stairs. Fix any carpet that is loose or worn. Avoid having throw rugs at the top or bottom of the stairs. If you do have throw rugs, attach them to the floor with carpet tape. Make sure that you have a light switch at the top of the stairs and the bottom of the stairs. If  you do not have them, ask someone to add them for you. What else can I do to help prevent falls? Wear shoes that: Do not have high heels. Have rubber bottoms. Are comfortable and fit you well. Are closed at the toe. Do not wear sandals. If you use a stepladder: Make sure that it is fully opened. Do not climb a closed stepladder. Make sure that both sides of the stepladder are locked into place. Ask someone to hold it for you, if possible. Clearly mark and make sure that you can see: Any grab bars or handrails. First and last steps. Where the edge of each step is. Use tools that help you move around (mobility aids) if they are needed. These include: Canes. Walkers. Scooters. Crutches. Turn on the lights when you go into a dark area. Replace any light bulbs as soon as they burn out. Set up your furniture so you have a clear path. Avoid moving your furniture around. If any of your floors are uneven, fix them. If there are any pets around you, be aware of where they are. Review your medicines with your doctor. Some medicines can make you feel dizzy. This can increase your chance of falling. Ask your doctor what other things that you can do to help prevent falls. This information is not intended to replace advice given to you by your health care provider. Make sure you discuss any questions you have with your health care provider. Document Released: 02/14/2009 Document Revised: 09/26/2015 Document Reviewed: 05/25/2014 Elsevier Interactive Patient Education  2017 [employer].

## 2022-09-23 ENCOUNTER — Encounter: Payer: Self-pay | Admitting: Family Medicine

## 2022-09-23 ENCOUNTER — Ambulatory Visit (INDEPENDENT_AMBULATORY_CARE_PROVIDER_SITE_OTHER): Payer: PPO | Admitting: Family Medicine

## 2022-09-23 VITALS — BP 106/73 | HR 85 | Temp 97.4°F | Wt 200.6 lb

## 2022-09-23 DIAGNOSIS — M8589 Other specified disorders of bone density and structure, multiple sites: Secondary | ICD-10-CM

## 2022-09-23 DIAGNOSIS — E785 Hyperlipidemia, unspecified: Secondary | ICD-10-CM | POA: Diagnosis not present

## 2022-09-23 DIAGNOSIS — E559 Vitamin D deficiency, unspecified: Secondary | ICD-10-CM | POA: Diagnosis not present

## 2022-09-23 DIAGNOSIS — Z7984 Long term (current) use of oral hypoglycemic drugs: Secondary | ICD-10-CM

## 2022-09-23 DIAGNOSIS — M0609 Rheumatoid arthritis without rheumatoid factor, multiple sites: Secondary | ICD-10-CM

## 2022-09-23 DIAGNOSIS — E1169 Type 2 diabetes mellitus with other specified complication: Secondary | ICD-10-CM

## 2022-09-23 DIAGNOSIS — I10 Essential (primary) hypertension: Secondary | ICD-10-CM

## 2022-09-23 DIAGNOSIS — G479 Sleep disorder, unspecified: Secondary | ICD-10-CM | POA: Diagnosis not present

## 2022-09-23 LAB — POCT GLYCOSYLATED HEMOGLOBIN (HGB A1C)
HbA1c POC (<> result, manual entry): 6.2 % (ref 4.0–5.6)
HbA1c, POC (controlled diabetic range): 6.2 % (ref 0.0–7.0)
HbA1c, POC (prediabetic range): 6.2 % (ref 5.7–6.4)
Hemoglobin A1C: 6.2 % — AB (ref 4.0–5.6)

## 2022-09-23 LAB — MICROALBUMIN / CREATININE URINE RATIO
Creatinine,U: 130.3 mg/dL
Microalb Creat Ratio: 0.7 mg/g (ref 0.0–30.0)
Microalb, Ur: 0.9 mg/dL (ref 0.0–1.9)

## 2022-09-23 MED ORDER — SITAGLIPTIN PHOSPHATE 50 MG PO TABS: 50.0000 mg | ORAL_TABLET | Freq: Every day | ORAL | 1 refills | Status: DC

## 2022-09-23 MED ORDER — LOSARTAN POTASSIUM 25 MG PO TABS: 12.5000 mg | ORAL_TABLET | Freq: Every day | ORAL | 1 refills | Status: DC

## 2022-09-23 MED ORDER — FENOFIBRATE 160 MG PO TABS: 160.0000 mg | ORAL_TABLET | Freq: Every day | ORAL | 3 refills | Status: DC

## 2022-09-23 MED ORDER — METFORMIN HCL 500 MG PO TABS: 500.0000 mg | ORAL_TABLET | Freq: Two times a day (BID) | ORAL | 1 refills | Status: DC

## 2022-09-23 MED ORDER — ZOLPIDEM TARTRATE 5 MG PO TABS: 5.0000 mg | ORAL_TABLET | Freq: Every evening | ORAL | 1 refills | Status: DC | PRN

## 2022-09-23 MED ORDER — PRAVASTATIN SODIUM 20 MG PO TABS: 20.0000 mg | ORAL_TABLET | Freq: Every day | ORAL | 1 refills | Status: DC

## 2022-09-23 NOTE — Patient Instructions (Signed)
Return in about 24 weeks (around 03/10/2023) for cpe (20 min), Routine chronic condition follow-up.  Fasting labs next appt in November      Great to see you today.  I have refilled the medication(s) we provide.   If labs were collected, we will inform you of lab results once received either by echart message or telephone call.   - echart message- for normal results that have been seen by the patient already.   - telephone call: abnormal results or if patient has not viewed results in their echart.

## 2022-09-23 NOTE — Progress Notes (Signed)
Patient ID: [name], female  DOB: 02-11-1946, 77 y.o.   MRN: [id number] Patient Care Team    Relationship Specialty Notifications Start End  Natalia Leatherwood, DO PCP - General Family Medicine  10/10/19   Pollyann Savoy, MD Consulting Physician Rheumatology  10/10/19   Charna Elizabeth, MD Consulting Physician Gastroenterology  10/10/19   Renae Fickle, OD  Optometry  10/10/19   Renae Fickle, OD  Optometry  09/23/22     Chief Complaint  Patient presents with   Medical Management of Chronic Issues    [name] is a 77 y.o.  female present for [facility] All past medical history, surgical history, allergies, family history, immunizations, medications and social history were updated in the electronic medical record today. All recent labs, ED visits and hospitalizations within the last year were reviewed.  Hypertension associated with diabetes (HCC)/obesity/statin intolerance/hyperlipidemia Pt reports compliance with losartan 12.5 mg daily, pravastatin and fenofibrate.  Blood pressures ranges at home not routinely checked.  Patient denies chest pain, shortness of breath, dizziness or lower extremity edema.  Pt takes a daily baby ASA.       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  Patient is prescribed methotrexate and folate.   Type 2 diabetes mellitus with unspecified complications (HCC)/cataracts associated with type 2 diabetes Pt reports compliance with metformin 500 mg twice daily and Januvia 50 mg qd.   She was diagnosed with diabetes when she was around 52.   Patient denies dizziness, hyperglycemic or hypoglycemic events.  Patient denies dizziness, hyperglycemic or hypoglycemic events. Patient denies numbness, tingling in the extremities or nonhealing wounds of feet.   vitamin D deficiency She reports she continues to supplement vitamin D 2000 units daily.   Sleep  disturbance Patient is prescribed Ambien 5 mg nightly as needed.   She very rarely takes this medication.  When she does she takes the medication she takes one third of a tab.       09/09/2022   10:39 AM 04/15/2022    1:06 PM 07/30/2021   11:07 AM 02/21/2020    8:22 AM 02/15/2020    9:54 AM  Depression screen PHQ 2/9  Decreased Interest 0 0 0 0 0  Down, Depressed, Hopeless 0 0 0 0 0  PHQ - 2 Score 0 0 0 0 0       No data to display                09/09/2022   10:42 AM 05/09/2022    9:13 PM 04/15/2022    1:06 PM 01/30/2022    1:33 PM 10/29/2021    8:54 AM  Fall Risk   Falls in the past year? 1 0 0 0 0  Number falls in past yr: 1 0 0  0  Injury with Fall? 0 0 0  0  Risk for fall due to : Impaired vision  No Fall Risks    Follow up Falls prevention discussed  Falls evaluation completed  Falls evaluation completed     Immunization History  Administered Date(s) Administered   COVID-19, mRNA, vaccine(Comirnaty)12 years and older 03/27/2022   Fluad Quad(high Dose 65+) 01/11/2019, 02/15/2020, 03/06/2022   Influenza Whole 04/21/2010   Influenza, High Dose Seasonal PF 02/20/2013, 02/07/2018, 01/30/2021   Influenza,inj,Quad PF,6+ Mos 02/23/2017   Influenza-Unspecified 02/15/2014, 01/30/2016, 02/07/2018, 01/11/2019   PFIZER Comirnaty(Gray Top)Covid-19 Tri-Sucrose Vaccine 10/09/2020   PFIZER(Purple Top)SARS-COV-2 Vaccination 06/08/2019, 07/03/2019,  01/04/2020   PNEUMOCOCCAL CONJUGATE-20 05/13/2021   Pfizer Covid-19 Vaccine Bivalent Booster 49yrs & up 03/24/2021   Pneumococcal Conjugate-13 02/20/2013   Pneumococcal Polysaccharide-23 04/05/2006   Pneumococcal-Unspecified 03/05/2015   Tdap 09/14/2001, 03/05/2015   Tetanus 03/27/2014   Zoster Recombinat (Shingrix) 10/12/2017, 02/07/2018   Zoster, Live 12/07/2013    No results found.  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   Diverticulitis    Family history of colonic polyps 05/16/2020   History of blood transfusion    Hyperlipidemia    Insomnia    Internal hemorrhoids 05/16/2020   Nasal polyps    Obesity    PONV (postoperative nausea and vomiting)    Radial styloid tenosynovitis of left hand 12/21/2019   Rectal bleeding 05/16/2020   Sinusitis, chronic 03/18/2010   Qualifier: Diagnosis of  By: Ninetta Lights MD, Tinnie Gens     Allergies  Allergen Reactions   Lipitor [Atorvastatin Calcium]     Myalgia   Past Surgical History:  Procedure Laterality Date   ABDOMINAL HYSTERECTOMY  2000   APPENDECTOMY     CARDIAC CATHETERIZATION N/A 03/27/2015   Procedure: Left Heart Cath and Coronary Angiography;  Surgeon: Yates Decamp, MD;  Location: [facility] INVASIVE CV LAB;  Service: Cardiovascular;  Laterality: N/A;   CESAREAN SECTION     ELBOW SURGERY Right    FIXATION KYPHOPLASTY LUMBAR SPINE  10/16/2020   L2 and L3 kyphoplasty   HAND TENDON SURGERY Left 02/04/2022   KNEE ARTHROPLASTY     KYPHOPLASTY N/A 10/16/2020   Procedure: LUMBAR 2 AND LUMBAR 3 KYPHOPLASTY;  Surgeon: Estill Bamberg, MD;  Location: [facility] OR;  Service: Orthopedics;  Laterality: N/A;   NASAL SINUS SURGERY     REPLACEMENT TOTAL KNEE Bilateral 2007 2009   TONSILLECTOMY AND ADENOIDECTOMY     WISDOM TOOTH EXTRACTION     Family History  Problem Relation Age of Onset   Arthritis Mother    Diabetes Mother        Type I   Aneurysm Father    Stroke Father    Arthritis Sister    Arthritis Sister    Down syndrome Son    Diabetes Son        type I   Social History   Social History Narrative   Marital status/children/pets: Married, 1 child.    Education/employment: Automotive engineer educated, retired.   Safety:      -smoke alarm in the home:Yes     - wears seatbelt: Yes     - Feels safe in their  relationships: Yes    Allergies as of 09/23/2022       Reactions   Lipitor [atorvastatin Calcium]    Myalgia        Medication List        Accurate as of Sep 23, 2022 11:17 AM. If you have any questions, ask your nurse or doctor.          aspirin EC 81 MG tablet Take 81 mg by mouth daily. Swallow whole.   cholecalciferol 25 MCG (1000 UNIT) tablet Commonly known as: VITAMIN D3 Take 1,000 Units by mouth daily.   fenofibrate 160 MG tablet Take 1 tablet (160 mg total) by  mouth daily.   FISH OIL PO Take by mouth daily.   fluticasone 50 MCG/ACT nasal spray Commonly known as: FLONASE 1 spray each nostril twice daily   folic acid 1 MG tablet Commonly known as: FOLVITE TAKE 2 TABLETS BY MOUTH EVERY DAY   losartan 25 MG tablet Commonly known as: COZAAR Take 0.5 tablets (12.5 mg total) by mouth daily.   metFORMIN 500 MG tablet Commonly known as: GLUCOPHAGE Take 1 tablet (500 mg total) by mouth 2 (two) times daily.   methotrexate 2.5 MG tablet Commonly known as: RHEUMATREX TAKE 7 TABLETS (17.5 MG TOTAL) BY MOUTH ONCE A WEEK.   pravastatin 20 MG tablet Commonly known as: PRAVACHOL Take 1 tablet (20 mg total) by mouth daily.   sitaGLIPtin 50 MG tablet Commonly known as: JANUVIA Take 1 tablet (50 mg total) by mouth daily.   zolpidem 5 MG tablet Commonly known as: AMBIEN Take 1 tablet (5 mg total) by mouth at bedtime as needed for sleep.        All past medical history, surgical history, allergies, family history, immunizations andmedications were updated in the EMR today and reviewed under the history and medication portions of their EMR.      MM 3D SCREEN BREAST BILATERAL Result Date: 11/26/2020 RECOMMENDATION: Screening mammogram in one year. (Code:SM-B-01Y) BI-RADS CATEGORY  1: Negative. Electronically Signed   By: Bary Richard M.D.   On: 11/26/2020 11:22    ROS 14 pt review of systems performed and negative (unless mentioned in an  HPI)  Objective: BP 106/73   Pulse 85   Temp (!) 97.4 F (36.3 C)   Wt 200 lb 9.6 oz (91 kg)   SpO2 96%   BMI 34.43 kg/m  Physical Exam Vitals and nursing note reviewed.  Constitutional:      General: She is not in acute distress.    Appearance: Normal appearance. She is not ill-appearing, toxic-appearing or diaphoretic.  HENT:     Head: Normocephalic and atraumatic.  Eyes:     General: No scleral icterus.       Right eye: No discharge.        Left eye: No discharge.     Extraocular Movements: Extraocular movements intact.     Conjunctiva/sclera: Conjunctivae normal.     Pupils: Pupils are equal, round, and reactive to light.  Cardiovascular:     Rate and Rhythm: Normal rate and regular rhythm.     Heart sounds: No murmur heard. Pulmonary:     Effort: Pulmonary effort is normal. No respiratory distress.     Breath sounds: Normal breath sounds. No wheezing, rhonchi or rales.  Skin:    General: Skin is warm and dry.     Coloration: Skin is not jaundiced or pale.     Findings: No erythema or rash.  Neurological:     Mental Status: She is alert and oriented to person, place, and time. Mental status is at baseline.     Motor: No weakness.     Gait: Gait normal.  Psychiatric:        Mood and Affect: Mood normal.        Behavior: Behavior normal.        Thought Content: Thought content normal.        Judgment: Judgment normal.      Assessment/plan: [name] is a 77 y.o. female present for [facility] Hypertension/Hyperlipidema/morbid obesity stable Continue losartan 12.5 mg qd Continue pravastatin -Continue omega-3 and fenofibrate. Labs due next visit  Osteopenia of multiple sites/vitamin D deficiency Stable Continue vitamin D 2000 units daily> Labs up-to-date Bone density scheduled for this week   diabetes (HCC)/obesity/cataracts associated with diabetes diabetes associated with hyperlipidemia Stable Continue  metformin 500 mg twice daily for now> had SE to  higher dose.  Continue Januvia 50 mg daily Continue routine exercise and low-sodium diet. Diabetic diet encouraged PNA series: Completed  PNA20 Flu shot: completed (recommneded yearly) Foot exam: Completed 04/15/2022 Eye exam: Completed-08/28/2022 Dr. Sharl Ma A1c:7.4>>7.6>6.5>6.4> 6.5 > 6.2> 6.1>6.2 A1c collected today    Rheumatoid arthritis of multiple sites with negative rheumatoid factor (HCC) Prescribed methotrexate and folate managed by rheumatology.Stable.    Sleep disturbance: Stable Continue Ambien half tab as needed-rarely uses medication. Patient reports she did not need medication refill today. [place] controlled substance database reviewed 09/23/22  Return in about 24 weeks (around 03/10/2023) for cpe (20 min), Routine chronic condition follow-up w/ fasting labs.  Orders Placed This Encounter  Procedures   Urine Microalbumin w/creat. ratio   POCT HgB A1C   Meds ordered this encounter  Medications   fenofibrate 160 MG tablet    Sig: Take 1 tablet (160 mg total) by mouth daily.    Dispense:  90 tablet    Refill:  3   losartan (COZAAR) 25 MG tablet    Sig: Take 0.5 tablets (12.5 mg total) by mouth daily.    Dispense:  45 tablet    Refill:  1   metFORMIN (GLUCOPHAGE) 500 MG tablet    Sig: Take 1 tablet (500 mg total) by mouth 2 (two) times daily.    Dispense:  180 tablet    Refill:  1   pravastatin (PRAVACHOL) 20 MG tablet    Sig: Take 1 tablet (20 mg total) by mouth daily.    Dispense:  90 tablet    Refill:  1   sitaGLIPtin (JANUVIA) 50 MG tablet    Sig: Take 1 tablet (50 mg total) by mouth daily.    Dispense:  90 tablet    Refill:  1   zolpidem (AMBIEN) 5 MG tablet    Sig: Take 1 tablet (5 mg total) by mouth at bedtime as needed for sleep.    Dispense:  90 tablet    Refill:  1   Referral Orders  No referral(s) requested today     Note is dictated utilizing voice recognition software. Although note has been proof read prior to signing,  occasional typographical errors still can be missed. If any questions arise, please do not hesitate to call for verification.  Electronically signed by: Felix Pacini, DO [facility] [place]

## 2022-09-30 ENCOUNTER — Ambulatory Visit: Payer: PPO | Admitting: Family Medicine

## 2022-10-05 DIAGNOSIS — M545 Low back pain, unspecified: Secondary | ICD-10-CM | POA: Diagnosis not present

## 2022-10-13 ENCOUNTER — Other Ambulatory Visit: Payer: Self-pay | Admitting: Physician Assistant

## 2022-10-13 DIAGNOSIS — Z79899 Other long term (current) drug therapy: Secondary | ICD-10-CM

## 2022-10-13 NOTE — Telephone Encounter (Signed)
Last Fill: 07/21/2022  Labs: 07/13/2022  Next Visit: 12/23/2022  Last Visit: 07/21/2022  DX: Rheumatoid arthritis of multiple sites with negative rheumatoid factor   Current Dose per office note 07/21/2022: Methotrexate 7 tablets once weekly   Patient advised she is due to update labs. Patient advised of lab hours.   Okay to refill Methotrexate?

## 2022-10-19 ENCOUNTER — Other Ambulatory Visit: Payer: Self-pay | Admitting: *Deleted

## 2022-10-19 DIAGNOSIS — Z79899 Other long term (current) drug therapy: Secondary | ICD-10-CM | POA: Diagnosis not present

## 2022-10-19 DIAGNOSIS — M47816 Spondylosis without myelopathy or radiculopathy, lumbar region: Secondary | ICD-10-CM | POA: Diagnosis not present

## 2022-10-20 ENCOUNTER — Other Ambulatory Visit: Payer: Self-pay | Admitting: *Deleted

## 2022-10-20 DIAGNOSIS — Z79899 Other long term (current) drug therapy: Secondary | ICD-10-CM

## 2022-10-20 LAB — COMPLETE METABOLIC PANEL WITH GFR
AG Ratio: 1.4 (calc) (ref 1.0–2.5)
ALT: 16 U/L (ref 6–29)
AST: 23 U/L (ref 10–35)
Albumin: 4.1 g/dL (ref 3.6–5.1)
Alkaline phosphatase (APISO): 38 U/L (ref 37–153)
BUN/Creatinine Ratio: 17 (calc) (ref 6–22)
BUN: 18 mg/dL (ref 7–25)
CO2: 29 mmol/L (ref 20–32)
Calcium: 9.5 mg/dL (ref 8.6–10.4)
Chloride: 102 mmol/L (ref 98–110)
Creat: 1.04 mg/dL — ABNORMAL HIGH (ref 0.60–1.00)
Globulin: 3 g/dL (calc) (ref 1.9–3.7)
Glucose, Bld: 150 mg/dL — ABNORMAL HIGH (ref 65–99)
Potassium: 4.1 mmol/L (ref 3.5–5.3)
Sodium: 139 mmol/L (ref 135–146)
Total Bilirubin: 0.4 mg/dL (ref 0.2–1.2)
Total Protein: 7.1 g/dL (ref 6.1–8.1)
eGFR: 56 mL/min/{1.73_m2} — ABNORMAL LOW (ref >=60)

## 2022-10-20 LAB — CBC WITH DIFFERENTIAL/PLATELET
Absolute Monocytes: 281 cells/uL (ref 200–950)
Basophils Absolute: 58 cells/uL (ref 0–200)
Basophils Relative: 1.1 %
Eosinophils Absolute: 133 cells/uL (ref 15–500)
Eosinophils Relative: 2.5 %
HCT: 39.5 % (ref 35.0–45.0)
Hemoglobin: 13.1 g/dL (ref 11.7–15.5)
Lymphs Abs: 2141 cells/uL (ref 850–3900)
MCH: 30.4 pg (ref 27.0–33.0)
MCHC: 33.2 g/dL (ref 32.0–36.0)
MCV: 91.6 fL (ref 80.0–100.0)
MPV: 9.6 fL (ref 7.5–12.5)
Monocytes Relative: 5.3 %
Neutro Abs: 2687 cells/uL (ref 1500–7800)
Neutrophils Relative %: 50.7 %
Platelets: 303 10*3/uL (ref 140–400)
RBC: 4.31 10*6/uL (ref 3.80–5.10)
RDW: 13.8 % (ref 11.0–15.0)
Total Lymphocyte: 40.4 %
WBC: 5.3 10*3/uL (ref 3.8–10.8)

## 2022-10-20 MED ORDER — METHOTREXATE SODIUM 2.5 MG PO TABS: 15.0000 mg | ORAL_TABLET | ORAL | 0 refills | Status: DC

## 2022-10-20 NOTE — Progress Notes (Signed)
CBC WNL Glucose is elevated-150. Creatinine is elevated-1.04 and GFR is low-56. Please clarify if she has been taking any NSAIDs? Any other medication changes? Please advise patient to reduce methotrexate to 6 tablets once weekly and recheck BMP with GFR in 2-3 weeks.

## 2022-10-20 NOTE — Telephone Encounter (Signed)
-----   Message from Gearldine Bienenstock, PA-C sent at 10/20/2022  7:23 AM EDT ----- CBC WNL Glucose is elevated-150. Creatinine is elevated-1.04 and GFR is low-56. Please clarify if she has been taking any NSAIDs? Any other medication changes? Please advise patient to reduce methotrexate to 6 tablets once weekly and recheck BMP with GFR in  2-3 weeks.

## 2022-11-04 DIAGNOSIS — M47816 Spondylosis without myelopathy or radiculopathy, lumbar region: Secondary | ICD-10-CM | POA: Diagnosis not present

## 2022-11-13 ENCOUNTER — Other Ambulatory Visit: Payer: Self-pay | Admitting: *Deleted

## 2022-11-13 DIAGNOSIS — Z79899 Other long term (current) drug therapy: Secondary | ICD-10-CM | POA: Diagnosis not present

## 2022-11-14 LAB — BASIC METABOLIC PANEL WITH GFR
BUN: 18 mg/dL (ref 7–25)
CO2: 27 mmol/L (ref 20–32)
Calcium: 9.7 mg/dL (ref 8.6–10.4)
Chloride: 103 mmol/L (ref 98–110)
Creat: 0.79 mg/dL (ref 0.60–1.00)
Glucose, Bld: 96 mg/dL (ref 65–99)
Potassium: 4.4 mmol/L (ref 3.5–5.3)
Sodium: 138 mmol/L (ref 135–146)
eGFR: 78 mL/min/{1.73_m2} (ref >=60)

## 2022-11-16 NOTE — Progress Notes (Signed)
BMP with GFR WNL. Renal function has improved.

## 2022-11-30 DIAGNOSIS — M47816 Spondylosis without myelopathy or radiculopathy, lumbar region: Secondary | ICD-10-CM | POA: Diagnosis not present

## 2022-12-10 NOTE — Progress Notes (Signed)
Office Visit Note  Patient: [name]             Date of Birth: 02-12-1946           MRN: [id number]             PCP: Natalia Leatherwood, DO Referring: Natalia Leatherwood, DO Visit Date: 12/23/2022 Occupation: @GUAROCC @  Subjective:  Medication management  History of Present Illness: ALEYIAH Hoffman is a 77 y.o. female with seronegative rheumatoid arthritis and osteoarthritis.  She states she is not having any joint pain or discomfort except for her bilateral knee replacements which continue to hurt.  She was taking methotrexate 7 tablets p.o. weekly and we reduced the dose to 6 tablets p.o. weekly because of elevated creatinine.  Repeat labs were normal.  Patient states she herself increase the dose of methotrexate to 7 tablets p.o. weekly as her labs were normal.  She states that shoulder joint pain is better.  She was having some lower back pain.  She states she had injections by Dr. Modesto Charon which were helpful.  She is not having much discomfort in the right trochanteric region now.    Activities of Daily Living:  Patient reports morning stiffness for 0  none .   Patient Reports nocturnal pain.  Difficulty dressing/grooming: Denies Difficulty climbing stairs: Reports Difficulty getting out of chair: Reports Difficulty using hands for taps, buttons, cutlery, and/or writing: Denies  Review of Systems  Constitutional:  Negative for fatigue.  HENT:  Negative for mouth sores and mouth dryness.   Eyes:  Negative for dryness.  Respiratory:  Negative for shortness of breath.   Cardiovascular:  Negative for chest pain and palpitations.  Gastrointestinal:  Negative for blood in stool, constipation and diarrhea.  Endocrine: Negative for increased urination.  Genitourinary:  Negative for involuntary urination.  Musculoskeletal:  Positive for joint pain, gait problem, joint pain, muscle weakness and muscle tenderness. Negative for joint swelling, myalgias, morning stiffness and myalgias.   Skin:  Positive for hair loss. Negative for color change, rash and sensitivity to sunlight.  Allergic/Immunologic: Negative for susceptible to infections.  Neurological:  Positive for dizziness. Negative for headaches.  Hematological:  Negative for swollen glands.  Psychiatric/Behavioral:  Positive for sleep disturbance. Negative for depressed mood. The patient is not nervous/anxious.     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   Hypermetropia of both eyes 04/22/2018   Hyperopia with presbyopia 04/22/2018   Osteopenia 02/14/2016   Benign hypertension 02/16/2012   Occasional cigarette smoker 04/07/2010   DM1 w/ Hyperlipidemia (HCC) 03/18/2010    Past Medical History:  Diagnosis Date   Allergy    Arthritis    Bursitis of right hip    Chicken pox    Colon polyp    COPD (chronic obstructive pulmonary disease) (HCC)    CVA (cerebral infarction)    Diabetes mellitus without complication (HCC)    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   Diverticulitis    Family history of colonic polyps 05/16/2020   History of blood transfusion    Hyperlipidemia    Insomnia  Internal hemorrhoids 05/16/2020   Nasal polyps    Obesity    PONV (postoperative nausea and vomiting)    Radial styloid tenosynovitis of left hand 12/21/2019   Rectal bleeding 05/16/2020   Sinusitis, chronic 03/18/2010   Qualifier: Diagnosis of  By: Ninetta Lights MD, Jewel Baize History  Problem Relation Age of Onset   Arthritis Mother    Diabetes Mother        Type I   Aneurysm Father    Stroke Father     Arthritis Sister    Arthritis Sister    Down syndrome Son    Diabetes Son        type I   Past Surgical History:  Procedure Laterality Date   ABDOMINAL HYSTERECTOMY  2000   APPENDECTOMY     CARDIAC CATHETERIZATION N/A 03/27/2015   Procedure: Left Heart Cath and Coronary Angiography;  Surgeon: Yates Decamp, MD;  Location: [facility] INVASIVE CV LAB;  Service: Cardiovascular;  Laterality: N/A;   CESAREAN SECTION     ELBOW SURGERY Right    FIXATION KYPHOPLASTY LUMBAR SPINE  10/16/2020   L2 and L3 kyphoplasty   HAND TENDON SURGERY Left 02/04/2022   KNEE ARTHROPLASTY     KYPHOPLASTY N/A 10/16/2020   Procedure: LUMBAR 2 AND LUMBAR 3 KYPHOPLASTY;  Surgeon: Estill Bamberg, MD;  Location: [facility] OR;  Service: Orthopedics;  Laterality: N/A;   NASAL SINUS SURGERY     REPLACEMENT TOTAL KNEE Bilateral 2007 2009   TONSILLECTOMY AND ADENOIDECTOMY     WISDOM TOOTH EXTRACTION     Social History   Social History Narrative   Marital status/children/pets: Married, 1 child.    Education/employment: Automotive engineer educated, retired.   Safety:      -smoke alarm in the home:Yes     - wears seatbelt: Yes     - Feels safe in their relationships: Yes   Immunization History  Administered Date(s) Administered   COVID-19, mRNA, vaccine(Comirnaty)12 years and older 03/27/2022   Fluad Quad(high Dose 65+) 01/11/2019, 02/15/2020, 03/06/2022   Influenza Whole 04/21/2010   Influenza, High Dose Seasonal PF 02/20/2013, 02/07/2018, 01/30/2021   Influenza,inj,Quad PF,6+ Mos 02/23/2017   Influenza-Unspecified 02/15/2014, 01/30/2016, 02/07/2018, 01/11/2019   PFIZER Comirnaty(Gray Top)Covid-19 Tri-Sucrose Vaccine 10/09/2020   PFIZER(Purple Top)SARS-COV-2 Vaccination 06/08/2019, 07/03/2019, 01/04/2020   PNEUMOCOCCAL CONJUGATE-20 05/13/2021   Pfizer Covid-19 Vaccine Bivalent Booster 14yrs & up 03/24/2021   Pneumococcal Conjugate-13 02/20/2013   Pneumococcal Polysaccharide-23 04/05/2006   Pneumococcal-Unspecified 03/05/2015   Tdap  09/14/2001, 03/05/2015   Tetanus 03/27/2014   Zoster Recombinant(Shingrix) 10/12/2017, 02/07/2018   Zoster, Live 12/07/2013     Objective: Vital Signs: BP 136/78 (BP Location: Left Arm, Patient Position: Sitting, Cuff Size: Normal)   Pulse 90   Resp 16   Ht 5\' 4"  (1.626 m)   Wt 202 lb (91.6 kg)   BMI 34.67 kg/m    Physical Exam Vitals and nursing note reviewed.  Constitutional:      Appearance: She is well-developed.  HENT:     Head: Normocephalic and atraumatic.  Eyes:     Conjunctiva/sclera: Conjunctivae normal.  Cardiovascular:     Rate and Rhythm: Normal rate and regular rhythm.     Heart sounds: Normal heart sounds.  Pulmonary:     Effort: Pulmonary effort is normal.     Breath sounds: Normal breath sounds.  Abdominal:     General: Bowel sounds are normal.     Palpations: Abdomen is soft.  Musculoskeletal:     Cervical  back: Normal range of motion.  Lymphadenopathy:     Cervical: No cervical adenopathy.  Skin:    General: Skin is warm and dry.     Capillary Refill: Capillary refill takes less than 2 seconds.  Neurological:     Mental Status: She is alert and oriented to person, place, and time.  Psychiatric:        Behavior: Behavior normal.      Musculoskeletal Exam: Cervical spine was in good range of motion.  She had discomfort and limited range of motion of the right shoulder.  Left shoulder joint with good range of motion.  Elbow joints, wrist joints, MCPs PIPs and DIPs with good range of motion with no synovitis.  She had discomfort range of motion of her hip joints.  Knee joints were replaced and had discomfort with range of motion some warmth on palpation.  There was no tenderness over ankles or MTPs.  No synovitis was noted.   CDAI Exam: CDAI Score: -- Patient Global: 0 / 100; Provider Global: 0 / 100 Swollen: --; Tender: -- Joint Exam 12/23/2022   No joint exam has been documented for this visit   There is currently no information documented on  the homunculus. Go to the Rheumatology activity and complete the homunculus joint exam.  Investigation: No additional findings.  Imaging: No results found.  Recent Labs: Lab Results  Component Value Date   WBC 5.3 10/19/2022   HGB 13.1 10/19/2022   PLT 303 10/19/2022   NA 138 11/13/2022   K 4.4 11/13/2022   CL 103 11/13/2022   CO2 27 11/13/2022   GLUCOSE 96 11/13/2022   BUN 18 11/13/2022   CREATININE 0.79 11/13/2022   BILITOT 0.4 10/19/2022   ALKPHOS 37 (L) 10/29/2021   AST 23 10/19/2022   ALT 16 10/19/2022   PROT 7.1 10/19/2022   ALBUMIN 4.3 10/29/2021   CALCIUM 9.7 11/13/2022   GFRAA 73 09/05/2020   QFTBGOLDPLUS NEGATIVE 06/28/2019    Speciality Comments: No specialty comments available.  Procedures:  No procedures performed Allergies: Lipitor [atorvastatin calcium]   Assessment / Plan:     Visit Diagnoses: Rheumatoid arthritis of multiple sites with negative rheumatoid factor (HCC)-patient had no synovitis on the examination today.  Patient states that she did not stay on the lower dose of methotrexate at 6 tablets p.o. weekly as recommended.  She increase the dose back to 7 tablets p.o. weekly after labs were normal.  As she did not develop any flare on the lower dose of methotrexate.  She had no synovitis on the examination today.  High risk medication use - Methotrexate 7 tablets once weekly and folic acid 2 mg by mouth daily. -Labs obtained on October 19, 2022 CBC was normal CMP showed creatinine 1.04.  Repeat creatinine was improved to normal in July 2024 after reducing the dose of methotrexate to 6 tablets p.o. weekly.  Patient increase the dose of methotrexate to 7 tablets p.o. weekly herself.  I will check labs today.  Patient wants a refill on methotrexate.  Based on her labs we can determine her future dose of methotrexate.  Plan: CBC with Differential/Platelet, COMPLETE METABOLIC PANEL WITH GFR  Primary osteoarthritis of both hands-she had bilateral PIP and DIP  thickening.  No synovitis or synovial thickening was noted on the examination.  Chronic pain of both hips-patient continues to have pain and discomfort in the hip joints with range of motion.  Patient states she had x-rays at by the orthopedics in  the past which showed degenerative changes.  Patient will follow-up with orthopedic surgeon.  Status post total bilateral knee replacement -she continues to have some discomfort in her knee joints.  Both knee joints are replaced.  She is followed by Dr. Jerl Santos.  Primary osteoarthritis of both feet-she denies any discomfort in her feet today.  No synovitis was noted.  Osteopenia of multiple sites - Kyphoplasty for L2 and L3 on 10/16/2020 performed by Dr. Yevette Edwards.DEXA updated on The BMD measured at Femur Neck Right is 0.883 g/cm2 with a T-scoreof -1.1.  Use of calcium and vitamin D was advised.  Regular exercise was advised.  Vitamin D deficiency-vitamin D was normal on June 2023.  Trochanteric bursitis, right hip -she gets intermittent discomfort.  Under the care of Dr. Jerl Santos.  Chronic right shoulder pain -she continues to have limited painful range of motion of her right shoulder joint.  She is under the care of Dr. Jerl Santos.  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    Follow-Up Instructions: Return in about 5 months (around 05/25/2023) for Rheumatoid arthritis,  Osteoarthritis.   Pollyann Savoy, MD  Note - This record has been created using Animal nutritionist.  Chart creation errors have been sought, but may not always  have been located. Such creation errors do not reflect on  the standard of medical care.

## 2022-12-23 ENCOUNTER — Ambulatory Visit: Payer: PPO | Attending: Rheumatology | Admitting: Rheumatology

## 2022-12-23 ENCOUNTER — Encounter: Payer: Self-pay | Admitting: Rheumatology

## 2022-12-23 VITALS — BP 136/78 | HR 90 | Resp 16 | Ht 64.0 in | Wt 202.0 lb

## 2022-12-23 DIAGNOSIS — M8589 Other specified disorders of bone density and structure, multiple sites: Secondary | ICD-10-CM | POA: Diagnosis not present

## 2022-12-23 DIAGNOSIS — E118 Type 2 diabetes mellitus with unspecified complications: Secondary | ICD-10-CM

## 2022-12-23 DIAGNOSIS — H47013 Ischemic optic neuropathy, bilateral: Secondary | ICD-10-CM

## 2022-12-23 DIAGNOSIS — Z789 Other specified health status: Secondary | ICD-10-CM

## 2022-12-23 DIAGNOSIS — M25552 Pain in left hip: Secondary | ICD-10-CM

## 2022-12-23 DIAGNOSIS — I152 Hypertension secondary to endocrine disorders: Secondary | ICD-10-CM

## 2022-12-23 DIAGNOSIS — Z96653 Presence of artificial knee joint, bilateral: Secondary | ICD-10-CM | POA: Diagnosis not present

## 2022-12-23 DIAGNOSIS — M0609 Rheumatoid arthritis without rheumatoid factor, multiple sites: Secondary | ICD-10-CM

## 2022-12-23 DIAGNOSIS — M791 Myalgia, unspecified site: Secondary | ICD-10-CM

## 2022-12-23 DIAGNOSIS — M19072 Primary osteoarthritis, left ankle and foot: Secondary | ICD-10-CM

## 2022-12-23 DIAGNOSIS — M7061 Trochanteric bursitis, right hip: Secondary | ICD-10-CM

## 2022-12-23 DIAGNOSIS — M25511 Pain in right shoulder: Secondary | ICD-10-CM | POA: Diagnosis not present

## 2022-12-23 DIAGNOSIS — G8929 Other chronic pain: Secondary | ICD-10-CM

## 2022-12-23 DIAGNOSIS — R5383 Other fatigue: Secondary | ICD-10-CM

## 2022-12-23 DIAGNOSIS — T466X5A Adverse effect of antihyperlipidemic and antiarteriosclerotic drugs, initial encounter: Secondary | ICD-10-CM

## 2022-12-23 DIAGNOSIS — E1169 Type 2 diabetes mellitus with other specified complication: Secondary | ICD-10-CM

## 2022-12-23 DIAGNOSIS — E1136 Type 2 diabetes mellitus with diabetic cataract: Secondary | ICD-10-CM

## 2022-12-23 DIAGNOSIS — M19041 Primary osteoarthritis, right hand: Secondary | ICD-10-CM | POA: Diagnosis not present

## 2022-12-23 DIAGNOSIS — Z720 Tobacco use: Secondary | ICD-10-CM

## 2022-12-23 DIAGNOSIS — M19071 Primary osteoarthritis, right ankle and foot: Secondary | ICD-10-CM

## 2022-12-23 DIAGNOSIS — E559 Vitamin D deficiency, unspecified: Secondary | ICD-10-CM | POA: Diagnosis not present

## 2022-12-23 DIAGNOSIS — M25551 Pain in right hip: Secondary | ICD-10-CM

## 2022-12-23 DIAGNOSIS — E1159 Type 2 diabetes mellitus with other circulatory complications: Secondary | ICD-10-CM

## 2022-12-23 DIAGNOSIS — M19042 Primary osteoarthritis, left hand: Secondary | ICD-10-CM

## 2022-12-23 DIAGNOSIS — Z79899 Other long term (current) drug therapy: Secondary | ICD-10-CM | POA: Diagnosis not present

## 2022-12-23 DIAGNOSIS — Z8709 Personal history of other diseases of the respiratory system: Secondary | ICD-10-CM

## 2022-12-23 DIAGNOSIS — E785 Hyperlipidemia, unspecified: Secondary | ICD-10-CM

## 2022-12-23 LAB — CBC WITH DIFFERENTIAL/PLATELET
Absolute Monocytes: 437 cells/uL (ref 200–950)
Basophils Absolute: 50 cells/uL (ref 0–200)
Basophils Relative: 0.9 %
Eosinophils Absolute: 157 cells/uL (ref 15–500)
Eosinophils Relative: 2.8 %
HCT: 40.7 % (ref 35.0–45.0)
Hemoglobin: 13.6 g/dL (ref 11.7–15.5)
Lymphs Abs: 1854 cells/uL (ref 850–3900)
MCH: 30.2 pg (ref 27.0–33.0)
MCHC: 33.4 g/dL (ref 32.0–36.0)
MCV: 90.2 fL (ref 80.0–100.0)
MPV: 9.9 fL (ref 7.5–12.5)
Monocytes Relative: 7.8 %
Neutro Abs: 3102 cells/uL (ref 1500–7800)
Neutrophils Relative %: 55.4 %
Platelets: 286 10*3/uL (ref 140–400)
RBC: 4.51 10*6/uL (ref 3.80–5.10)
RDW: 13.4 % (ref 11.0–15.0)
Total Lymphocyte: 33.1 %
WBC: 5.6 10*3/uL (ref 3.8–10.8)

## 2022-12-23 LAB — COMPLETE METABOLIC PANEL WITH GFR
AG Ratio: 1.5 (calc) (ref 1.0–2.5)
ALT: 13 U/L (ref 6–29)
AST: 22 U/L (ref 10–35)
Albumin: 4.4 g/dL (ref 3.6–5.1)
Alkaline phosphatase (APISO): 38 U/L (ref 37–153)
BUN: 20 mg/dL (ref 7–25)
CO2: 25 mmol/L (ref 20–32)
Calcium: 9.8 mg/dL (ref 8.6–10.4)
Chloride: 103 mmol/L (ref 98–110)
Creat: 0.95 mg/dL (ref 0.60–1.00)
Globulin: 2.9 g/dL (calc) (ref 1.9–3.7)
Glucose, Bld: 110 mg/dL — ABNORMAL HIGH (ref 65–99)
Potassium: 4.5 mmol/L (ref 3.5–5.3)
Sodium: 139 mmol/L (ref 135–146)
Total Bilirubin: 0.4 mg/dL (ref 0.2–1.2)
Total Protein: 7.3 g/dL (ref 6.1–8.1)
eGFR: 63 mL/min/{1.73_m2} (ref >=60)

## 2022-12-23 NOTE — Patient Instructions (Addendum)
Standing Labs We placed an order today for your standing lab work.   Please have your standing labs drawn in November and every 3 months  Please have your labs drawn 2 weeks prior to your appointment so that the provider can discuss your lab results at your appointment, if possible.  Please note that you may see your imaging and lab results in MyChart before we have reviewed them. We will contact you once all results are reviewed. Please allow our office up to 72 hours to thoroughly review all of the results before contacting the office for clarification of your results.  WALK-IN LAB HOURS  Monday through Thursday from 8:00 am -12:30 pm and 1:00 pm-5:00 pm and Friday from 8:00 am-12:00 pm.  Patients with office visits requiring labs will be seen before walk-in labs.  You may encounter longer than normal wait times. Please allow additional time. Wait times may be shorter on  Monday and Thursday afternoons.  We do not book appointments for walk-in labs. We appreciate your patience and understanding with our staff.   Labs are drawn by [facility]. Please bring your co-pay at the time of your lab draw.  You may receive a bill from [facility] for your lab work.  Please note if you are on Hydroxychloroquine and and an order has been placed for a Hydroxychloroquine level,  you will need to have it drawn 4 hours or more after your last dose.  If you wish to have your labs drawn at another location, please call the office 24 hours in advance so we can fax the orders.  The office is located at [address], [place], [place] [address]   If you have any questions regarding directions or hours of operation,  please call [phone].   As a reminder, please drink plenty of water prior to coming for your lab work. Thanks!   Vaccines You are taking a medication(s) that can suppress your immune system.  The following immunizations are recommended: Flu annually Covid-19  Td/Tdap (tetanus,  diphtheria, pertussis) every 10 years Pneumonia (Prevnar 15 then Pneumovax 23 at least 1 year apart.  Alternatively, can take Prevnar 20 without needing additional dose) Shingrix: 2 doses from 4 weeks to 6 months apart  Please check with your PCP to make sure you are up to date.  If you have signs or symptoms of an infection or start antibiotics: First, call your PCP for workup of your infection. Hold your medication through the infection, until you complete your antibiotics, and until symptoms resolve if you take the following: Injectable medication (Actemra, Benlysta, Cimzia, Cosentyx, Enbrel, Humira, Kevzara, Orencia, Remicade, Simponi, Stelara, Taltz, Tremfya) Methotrexate Leflunomide (Arava) Mycophenolate (Cellcept) Harriette Ohara, Olumiant, or Rinvoq

## 2022-12-24 NOTE — Progress Notes (Signed)
CBC and CMP are normal.  Glucose is mildly elevated probably not a fasting sample.

## 2022-12-25 NOTE — Progress Notes (Signed)
Patient may continue current dose of methotrexate at 7 tablets p.o. weekly.  She should continue to get labs every 3 months.

## 2022-12-28 ENCOUNTER — Other Ambulatory Visit: Payer: Self-pay | Admitting: Physician Assistant

## 2022-12-28 NOTE — Telephone Encounter (Signed)
Last Fill: 10/20/2022  Labs: 12/23/2022 CBC and CMP are normal.  Glucose is mildly elevated probably not a fasting sample. Patient may continue current dose of methotrexate at 7 tablets p.o. weekly.  She should continue to get labs every 3 months.   Next Visit: 05/25/2023  Last Visit: 12/23/2022  DX: Rheumatoid arthritis of multiple sites with negative rheumatoid factor   Current Dose per office note 12/23/2022: Methotrexate 7 tablets once weekly   Okay to refill Methotrexate?

## 2023-03-10 DIAGNOSIS — M25551 Pain in right hip: Secondary | ICD-10-CM | POA: Diagnosis not present

## 2023-03-11 ENCOUNTER — Telehealth: Payer: Self-pay | Admitting: Family Medicine

## 2023-03-11 ENCOUNTER — Encounter: Payer: Self-pay | Admitting: *Deleted

## 2023-03-11 DIAGNOSIS — Z0279 Encounter for issue of other medical certificate: Secondary | ICD-10-CM

## 2023-03-11 NOTE — Telephone Encounter (Signed)
Patient will need surgical clearance appt, please assist  with scheduling, thanks.  Form given to CMA

## 2023-03-11 NOTE — Telephone Encounter (Signed)
Patient scheduled for 11/19 at 1:40 pm

## 2023-03-11 NOTE — Telephone Encounter (Signed)
[facility] faxed pre-operative assessment to be filled out by provider. Patient requested to send it back via Fax within ASAP. Document is located in providers tray at front office.Please advise at [facility] [phone]

## 2023-03-15 ENCOUNTER — Other Ambulatory Visit: Payer: Self-pay | Admitting: Physician Assistant

## 2023-03-15 DIAGNOSIS — M0609 Rheumatoid arthritis without rheumatoid factor, multiple sites: Secondary | ICD-10-CM

## 2023-03-15 NOTE — Telephone Encounter (Signed)
Last Fill: 05/12/2022  Next Visit: 05/25/2023  Last Visit: 12/23/2022  Dx: Rheumatoid arthritis of multiple sites with negative rheumatoid factor   Current Dose per office note on 12/23/2022: folic acid 2 mg by mouth daily.   Okay to refill Folic Acid?

## 2023-03-17 ENCOUNTER — Encounter: Payer: Self-pay | Admitting: Family Medicine

## 2023-03-17 ENCOUNTER — Ambulatory Visit (INDEPENDENT_AMBULATORY_CARE_PROVIDER_SITE_OTHER): Payer: PPO | Admitting: Family Medicine

## 2023-03-17 VITALS — BP 124/82 | HR 84 | Temp 97.5°F | Ht 64.0 in | Wt 198.0 lb

## 2023-03-17 DIAGNOSIS — Z0000 Encounter for general adult medical examination without abnormal findings: Secondary | ICD-10-CM

## 2023-03-17 DIAGNOSIS — Z23 Encounter for immunization: Secondary | ICD-10-CM

## 2023-03-17 DIAGNOSIS — E559 Vitamin D deficiency, unspecified: Secondary | ICD-10-CM

## 2023-03-17 DIAGNOSIS — Z7984 Long term (current) use of oral hypoglycemic drugs: Secondary | ICD-10-CM

## 2023-03-17 DIAGNOSIS — E785 Hyperlipidemia, unspecified: Secondary | ICD-10-CM

## 2023-03-17 DIAGNOSIS — G479 Sleep disorder, unspecified: Secondary | ICD-10-CM | POA: Diagnosis not present

## 2023-03-17 DIAGNOSIS — M8589 Other specified disorders of bone density and structure, multiple sites: Secondary | ICD-10-CM

## 2023-03-17 DIAGNOSIS — I10 Essential (primary) hypertension: Secondary | ICD-10-CM | POA: Diagnosis not present

## 2023-03-17 DIAGNOSIS — Z1231 Encounter for screening mammogram for malignant neoplasm of breast: Secondary | ICD-10-CM

## 2023-03-17 DIAGNOSIS — E1169 Type 2 diabetes mellitus with other specified complication: Secondary | ICD-10-CM | POA: Diagnosis not present

## 2023-03-17 DIAGNOSIS — M0609 Rheumatoid arthritis without rheumatoid factor, multiple sites: Secondary | ICD-10-CM | POA: Diagnosis not present

## 2023-03-17 DIAGNOSIS — Z6834 Body mass index (BMI) 34.0-34.9, adult: Secondary | ICD-10-CM

## 2023-03-17 LAB — COMPREHENSIVE METABOLIC PANEL
ALT: 13 U/L (ref 0–35)
AST: 20 U/L (ref 0–37)
Albumin: 4.3 g/dL (ref 3.5–5.2)
Alkaline Phosphatase: 40 U/L (ref 39–117)
BUN: 19 mg/dL (ref 6–23)
CO2: 28 meq/L (ref 19–32)
Calcium: 9.5 mg/dL (ref 8.4–10.5)
Chloride: 102 meq/L (ref 96–112)
Creatinine, Ser: 0.89 mg/dL (ref 0.40–1.20)
GFR: 63.55 mL/min (ref >60.00)
Glucose, Bld: 128 mg/dL — ABNORMAL HIGH (ref 70–99)
Potassium: 4.4 meq/L (ref 3.5–5.1)
Sodium: 137 meq/L (ref 135–145)
Total Bilirubin: 0.6 mg/dL (ref 0.2–1.2)
Total Protein: 7.4 g/dL (ref 6.0–8.3)

## 2023-03-17 LAB — HEMOGLOBIN A1C: Hgb A1c MFr Bld: 6.7 % — ABNORMAL HIGH (ref 4.6–6.5)

## 2023-03-17 LAB — LIPID PANEL
Cholesterol: 165 mg/dL (ref 0–200)
HDL: 46 mg/dL (ref >39.00)
LDL Cholesterol: 84 mg/dL (ref 0–99)
NonHDL: 118.92
Total CHOL/HDL Ratio: 4
Triglycerides: 175 mg/dL — ABNORMAL HIGH (ref 0.0–149.0)
VLDL: 35 mg/dL (ref 0.0–40.0)

## 2023-03-17 LAB — CBC
HCT: 42.7 % (ref 36.0–46.0)
Hemoglobin: 14.1 g/dL (ref 12.0–15.0)
MCHC: 33 g/dL (ref 30.0–36.0)
MCV: 92.1 fL (ref 78.0–100.0)
Platelets: 329 10*3/uL (ref 150.0–400.0)
RBC: 4.64 Mil/uL (ref 3.87–5.11)
RDW: 14.8 % (ref 11.5–15.5)
WBC: 4.9 10*3/uL (ref 4.0–10.5)

## 2023-03-17 LAB — VITAMIN D 25 HYDROXY (VIT D DEFICIENCY, FRACTURES): VITD: 40.42 ng/mL (ref 30.00–100.00)

## 2023-03-17 LAB — TSH: TSH: 0.95 u[IU]/mL (ref 0.35–5.50)

## 2023-03-17 MED ORDER — SITAGLIPTIN PHOSPHATE 50 MG PO TABS: 50.0000 mg | ORAL_TABLET | Freq: Every day | ORAL | 1 refills | Status: DC

## 2023-03-17 MED ORDER — METFORMIN HCL 500 MG PO TABS: 500.0000 mg | ORAL_TABLET | Freq: Two times a day (BID) | ORAL | 1 refills | Status: DC

## 2023-03-17 MED ORDER — PRAVASTATIN SODIUM 20 MG PO TABS: 20.0000 mg | ORAL_TABLET | Freq: Every day | ORAL | 1 refills | Status: DC

## 2023-03-17 MED ORDER — FENOFIBRATE 160 MG PO TABS: 160.0000 mg | ORAL_TABLET | Freq: Every day | ORAL | 3 refills | Status: DC

## 2023-03-17 MED ORDER — ZOLPIDEM TARTRATE 5 MG PO TABS: 5.0000 mg | ORAL_TABLET | Freq: Every evening | ORAL | 1 refills | Status: DC | PRN

## 2023-03-17 MED ORDER — LOSARTAN POTASSIUM 25 MG PO TABS: 12.5000 mg | ORAL_TABLET | Freq: Every day | ORAL | 1 refills | Status: DC

## 2023-03-17 NOTE — Patient Instructions (Signed)

## 2023-03-17 NOTE — Progress Notes (Signed)
Patient ID: [name], female  DOB: 1945/06/11, 77 y.o.   MRN: [id number] Patient Care Team    Relationship Specialty Notifications Start End  Natalia Leatherwood, DO PCP - General Family Medicine  10/10/19   Pollyann Savoy, MD Consulting Physician Rheumatology  10/10/19   Charna Elizabeth, MD Consulting Physician Gastroenterology  10/10/19   Renae Fickle, OD  Optometry  10/10/19   Renae Fickle, OD  Optometry  09/23/22     Chief Complaint  Patient presents with   Annual Exam    [name]    [name] is a 77 y.o.  female present for annual preventative physical and combined chronic condition management appointment All past medical history, surgical history, allergies, family history, immunizations, medications and social history were updated in the electronic medical record today. All recent labs, ED visits and hospitalizations within the last year were reviewed.  Health maintenance: Mammogram: completed: 03/17/2023, normal.  Breast center in [place].  > ordered 2025 Immunizations: tdap 03/2015, Influenza flu shot administered today (encouraged yearly), PNA series completed, shingrix completed, Covid series completed Infectious disease screening: Hep C completed DEXA: last completed 04/2022- -1.1 Assistive device: None Assistive device: none Oxygen UXL:KGMW Patient has a Dental home. Hospitalizations/ED visits:reviewed  Hypertension associated with diabetes (HCC)/obesity/statin intolerance/hyperlipidemia Pt reports compliance with pravastatin and fenofibrate.  Blood pressures ranges at home not routinely checked.  Patient denies chest pain, shortness of breath, dizziness or lower extremity edema.  Pt takes a daily baby ASA.       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  Patient is prescribed methotrexate and folate.   Type 2 diabetes mellitus with unspecified  complications (HCC)/cataracts associated with type 2 diabetes Pt reports c compliance with metformin 500 mg twice daily and Januvia 50 mg qd.   She was diagnosed with diabetes when she was around 52.   Patient denies dizziness, hyperglycemic or hypoglycemic events. Patient denies numbness, tingling in the extremities or nonhealing wounds of feet.   vitamin D deficiency She reports she continues to supplement vitamin D 2000 units daily.   Sleep disturbance Patient is prescribed Ambien 5 mg nightly still as needed. She very rarely takes this medication.  When she does she takes the medication she takes one third of a tab.       09/09/2022   10:39 AM 04/15/2022    1:06 PM 07/30/2021   11:07 AM 02/21/2020    8:22 AM 02/15/2020    9:54 AM  Depression screen PHQ 2/9  Decreased Interest 0 0 0 0 0  Down, Depressed, Hopeless 0 0 0 0 0  PHQ - 2 Score 0 0 0 0 0       No data to display                09/09/2022   10:42 AM 05/09/2022    9:13 PM 04/15/2022    1:06 PM 01/30/2022    1:33 PM 10/29/2021    8:54 AM  Fall Risk   Falls in the past year? 1 0 0 0 0  Number falls in past yr: 1 0 0  0  Injury with Fall? 0 0 0  0  Risk for fall due to : Impaired vision  No Fall Risks    Follow up Falls prevention discussed  Falls evaluation completed  Falls evaluation completed     Immunization History  Administered Date(s) Administered  Fluad Quad(high Dose 65+) 01/11/2019, 02/15/2020, 03/06/2022   Fluad Trivalent(High Dose 65+) 03/17/2023   Influenza Whole 04/21/2010   Influenza, High Dose Seasonal PF 02/20/2013, 02/07/2018, 01/30/2021   Influenza,inj,Quad PF,6+ Mos 02/23/2017   Influenza-Unspecified 02/15/2014, 01/30/2016, 02/07/2018, 01/11/2019   PFIZER Comirnaty(Gray Top)Covid-19 Tri-Sucrose Vaccine 10/09/2020   PFIZER(Purple Top)SARS-COV-2 Vaccination 06/08/2019, 07/03/2019, 01/04/2020   PNEUMOCOCCAL CONJUGATE-20 05/13/2021   Pfizer Covid-19 Vaccine Bivalent Booster 8yrs & up  03/24/2021   Pfizer(Comirnaty)Fall Seasonal Vaccine 12 years and older 03/27/2022, 01/06/2023   Pneumococcal Conjugate-13 02/20/2013   Pneumococcal Polysaccharide-23 04/05/2006   Pneumococcal-Unspecified 03/05/2015   Tdap 09/14/2001, 03/05/2015   Tetanus 03/27/2014   Zoster Recombinant(Shingrix) 10/12/2017, 02/07/2018   Zoster, Live 12/07/2013    No results found.  Past Medical History:  Diagnosis Date   Allergy    Arthritis    Bursitis of right hip    Chicken pox    Colon polyp    COPD (chronic obstructive pulmonary disease) (HCC)    CVA (cerebral infarction)    Diabetes mellitus without complication (HCC)    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   Diverticulitis    Family history of colonic polyps 05/16/2020   History of blood transfusion    Hyperlipidemia    Insomnia    Internal hemorrhoids 05/16/2020   Nasal polyps    Obesity    PONV (postoperative nausea and vomiting)    Radial styloid tenosynovitis of left hand 12/21/2019   Rectal bleeding 05/16/2020   Sinusitis, chronic 03/18/2010   Qualifier: Diagnosis of  By: Ninetta Lights MD, Tinnie Gens     Allergies  Allergen Reactions   Lipitor [Atorvastatin Calcium]     Myalgia   Past Surgical History:  Procedure Laterality Date   ABDOMINAL HYSTERECTOMY  2000   APPENDECTOMY     CARDIAC CATHETERIZATION N/A 03/27/2015   Procedure: Left Heart Cath and Coronary Angiography;  Surgeon: Yates Decamp, MD;  Location: [facility] INVASIVE CV LAB;  Service: Cardiovascular;  Laterality: N/A;   CESAREAN SECTION     ELBOW SURGERY Right    FIXATION KYPHOPLASTY LUMBAR SPINE  10/16/2020   L2 and L3 kyphoplasty   HAND TENDON SURGERY Left 02/04/2022   KNEE ARTHROPLASTY     KYPHOPLASTY N/A 10/16/2020   Procedure: LUMBAR 2 AND LUMBAR 3 KYPHOPLASTY;  Surgeon: Estill Bamberg, MD;  Location: [facility] OR;  Service: Orthopedics;  Laterality: N/A;   NASAL SINUS SURGERY     REPLACEMENT TOTAL KNEE Bilateral 2007 2009   TONSILLECTOMY AND ADENOIDECTOMY     WISDOM  TOOTH EXTRACTION     Family History  Problem Relation Age of Onset   Arthritis Mother    Diabetes Mother        Type I   Aneurysm Father    Stroke Father    Arthritis Sister    Arthritis Sister    Down syndrome Son    Diabetes Son        type I   Social History   Social History Narrative   Marital status/children/pets: Married, 1 child.    Education/employment: Automotive engineer educated, retired.   Safety:      -smoke alarm in the home:Yes     - wears seatbelt: Yes     - Feels safe in their relationships: Yes    Allergies as of 03/17/2023       Reactions   Lipitor [atorvastatin Calcium]    Myalgia        Medication List        Accurate as of March 17, 2023 11:59 PM. If you have any questions, ask your nurse or doctor.          STOP taking these medications    fluticasone 50 MCG/ACT nasal spray Commonly known as: FLONASE Stopped by: Felix Pacini   losartan 25 MG tablet Commonly known as: COZAAR Stopped by: Felix Pacini       TAKE these medications    aspirin EC 81 MG tablet Take 81 mg by mouth daily. Swallow whole.   cholecalciferol 25 MCG (1000 UNIT) tablet Commonly known as: VITAMIN D3 Take 1,000 Units by mouth daily.   Comirnaty syringe Generic drug: COVID-19 mRNA vaccine (Pfizer)   fenofibrate 160 MG tablet Take 1 tablet (160 mg total) by mouth daily.   FISH OIL PO Take by mouth daily.   folic acid 1 MG tablet Commonly known as: FOLVITE TAKE 2 TABLETS BY MOUTH EVERY DAY   metFORMIN 500 MG tablet Commonly known as: GLUCOPHAGE Take 1 tablet (500 mg total) by mouth 2 (two) times daily.   methotrexate 2.5 MG tablet Commonly known as: RHEUMATREX TAKE 7 TABLETS BY MOUTH ONE TIME PER WEEK   pravastatin 20 MG tablet Commonly known as: PRAVACHOL Take 1 tablet (20 mg total) by mouth daily.   sitaGLIPtin 50 MG tablet Commonly known as: JANUVIA Take 1 tablet (50 mg total) by mouth daily.   zolpidem 5 MG tablet Commonly known as:  AMBIEN Take 1 tablet (5 mg total) by mouth at bedtime as needed for sleep.        All past medical history, surgical history, allergies, family history, immunizations andmedications were updated in the EMR today and reviewed under the history and medication portions of their EMR.      MM 3D SCREEN BREAST BILATERAL Result Date: 11/26/2020 RECOMMENDATION: Screening mammogram in one year. (Code:SM-B-01Y) BI-RADS CATEGORY  1: Negative. Electronically Signed   By: Bary Richard M.D.   On: 11/26/2020 11:22    ROS 14 pt review of systems performed and negative (unless mentioned in an HPI)  Objective: BP 124/82   Pulse 84   Temp (!) 97.5 F (36.4 C)   Ht 5\' 4"  (1.626 m)   Wt 198 lb (89.8 kg)   SpO2 96%   BMI 33.99 kg/m  Physical Exam Vitals and nursing note reviewed.  Constitutional:      General: She is not in acute distress.    Appearance: Normal appearance. She is not ill-appearing, toxic-appearing or diaphoretic.  HENT:     Head: Normocephalic and atraumatic.     Right Ear: Tympanic membrane, ear canal and external ear normal. There is no impacted cerumen.     Left Ear: Tympanic membrane, ear canal and external ear normal. There is no impacted cerumen.     Nose: No congestion or rhinorrhea.     Mouth/Throat:     Mouth: Mucous membranes are moist.     Pharynx: Oropharynx is clear. No oropharyngeal exudate or posterior oropharyngeal erythema.  Eyes:     General: No scleral icterus.       Right eye: No discharge.        Left eye: No discharge.     Extraocular Movements: Extraocular movements intact.     Conjunctiva/sclera: Conjunctivae normal.     Pupils: Pupils are equal, round, and reactive to light.  Cardiovascular:     Rate and Rhythm: Normal rate and regular rhythm.     Pulses: Normal pulses.     Heart sounds: Normal heart sounds. No murmur heard.  No friction rub. No gallop.  Pulmonary:     Effort: Pulmonary effort is normal. No respiratory distress.     Breath  sounds: Normal breath sounds. No stridor. No wheezing, rhonchi or rales.  Chest:     Chest wall: No tenderness.  Abdominal:     General: Abdomen is flat. Bowel sounds are normal. There is no distension.     Palpations: Abdomen is soft. There is no mass.     Tenderness: There is no abdominal tenderness. There is no right CVA tenderness, left CVA tenderness, guarding or rebound.     Hernia: No hernia is present.  Musculoskeletal:        General: No swelling, tenderness or deformity. Normal range of motion.     Cervical back: Normal range of motion and neck supple. No rigidity or tenderness.     Right lower leg: No edema.     Left lower leg: No edema.  Lymphadenopathy:     Cervical: No cervical adenopathy.  Skin:    General: Skin is warm and dry.     Coloration: Skin is not jaundiced or pale.     Findings: No bruising, erythema, lesion or rash.  Neurological:     General: No focal deficit present.     Mental Status: She is alert and oriented to person, place, and time. Mental status is at baseline.     Cranial Nerves: No cranial nerve deficit.     Sensory: No sensory deficit.     Motor: No weakness.     Coordination: Coordination normal.     Gait: Gait normal.     Deep Tendon Reflexes: Reflexes normal.  Psychiatric:        Mood and Affect: Mood normal.        Behavior: Behavior normal.        Thought Content: Thought content normal.        Judgment: Judgment normal.     Diabetic Foot Exam - Simple   Simple Foot Form Diabetic Foot exam was performed with the following findings: Yes 03/17/2023  9:05 AM  Visual Inspection No deformities, no ulcerations, no other skin breakdown bilaterally: Yes Sensation Testing Intact to touch and monofilament testing bilaterally: Yes Pulse Check Posterior Tibialis and Dorsalis pulse intact bilaterally: Yes Comments     Assessment/plan: [name] is a 77 y.o. female present for annual preventative physical exam and combined chronic  condition management Hypertension/Hyperlipidema/morbid obesity Stable- having dizziness and low BP. DISContinue losartan 12.5 mg daily Continue pravastatin -Continue omega-3  Continue fenofibrate. CBC, CMP and lipids collected today  Osteopenia of multiple sites/vitamin D deficiency Stable Continue vitamin D 2000 units daily> Labs up-to-date Bone density due 2030   diabetes (HCC)/obesity/cataracts associated with diabetes diabetes associated with hyperlipidemia Stable Continue metformin 500 mg twice daily for now> had SE to higher dose.  Continue Januvia 50 mg daily Continue routine exercise and low-sodium diet. Diabetic diet encouraged PNA series: Completed  PNA20 Flu shot: completed/ today(recommneded yearly) Foot exam: Completed 03/17/2023 Eye exam: Completed-08/28/2022 Dr. Sharl Ma A1c:7.4>>7.6>6.5>6.4> 6.5 > 6.2> 6.1>6.2>collected A1c collected today    Rheumatoid arthritis of multiple sites with negative rheumatoid factor (HCC) Prescribed methotrexate and folate managed by rheumatology.Stable.    Sleep disturbance: Stable Continue Ambien half tab as needed-rarely uses medication. [place] controlled substance database reviewed 03/22/23  Vitamin D insufficiency - VITAMIN D 25 Hydroxy (Vit-D Deficiency, Fractures) Influenza vaccine needed - Flu Vaccine Trivalent High Dose (Fluad) Breast cancer screening by mammogram -  MM 3D SCREENING MAMMOGRAM BILATERAL BREAST; Future  Routine general medical examination at a health care facility Patient was encouraged to exercise greater than 150 minutes a week. Patient was encouraged to choose a diet filled with fresh fruits and vegetables, and lean meats. AVS provided to patient today for education/recommendation on gender specific health and safety maintenance. Mammogram: completed: 04/15/2022, normal.  Breast center in [place].  > ordered Immunizations: tdap 03/2015, Influenza administered today(encouraged yearly),  PNA series completed, shingrix completed, Covid series completed Infectious disease screening: Hep C completed DEXA: last completed 04/2022- -1.1  Return in about 24 weeks (around 09/01/2023) for Routine chronic condition follow-up.  Orders Placed This Encounter  Procedures   MM 3D SCREENING MAMMOGRAM BILATERAL BREAST   Flu Vaccine Trivalent High Dose (Fluad)   CBC   Comprehensive metabolic panel   Hemoglobin A1c   TSH   Lipid panel   VITAMIN D 25 Hydroxy (Vit-D Deficiency, Fractures)   Meds ordered this encounter  Medications   fenofibrate 160 MG tablet    Sig: Take 1 tablet (160 mg total) by mouth daily.    Dispense:  90 tablet    Refill:  3   DISCONTD: losartan (COZAAR) 25 MG tablet    Sig: Take 0.5 tablets (12.5 mg total) by mouth daily.    Dispense:  45 tablet    Refill:  1   metFORMIN (GLUCOPHAGE) 500 MG tablet    Sig: Take 1 tablet (500 mg total) by mouth 2 (two) times daily.    Dispense:  180 tablet    Refill:  1   pravastatin (PRAVACHOL) 20 MG tablet    Sig: Take 1 tablet (20 mg total) by mouth daily.    Dispense:  90 tablet    Refill:  1   sitaGLIPtin (JANUVIA) 50 MG tablet    Sig: Take 1 tablet (50 mg total) by mouth daily.    Dispense:  90 tablet    Refill:  1   zolpidem (AMBIEN) 5 MG tablet    Sig: Take 1 tablet (5 mg total) by mouth at bedtime as needed for sleep.    Dispense:  90 tablet    Refill:  1   Referral Orders  No referral(s) requested today     Note is dictated utilizing voice recognition software. Although note has been proof read prior to signing, occasional typographical errors still can be missed. If any questions arise, please do not hesitate to call for verification.  Electronically signed by: Felix Pacini, DO  Primary Care- [place]

## 2023-03-19 ENCOUNTER — Other Ambulatory Visit: Payer: Self-pay | Admitting: Family Medicine

## 2023-03-19 NOTE — Telephone Encounter (Signed)
Faxed

## 2023-03-19 NOTE — Telephone Encounter (Signed)
Completed preoperative risk assessment form for patient.  Placed on CMA work basket. They are not requiring anything specific for the risk assessment from [place], and she was here for her physical 2 days ago.  Therefore she does not need an additional appointment to complete the form.  Please cancel the appointment on 03/23/2023 for her.

## 2023-03-23 ENCOUNTER — Other Ambulatory Visit: Payer: Self-pay | Admitting: Orthopaedic Surgery

## 2023-03-23 ENCOUNTER — Ambulatory Visit: Payer: PPO | Admitting: Family Medicine

## 2023-03-27 ENCOUNTER — Other Ambulatory Visit: Payer: Self-pay | Admitting: Physician Assistant

## 2023-03-29 DIAGNOSIS — M1611 Unilateral primary osteoarthritis, right hip: Secondary | ICD-10-CM | POA: Diagnosis not present

## 2023-03-29 DIAGNOSIS — M25651 Stiffness of right hip, not elsewhere classified: Secondary | ICD-10-CM | POA: Diagnosis not present

## 2023-03-29 DIAGNOSIS — R262 Difficulty in walking, not elsewhere classified: Secondary | ICD-10-CM | POA: Diagnosis not present

## 2023-03-29 NOTE — Telephone Encounter (Signed)
Last Fill: 12/28/2022  Labs: 03/17/2023 Glucose 128  Next Visit: 05/25/2023  Last Visit: 12/23/2022  DX: Rheumatoid arthritis of multiple sites with negative rheumatoid factor   Current Dose per office note 12/23/2022: Methotrexate 7 tablets once weekly   Okay to refill Methotrexate?

## 2023-04-06 NOTE — Progress Notes (Signed)
COVID Vaccine received:  []  No [x]  Yes Date of any COVID positive Test in last 90 days:  PCP - Felix Pacini, DO  Cardiologist -  Rheumatology- Pollyann Savoy, MD   Chest x-ray - 03-27-2015  2v  Epic EKG -10-14-2020  Epic   Stress Test -  ECHO - 2011  Epic Cardiac Cath - LHC / cors  03-27-2015 by Dr. Jacinto Halim  PCR screen: [x]  Ordered & Completed []   No Order but Needs PROFEND     []   N/A for this surgery  Surgery Plan:  [x]  Ambulatory   []  Outpatient in bed  []  Admit Anesthesia:    []  General  [x]  Spinal  []   Choice []   MAC  Pacemaker / ICD device []  No []  Yes   Spinal Cord Stimulator:[]  No []  Yes       History of Sleep Apnea? [x]  No []  Yes   CPAP used?- [x]  No []  Yes    Does the patient monitor blood sugar?   []  N/A   []  No []  Yes  Patient has: []  NO Hx DM   []  Pre-DM   []  DM1  [x]   DM2 Last A1c was:6.7  on 03-17-2023     Does patient have a [employer] or Dexacom? []  No []  Yes   Fasting Blood Sugar Ranges-  Checks Blood Sugar _____ times a day  Diabetic medications/ instructions:   Metformin 500mg  bid     Hold DOS Silagliptin (Januvia)-  50 mg tab daily,   Hold DOS  Blood Thinner / Instructions:  none Aspirin Instructions: ASA 81 mg   hold x 7 days  ERAS Protocol Ordered: []  No  [x]  Yes PRE-SURGERY []  ENSURE  [x]  G2   Patient is to be NPO after: 09:15  Dental hx: []  Dentures:  []  N/A      []  Bridge or Partial:                   []  Loose or Damaged teeth:   Comments: Patient was given the 5 CHG shower / bath instructions for THA surgery along with 2 bottles of the CHG soap. Patient will start this on:  Friday 04-16-2023   All questions were asked and answered, Patient voiced understanding of this process.   Activity level: Patient is able / unable to climb a flight of stairs without difficulty; []  No CP  []  No SOB, but would have ___   Patient can / can not perform ADLs without assistance.   Anesthesia review: DM2, HTN, CAD- had LHC 2016, RA, hx CVA, PONV, some  days smoker.   Patient denies shortness of breath, fever, cough and chest pain at PAT appointment.  Patient verbalized understanding and agreement to the Pre-Surgical Instructions that were given to them at this PAT appointment. Patient was also educated of the need to review these PAT instructions again prior to her surgery.I reviewed the appropriate phone numbers to call if they have any and questions or concerns.

## 2023-04-06 NOTE — Patient Instructions (Addendum)
SURGICAL WAITING ROOM VISITATION Patients having surgery or a procedure may have no more than 2 support people in the waiting area - these visitors may rotate in the visitor waiting room.   Due to an increase in RSV and influenza rates and associated hospitalizations, children ages 68 and under may not visit patients in [facility] hospitals. If the patient needs to stay at the hospital during part of their recovery, the visitor guidelines for inpatient rooms apply.  PRE-OP VISITATION  Pre-op nurse will coordinate an appropriate time for 1 support person to accompany the patient in pre-op.  This support person may not rotate.  This visitor will be contacted when the time is appropriate for the visitor to come back in the pre-op area.  Please refer to the [facility] website for the visitor guidelines for Inpatients (after your surgery is over and you are in a regular room).  You are not required to quarantine at this time prior to your surgery. However, you must do this: Hand Hygiene often Do NOT share personal items Notify your provider if you are in close contact with someone who has COVID or you develop fever 100.4 or greater, new onset of sneezing, cough, sore throat, shortness of breath or body aches.  If you test positive for Covid or have been in contact with anyone that has tested positive in the last 10 days please notify you surgeon.    Your procedure is scheduled on:  Tuesday  April 20, 2023  Report to [facility] Main Entrance: [facility]sen entrance where the [employer] is available.   Report to admitting at: 09:45 AM  Call this number if you have any questions or problems the morning of surgery [phone]  Do not eat food after Midnight the night prior to your surgery/procedure.  After Midnight you may have the following liquids until  09:15 AM  DAY OF SURGERY  Clear Liquid Diet Water Black Coffee (sugar ok, NO MILK/CREAM OR CREAMERS)  Tea (sugar ok, NO  MILK/CREAM OR CREAMERS) regular and decaf                             Plain Jell-O  with no fruit (NO RED)                                           Fruit ices (not with fruit pulp, NO RED)                                     Popsicles (NO RED)                                                                  Juice: NO CITRUS JUICES: only apple, WHITE grape, WHITE cranberry Sports drinks like Gatorade or Powerade (NO RED)                   The day of surgery:  Drink ONE (1) Pre-Surgery G2 at 09:15  AM the morning of surgery. Drink in one sitting.  Do not sip.  This drink was given to you during your hospital pre-op appointment visit. Nothing else to drink after completing the Pre-Surgery G2 : No candy, chewing gum or throat lozenges.    FOLLOW ANY ADDITIONAL PRE OP INSTRUCTIONS YOU RECEIVED FROM YOUR SURGEON'S OFFICE!!!   Oral Hygiene is also important to reduce your risk of infection.        Remember - BRUSH YOUR TEETH THE MORNING OF SURGERY WITH YOUR REGULAR TOOTHPASTE  Do NOT smoke after Midnight the night before surgery.  STOP TAKING all Vitamins, Herbs and supplements 1 week before your  surgery.  ASPIRIN-  Stop taking your aspirin 5-7 days prior to surgery  Diabetic Medications:  Metformin 500mg  bid  Day BEFORE surgery, take as usual.  DO NOT TAKE ON THE MORNING OF SURGERY Silagliptin (Januvia)-  50 mg tab daily,   Day BEFORE surgery, take as usual.  DO NOT TAKE ON THE MORNING OF SURGERY  Take ONLY these medicines the morning of surgery with A SIP OF WATER: you may take Tylenol if needed for pain.                    You may not have any metal on your body including hair pins, jewelry, and body piercing Do not wear make-up, lotions, powders, perfumes, or deodorant  Do not wear nail polish including gel and S&S, artificial / acrylic nails, or any other type of covering on natural nails including finger and toenails. If you have artificial nails, gel coating, etc., that needs  to be removed by a nail salon, Please have this removed prior to surgery. Not doing so may mean that your surgery could be cancelled or delayed if the Surgeon or anesthesia staff feels like they are unable to monitor you safely.   Do not shave 48 hours prior to surgery to avoid nicks in your skin which may contribute to postoperative infections.   Contacts, Hearing Aids, dentures or bridgework may not be worn into surgery. DENTURES WILL BE REMOVED PRIOR TO SURGERY PLEASE DO NOT APPLY "Poly grip" OR ADHESIVES!!!  You may bring a small overnight bag with you on the day of surgery, only pack items that are not valuable. [facility] IS NOT RESPONSIBLE   FOR VALUABLES THAT ARE LOST OR STOLEN.   Patients discharged on the day of surgery will not be allowed to drive home.  Someone NEEDS to stay with you for the first 24 hours after anesthesia.  Do not bring your home medications to the hospital. The Pharmacy will dispense medications listed on your medication list to you during your admission in the Hospital.  Please read over the following fact sheets you were given: IF YOU HAVE QUESTIONS ABOUT YOUR PRE-OP INSTRUCTIONS, PLEASE CALL [phone].    Pre-operative 5 CHG Bath Instructions   You can play a key role in reducing the risk of infection after surgery. Your skin needs to be as free of germs as possible. You can reduce the number of germs on your skin by washing with CHG (chlorhexidine gluconate) soap before surgery. CHG is an antiseptic soap that kills germs and continues to kill germs even after washing.   DO NOT use if you have an allergy to chlorhexidine/CHG or antibacterial soaps. If your skin becomes reddened or irritated, stop using the CHG and notify one of our RNs at [phone]  Please shower with the CHG soap starting 4 days before surgery using the following schedule: START SHOWERS ON  FRIDAY   April 16, 2023                                                                                                                                                                               Please keep in mind the following:  DO NOT shave, including legs and underarms, starting the day of your first shower.   You may shave your face at any point before/day of surgery.   Place clean sheets on your bed the day you start using CHG soap. Use a clean washcloth (not used since being washed) for each shower. DO NOT sleep with pets once you start using the CHG.   CHG Shower Instructions:  If you choose to wash your hair and private area, wash first with your normal shampoo/soap.  After you use shampoo/soap, rinse your hair and body thoroughly to remove shampoo/soap residue.  Turn the water OFF and apply about 3 tablespoons (45 ml) of CHG soap to a CLEAN washcloth.  Apply CHG soap ONLY FROM YOUR NECK DOWN TO YOUR TOES (washing for 3-5 minutes)  DO NOT use CHG soap on face, private areas, open wounds, or sores.  Pay special attention to the area where your surgery is being performed.  If you are having back surgery, having someone wash your back for you may be helpful.  Wait 2 minutes after CHG soap is applied, then you may rinse off the CHG soap.  Pat dry with a clean towel  Put on clean clothes/pajamas   If you choose to wear lotion, please use ONLY the CHG-compatible lotions on the back of this paper.     Additional instructions for the day of surgery: DO NOT APPLY any lotions, deodorants, cologne, or perfumes.   Put on clean/comfortable clothes.  Brush your teeth.  Ask your nurse before applying any prescription medications to the skin.      CHG Compatible Lotions   Aveeno Moisturizing lotion  Cetaphil Moisturizing Cream  Cetaphil Moisturizing Lotion  Clairol Herbal Essence Moisturizing Lotion, Dry Skin  Clairol Herbal Essence Moisturizing Lotion, Extra Dry Skin  Clairol Herbal Essence Moisturizing Lotion, Normal Skin  Curel Age Defying Therapeutic Moisturizing  Lotion with Alpha Hydroxy  Curel Extreme Care Body Lotion  Curel Soothing Hands Moisturizing Hand Lotion  Curel Therapeutic Moisturizing Cream, Fragrance-Free  Curel Therapeutic Moisturizing Lotion, Fragrance-Free  Curel Therapeutic Moisturizing Lotion, Original Formula  Eucerin Daily Replenishing Lotion  Eucerin Dry Skin Therapy Plus Alpha Hydroxy Crme  Eucerin Dry Skin Therapy Plus Alpha Hydroxy Lotion  Eucerin Original Crme  Eucerin Original Lotion  Eucerin Plus Crme Eucerin Plus Lotion  Eucerin TriLipid Replenishing Lotion  Keri Anti-Bacterial Hand Lotion  Keri Deep Conditioning  Original Lotion Dry Skin Formula Softly Scented  Keri Deep Conditioning Original Lotion, Fragrance Free Sensitive Skin Formula  Keri Lotion Fast Absorbing Fragrance Free Sensitive Skin Formula  Keri Lotion Fast Absorbing Softly Scented Dry Skin Formula  Keri Original Lotion  Keri Skin Renewal Lotion Keri Silky Smooth Lotion  Keri Silky Smooth Sensitive Skin Lotion  Nivea Body Creamy Conditioning Oil  Nivea Body Extra Enriched Lotion  Nivea Body Original Lotion  Nivea Body Sheer Moisturizing Lotion Nivea Crme  Nivea Skin Firming Lotion  NutraDerm 30 Skin Lotion  NutraDerm Skin Lotion  NutraDerm Therapeutic Skin Cream  NutraDerm Therapeutic Skin Lotion  ProShield Protective Hand Cream  Provon moisturizing lotion   FAILURE TO FOLLOW THESE INSTRUCTIONS MAY RESULT IN THE CANCELLATION OF YOUR SURGERY  PATIENT SIGNATURE_________________________________  NURSE SIGNATURE__________________________________  ________________________________________________________________________       Rogelia Mire    An incentive spirometer is a tool that can help keep your lungs clear and active. This tool measures how well you are filling your lungs with each breath. Taking long deep breaths may help reverse or decrease the chance of developing breathing (pulmonary) problems (especially infection)  following: A long period of time when you are unable to move or be active. BEFORE THE PROCEDURE  If the spirometer includes an indicator to show your best effort, your nurse or respiratory therapist will set it to a desired goal. If possible, sit up straight or lean slightly forward. Try not to slouch. Hold the incentive spirometer in an upright position. INSTRUCTIONS FOR USE  Sit on the edge of your bed if possible, or sit up as far as you can in bed or on a chair. Hold the incentive spirometer in an upright position. Breathe out normally. Place the mouthpiece in your mouth and seal your lips tightly around it. Breathe in slowly and as deeply as possible, raising the piston or the ball toward the top of the column. Hold your breath for 3-5 seconds or for as long as possible. Allow the piston or ball to fall to the bottom of the column. Remove the mouthpiece from your mouth and breathe out normally. Rest for a few seconds and repeat Steps 1 through 7 at least 10 times every 1-2 hours when you are awake. Take your time and take a few normal breaths between deep breaths. The spirometer may include an indicator to show your best effort. Use the indicator as a goal to work toward during each repetition. After each set of 10 deep breaths, practice coughing to be sure your lungs are clear. If you have an incision (the cut made at the time of surgery), support your incision when coughing by placing a pillow or rolled up towels firmly against it. Once you are able to get out of bed, walk around indoors and cough well. You may stop using the incentive spirometer when instructed by your caregiver.  RISKS AND COMPLICATIONS Take your time so you do not get dizzy or light-headed. If you are in pain, you may need to take or ask for pain medication before doing incentive spirometry. It is harder to take a deep breath if you are having pain. AFTER USE Rest and breathe slowly and easily. It can be helpful to  keep track of a log of your progress. Your caregiver can provide you with a simple table to help with this. If you are using the spirometer at home, follow these instructions: SEEK MEDICAL CARE IF:  You are having difficultly using the spirometer.  You have trouble using the spirometer as often as instructed. Your pain medication is not giving enough relief while using the spirometer. You develop fever of 100.5 F (38.1 C) or higher.                                                                                                    SEEK IMMEDIATE MEDICAL CARE IF:  You cough up bloody sputum that had not been present before. You develop fever of 102 F (38.9 C) or greater. You develop worsening pain at or near the incision site. MAKE SURE YOU:  Understand these instructions. Will watch your condition. Will get help right away if you are not doing well or get worse. Document Released: 08/31/2006 Document Revised: 07/13/2011 Document Reviewed: 11/01/2006 [facility] Patient Information 2014 [place], [place].     WHAT IS A BLOOD TRANSFUSION? Blood Transfusion Information  A transfusion is the replacement of blood or some of its parts. Blood is made up of multiple cells which provide different functions. Red blood cells carry oxygen and are used for blood loss replacement. White blood cells fight against infection. Platelets control bleeding. Plasma helps clot blood. Other blood products are available for specialized needs, such as hemophilia or other clotting disorders. BEFORE THE TRANSFUSION  Who gives blood for transfusions?  Healthy volunteers who are fully evaluated to make sure their blood is safe. This is blood bank blood. Transfusion therapy is the safest it has ever been in the practice of medicine. Before blood is taken from a donor, a complete history is taken to make sure that person has no history of diseases nor engages in risky social behavior (examples are intravenous drug use  or sexual activity with multiple partners). The donor's travel history is screened to minimize risk of transmitting infections, such as malaria. The donated blood is tested for signs of infectious diseases, such as HIV and hepatitis. The blood is then tested to be sure it is compatible with you in order to minimize the chance of a transfusion reaction. If you or a relative donates blood, this is often done in anticipation of surgery and is not appropriate for emergency situations. It takes many days to process the donated blood. RISKS AND COMPLICATIONS Although transfusion therapy is very safe and saves many lives, the main dangers of transfusion include:  Getting an infectious disease. Developing a transfusion reaction. This is an allergic reaction to something in the blood you were given. Every precaution is taken to prevent this. The decision to have a blood transfusion has been considered carefully by your caregiver before blood is given. Blood is not given unless the benefits outweigh the risks. AFTER THE TRANSFUSION Right after receiving a blood transfusion, you will usually feel much better and more energetic. This is especially true if your red blood cells have gotten low (anemic). The transfusion raises the level of the red blood cells which carry oxygen, and this usually causes an energy increase. The nurse administering the transfusion will monitor you carefully for complications. HOME CARE INSTRUCTIONS  No special instructions are needed after a transfusion.  You may find your energy is better. Speak with your caregiver about any limitations on activity for underlying diseases you may have. SEEK MEDICAL CARE IF:  Your condition is not improving after your transfusion. You develop redness or irritation at the intravenous (IV) site. SEEK IMMEDIATE MEDICAL CARE IF:  Any of the following symptoms occur over the next 12 hours: Shaking chills. You have a temperature by mouth above 102 F (38.9  C), not controlled by medicine. Chest, back, or muscle pain. People around you feel you are not acting correctly or are confused. Shortness of breath or difficulty breathing. Dizziness and fainting. You get a rash or develop hives. You have a decrease in urine output. Your urine turns a dark color or changes to pink, red, or brown. Any of the following symptoms occur over the next 10 days: You have a temperature by mouth above 102 F (38.9 C), not controlled by medicine. Shortness of breath. Weakness after normal activity. The white part of the eye turns yellow (jaundice). You have a decrease in the amount of urine or are urinating less often. Your urine turns a dark color or changes to pink, red, or brown. Document Released: 04/17/2000 Document Revised: 07/13/2011 Document Reviewed: 12/05/2007 [facility] Patient Information 2014 [place], [place].  _______________________________________________________________________

## 2023-04-07 ENCOUNTER — Other Ambulatory Visit: Payer: Self-pay

## 2023-04-07 ENCOUNTER — Encounter (HOSPITAL_COMMUNITY): Payer: Self-pay

## 2023-04-07 ENCOUNTER — Telehealth: Payer: Self-pay | Admitting: *Deleted

## 2023-04-07 ENCOUNTER — Encounter (HOSPITAL_COMMUNITY)
Admission: RE | Admit: 2023-04-07 | Discharge: 2023-04-07 | Disposition: A | Discharge status: Home / Self Care | Payer: PPO | Source: Ambulatory Visit | Attending: Orthopaedic Surgery | Admitting: Orthopaedic Surgery

## 2023-04-07 VITALS — BP 124/74 | HR 84 | Temp 98.0°F | Resp 18 | Ht 64.0 in | Wt 194.0 lb

## 2023-04-07 DIAGNOSIS — Z8673 Personal history of transient ischemic attack (TIA), and cerebral infarction without residual deficits: Secondary | ICD-10-CM | POA: Diagnosis not present

## 2023-04-07 DIAGNOSIS — E1169 Type 2 diabetes mellitus with other specified complication: Secondary | ICD-10-CM | POA: Diagnosis not present

## 2023-04-07 DIAGNOSIS — J449 Chronic obstructive pulmonary disease, unspecified: Secondary | ICD-10-CM | POA: Diagnosis not present

## 2023-04-07 DIAGNOSIS — Z79631 Long term (current) use of antimetabolite agent: Secondary | ICD-10-CM | POA: Diagnosis not present

## 2023-04-07 DIAGNOSIS — Z01818 Encounter for other preprocedural examination: Secondary | ICD-10-CM | POA: Insufficient documentation

## 2023-04-07 DIAGNOSIS — E785 Hyperlipidemia, unspecified: Secondary | ICD-10-CM | POA: Insufficient documentation

## 2023-04-07 DIAGNOSIS — M069 Rheumatoid arthritis, unspecified: Secondary | ICD-10-CM | POA: Insufficient documentation

## 2023-04-07 DIAGNOSIS — I10 Essential (primary) hypertension: Secondary | ICD-10-CM | POA: Insufficient documentation

## 2023-04-07 DIAGNOSIS — M1611 Unilateral primary osteoarthritis, right hip: Secondary | ICD-10-CM | POA: Diagnosis not present

## 2023-04-07 DIAGNOSIS — F17210 Nicotine dependence, cigarettes, uncomplicated: Secondary | ICD-10-CM | POA: Diagnosis not present

## 2023-04-07 DIAGNOSIS — I2510 Atherosclerotic heart disease of native coronary artery without angina pectoris: Secondary | ICD-10-CM | POA: Diagnosis not present

## 2023-04-07 DIAGNOSIS — Z6833 Body mass index (BMI) 33.0-33.9, adult: Secondary | ICD-10-CM | POA: Insufficient documentation

## 2023-04-07 DIAGNOSIS — Z7984 Long term (current) use of oral hypoglycemic drugs: Secondary | ICD-10-CM | POA: Insufficient documentation

## 2023-04-07 DIAGNOSIS — E669 Obesity, unspecified: Secondary | ICD-10-CM | POA: Diagnosis not present

## 2023-04-07 HISTORY — DX: Atherosclerotic heart disease of native coronary artery without angina pectoris: I25.10

## 2023-04-07 HISTORY — DX: Anxiety disorder, unspecified: F41.9

## 2023-04-07 HISTORY — DX: Essential (primary) hypertension: I10

## 2023-04-07 LAB — TYPE AND SCREEN
ABO/RH(D): AB POS
Antibody Screen: NEGATIVE

## 2023-04-07 LAB — COMPREHENSIVE METABOLIC PANEL
ALT: 16 U/L (ref 0–44)
AST: 19 U/L (ref 15–41)
Albumin: 4 g/dL (ref 3.5–5.0)
Alkaline Phosphatase: 36 U/L — ABNORMAL LOW (ref 38–126)
Anion gap: 9 (ref 5–15)
BUN: 27 mg/dL — ABNORMAL HIGH (ref 8–23)
CO2: 23 mmol/L (ref 22–32)
Calcium: 9 mg/dL (ref 8.9–10.3)
Chloride: 105 mmol/L (ref 98–111)
Creatinine, Ser: 0.91 mg/dL (ref 0.44–1.00)
GFR, Estimated: >60 mL/min (ref >60)
Glucose, Bld: 89 mg/dL (ref 70–99)
Potassium: 4.4 mmol/L (ref 3.5–5.1)
Sodium: 137 mmol/L (ref 135–145)
Total Bilirubin: 0.4 mg/dL (ref <1.2)
Total Protein: 7.4 g/dL (ref 6.5–8.1)

## 2023-04-07 LAB — CBC
HCT: 42 % (ref 36.0–46.0)
Hemoglobin: 13.6 g/dL (ref 12.0–15.0)
MCH: 30.6 pg (ref 26.0–34.0)
MCHC: 32.4 g/dL (ref 30.0–36.0)
MCV: 94.4 fL (ref 80.0–100.0)
Platelets: 326 10*3/uL (ref 150–400)
RBC: 4.45 MIL/uL (ref 3.87–5.11)
RDW: 13.9 % (ref 11.5–15.5)
WBC: 6.2 10*3/uL (ref 4.0–10.5)
nRBC: 0 % (ref 0.0–0.2)

## 2023-04-07 LAB — SURGICAL PCR SCREEN
MRSA, PCR: NEGATIVE
Staphylococcus aureus: NEGATIVE

## 2023-04-07 LAB — GLUCOSE, CAPILLARY: Glucose-Capillary: 95 mg/dL (ref 70–99)

## 2023-04-07 NOTE — Telephone Encounter (Signed)
Patient contacted the office and left a message she is having a hip replacement and is on MTX. Patient would like to know what she needs to do.   Attempted to contact the patient and left message for patient to call the office.   Patient returned call to the office and left message.  Returned call to the patient and advised patient she will need to hold her MTX for 1 week prior to surgery and for 2 weeks after surgery. Patient advised she will need clearance from her surgeon to restart her medication. Patient expressed understanding.

## 2023-04-09 NOTE — Anesthesia Preprocedure Evaluation (Addendum)
Anesthesia Evaluation  Patient identified by MRN, date of birth, ID band Patient awake    Reviewed: Allergy & Precautions, NPO status , Patient's Chart, lab work & pertinent test results  History of Anesthesia Complications (+) PONV and history of anesthetic complications  Airway Mallampati: II  TM Distance: >3 FB Neck ROM: Full    Dental  (+) Teeth Intact, Dental Advisory Given   Pulmonary COPD, Current Smoker and Patient abstained from smoking.   breath sounds clear to auscultation       Cardiovascular hypertension, + CAD   Rhythm:Regular Rate:Normal     Neuro/Psych   Anxiety     negative neurological ROS     GI/Hepatic negative GI ROS, Neg liver ROS,,,  Endo/Other  diabetes    Renal/GU negative Renal ROS     Musculoskeletal  (+) Arthritis ,    Abdominal   Peds  Hematology negative hematology ROS (+)   Anesthesia Other Findings   Reproductive/Obstetrics                             Anesthesia Physical Anesthesia Plan  ASA: 3  Anesthesia Plan: Spinal   Post-op Pain Management:    Induction: Intravenous  PONV Risk Score and Plan: 3 and Ondansetron, Propofol infusion and Dexamethasone  Airway Management Planned: Natural Airway and Nasal Cannula  Additional Equipment:   Intra-op Plan:   Post-operative Plan:   Informed Consent: I have reviewed the patients History and Physical, chart, labs and discussed the procedure including the risks, benefits and alternatives for the proposed anesthesia with the patient or authorized representative who has indicated his/her understanding and acceptance.       Plan Discussed with: CRNA  Anesthesia Plan Comments: (See PAT note from 12/4 by Sherlie Ban PA-C  Lab Results      Component                Value               Date                      WBC                      6.2                 04/07/2023                HGB                       13.6                04/07/2023                HCT                      42.0                04/07/2023                MCV                      94.4                04/07/2023                PLT  326                 04/07/2023           )        Anesthesia Quick Evaluation

## 2023-04-09 NOTE — Progress Notes (Signed)
Case: [id number] Date/Time: 04/20/23 1200   Procedure: TOTAL HIP ARTHROPLASTY ANTERIOR APPROACH (Right: Hip)   Anesthesia type: Spinal   Pre-op diagnosis: RIGHT HIP DEGENERATIVE JOINT DISEASE   Location: [facility] ROOM 06 / [facility]   Surgeons: Marcene Corning, MD       DISCUSSION: [name] is a 77 yo female who presents to PAT prior to surgery above. PMH of every day smoking, HTN, CAD, COPD, CVA, DM, obesity (BMI 33), RA, anxiety.  Prior anesthesia complications include PONV  Patient follows with PCP for chronic medical issues. Last seen on 11/13. All issues stable. BP controlled. Last A1c was 6.7 on 11/13. Pt has diagnosis of COPD but does not see pulmonology or use inhalers. No recent exacerbations per chart review.  Patient had remote cardiac w/u in 2016. LHC showed mild-mod CAD in the RCA and LAD and medical management was recommended. She has not needed any ongoing cardiac f/u after this.   VS: BP 124/74 Comment: right arm sitting  Pulse 84   Temp 36.7 C (Oral)   Resp 18   Ht 5\' 4"  (1.626 m)   Wt 88 kg   SpO2 97%   BMI 33.30 kg/m   PROVIDERS: PCP - Felix Pacini, DO  Rheumatology- Pollyann Savoy, MD    LABS: Labs reviewed: Acceptable for surgery. (all labs ordered are listed, but only abnormal results are displayed)  Labs Reviewed  COMPREHENSIVE METABOLIC PANEL - Abnormal; Notable for the following components:      Result Value   BUN 27 (*)    Alkaline Phosphatase 36 (*)    All other components within normal limits  SURGICAL PCR SCREEN  CBC  GLUCOSE, CAPILLARY  TYPE AND SCREEN     IMAGES:   EKG 04/07/23  Normal sinus rhythm, rate 78 Low voltage QRS  CV:  Left heart cath 03/27/2015:  Mid RCA lesion, 40% stenosed. Mid LAD lesion, 10% stenosed. The left ventricular systolic function is normal. Recommendation: Risk factor modification, smoking cessation is indicated. I'll monitor the EKG in the morning, will also follow-up on serum troponins. Smoking  cessation discussed with the patient.  Echo 04/01/2010 (report scanned in epic):  Study Conclusions  LV: The cavity size was normal. There was mild concentric hypertrophy. Systolic function was normal. The estimated EF was in the range of 50-55%.  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   Diverticulitis    Family history of colonic polyps 05/16/2020   History of blood transfusion    Hyperlipidemia    Hypertension    Insomnia    Internal hemorrhoids 05/16/2020   Nasal polyps    Obesity    PONV (postoperative nausea and vomiting)    Radial styloid tenosynovitis of left hand 12/21/2019   Rectal bleeding 05/16/2020   Sinusitis, chronic 03/18/2010   Qualifier: Diagnosis of  By: Ninetta Lights MD, Tinnie Gens      Past Surgical History:  Procedure Laterality Date   ABDOMINAL HYSTERECTOMY  2000   APPENDECTOMY     CARDIAC CATHETERIZATION N/A 03/27/2015   Procedure: Left Heart Cath and Coronary  Angiography;  Surgeon: Yates Decamp, MD;  Location: [facility] INVASIVE CV LAB;  Service: Cardiovascular;  Laterality: N/A;   CESAREAN SECTION     ELBOW SURGERY Right    FIXATION KYPHOPLASTY LUMBAR SPINE  10/16/2020   L2 and L3 kyphoplasty   HAND TENDON SURGERY Left 02/04/2022   KYPHOPLASTY N/A 10/16/2020   Procedure: LUMBAR 2 AND LUMBAR 3 KYPHOPLASTY;  Surgeon: Estill Bamberg, MD;  Location: [facility] OR;  Service: Orthopedics;  Laterality: N/A;   NASAL SINUS SURGERY     REPLACEMENT TOTAL KNEE Bilateral 2007 2009   TONSILLECTOMY AND ADENOIDECTOMY     WISDOM TOOTH EXTRACTION      MEDICATIONS:  acetaminophen (TYLENOL) 500 MG tablet   aspirin EC 81 MG tablet    cholecalciferol (VITAMIN D3) 25 MCG (1000 UNIT) tablet   fenofibrate 160 MG tablet   folic acid (FOLVITE) 1 MG tablet   ibuprofen (ADVIL) 200 MG tablet   losartan (COZAAR) 25 MG tablet   metFORMIN (GLUCOPHAGE) 500 MG tablet   methotrexate (RHEUMATREX) 2.5 MG tablet   naproxen sodium (ALEVE) 220 MG tablet   Omega-3 Fatty Acids (FISH OIL) 1000 MG CAPS   pravastatin (PRAVACHOL) 20 MG tablet   sitaGLIPtin (JANUVIA) 50 MG tablet   zolpidem (AMBIEN) 5 MG tablet   No current facility-administered medications for this encounter.    Marcille Blanco [facility] Surgical Short Stay/Anesthesiology [facility] Phone [phone] 04/09/2023 10:08 AM

## 2023-04-14 NOTE — Care Plan (Signed)
Ortho Bundle Case Management Note  Patient Details  Name: [name] MRN: [id number] Date of Birth: 1945-10-01  spoke with patient. she will discharge to home with family to assist. has rolling walker at home. OPPT set up with [facility] PT. discharge instructions mailed. questions answered. Patient and MD in agreement with plan. Choice offered.                     DME Arranged:    DME Agency:     HH Arranged:    HH Agency:     Additional Comments: Please contact me with any questions of if this plan should need to change.  Shauna Hugh,  RN,BSN,MHA,CCM  [facility]  [phone] 04/14/2023, 10:03 AM

## 2023-04-15 ENCOUNTER — Other Ambulatory Visit (HOSPITAL_COMMUNITY): Payer: PPO

## 2023-04-19 ENCOUNTER — Ambulatory Visit: Payer: PPO

## 2023-04-19 MED ORDER — SODIUM CHLORIDE 0.9 % IV SOLN
2000.0000 mg | INTRAVENOUS | Status: DC
  Filled 2023-04-19: qty 20

## 2023-04-19 NOTE — H&P (Signed)
TOTAL HIP ADMISSION H&P  Patient is admitted for right total hip arthroplasty.  Subjective:  Chief Complaint: right hip pain  HPI: [name], 77 y.o. female, has a history of pain and functional disability in the right hip(s) due to arthritis and patient has failed non-surgical conservative treatments for greater than 12 weeks to include NSAID's and/or analgesics, corticosteriod injections, flexibility and strengthening excercises, use of assistive devices, weight reduction as appropriate, and activity modification.  Onset of symptoms was gradual starting 5 years ago with gradually worsening course since that time.The patient noted no past surgery on the right hip(s).  Patient currently rates pain in the right hip at 10 out of 10 with activity. Patient has night pain, worsening of pain with activity and weight bearing, trendelenberg gait, pain that interfers with activities of daily living, crepitus, and joint swelling. Patient has evidence of subchondral cysts, subchondral sclerosis, periarticular osteophytes, and joint space narrowing by imaging studies. This condition presents safety issues increasing the risk of falls.  There is no current active infection.  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   Hypermetropia of both eyes 04/22/2018   Hyperopia with presbyopia 04/22/2018   Osteopenia 02/14/2016   Benign hypertension 02/16/2012   Occasional cigarette smoker 04/07/2010   DM2 w/ Hyperlipidemia (HCC) 03/18/2010   Past Medical History:  Diagnosis Date   Allergy    Anxiety    Arthritis     Rheumatoid Arthritis   Bursitis of right hip    Chicken pox    Colon polyp    COPD (chronic obstructive pulmonary disease) (HCC)    Coronary artery disease    CVA (cerebral infarction)    Diabetes mellitus without complication (HCC)    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   Diverticulitis    Family history of colonic polyps 05/16/2020   History of blood transfusion    Hyperlipidemia    Hypertension    Insomnia    Internal hemorrhoids 05/16/2020   Nasal polyps    Obesity    PONV (postoperative nausea and vomiting)    Radial styloid tenosynovitis of left hand 12/21/2019   Rectal bleeding 05/16/2020   Sinusitis, chronic 03/18/2010   Qualifier: Diagnosis of  By: Ninetta Lights MD, Tinnie Gens      Past Surgical History:  Procedure Laterality Date   ABDOMINAL HYSTERECTOMY  2000   APPENDECTOMY     CARDIAC CATHETERIZATION N/A 03/27/2015   Procedure: Left Heart Cath and Coronary Angiography;  Surgeon: Yates Decamp, MD;  Location: [facility] INVASIVE CV LAB;  Service: Cardiovascular;  Laterality: N/A;   CESAREAN SECTION     ELBOW SURGERY Right    FIXATION KYPHOPLASTY LUMBAR SPINE  10/16/2020   L2 and L3 kyphoplasty   HAND TENDON SURGERY Left 02/04/2022   KYPHOPLASTY N/A 10/16/2020   Procedure: LUMBAR 2 AND LUMBAR 3 KYPHOPLASTY;  Surgeon: Estill Bamberg, MD;  Location: [facility] OR;  Service: Orthopedics;  Laterality: N/A;   NASAL SINUS SURGERY     REPLACEMENT TOTAL KNEE Bilateral 2007 2009   TONSILLECTOMY AND ADENOIDECTOMY     WISDOM TOOTH EXTRACTION      Current Facility-Administered Medications  Medication  Dose Route Frequency Provider Last Rate Last Admin   [START ON 04/20/2023] tranexamic acid (CYKLOKAPRON) 2,000 mg in sodium chloride 0.9 % 50 mL Topical Application  2,000 mg Topical On Call to OR Marcene Corning, MD       Current Outpatient Medications  Medication Sig Dispense Refill Last Dose/Taking   acetaminophen (TYLENOL) 500 MG tablet Take 500 mg by mouth daily as needed for moderate pain  (pain score 4-6).   Taking As Needed   aspirin EC 81 MG tablet Take 81 mg by mouth daily. Swallow whole.   Taking   cholecalciferol (VITAMIN D3) 25 MCG (1000 UNIT) tablet Take 1,000 Units by mouth daily.   Taking   fenofibrate 160 MG tablet Take 1 tablet (160 mg total) by mouth daily. 90 tablet 3 Taking   folic acid (FOLVITE) 1 MG tablet TAKE 2 TABLETS BY MOUTH EVERY DAY 180 tablet 3 Taking   ibuprofen (ADVIL) 200 MG tablet Take 200 mg by mouth daily as needed for moderate pain (pain score 4-6).   Taking As Needed   losartan (COZAAR) 25 MG tablet Take 12.5 mg by mouth daily.   Taking   metFORMIN (GLUCOPHAGE) 500 MG tablet Take 1 tablet (500 mg total) by mouth 2 (two) times daily. 180 tablet 1 Taking   methotrexate (RHEUMATREX) 2.5 MG tablet TAKE 7 TABLETS BY MOUTH ONE TIME PER WEEK 84 tablet 0 Taking   naproxen sodium (ALEVE) 220 MG tablet Take 220 mg by mouth daily as needed (pain).   Taking As Needed   Omega-3 Fatty Acids (FISH OIL) 1000 MG CAPS Take 1,000 mg by mouth 2 (two) times daily.   Taking   pravastatin (PRAVACHOL) 20 MG tablet Take 1 tablet (20 mg total) by mouth daily. 90 tablet 1 Taking   sitaGLIPtin (JANUVIA) 50 MG tablet Take 1 tablet (50 mg total) by mouth daily. 90 tablet 1 Taking   zolpidem (AMBIEN) 5 MG tablet Take 1 tablet (5 mg total) by mouth at bedtime as needed for sleep. (Patient taking differently: Take 1.6 mg by mouth at bedtime as needed for sleep.) 90 tablet 1 Taking Differently   Allergies  Allergen Reactions   Lipitor [Atorvastatin Calcium]     Myalgia    Social History   Tobacco Use   Smoking status: Every Day    Current packs/day: 0.50    Average packs/day: 0.5 packs/day for 35.0 years (17.5 ttl pk-yrs)    Types: Cigarettes    Passive exposure: Never   Smokeless tobacco: Never   Tobacco comments:    6 cigarettes per day   Substance Use Topics   Alcohol use: No    Alcohol/week: 0.0 standard drinks of alcohol    Family History  Problem Relation Age  of Onset   Arthritis Mother    Diabetes Mother        Type I   Aneurysm Father    Stroke Father    Arthritis Sister    Arthritis Sister    Down syndrome Son    Diabetes Son        type I     Review of Systems  Musculoskeletal:  Positive for arthralgias.       Right hip  All other systems reviewed and are negative.   Objective:  Physical Exam Constitutional:      Appearance: Normal appearance.  HENT:     Head: Normocephalic and atraumatic.     Nose: Nose normal.     Mouth/Throat:  Pharynx: Oropharynx is clear.  Eyes:     Extraocular Movements: Extraocular movements intact.  Pulmonary:     Effort: Pulmonary effort is normal.  Abdominal:     Palpations: Abdomen is soft.  Musculoskeletal:     Cervical back: Normal range of motion.     Comments: She has a great deal of pain with internal rotation of both hips.  Motion is a bit limited.  Straight leg raise is negative.  She walks with an altered gait.  Sensation and motor function are intact in her feet with palpable pulses on both sides.    Skin:    General: Skin is warm and dry.  Neurological:     General: No focal deficit present.     Mental Status: She is alert and oriented to person, place, and time. Mental status is at baseline.  Psychiatric:        Mood and Affect: Mood normal.        Behavior: Behavior normal.        Thought Content: Thought content normal.        Judgment: Judgment normal.     Vital signs in last 24 hours:    Labs:   Estimated body mass index is 33.3 kg/m as calculated from the following:   Height as of 04/07/23: 5\' 4"  (1.626 m).   Weight as of 04/07/23: 88 kg.   Imaging Review Plain radiographs demonstrate severe degenerative joint disease of the right hip(s). The bone quality appears to be good for age and reported activity level.      Assessment/Plan:  End stage primary arthritis, right hip(s)  The patient history, physical examination, clinical judgement of the  provider and imaging studies are consistent with end stage degenerative joint disease of the right hip(s) and total hip arthroplasty is deemed medically necessary. The treatment options including medical management, injection therapy, arthroscopy and arthroplasty were discussed at length. The risks and benefits of total hip arthroplasty were presented and reviewed. The risks due to aseptic loosening, infection, stiffness, dislocation/subluxation,  thromboembolic complications and other imponderables were discussed.  The patient acknowledged the explanation, agreed to proceed with the plan and consent was signed. Patient is being admitted for inpatient treatment for surgery, pain control, PT, OT, prophylactic antibiotics, VTE prophylaxis, progressive ambulation and ADL's and discharge planning.The patient is planning to be discharged home with home health services

## 2023-04-20 ENCOUNTER — Encounter (HOSPITAL_COMMUNITY)
Admission: RE | Disposition: A | Discharge status: Home / Self Care | Payer: Self-pay | Source: Ambulatory Visit | Attending: Orthopaedic Surgery

## 2023-04-20 ENCOUNTER — Ambulatory Visit (HOSPITAL_COMMUNITY): Payer: PPO | Admitting: Medical

## 2023-04-20 ENCOUNTER — Ambulatory Visit (HOSPITAL_COMMUNITY): Payer: PPO

## 2023-04-20 ENCOUNTER — Encounter (HOSPITAL_COMMUNITY): Payer: Self-pay | Admitting: Orthopaedic Surgery

## 2023-04-20 ENCOUNTER — Ambulatory Visit (HOSPITAL_BASED_OUTPATIENT_CLINIC_OR_DEPARTMENT_OTHER): Payer: PPO | Admitting: Certified Registered Nurse Anesthetist

## 2023-04-20 ENCOUNTER — Ambulatory Visit (HOSPITAL_COMMUNITY)
Admission: RE | Admit: 2023-04-20 | Discharge: 2023-04-20 | Disposition: A | Discharge status: Home / Self Care | Payer: PPO | Source: Ambulatory Visit | Attending: Orthopaedic Surgery | Admitting: Orthopaedic Surgery

## 2023-04-20 DIAGNOSIS — I10 Essential (primary) hypertension: Secondary | ICD-10-CM | POA: Insufficient documentation

## 2023-04-20 DIAGNOSIS — M1611 Unilateral primary osteoarthritis, right hip: Secondary | ICD-10-CM | POA: Diagnosis not present

## 2023-04-20 DIAGNOSIS — E119 Type 2 diabetes mellitus without complications: Secondary | ICD-10-CM | POA: Insufficient documentation

## 2023-04-20 DIAGNOSIS — Z96641 Presence of right artificial hip joint: Secondary | ICD-10-CM | POA: Diagnosis not present

## 2023-04-20 DIAGNOSIS — Z01818 Encounter for other preprocedural examination: Secondary | ICD-10-CM

## 2023-04-20 DIAGNOSIS — F17210 Nicotine dependence, cigarettes, uncomplicated: Secondary | ICD-10-CM | POA: Diagnosis not present

## 2023-04-20 DIAGNOSIS — J449 Chronic obstructive pulmonary disease, unspecified: Secondary | ICD-10-CM | POA: Diagnosis not present

## 2023-04-20 DIAGNOSIS — Z7984 Long term (current) use of oral hypoglycemic drugs: Secondary | ICD-10-CM | POA: Diagnosis not present

## 2023-04-20 DIAGNOSIS — I2510 Atherosclerotic heart disease of native coronary artery without angina pectoris: Secondary | ICD-10-CM | POA: Insufficient documentation

## 2023-04-20 DIAGNOSIS — E785 Hyperlipidemia, unspecified: Secondary | ICD-10-CM

## 2023-04-20 HISTORY — PX: TOTAL HIP ARTHROPLASTY: SHX124

## 2023-04-20 LAB — GLUCOSE, CAPILLARY
Glucose-Capillary: 120 mg/dL — ABNORMAL HIGH (ref 70–99)
Glucose-Capillary: 129 mg/dL — ABNORMAL HIGH (ref 70–99)
Glucose-Capillary: 140 mg/dL — ABNORMAL HIGH (ref 70–99)

## 2023-04-20 SURGERY — ARTHROPLASTY, HIP, TOTAL, ANTERIOR APPROACH
Anesthesia: Spinal | Site: Hip | Laterality: Right

## 2023-04-20 MED ORDER — ONDANSETRON HCL 4 MG/2ML IJ SOLN
INTRAMUSCULAR | Status: AC
  Filled 2023-04-20: qty 2

## 2023-04-20 MED ORDER — MEPERIDINE HCL 50 MG/ML IJ SOLN: 6.2500 mg | INTRAMUSCULAR | Status: DC | PRN

## 2023-04-20 MED ORDER — PROPOFOL 500 MG/50ML IV EMUL
INTRAVENOUS | Status: DC | PRN
  Administered 2023-04-20: 50 ug/kg/min via INTRAVENOUS

## 2023-04-20 MED ORDER — LACTATED RINGERS IV BOLUS
250.0000 mL | Freq: Once | INTRAVENOUS | Status: AC
  Administered 2023-04-20: 250 mL via INTRAVENOUS

## 2023-04-20 MED ORDER — PROPOFOL 1000 MG/100ML IV EMUL
INTRAVENOUS | Status: AC
  Filled 2023-04-20: qty 100

## 2023-04-20 MED ORDER — HYDROCODONE-ACETAMINOPHEN 5-325 MG PO TABS
1.0000 | ORAL_TABLET | ORAL | Status: DC | PRN
  Administered 2023-04-20: 1 via ORAL

## 2023-04-20 MED ORDER — LACTATED RINGERS IV BOLUS
500.0000 mL | Freq: Once | INTRAVENOUS | Status: AC
  Administered 2023-04-20: 500 mL via INTRAVENOUS

## 2023-04-20 MED ORDER — LACTATED RINGERS IV SOLN
INTRAVENOUS | Status: DC
Start: 2023-04-20 — End: 2023-04-20

## 2023-04-20 MED ORDER — ACETAMINOPHEN 160 MG/5ML PO SOLN
325.0000 mg | Freq: Once | ORAL | Status: AC | PRN
Start: 2023-04-20 — End: 2023-04-20

## 2023-04-20 MED ORDER — ACETAMINOPHEN 10 MG/ML IV SOLN: 1000.0000 mg | Freq: Once | INTRAVENOUS | Status: DC | PRN

## 2023-04-20 MED ORDER — BUPIVACAINE HCL (PF) 0.5 % IJ SOLN
INTRAMUSCULAR | Status: AC
  Filled 2023-04-20: qty 30

## 2023-04-20 MED ORDER — CHLORHEXIDINE GLUCONATE 0.12 % MT SOLN
15.0000 mL | Freq: Once | OROMUCOSAL | Status: AC
  Administered 2023-04-20: 15 mL via OROMUCOSAL

## 2023-04-20 MED ORDER — BUPIVACAINE-EPINEPHRINE (PF) 0.25% -1:200000 IJ SOLN
INTRAMUSCULAR | Status: DC | PRN
  Administered 2023-04-20: 30 mL via PERINEURAL

## 2023-04-20 MED ORDER — TRANEXAMIC ACID-NACL 1000-0.7 MG/100ML-% IV SOLN
1000.0000 mg | INTRAVENOUS | Status: AC
  Administered 2023-04-20: 1000 mg via INTRAVENOUS
  Filled 2023-04-20: qty 100

## 2023-04-20 MED ORDER — HYDROCODONE-ACETAMINOPHEN 5-325 MG PO TABS
ORAL_TABLET | ORAL | Status: AC
  Filled 2023-04-20: qty 1

## 2023-04-20 MED ORDER — ACETAMINOPHEN 325 MG PO TABS
325.0000 mg | ORAL_TABLET | Freq: Once | ORAL | Status: AC | PRN
Start: 2023-04-20 — End: 2023-04-20
  Administered 2023-04-20: 650 mg via ORAL

## 2023-04-20 MED ORDER — INSULIN ASPART 100 UNIT/ML IJ SOLN: 0.0000 [IU] | INTRAMUSCULAR | Status: DC | PRN

## 2023-04-20 MED ORDER — PROPOFOL 10 MG/ML IV BOLUS
INTRAVENOUS | Status: DC | PRN
  Administered 2023-04-20: 20 mg via INTRAVENOUS
  Administered 2023-04-20 (×2): 30 mg via INTRAVENOUS
  Administered 2023-04-20: 20 mg via INTRAVENOUS

## 2023-04-20 MED ORDER — BUPIVACAINE LIPOSOME 1.3 % IJ SUSP
INTRAMUSCULAR | Status: DC | PRN
  Administered 2023-04-20: 10 mL

## 2023-04-20 MED ORDER — ONDANSETRON HCL 4 MG/2ML IJ SOLN
INTRAMUSCULAR | Status: DC | PRN
  Administered 2023-04-20: 4 mg via INTRAVENOUS

## 2023-04-20 MED ORDER — TRANEXAMIC ACID-NACL 1000-0.7 MG/100ML-% IV SOLN: 1000.0000 mg | Freq: Once | INTRAVENOUS | Status: AC

## 2023-04-20 MED ORDER — LACTATED RINGERS IV BOLUS: 250.0000 mL | Freq: Once | INTRAVENOUS | Status: DC

## 2023-04-20 MED ORDER — CEFAZOLIN SODIUM-DEXTROSE 2-4 GM/100ML-% IV SOLN
2.0000 g | INTRAVENOUS | Status: AC
  Administered 2023-04-20: 2 g via INTRAVENOUS
  Filled 2023-04-20: qty 100

## 2023-04-20 MED ORDER — CEFAZOLIN SODIUM-DEXTROSE 2-4 GM/100ML-% IV SOLN
INTRAVENOUS | Status: AC
  Administered 2023-04-20: 2 g via INTRAVENOUS
  Filled 2023-04-20: qty 100

## 2023-04-20 MED ORDER — HYDROCODONE-ACETAMINOPHEN 5-325 MG PO TABS: 1.0000 | ORAL_TABLET | Freq: Four times a day (QID) | ORAL | 0 refills | Status: DC | PRN

## 2023-04-20 MED ORDER — TRANEXAMIC ACID-NACL 1000-0.7 MG/100ML-% IV SOLN
INTRAVENOUS | Status: AC
  Administered 2023-04-20: 1000 mg via INTRAVENOUS
  Filled 2023-04-20: qty 100

## 2023-04-20 MED ORDER — DEXAMETHASONE SODIUM PHOSPHATE 10 MG/ML IJ SOLN
INTRAMUSCULAR | Status: DC | PRN
  Administered 2023-04-20: 5 mg via INTRAVENOUS

## 2023-04-20 MED ORDER — BUPIVACAINE IN DEXTROSE 0.75-8.25 % IT SOLN
INTRATHECAL | Status: DC | PRN
  Administered 2023-04-20: 2 mL via INTRATHECAL

## 2023-04-20 MED ORDER — ASPIRIN 81 MG PO TBEC: 81.0000 mg | DELAYED_RELEASE_TABLET | Freq: Two times a day (BID) | ORAL | 0 refills | Status: DC

## 2023-04-20 MED ORDER — EPHEDRINE 5 MG/ML INJ
INTRAVENOUS | Status: AC
  Filled 2023-04-20: qty 5

## 2023-04-20 MED ORDER — POVIDONE-IODINE 10 % EX SWAB: 2.0000 | Freq: Once | CUTANEOUS | Status: DC

## 2023-04-20 MED ORDER — DROPERIDOL 2.5 MG/ML IJ SOLN: 0.6250 mg | Freq: Once | INTRAMUSCULAR | Status: DC | PRN

## 2023-04-20 MED ORDER — BUPIVACAINE LIPOSOME 1.3 % IJ SUSP
INTRAMUSCULAR | Status: AC
  Filled 2023-04-20: qty 10

## 2023-04-20 MED ORDER — DEXAMETHASONE SODIUM PHOSPHATE 10 MG/ML IJ SOLN
INTRAMUSCULAR | Status: AC
  Filled 2023-04-20: qty 1

## 2023-04-20 MED ORDER — ACETAMINOPHEN 325 MG PO TABS
ORAL_TABLET | ORAL | Status: AC
  Filled 2023-04-20: qty 2

## 2023-04-20 MED ORDER — EPHEDRINE SULFATE-NACL 50-0.9 MG/10ML-% IV SOSY
PREFILLED_SYRINGE | INTRAVENOUS | Status: DC | PRN
  Administered 2023-04-20 (×4): 5 mg via INTRAVENOUS

## 2023-04-20 MED ORDER — BUPIVACAINE LIPOSOME 1.3 % IJ SUSP: 10.0000 mL | Freq: Once | INTRAMUSCULAR | Status: DC

## 2023-04-20 MED ORDER — TRANEXAMIC ACID 1000 MG/10ML IV SOLN
INTRAVENOUS | Status: DC | PRN
  Administered 2023-04-20: 2000 mg via TOPICAL

## 2023-04-20 MED ORDER — TIZANIDINE HCL 4 MG PO TABS: 4.0000 mg | ORAL_TABLET | Freq: Four times a day (QID) | ORAL | 1 refills | Status: DC | PRN

## 2023-04-20 MED ORDER — HYDROMORPHONE HCL 1 MG/ML IJ SOLN: 0.2500 mg | INTRAMUSCULAR | Status: DC | PRN

## 2023-04-20 MED ORDER — CEFAZOLIN SODIUM-DEXTROSE 2-4 GM/100ML-% IV SOLN: 2.0000 g | Freq: Four times a day (QID) | INTRAVENOUS | Status: DC

## 2023-04-20 MED ORDER — ORAL CARE MOUTH RINSE: 15.0000 mL | Freq: Once | OROMUCOSAL | Status: AC

## 2023-04-20 MED ORDER — LACTATED RINGERS IV SOLN
INTRAVENOUS | Status: AC
Start: 2023-04-20 — End: 2023-04-20

## 2023-04-20 MED ORDER — 0.9 % SODIUM CHLORIDE (POUR BTL) OPTIME
TOPICAL | Status: DC | PRN
  Administered 2023-04-20: 1000 mL

## 2023-04-20 SURGICAL SUPPLY — 49 items
BAG COUNTER SPONGE SURGICOUNT (BAG) IMPLANT
BAG DECANTER FOR FLEXI CONT (MISCELLANEOUS) ×1 IMPLANT
BLADE SAW SGTL 18X1.27X75 (BLADE) ×1 IMPLANT
BOOTIES KNEE HIGH SLOAN (MISCELLANEOUS) ×1 IMPLANT
CELLS DAT CNTRL 66122 CELL SVR (MISCELLANEOUS) ×1 IMPLANT
COVER PERINEAL POST (MISCELLANEOUS) ×1 IMPLANT
COVER SURGICAL LIGHT HANDLE (MISCELLANEOUS) ×1 IMPLANT
CUP GRIPTON 48MM 100 HIP (Hips) IMPLANT
DRAPE FOOT SWITCH (DRAPES) ×1 IMPLANT
DRAPE IMP U-DRAPE 54X76 (DRAPES) ×1 IMPLANT
DRAPE STERI IOBAN 125X83 (DRAPES) ×1 IMPLANT
DRAPE U-SHAPE 47X51 STRL (DRAPES) ×1 IMPLANT
DRSG AQUACEL AG ADV 3.5X 6 (GAUZE/BANDAGES/DRESSINGS) ×1 IMPLANT
DRSG AQUACEL AG ADV 3.5X10 (GAUZE/BANDAGES/DRESSINGS) IMPLANT
DURAPREP 26ML APPLICATOR (WOUND CARE) ×1 IMPLANT
ELECT BLADE TIP CTD 4 INCH (ELECTRODE) ×1 IMPLANT
ELECT REM PT RETURN 15FT ADLT (MISCELLANEOUS) ×1 IMPLANT
ELIMINATOR HOLE APEX DEPUY (Hips) IMPLANT
GLOVE BIO SURGEON STRL SZ8 (GLOVE) ×2 IMPLANT
GLOVE BIOGEL PI IND STRL 7.0 (GLOVE) ×1 IMPLANT
GLOVE BIOGEL PI IND STRL 8 (GLOVE) ×2 IMPLANT
GLOVE SURG SYN 7.0 (GLOVE) ×1 IMPLANT
GLOVE SURG SYN 7.0 PF PI (GLOVE) ×1 IMPLANT
GOWN SRG XL LVL 4 BRTHBL STRL (GOWNS) ×1 IMPLANT
GOWN STRL REUS W/ TWL XL LVL3 (GOWN DISPOSABLE) ×2 IMPLANT
HEAD FEM STD 32X+1 STRL (Hips) IMPLANT
HEAD FEM STD 32X+5 STRL (Hips) IMPLANT
HOLDER FOLEY CATH W/STRAP (MISCELLANEOUS) ×1 IMPLANT
KIT TURNOVER KIT A (KITS) IMPLANT
LINER ACET 32X48 (Liner) IMPLANT
MANIFOLD NEPTUNE II (INSTRUMENTS) ×1 IMPLANT
NDL HYPO 22X1.5 SAFETY MO (MISCELLANEOUS) ×1 IMPLANT
NEEDLE HYPO 22X1.5 SAFETY MO (MISCELLANEOUS) ×1 IMPLANT
NS IRRIG 1000ML POUR BTL (IV SOLUTION) ×1 IMPLANT
PACK ANTERIOR HIP CUSTOM (KITS) ×1 IMPLANT
PROTECTOR NERVE ULNAR (MISCELLANEOUS) ×1 IMPLANT
RETRACTOR WND ALEXIS 18 MED (MISCELLANEOUS) ×1 IMPLANT
RTRCTR WOUND ALEXIS 18CM MED (MISCELLANEOUS) ×1 IMPLANT
SPIKE FLUID TRANSFER (MISCELLANEOUS) ×1 IMPLANT
STEM FEMORAL SZ5 HIGH ACTIS (Stem) IMPLANT
SUT ETHIBOND NAB CT1 #1 30IN (SUTURE) ×2 IMPLANT
SUT STRATAFIX 0 PDS 27 VIOLET (SUTURE) ×1 IMPLANT
SUT VIC AB 1 CT1 36 (SUTURE) ×1 IMPLANT
SUT VIC AB 2-0 CT1 TAPERPNT 27 (SUTURE) ×1 IMPLANT
SUT VICRYL AB 3-0 FS1 BRD 27IN (SUTURE) ×1 IMPLANT
SUTURE STRATFX 0 PDS 27 VIOLET (SUTURE) ×1 IMPLANT
SYR 50ML LL SCALE MARK (SYRINGE) ×1 IMPLANT
TRAY FOLEY MTR SLVR 16FR STAT (SET/KITS/TRAYS/PACK) ×1 IMPLANT
YANKAUER SUCT BULB TIP NO VENT (SUCTIONS) ×1 IMPLANT

## 2023-04-20 NOTE — Op Note (Signed)
PRE-OP DIAGNOSIS:  RIGHT HIP DEGENERATIVE JOINT DISEASE POST-OP DIAGNOSIS:  same PROCEDURE: RIGHT TOTAL HIP ARTHROPLASTY ANTERIOR APPROACH ANESTHESIA:  Spinal and MAC SURGEON:  Marcene Corning MD ASSISTANT:  Elodia Florence PA-C   INDICATIONS FOR PROCEDURE:  The patient is a 77 y.o. female with a long history of a painful hip.  This has persisted despite multiple conservative measures.  The patient has persisted with pain and dysfunction making rest and activity difficult.  A total hip replacement is offered as surgical treatment.  Informed operative consent was obtained after discussion of possible complications including reaction to anesthesia, infection, neurovascular injury, dislocation, DVT, PE, and death.  The importance of the postoperative rehab program to optimize result was stressed with the patient.  SUMMARY OF FINDINGS AND PROCEDURE:  Under the above anesthesia through a anterior approach an the Hana table a right THR was performed.  The patient had severe degenerative change and good bone quality.  We used DePuy components to replace the hip and these were size 5 high offset Actis femur capped with a +5 32mm metal hip ball.  On the acetabular side we used a size 48 Gription shell with a  plus 0 neutral polyethylene liner.  We did use a hole eliminator.  Elodia Florence PA-C assisted throughout and was invaluable to the completion of the case in that he helped position and retract while I performed the procedure.  He also closed simultaneously to help minimize OR time.  I used fluoroscopy throughout the case to check position of implants and leg lengths and read all of these views myself.  DESCRIPTION OF PROCEDURE:  The patient was taken to the OR suite where the above anesthetic was applied.  The patient was then positioned on the Hana table supine.  All bony prominences were appropriately padded.  Prep and drape was then performed in normal sterile fashion.  The patient was given kefzol preoperative  antibiotic and an appropriate time out was performed.  We then took an anterior approach to the right hip.  Dissection was taken through adipose to the tensor fascia lata fascia.  This structure was incised longitudinally and we dissected in the intermuscular interval just medial to this muscle.  Cobra retractors were placed superior and inferior to the femoral neck superficial to the capsule.  A capsular incision was then made and the retractors were placed along the femoral neck.  Xray was brought in to get a good level for the femoral neck cut which was made with an oscillating saw and osteotome.  The femoral head was removed with a corkscrew.  The acetabulum was exposed and some labral tissues were excised. Reaming was taken to the inside wall of the pelvis and sequentially up to 1 mm smaller than the actual component.  A trial of components was done and then the aforementioned acetabular shell was placed in appropriate tilt and anteversion confirmed by fluoroscopy. The liner was placed along with the hole eliminator and attention was turned to the femur.  The leg was brought down and over into adduction and the elevator bar was used to raise the femur up gently in the wound.  The piriformis was released with care taken to preserve the obturator internus attachment and all of the posterior capsule. The femur was reamed and then broached to the appropriate size.  A trial reduction was done and the aforementioned head and neck assembly gave [place] the best stability in extension with external rotation.  Leg lengths were felt to be  about equal by fluoroscopic exam.  The trial components were removed and the wound irrigated.  We then placed the femoral component in appropriate anteversion.  The head was applied to a dry stem neck and the hip again reduced.  It was again stable in the aforementioned position.  The would was irrigated again followed by re-approximation of anterior capsule with ethibond suture. Tensor  fascia was repaired with V-loc suture  followed by deep closure with #O and #2 undyed vicryl.  Skin was closed with subQ stitch and steristrips followed by a sterile dressing.  EBL and IOF can be obtained from anesthesia records.  DISPOSITION:  The patient was taken to PACU in stable condition to potentially go home same day depending on ability to walk and tolerate liquids.

## 2023-04-20 NOTE — Evaluation (Signed)
Physical Therapy Evaluation Patient Details Name: [name] MRN: [id number] DOB: 1945/06/11 Today's Date: 04/20/2023  History of Present Illness  77 yo female presents to therapy s/p R THA, anterior approach on 04/20/2023 due to failure of conservative measures. Pt PMH includes but is not limited to: schamberg's purpura, RA, HTN, DM II, HLD, COPD, diverticulitis, tobacco abuse,  lumbar spine surgery, and B TKA.  Clinical Impression    GEANA Hoffman is a 77 y.o. female POD 0 s/p R THA. Patient reports IND with mobility at baseline. Patient is now limited by functional impairments (see PT problem list below) and requires CGA and min cues for transfers and gait with RW. Patient was able to ambulate 50 feet with RW and CGA and cues for safe walker management. Patient educated on safe sequencing for stair mobility emulating home environment without handrails with use of objects for support and SPC, fall risk prevention, use of RW vs rollator, slowly increasing activity, pain goal and management, use of CP/ice and car transfers pt and family members  verbalized understanding of safe guarding position for people assisting with mobility. Patient instructed in exercises to facilitate ROM and circulation reviewed and HO provided. Patient will benefit from continued skilled PT interventions to address impairments and progress towards PLOF. Patient has met mobility goals at adequate level for discharge home with family support and OPPT services; will continue to follow if pt continues acute stay to progress towards Mod I goals.     If plan is discharge home, recommend the following: A little help with walking and/or transfers;A little help with bathing/dressing/bathroom;Assistance with cooking/housework;Assist for transportation;Help with stairs or ramp for entrance   Can travel by private vehicle        Equipment Recommendations None recommended by PT  Recommendations for Other Services        Functional Status Assessment Patient has had a recent decline in their functional status and demonstrates the ability to make significant improvements in function in a reasonable and predictable amount of time.     Precautions / Restrictions Precautions Precautions: Fall Restrictions Weight Bearing Restrictions Per Provider Order: No      Mobility  Bed Mobility Overal bed mobility: Needs Assistance Bed Mobility: Supine to Sit     Supine to sit: Supervision, HOB elevated     General bed mobility comments: min cues    Transfers Overall transfer level: Needs assistance Equipment used: Rolling walker (2 wheels) Transfers: Sit to/from Stand Sit to Stand: Contact guard assist           General transfer comment: min cues for bed, recliner and commode    Ambulation/Gait Ambulation/Gait assistance: Contact guard assist Gait Distance (Feet): 50 Feet Assistive device: Rolling walker (2 wheels) Gait Pattern/deviations: Step-to pattern, Antalgic, Trunk flexed     Pre-gait activities: \ General Gait Details: B UE support at RW to offload R LE in stance phase with slight trunk flexion min cues for safety and sequencing  Stairs Stairs: Yes Stairs assistance: Contact guard assist Stair Management: Two rails Number of Stairs: 2 General stair comments: step navigation assessed with B handrails with CGA and cues  and progressing to emulating home setting with pt having a cabinet on L for support and use of door frame on R with CGA and cues to decend steps pt and sister instructed on use of SPC and cabeniet for support with pt performing return demonstration with CGA and cues  Wheelchair Mobility     Tilt Bed  Modified Rankin (Stroke Patients Only)       Balance Overall balance assessment: Needs assistance Sitting-balance support: Feet supported Sitting balance-Leahy Scale: Good     Standing balance support: Bilateral upper extremity supported, During functional  activity, Reliant on assistive device for balance Standing balance-Leahy Scale: Fair Standing balance comment: static standing no UE support                             Pertinent Vitals/Pain Pain Assessment Pain Assessment: 0-10 Pain Score: 6  Pain Location: R hip and LE Pain Descriptors / Indicators: Aching, Discomfort, Constant, Operative site guarding, Burning Pain Intervention(s): Limited activity within patient's tolerance, Monitored during session, Premedicated before session, Repositioned, Ice applied    Home Living Family/patient expects to be discharged to:: Private residence Living Arrangements: Children (sister flew in from [place] to be with pt s/p surgery) Available Help at Discharge: Family;Available 24 hours/day Type of Home: House Home Access: Stairs to enter Entrance Stairs-Rails: None Entrance Stairs-Number of Steps: 3   Home Layout: Two level;Able to live on main level with bedroom/bathroom Home Equipment: Rolling Walker (2 wheels);Rollator (4 wheels);Cane - single point      Prior Function Prior Level of Function : Independent/Modified Independent;Driving             Mobility Comments: IND with all ADLs self care tasks and IADLs       Extremity/Trunk Assessment        Lower Extremity Assessment Lower Extremity Assessment: RLE deficits/detail RLE Deficits / Details: ankle DF/PF 5/5 RLE Sensation: WNL    Cervical / Trunk Assessment Cervical / Trunk Assessment: Back Surgery  Communication   Communication Communication: No apparent difficulties  Cognition Arousal: Alert Behavior During Therapy: WFL for tasks assessed/performed Overall Cognitive Status: Within Functional Limits for tasks assessed                                          General Comments      Exercises Total Joint Exercises Ankle Circles/Pumps: AROM, Both, 15 reps Quad Sets: AROM, Right, 5 reps Heel Slides: AROM, Right, 5 reps Hip  ABduction/ADduction: AROM, Right, 5 reps, Standing Long Arc Quad: AROM, Right, 5 reps, Seated Knee Flexion: AROM, Right, 5 reps, Standing Standing Hip Extension: AROM, Right, 5 reps   Assessment/Plan    PT Assessment Patient needs continued PT services  PT Problem List Decreased strength;Decreased range of motion;Decreased balance;Decreased activity tolerance;Decreased mobility;Decreased coordination;Pain       PT Treatment Interventions DME instruction;Gait training;Stair training;Functional mobility training;Therapeutic activities;Therapeutic exercise;Balance training;Neuromuscular re-education;Patient/family education;Modalities    PT Goals (Current goals can be found in the Care Plan section)  Acute Rehab PT Goals Patient Stated Goal: to be able to walk without pain PT Goal Formulation: With patient Time For Goal Achievement: 05/04/23 Potential to Achieve Goals: Good    Frequency 7X/week     Co-evaluation               AM-PAC PT "6 Clicks" Mobility  Outcome Measure Help needed turning from your back to your side while in a flat bed without using bedrails?: A Little Help needed moving from lying on your back to sitting on the side of a flat bed without using bedrails?: A Little Help needed moving to and from a bed to a chair (including a wheelchair)?: A Little Help needed  standing up from a chair using your arms (e.g., wheelchair or bedside chair)?: A Little Help needed to walk in hospital room?: A Little Help needed climbing 3-5 steps with a railing? : A Little 6 Click Score: 18    End of Session Equipment Utilized During Treatment: Gait belt Activity Tolerance: No increased pain;Patient tolerated treatment well Patient left: in chair;with call bell/phone within reach;with family/visitor present Nurse Communication: Mobility status;Other (comment) (pt readiness for d/c from PT standpoint) PT Visit Diagnosis: Unsteadiness on feet (R26.81);Other abnormalities of gait  and mobility (R26.89);Difficulty in walking, not elsewhere classified (R26.2);Pain Pain - Right/Left: Right Pain - part of body: Hip;Leg    Time: 2025-4270 PT Time Calculation (min) (ACUTE ONLY): 42 min   Charges:   PT Evaluation $PT Eval Low Complexity: 1 Low PT Treatments $Gait Training: 8-22 mins PT General Charges $$ ACUTE PT VISIT: 1 Visit         Johnny Bridge, PT Acute Rehab   Jacqualyn Posey 04/20/2023, 5:36 PM

## 2023-04-20 NOTE — Anesthesia Procedure Notes (Signed)
Procedure Name: MAC Date/Time: 04/20/2023 10:09 AM  Performed by: Ludwig Lean, CRNAPre-anesthesia Checklist: Patient identified, Emergency Drugs available, Suction available and Patient being monitored Patient Re-evaluated:Patient Re-evaluated prior to induction Oxygen Delivery Method: Simple face mask Preoxygenation: Pre-oxygenation with 100% oxygen Placement Confirmation: positive ETCO2 and breath sounds checked- equal and bilateral Dental Injury: Teeth and Oropharynx as per pre-operative assessment

## 2023-04-20 NOTE — Transfer of Care (Signed)
Immediate Anesthesia Transfer of Care Note  Patient: [name]  Procedure(s) Performed: Procedure(s): TOTAL HIP ARTHROPLASTY ANTERIOR APPROACH (Right)  Patient Location: PACU  Anesthesia Type:MAC and Spinal  Level of Consciousness: Patient easily awoken, comfortable, cooperative, following commands, responds to stimulation.   Airway & Oxygen Therapy: Patient spontaneously breathing, ventilating well, oxygen via simple oxygen mask.  Post-op Assessment: Report given to PACU RN, vital signs reviewed and stable.   Post vital signs: Reviewed and stable.  Complications: No apparent anesthesia complications Last Vitals:  Vitals Value Taken Time  BP 88/53 04/20/23 1145  Temp 36.6 C 04/20/23 1145  Pulse 92 04/20/23 1149  Resp 19 04/20/23 1149  SpO2 100 % 04/20/23 1149  Vitals shown include unfiled device data.  Last Pain:  Vitals:   04/20/23 0757  TempSrc:   PainSc: 6       Patients Stated Pain Goal: 5 (04/20/23 0757)  Complications: No notable events documented.

## 2023-04-20 NOTE — Interval H&P Note (Signed)
History and Physical Interval Note:  04/20/2023 9:12 AM  [name]  has presented today for surgery, with the diagnosis of RIGHT HIP DEGENERATIVE JOINT DISEASE.  The various methods of treatment have been discussed with the patient and family. After consideration of risks, benefits and other options for treatment, the patient has consented to  Procedure(s): TOTAL HIP ARTHROPLASTY ANTERIOR APPROACH (Right) as a surgical intervention.  The patient's history has been reviewed, patient examined, no change in status, stable for surgery.  I have reviewed the patient's chart and labs.  Questions were answered to the patient's satisfaction.     Velna Ochs

## 2023-04-20 NOTE — Anesthesia Procedure Notes (Signed)
Spinal  Start time: 04/20/2023 10:11 AM End time: 04/20/2023 10:13 AM Reason for block: surgical anesthesia Staffing Performed: anesthesiologist  Anesthesiologist: Shelton Silvas, MD Performed by: Shelton Silvas, MD Authorized by: Shelton Silvas, MD   Preanesthetic Checklist Completed: patient identified, IV checked, site marked, risks and benefits discussed, surgical consent, monitors and equipment checked, pre-op evaluation and timeout performed Spinal Block Patient position: sitting Prep: DuraPrep and site prepped and draped Location: L3-4 Injection technique: single-shot Needle Needle type: Pencan  Needle gauge: 24 G Needle length: 10 cm Needle insertion depth: 10 cm Additional Notes Patient tolerated well. No immediate complications.  Functioning IV was confirmed and monitors were applied. Sterile prep and drape, including hand hygiene and sterile gloves were used. The patient was positioned and the back was prepped. The skin was anesthetized with lidocaine. Free flow of clear CSF was obtained prior to injecting local anesthetic into the CSF. The spinal needle aspirated freely following injection. The needle was carefully withdrawn. The patient tolerated the procedure well.

## 2023-04-21 ENCOUNTER — Encounter (HOSPITAL_COMMUNITY): Payer: Self-pay | Admitting: Orthopaedic Surgery

## 2023-04-21 NOTE — Anesthesia Postprocedure Evaluation (Signed)
Anesthesia Post Note  Patient: [name]  Procedure(s) Performed: TOTAL HIP ARTHROPLASTY ANTERIOR APPROACH (Right: Hip)     Patient location during evaluation: PACU Anesthesia Type: Spinal Level of consciousness: oriented and awake and alert Pain management: pain level controlled Vital Signs Assessment: post-procedure vital signs reviewed and stable Respiratory status: spontaneous breathing, respiratory function stable and patient connected to nasal cannula oxygen Cardiovascular status: blood pressure returned to baseline and stable Postop Assessment: no headache, no backache and no apparent nausea or vomiting Anesthetic complications: no   No notable events documented.  Last Vitals:  Vitals:   04/20/23 1530 04/20/23 1723  BP: 126/74 115/70  Pulse: 97   Resp:  15  Temp:  37.1 C  SpO2: 94% 96%    Last Pain:  Vitals:   04/20/23 1723  TempSrc:   PainSc: 3                  Shelton Silvas

## 2023-04-23 DIAGNOSIS — Z4789 Encounter for other orthopedic aftercare: Secondary | ICD-10-CM | POA: Diagnosis not present

## 2023-04-23 DIAGNOSIS — M25551 Pain in right hip: Secondary | ICD-10-CM | POA: Diagnosis not present

## 2023-04-30 DIAGNOSIS — M25551 Pain in right hip: Secondary | ICD-10-CM | POA: Diagnosis not present

## 2023-04-30 DIAGNOSIS — Z4789 Encounter for other orthopedic aftercare: Secondary | ICD-10-CM | POA: Diagnosis not present

## 2023-05-03 DIAGNOSIS — M1611 Unilateral primary osteoarthritis, right hip: Secondary | ICD-10-CM | POA: Diagnosis not present

## 2023-05-03 DIAGNOSIS — M25551 Pain in right hip: Secondary | ICD-10-CM | POA: Diagnosis not present

## 2023-05-03 DIAGNOSIS — Z4789 Encounter for other orthopedic aftercare: Secondary | ICD-10-CM | POA: Diagnosis not present

## 2023-05-10 DIAGNOSIS — M25551 Pain in right hip: Secondary | ICD-10-CM | POA: Diagnosis not present

## 2023-05-10 DIAGNOSIS — Z4789 Encounter for other orthopedic aftercare: Secondary | ICD-10-CM | POA: Diagnosis not present

## 2023-05-11 NOTE — Progress Notes (Signed)
Office Visit Note  Patient: [name]             Date of Birth: 01/05/1946           MRN: [id number]             PCP: Natalia Leatherwood, DO Referring: Natalia Leatherwood, DO Visit Date: 05/25/2023 Occupation: @GUAROCC @  Subjective:  Medication monitoring  History of Present Illness: [name] is a 78 y.o. female with history of seronegative rheumatoid arthritis and osteoarthritis.  Patient remains on  Methotrexate 7 tablets once weekly and folic acid 2 mg by mouth daily. Patient underwent a right hip replacement on 04/20/23 performed by Dr. Jerl Santos.  She is currently going to physical therapy once a week and her mobility continues to improve.  She is no longer using a cane or walker to assist with ambulation.  Patient denies any signs or symptoms of a rheumatoid arthritis flare.  She has not had any joint swelling. No recent or recurrent infections.  Activities of Daily Living:  Patient reports morning stiffness for 1 hour.   Patient Reports nocturnal pain.  Difficulty dressing/grooming: Denies Difficulty climbing stairs: Reports Difficulty getting out of chair: Denies Difficulty using hands for taps, buttons, cutlery, and/or writing: Denies  Review of Systems  Constitutional:  Positive for fatigue.  HENT:  Negative for mouth sores, mouth dryness and nose dryness.   Eyes:  Negative for pain and dryness.  Respiratory:  Negative for cough, shortness of breath and difficulty breathing.   Cardiovascular:  Negative for chest pain and palpitations.  Gastrointestinal:  Negative for blood in stool, constipation and diarrhea.  Endocrine: Negative for increased urination.  Genitourinary:  Negative for painful urination and involuntary urination.  Musculoskeletal:  Positive for joint pain, joint pain and morning stiffness. Negative for joint swelling, myalgias, muscle weakness, muscle tenderness and myalgias.  Skin:  Positive for rash and hair loss. Negative for color change and  sensitivity to sunlight.  Allergic/Immunologic: Negative for susceptible to infections.  Neurological:  Positive for dizziness. Negative for numbness and headaches.  Hematological:  Negative for swollen glands.  Psychiatric/Behavioral:  Positive for sleep disturbance. Negative for depressed mood. The patient is not nervous/anxious.     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   Hypermetropia of both eyes 04/22/2018   Hyperopia with presbyopia 04/22/2018   Osteopenia 02/14/2016   Benign hypertension 02/16/2012   Occasional cigarette smoker 04/07/2010   DM2 w/ Hyperlipidemia (HCC) 03/18/2010    Past Medical History:  Diagnosis Date   Allergy    Anxiety    Arthritis    Rheumatoid Arthritis   Bursitis of right hip    Chicken pox    Colon polyp    COPD (chronic obstructive pulmonary disease) (HCC)    Coronary artery disease    CVA (cerebral infarction)    Diabetes mellitus without complication (HCC)    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   Diverticulitis    Family history of colonic polyps 05/16/2020   History of blood transfusion  Hyperlipidemia    Hypertension    Insomnia    Internal hemorrhoids 05/16/2020   Nasal polyps    Obesity    PONV (postoperative nausea and vomiting)    Radial styloid tenosynovitis of left hand 12/21/2019   Rectal bleeding 05/16/2020   Sinusitis, chronic 03/18/2010   Qualifier: Diagnosis of  By: Ninetta Lights MD, Jewel Baize History  Problem Relation Age of Onset    Arthritis Mother    Diabetes Mother        Type I   Aneurysm Father    Stroke Father    Arthritis Sister    Arthritis Sister    Down syndrome Son    Diabetes Son        type I   Past Surgical History:  Procedure Laterality Date   ABDOMINAL HYSTERECTOMY  2000   APPENDECTOMY     CARDIAC CATHETERIZATION N/A 03/27/2015   Procedure: Left Heart Cath and Coronary Angiography;  Surgeon: Yates Decamp, MD;  Location: [facility] INVASIVE CV LAB;  Service: Cardiovascular;  Laterality: N/A;   CESAREAN SECTION     ELBOW SURGERY Right    FIXATION KYPHOPLASTY LUMBAR SPINE  10/16/2020   L2 and L3 kyphoplasty   HAND TENDON SURGERY Left 02/04/2022   KYPHOPLASTY N/A 10/16/2020   Procedure: LUMBAR 2 AND LUMBAR 3 KYPHOPLASTY;  Surgeon: Estill Bamberg, MD;  Location: [facility] OR;  Service: Orthopedics;  Laterality: N/A;   NASAL SINUS SURGERY     REPLACEMENT TOTAL KNEE Bilateral 2007 2009   TONSILLECTOMY AND ADENOIDECTOMY     TOTAL HIP ARTHROPLASTY Right 04/20/2023   Procedure: TOTAL HIP ARTHROPLASTY ANTERIOR APPROACH;  Surgeon: Marcene Corning, MD;  Location: [facility] ORS;  Service: Orthopedics;  Laterality: Right;   WISDOM TOOTH EXTRACTION     Social History   Social History Narrative   Marital status/children/pets: Married, 1 child.    Education/employment: Automotive engineer educated, retired.   Safety:      -smoke alarm in the home:Yes     - wears seatbelt: Yes     - Feels safe in their relationships: Yes   Immunization History  Administered Date(s) Administered   Fluad Quad(high Dose 65+) 01/11/2019, 02/15/2020, 03/06/2022   Fluad Trivalent(High Dose 65+) 03/17/2023   Influenza Whole 04/21/2010   Influenza, High Dose Seasonal PF 02/20/2013, 02/07/2018, 01/30/2021   Influenza,inj,Quad PF,6+ Mos 02/23/2017   Influenza-Unspecified 02/15/2014, 01/30/2016, 02/07/2018, 01/11/2019   PFIZER Comirnaty(Gray Top)Covid-19 Tri-Sucrose Vaccine 10/09/2020   PFIZER(Purple Top)SARS-COV-2 Vaccination 06/08/2019, 07/03/2019, 01/04/2020    PNEUMOCOCCAL CONJUGATE-20 05/13/2021   Pfizer Covid-19 Vaccine Bivalent Booster 66yrs & up 03/24/2021   Pfizer(Comirnaty)Fall Seasonal Vaccine 12 years and older 03/27/2022, 01/06/2023   Pneumococcal Conjugate-13 02/20/2013   Pneumococcal Polysaccharide-23 04/05/2006   Pneumococcal-Unspecified 03/05/2015   Tdap 09/14/2001, 03/05/2015   Tetanus 03/27/2014   Zoster Recombinant(Shingrix) 10/12/2017, 02/07/2018   Zoster, Live 12/07/2013     Objective: Vital Signs: BP 122/83 (BP Location: Left Arm, Patient Position: Sitting, Cuff Size: Large)   Pulse 87   Resp 13   Ht 5\' 4"  (1.626 m)   Wt 198 lb (89.8 kg)   BMI 33.99 kg/m    Physical Exam Vitals and nursing note reviewed.  Constitutional:      Appearance: She is well-developed.  HENT:     Head: Normocephalic and atraumatic.  Eyes:     Conjunctiva/sclera: Conjunctivae normal.  Cardiovascular:     Rate and Rhythm: Normal rate and regular rhythm.     Heart sounds: Normal heart sounds.  Pulmonary:     Effort: Pulmonary effort is normal.     Breath sounds: Normal breath sounds.  Abdominal:     General: Bowel sounds are normal.     Palpations: Abdomen is soft.  Musculoskeletal:     Cervical back: Normal range of motion.  Lymphadenopathy:     Cervical: No cervical adenopathy.  Skin:    General: Skin is warm and dry.     Capillary Refill: Capillary refill takes less than 2 seconds.  Neurological:     Mental Status: She is alert and oriented to person, place, and time.  Psychiatric:        Behavior: Behavior normal.      Musculoskeletal Exam: C-spine good ROM.  Shoulder joints, elbow joints, wrist joints, Mcps, PIPs, and DIPs good ROM with no synovitis.  Complete fist formation bilaterally. Right hip replacement has good range of motion.  Bilateral knee replacements have good range of motion with no effusion.  Ankle joints have good range of motion with no tenderness or joint swelling.  CDAI Exam: CDAI Score: -- Patient  Global: 20 / 100; Provider Global: 20 / 100 Swollen: --; Tender: -- Joint Exam 05/25/2023   No joint exam has been documented for this visit   There is currently no information documented on the homunculus. Go to the Rheumatology activity and complete the homunculus joint exam.  Investigation: No additional findings.  Imaging: MM 3D SCREENING MAMMOGRAM BILATERAL BREAST Result Date: 05/13/2023 CLINICAL DATA:  Screening. EXAM: DIGITAL SCREENING BILATERAL MAMMOGRAM WITH TOMOSYNTHESIS AND CAD TECHNIQUE: Bilateral screening digital craniocaudal and mediolateral oblique mammograms were obtained. Bilateral screening digital breast tomosynthesis was performed. The images were evaluated with computer-aided detection. COMPARISON:  Previous exam(s). ACR Breast Density Category b: There are scattered areas of fibroglandular density. FINDINGS: There are no findings suspicious for malignancy. IMPRESSION: No mammographic evidence of malignancy. A result letter of this screening mammogram will be mailed directly to the patient. RECOMMENDATION: Screening mammogram in one year. (Code:SM-B-01Y) BI-RADS CATEGORY  1: Negative. Electronically Signed   By: Annia Belt M.D.   On: 05/13/2023 10:35    Recent Labs: Lab Results  Component Value Date   WBC 5.3 05/18/2023   HGB 12.7 05/18/2023   PLT 294 05/18/2023   NA 137 05/18/2023   K 4.2 05/18/2023   CL 105 05/18/2023   CO2 26 05/18/2023   GLUCOSE 118 (H) 05/18/2023   BUN 21 05/18/2023   CREATININE 0.88 05/18/2023   BILITOT 0.3 05/18/2023   ALKPHOS 36 (L) 04/07/2023   AST 24 05/18/2023   ALT 15 05/18/2023   PROT 7.4 05/18/2023   ALBUMIN 4.0 04/07/2023   CALCIUM 9.7 05/18/2023   GFRAA 73 09/05/2020   QFTBGOLDPLUS NEGATIVE 06/28/2019    Speciality Comments: No specialty comments available.  Procedures:  No procedures performed Allergies: Lipitor [atorvastatin calcium]   Assessment / Plan:     Visit Diagnoses: Rheumatoid arthritis of multiple sites  with negative rheumatoid factor (HCC): She has no synovitis on examination today.  She has not had any signs or symptoms of a rheumatoid arthritis flare.  She has clinically been doing well taking methotrexate 7 tablets by mouth once weekly and folic acid 2 mg daily.  She continues to tolerate methotrexate without any side effects.  No recent or recurrent infections.  Patient recently underwent a right total hip arthroplasty on 04/20/2023 performed by Dr. Jerl Santos.  She held methotrexate around the time of her surgery but has resumed therapy without a flare.  She was advised to notify us if she develops signs or symptoms of a flare.  She will follow-up in the office in 5 months or sooner if needed.  High risk medication use - Methotrexate 7 tablets once weekly and folic acid 2 mg by mouth daily. CBC and CMP updated on 05/18/23.  Her next lab work will be due in April and every 3 months. Standing order for CBC and CMP remain in place.  No recent or recurrent infections.  Discussed the importance of holding methotrexate if she develops signs or symptoms of an infection and to resume once the infection has completely cleared.   Primary osteoarthritis of both hands: She has PIP and DIP thickening consistent with osteoarthritis of both hands.  No tenderness or synovitis noted.  Complete fist formation bilaterally.  S/P hip replacement, right: Performed by Dr. Jerl Santos on 04/20/23.  Doing well.  Followed up with Dr. Jerl Santos yesterday.  Currently going to PT. Mobility continues to improve.    Status post total bilateral knee replacement - She is followed by Dr. Jerl Santos.  Doing well.  Her knee pain has improved since having her right hip replaced on 04/20/2023.  Overall her mobility has been improving.  She has been going to physical therapy as ordered by Dr. Jerl Santos.  Primary osteoarthritis of both feet: She is not experiencing any increased discomfort in her feet at this time.  She is good range of motion of  both ankle joints with no tenderness or joint swelling.  Osteopenia of multiple sites - Kyphoplasty for L2 and L3 on 10/16/2020 performed by Dr. Yevette Edwards. DEXA 04/15/22 RFN BMD 0.883 g/cm2 with a T-scoreof -1.1.  Use of calcium and vitamin D was advised. Due to update DEXA in December 2025.   Vitamin D deficiency: She is taking vitamin D 1000 units daily.   Trochanteric bursitis, right hip: Not currently symptomatic.   Chronic right shoulder pain: Good ROM with no discomfort.   Other medical conditions are listed as follows:   Other fatigue  Diabetes mellitus type 2 with complications (HCC)  Hypertension associated with diabetes (HCC): Blood pressure was 122/83 today in the office.  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  Meds ordered this encounter  Medications   methotrexate (RHEUMATREX) 2.5 MG tablet    Sig: Take 7 tablets by mouth once weekly. Caution:Chemotherapy. Protect from light.    Dispense:  84 tablet    Refill:  0    Follow-Up Instructions: Return in about 5 months (around 10/23/2023) for Rheumatoid arthritis.   Gearldine Bienenstock, PA-C  Note - This record has been created using Dragon software.  Chart creation errors have been sought, but may not always  have been located. Such creation errors do not reflect on  the standard of medical care.

## 2023-05-12 ENCOUNTER — Ambulatory Visit
Admission: RE | Admit: 2023-05-12 | Discharge: 2023-05-12 | Disposition: A | Discharge status: Home / Self Care | Payer: PPO | Source: Ambulatory Visit | Attending: Family Medicine | Admitting: Family Medicine

## 2023-05-12 DIAGNOSIS — Z1231 Encounter for screening mammogram for malignant neoplasm of breast: Secondary | ICD-10-CM

## 2023-05-14 DIAGNOSIS — M25551 Pain in right hip: Secondary | ICD-10-CM | POA: Diagnosis not present

## 2023-05-14 DIAGNOSIS — Z4789 Encounter for other orthopedic aftercare: Secondary | ICD-10-CM | POA: Diagnosis not present

## 2023-05-17 DIAGNOSIS — Z4789 Encounter for other orthopedic aftercare: Secondary | ICD-10-CM | POA: Diagnosis not present

## 2023-05-17 DIAGNOSIS — M25551 Pain in right hip: Secondary | ICD-10-CM | POA: Diagnosis not present

## 2023-05-18 ENCOUNTER — Other Ambulatory Visit: Payer: Self-pay | Admitting: *Deleted

## 2023-05-18 DIAGNOSIS — Z79899 Other long term (current) drug therapy: Secondary | ICD-10-CM | POA: Diagnosis not present

## 2023-05-19 LAB — CBC WITH DIFFERENTIAL/PLATELET
Absolute Lymphocytes: 1940 {cells}/uL (ref 850–3900)
Absolute Monocytes: 323 {cells}/uL (ref 200–950)
Basophils Absolute: 42 {cells}/uL (ref 0–200)
Basophils Relative: 0.8 %
Eosinophils Absolute: 281 {cells}/uL (ref 15–500)
Eosinophils Relative: 5.3 %
HCT: 38.1 % (ref 35.0–45.0)
Hemoglobin: 12.7 g/dL (ref 11.7–15.5)
MCH: 29.8 pg (ref 27.0–33.0)
MCHC: 33.3 g/dL (ref 32.0–36.0)
MCV: 89.4 fL (ref 80.0–100.0)
MPV: 9.9 fL (ref 7.5–12.5)
Monocytes Relative: 6.1 %
Neutro Abs: 2714 {cells}/uL (ref 1500–7800)
Neutrophils Relative %: 51.2 %
Platelets: 294 10*3/uL (ref 140–400)
RBC: 4.26 10*6/uL (ref 3.80–5.10)
RDW: 13.1 % (ref 11.0–15.0)
Total Lymphocyte: 36.6 %
WBC: 5.3 10*3/uL (ref 3.8–10.8)

## 2023-05-19 LAB — COMPLETE METABOLIC PANEL WITH GFR
AG Ratio: 1.3 (calc) (ref 1.0–2.5)
ALT: 15 U/L (ref 6–29)
AST: 24 U/L (ref 10–35)
Albumin: 4.2 g/dL (ref 3.6–5.1)
Alkaline phosphatase (APISO): 60 U/L (ref 37–153)
BUN: 21 mg/dL (ref 7–25)
CO2: 26 mmol/L (ref 20–32)
Calcium: 9.7 mg/dL (ref 8.6–10.4)
Chloride: 105 mmol/L (ref 98–110)
Creat: 0.88 mg/dL (ref 0.60–1.00)
Globulin: 3.2 g/dL (ref 1.9–3.7)
Glucose, Bld: 118 mg/dL — ABNORMAL HIGH (ref 65–99)
Potassium: 4.2 mmol/L (ref 3.5–5.3)
Sodium: 137 mmol/L (ref 135–146)
Total Bilirubin: 0.3 mg/dL (ref 0.2–1.2)
Total Protein: 7.4 g/dL (ref 6.1–8.1)
eGFR: 68 mL/min/{1.73_m2} (ref >=60)

## 2023-05-19 NOTE — Progress Notes (Signed)
Glucose is 118.  Rest of CMP WNL.  CBC WNL.

## 2023-05-21 DIAGNOSIS — M25551 Pain in right hip: Secondary | ICD-10-CM | POA: Diagnosis not present

## 2023-05-21 DIAGNOSIS — Z4789 Encounter for other orthopedic aftercare: Secondary | ICD-10-CM | POA: Diagnosis not present

## 2023-05-25 ENCOUNTER — Ambulatory Visit: Payer: PPO | Attending: Physician Assistant | Admitting: Physician Assistant

## 2023-05-25 ENCOUNTER — Encounter: Payer: Self-pay | Admitting: Physician Assistant

## 2023-05-25 VITALS — BP 122/83 | HR 87 | Resp 13 | Ht 64.0 in | Wt 198.0 lb

## 2023-05-25 DIAGNOSIS — E785 Hyperlipidemia, unspecified: Secondary | ICD-10-CM

## 2023-05-25 DIAGNOSIS — M19072 Primary osteoarthritis, left ankle and foot: Secondary | ICD-10-CM

## 2023-05-25 DIAGNOSIS — M19041 Primary osteoarthritis, right hand: Secondary | ICD-10-CM

## 2023-05-25 DIAGNOSIS — M0609 Rheumatoid arthritis without rheumatoid factor, multiple sites: Secondary | ICD-10-CM | POA: Diagnosis not present

## 2023-05-25 DIAGNOSIS — M19071 Primary osteoarthritis, right ankle and foot: Secondary | ICD-10-CM

## 2023-05-25 DIAGNOSIS — M25511 Pain in right shoulder: Secondary | ICD-10-CM

## 2023-05-25 DIAGNOSIS — M7061 Trochanteric bursitis, right hip: Secondary | ICD-10-CM | POA: Diagnosis not present

## 2023-05-25 DIAGNOSIS — E118 Type 2 diabetes mellitus with unspecified complications: Secondary | ICD-10-CM

## 2023-05-25 DIAGNOSIS — M8589 Other specified disorders of bone density and structure, multiple sites: Secondary | ICD-10-CM

## 2023-05-25 DIAGNOSIS — E1136 Type 2 diabetes mellitus with diabetic cataract: Secondary | ICD-10-CM

## 2023-05-25 DIAGNOSIS — Z96653 Presence of artificial knee joint, bilateral: Secondary | ICD-10-CM

## 2023-05-25 DIAGNOSIS — T466X5A Adverse effect of antihyperlipidemic and antiarteriosclerotic drugs, initial encounter: Secondary | ICD-10-CM

## 2023-05-25 DIAGNOSIS — M19042 Primary osteoarthritis, left hand: Secondary | ICD-10-CM

## 2023-05-25 DIAGNOSIS — Z79899 Other long term (current) drug therapy: Secondary | ICD-10-CM | POA: Diagnosis not present

## 2023-05-25 DIAGNOSIS — Z720 Tobacco use: Secondary | ICD-10-CM

## 2023-05-25 DIAGNOSIS — E1169 Type 2 diabetes mellitus with other specified complication: Secondary | ICD-10-CM

## 2023-05-25 DIAGNOSIS — I152 Hypertension secondary to endocrine disorders: Secondary | ICD-10-CM

## 2023-05-25 DIAGNOSIS — Z789 Other specified health status: Secondary | ICD-10-CM

## 2023-05-25 DIAGNOSIS — R5383 Other fatigue: Secondary | ICD-10-CM

## 2023-05-25 DIAGNOSIS — E559 Vitamin D deficiency, unspecified: Secondary | ICD-10-CM | POA: Diagnosis not present

## 2023-05-25 DIAGNOSIS — G8929 Other chronic pain: Secondary | ICD-10-CM

## 2023-05-25 DIAGNOSIS — Z96641 Presence of right artificial hip joint: Secondary | ICD-10-CM

## 2023-05-25 DIAGNOSIS — Z8709 Personal history of other diseases of the respiratory system: Secondary | ICD-10-CM

## 2023-05-25 DIAGNOSIS — M791 Myalgia, unspecified site: Secondary | ICD-10-CM

## 2023-05-25 DIAGNOSIS — H47013 Ischemic optic neuropathy, bilateral: Secondary | ICD-10-CM

## 2023-05-25 DIAGNOSIS — E1159 Type 2 diabetes mellitus with other circulatory complications: Secondary | ICD-10-CM | POA: Diagnosis not present

## 2023-05-25 MED ORDER — METHOTREXATE SODIUM 2.5 MG PO TABS: ORAL_TABLET | ORAL | 0 refills | Status: DC

## 2023-05-25 NOTE — Patient Instructions (Addendum)
Standing Labs We placed an order today for your standing lab work.   Please have your standing labs drawn in mid-April and every 3 months    Please have your labs drawn 2 weeks prior to your appointment so that the provider can discuss your lab results at your appointment, if possible.  Please note that you may see your imaging and lab results in [facility] before we have reviewed them. We will contact you once all results are reviewed. Please allow our office up to 72 hours to thoroughly review all of the results before contacting the office for clarification of your results.  WALK-IN LAB HOURS  Monday through Thursday from 8:00 am -12:30 pm and 1:00 pm-5:00 pm and Friday from 8:00 am-12:00 pm.  Patients with office visits requiring labs will be seen before walk-in labs.  You may encounter longer than normal wait times. Please allow additional time. Wait times may be shorter on  Monday and Thursday afternoons.  We do not book appointments for walk-in labs. We appreciate your patience and understanding with our staff.   Labs are drawn by [facility]. Please bring your co-pay at the time of your lab draw.  You may receive a bill from [facility] for your lab work.  Please note if you are on Hydroxychloroquine and and an order has been placed for a Hydroxychloroquine level,  you will need to have it drawn 4 hours or more after your last dose.  If you wish to have your labs drawn at another location, please call the office 24 hours in advance so we can fax the orders.  The office is located at [address] [place], [place], [place] [address]   If you have any questions regarding directions or hours of operation,  please call [phone].   As a reminder, please drink plenty of water prior to coming for your lab work. Thanks!

## 2023-05-28 DIAGNOSIS — M25551 Pain in right hip: Secondary | ICD-10-CM | POA: Diagnosis not present

## 2023-05-28 DIAGNOSIS — Z4789 Encounter for other orthopedic aftercare: Secondary | ICD-10-CM | POA: Diagnosis not present

## 2023-06-04 DIAGNOSIS — M25551 Pain in right hip: Secondary | ICD-10-CM | POA: Diagnosis not present

## 2023-06-04 DIAGNOSIS — Z4789 Encounter for other orthopedic aftercare: Secondary | ICD-10-CM | POA: Diagnosis not present

## 2023-06-11 DIAGNOSIS — Z4789 Encounter for other orthopedic aftercare: Secondary | ICD-10-CM | POA: Diagnosis not present

## 2023-06-11 DIAGNOSIS — M25551 Pain in right hip: Secondary | ICD-10-CM | POA: Diagnosis not present

## 2023-06-27 IMAGING — MG MM DIGITAL SCREENING BILAT W/ TOMO AND CAD
8 series · 8 of 24 positions shown · non-contrast
Comparison: Previous exam(s).

CLINICAL DATA: Screening.

EXAM:
DIGITAL SCREENING BILATERAL MAMMOGRAM WITH TOMOSYNTHESIS AND CAD
TECHNIQUE: Bilateral screening digital craniocaudal and mediolateral oblique
mammograms were obtained. Bilateral screening digital breast
tomosynthesis was performed. The images were evaluated with
computer-aided detection.

[L MLO synth-2D]
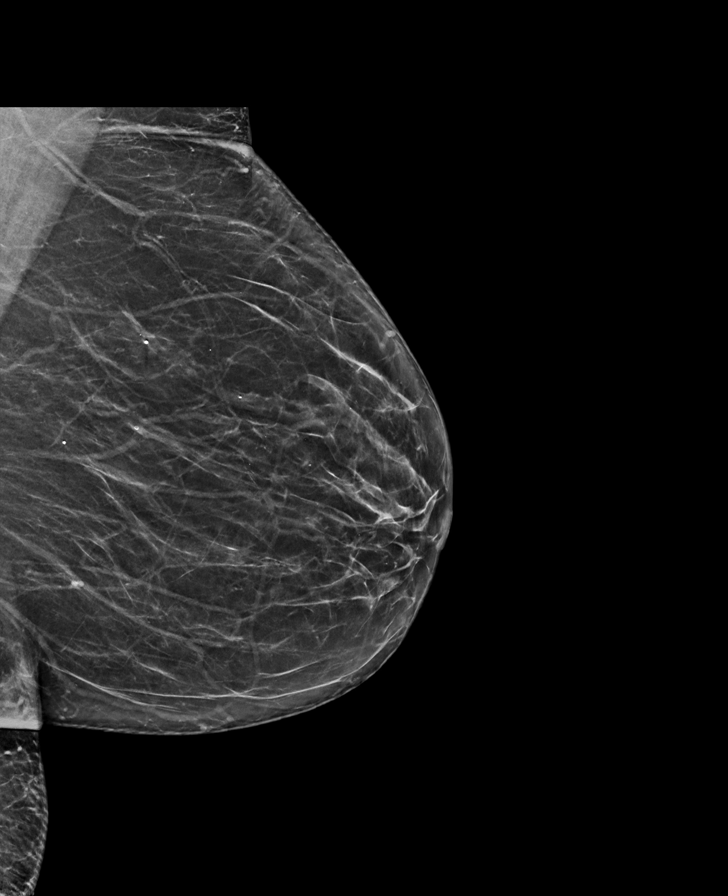

[R CC synth-2D]
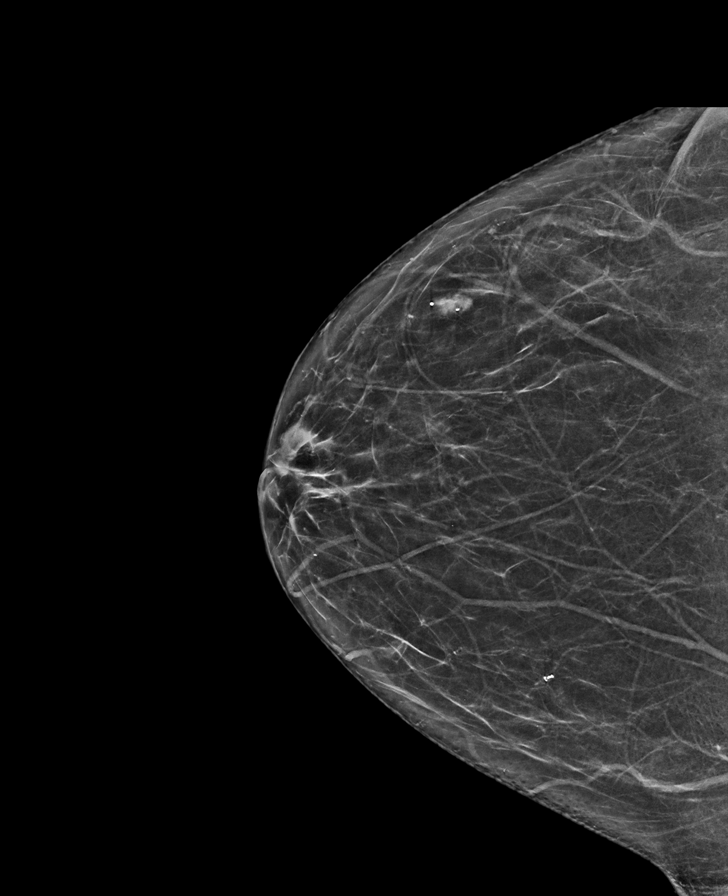

[R MLO synth-2D]
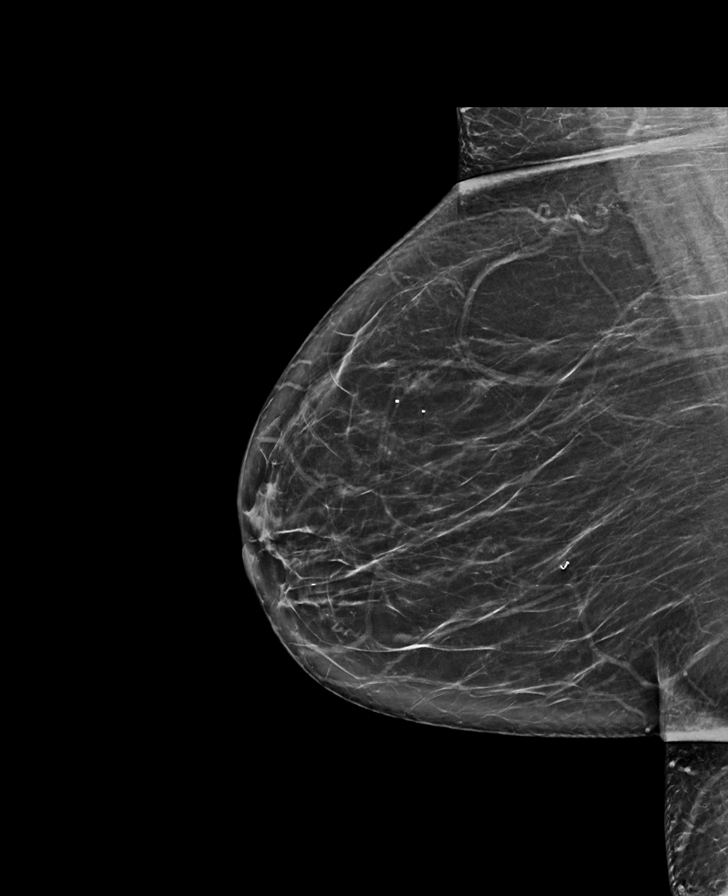

[L CC synth-2D]
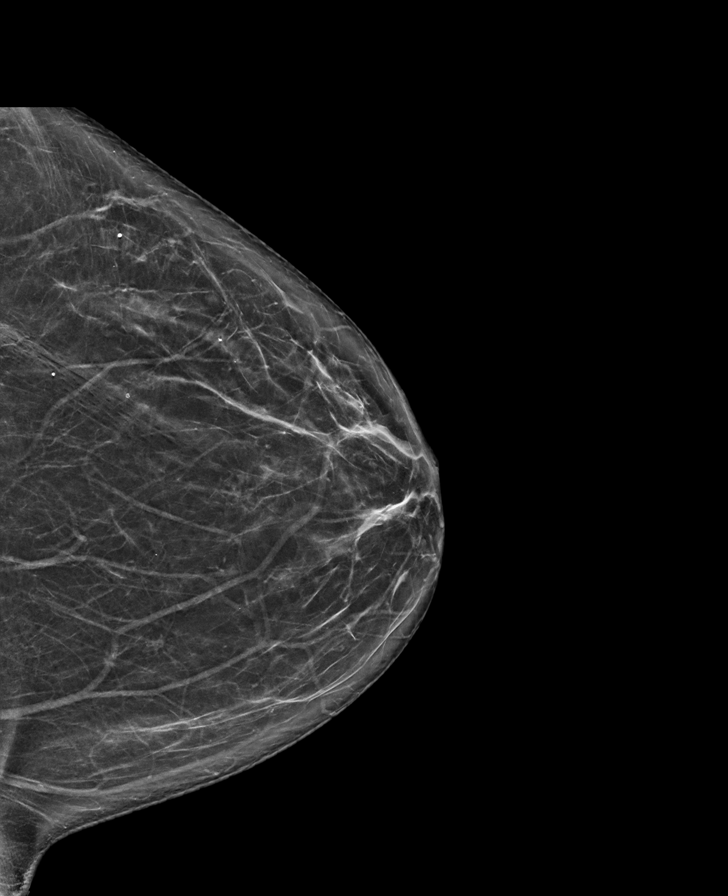

[R CC tomo · tomo slice 34/67.0]
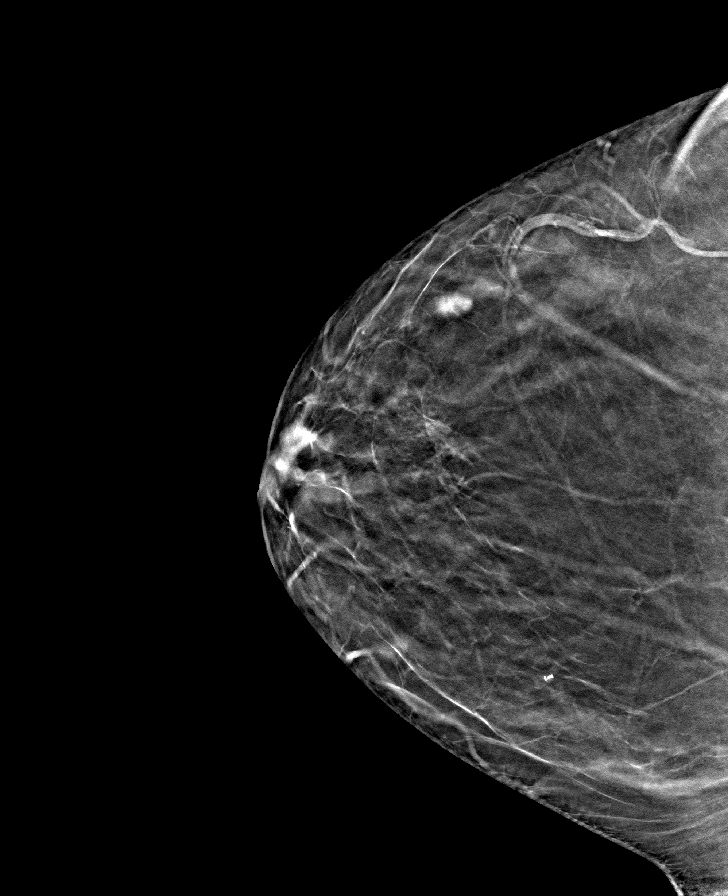

[R MLO tomo · tomo slice 39/77.0]
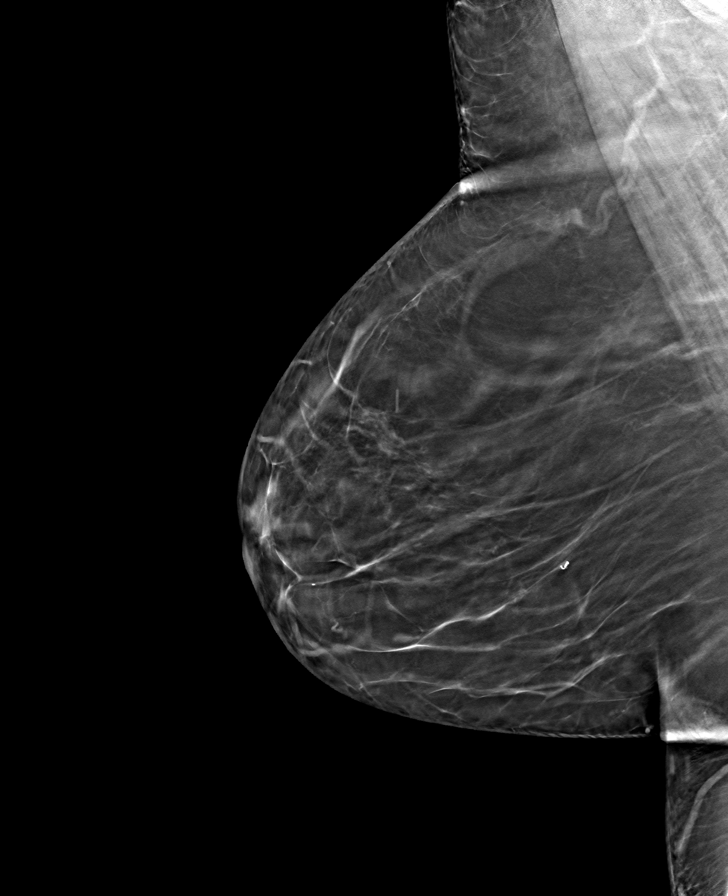

[L MLO tomo · tomo slice 36/71.0]
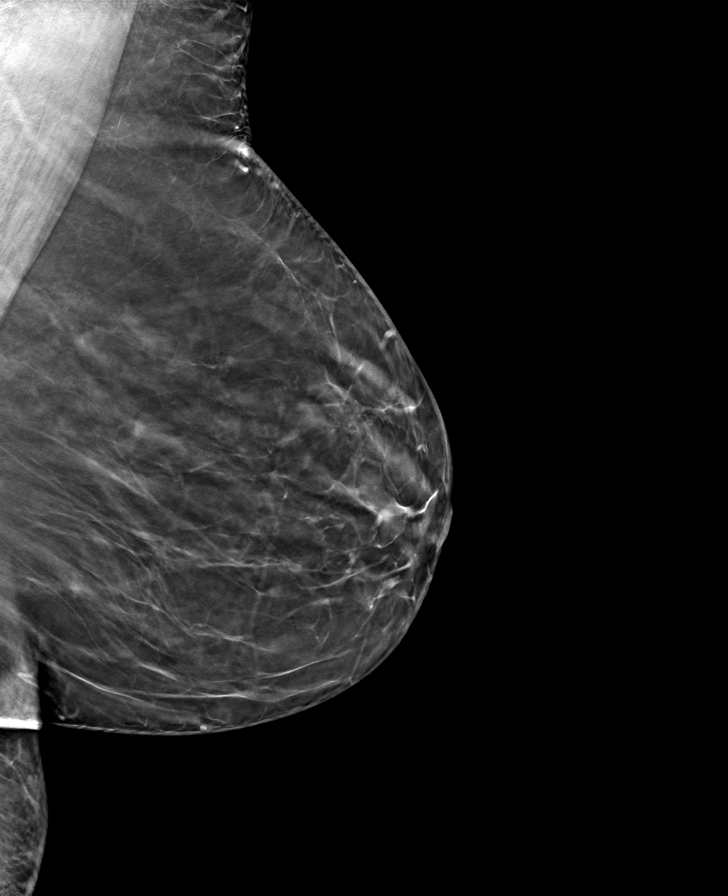

[L CC tomo · tomo slice 37/73.0]
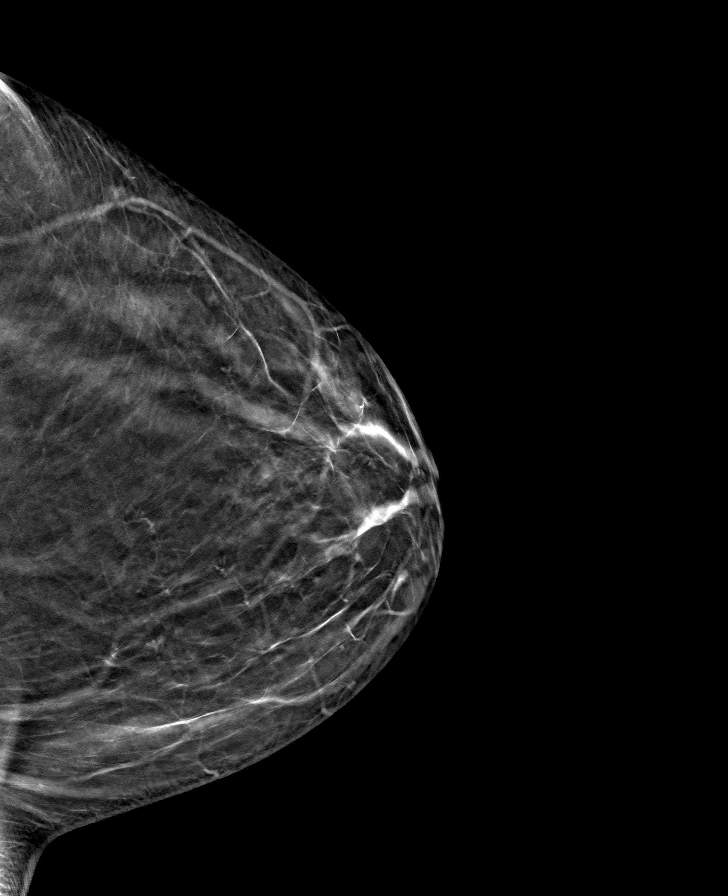

[8 of 24 positions shown; findings below may reference images not displayed]

ACR Breast Density Category b: There are scattered areas of
fibroglandular density.
FINDINGS: There are no findings suspicious for malignancy.
IMPRESSION: No mammographic evidence of malignancy. A result letter of this
screening mammogram will be mailed directly to the patient.

RECOMMENDATION:
Screening mammogram in one year. (Code:51-O-LD2)

BI-RADS CATEGORY  1: Negative.

## 2023-07-21 DIAGNOSIS — M25551 Pain in right hip: Secondary | ICD-10-CM | POA: Diagnosis not present

## 2023-08-29 ENCOUNTER — Other Ambulatory Visit: Payer: Self-pay | Admitting: Physician Assistant

## 2023-08-29 ENCOUNTER — Other Ambulatory Visit: Payer: Self-pay | Admitting: Family Medicine

## 2023-08-30 NOTE — Telephone Encounter (Signed)
 Last Fill: 05/25/2023  Labs: 05/18/2023 Glucose is 118. Rest of CMP WNL. CBC WNL.     Next Visit: 11/09/2023  Last Visit: 05/25/2023  DX: Rheumatoid arthritis of multiple sites with negative rheumatoid factor   Current Dose per office note 05/25/2023: Methotrexate  7 tablets once weekly   Okay to refill Methotrexate ?

## 2023-08-31 DIAGNOSIS — E119 Type 2 diabetes mellitus without complications: Secondary | ICD-10-CM | POA: Diagnosis not present

## 2023-08-31 DIAGNOSIS — H25813 Combined forms of age-related cataract, bilateral: Secondary | ICD-10-CM | POA: Diagnosis not present

## 2023-08-31 DIAGNOSIS — H47013 Ischemic optic neuropathy, bilateral: Secondary | ICD-10-CM | POA: Diagnosis not present

## 2023-08-31 LAB — HM DIABETES EYE EXAM

## 2023-09-13 ENCOUNTER — Other Ambulatory Visit: Payer: Self-pay | Admitting: *Deleted

## 2023-09-13 DIAGNOSIS — Z79899 Other long term (current) drug therapy: Secondary | ICD-10-CM | POA: Diagnosis not present

## 2023-09-13 LAB — COMPLETE METABOLIC PANEL WITHOUT GFR
AG Ratio: 1.6 (calc) (ref 1.0–2.5)
ALT: 12 U/L (ref 6–29)
AST: 20 U/L (ref 10–35)
Albumin: 4.4 g/dL (ref 3.6–5.1)
Alkaline phosphatase (APISO): 40 U/L (ref 37–153)
BUN: 22 mg/dL (ref 7–25)
CO2: 29 mmol/L (ref 20–32)
Calcium: 9.2 mg/dL (ref 8.6–10.4)
Chloride: 104 mmol/L (ref 98–110)
Creat: 0.89 mg/dL (ref 0.60–1.00)
Globulin: 2.8 g/dL (ref 1.9–3.7)
Glucose, Bld: 155 mg/dL — ABNORMAL HIGH (ref 65–99)
Potassium: 4.4 mmol/L (ref 3.5–5.3)
Sodium: 138 mmol/L (ref 135–146)
Total Bilirubin: 0.4 mg/dL (ref 0.2–1.2)
Total Protein: 7.2 g/dL (ref 6.1–8.1)

## 2023-09-13 LAB — CBC WITH DIFFERENTIAL/PLATELET
Absolute Lymphocytes: 1664 {cells}/uL (ref 850–3900)
Absolute Monocytes: 342 {cells}/uL (ref 200–950)
Basophils Absolute: 51 {cells}/uL (ref 0–200)
Basophils Relative: 0.9 %
Eosinophils Absolute: 291 {cells}/uL (ref 15–500)
Eosinophils Relative: 5.1 %
HCT: 40.5 % (ref 35.0–45.0)
Hemoglobin: 13.5 g/dL (ref 11.7–15.5)
MCH: 29.7 pg (ref 27.0–33.0)
MCHC: 33.3 g/dL (ref 32.0–36.0)
MCV: 89 fL (ref 80.0–100.0)
MPV: 10 fL (ref 7.5–12.5)
Monocytes Relative: 6 %
Neutro Abs: 3352 {cells}/uL (ref 1500–7800)
Neutrophils Relative %: 58.8 %
Platelets: 314 10*3/uL (ref 140–400)
RBC: 4.55 10*6/uL (ref 3.80–5.10)
RDW: 14 % (ref 11.0–15.0)
Total Lymphocyte: 29.2 %
WBC: 5.7 10*3/uL (ref 3.8–10.8)

## 2023-09-13 NOTE — Progress Notes (Signed)
 CBC WNL

## 2023-09-14 ENCOUNTER — Ambulatory Visit: Payer: Self-pay | Admitting: *Deleted

## 2023-09-14 NOTE — Progress Notes (Signed)
 Glucose is elevated-155. Rest of CMP WNL.

## 2023-09-15 ENCOUNTER — Ambulatory Visit (INDEPENDENT_AMBULATORY_CARE_PROVIDER_SITE_OTHER): Payer: PPO | Admitting: *Deleted

## 2023-09-15 DIAGNOSIS — Z0000 Encounter for general adult medical examination without abnormal findings: Secondary | ICD-10-CM | POA: Diagnosis not present

## 2023-09-15 NOTE — Progress Notes (Signed)
 Subjective:   [name] is a 78 y.o. female who presents for Medicare Annual (Subsequent) preventive examination.  Visit Complete: Virtual I connected with  [name] on 09/15/23 by a audio enabled telemedicine application and verified that I am speaking with the correct person using two identifiers.  Patient Location: Home  Provider Location: Home Office  I discussed the limitations of evaluation and management by telemedicine. The patient expressed understanding and agreed to proceed.  Vital Signs: Because this visit was a virtual/telehealth visit, some criteria may be missing or patient reported. Any vitals not documented were not able to be obtained and vitals that have been documented are patient reported.  Patient Medicare AWV questionnaire was completed by the patient on 09-14-2023; I have confirmed that all information answered by patient is correct and no changes since this date.  Cardiac Risk Factors include: advanced age (>46men, >40 women);diabetes mellitus;smoking/ tobacco exposure     Objective:     Today's Vitals   09/15/23 1052  PainSc: 5    There is no height or weight on file to calculate BMI.     09/15/2023   10:55 AM 04/07/2023   11:22 AM 09/09/2022   10:41 AM 07/30/2021   11:08 AM 02/21/2020    8:20 AM 10/18/2018   10:13 AM 08/05/2015   10:11 AM  Advanced Directives  Does Patient Have a Medical Advance Directive? Yes Yes Yes Yes Yes Yes Yes  Type of Estate agent of [employer] Power of [place];Living will Healthcare Power of [employer] of [place];Living will Healthcare Power of [place];Living will Healthcare Power of [place];Living will  Does patient want to make changes to medical advance directive? No - Patient declined No - Patient declined No - Patient declined   No - Patient declined   Copy of Healthcare Power of Attorney in Chart?   Yes - validated most recent copy scanned in chart (See row  information) Yes - validated most recent copy scanned in chart (See row information) Yes - validated most recent copy scanned in chart (See row information) Yes - validated most recent copy scanned in chart (See row information) No - copy requested    Current Medications (verified) Outpatient Encounter Medications as of 09/15/2023  Medication Sig   acetaminophen  (TYLENOL ) 500 MG tablet Take 500 mg by mouth daily as needed for moderate pain (pain score 4-6).   aspirin  EC 81 MG tablet Take 1 tablet (81 mg total) by mouth 2 (two) times daily after a meal. For 2 weeks then back to once a day after that for DVT prevention.   cholecalciferol (VITAMIN D3) 25 MCG (1000 UNIT) tablet Take 1,000 Units by mouth daily.   fenofibrate  160 MG tablet Take 1 tablet (160 mg total) by mouth daily.   folic acid  (FOLVITE ) 1 MG tablet TAKE 2 TABLETS BY MOUTH EVERY DAY   losartan  (COZAAR ) 25 MG tablet Take 12.5 mg by mouth daily.   metFORMIN  (GLUCOPHAGE ) 500 MG tablet Take 1 tablet (500 mg total) by mouth 2 (two) times daily.   methotrexate  (RHEUMATREX) 2.5 MG tablet TAKE 7 TABLETS BY MOUTH ONCE WEEKLY. CAUTION:CHEMOTHERAPY. PROTECT FROM LIGHT.   Omega-3 Fatty Acids (FISH OIL) 1000 MG CAPS Take 1,000 mg by mouth 2 (two) times daily.   pravastatin  (PRAVACHOL ) 20 MG tablet Take 1 tablet (20 mg total) by mouth daily.   sitaGLIPtin  (JANUVIA ) 50 MG tablet Take 1 tablet (50 mg total) by mouth daily.   zolpidem  (AMBIEN )  5 MG tablet Take 1 tablet (5 mg total) by mouth at bedtime as needed for sleep. (Patient taking differently: Take 1.6 mg by mouth at bedtime as needed for sleep.)   HYDROcodone -acetaminophen  (NORCO/VICODIN) 5-325 MG tablet Take 1-2 tablets by mouth every 6 (six) hours as needed for moderate pain (pain score 4-6) or severe pain (pain score 7-10) (post op pain). (Patient not taking: Reported on 05/25/2023)   tiZANidine  (ZANAFLEX ) 4 MG tablet Take 1 tablet (4 mg total) by mouth every 6 (six) hours as needed for  muscle spasms.   No facility-administered encounter medications on file as of 09/15/2023.    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   Diverticulitis    Family history of colonic polyps 05/16/2020   History of blood transfusion    Hyperlipidemia    Hypertension    Insomnia    Internal hemorrhoids 05/16/2020   Nasal polyps    Obesity    PONV (postoperative nausea and vomiting)    Radial styloid tenosynovitis of left hand 12/21/2019   Rectal bleeding 05/16/2020   Sinusitis, chronic 03/18/2010   Qualifier: Diagnosis of  By: Alwin Baars MD, Susana Enter     Past Surgical History:  Procedure Laterality Date   ABDOMINAL HYSTERECTOMY  2000   APPENDECTOMY     CARDIAC CATHETERIZATION N/A 03/27/2015   Procedure: Left Heart Cath and Coronary Angiography;  Surgeon: Knox Perl, MD;  Location: [facility] INVASIVE CV LAB;  Service: Cardiovascular;  Laterality: N/A;   CESAREAN SECTION     ELBOW SURGERY Right    FIXATION KYPHOPLASTY LUMBAR SPINE  10/16/2020   L2 and L3 kyphoplasty   HAND TENDON SURGERY Left 02/04/2022   KYPHOPLASTY N/A 10/16/2020   Procedure: LUMBAR 2 AND LUMBAR 3 KYPHOPLASTY;  Surgeon: Virl Grimes, MD;  Location: [facility] OR;  Service: Orthopedics;  Laterality: N/A;   NASAL SINUS SURGERY     REPLACEMENT TOTAL KNEE Bilateral 2007 2009   TONSILLECTOMY AND ADENOIDECTOMY     TOTAL HIP ARTHROPLASTY Right 04/20/2023   Procedure: TOTAL HIP ARTHROPLASTY ANTERIOR APPROACH;  Surgeon: Dayne Even, MD;  Location: [facility] ORS;  Service: Orthopedics;  Laterality: Right;   WISDOM TOOTH EXTRACTION     Family History  Problem  Relation Age of Onset   Arthritis Mother    Diabetes Mother        Type I   Aneurysm Father    Stroke Father    Arthritis Sister    Arthritis Sister    Down syndrome Son    Diabetes Son        type I   Social History   Socioeconomic History   Marital status: Widowed    Spouse name: Not on file   Number of children: Not on file   Years of education: Not on file   Highest education level: Not on file  Occupational History   Not on file  Tobacco Use   Smoking status: Every Day    Current packs/day: 0.50    Average packs/day: 0.5 packs/day for 35.0 years (17.5 ttl pk-yrs)  Types: Cigarettes    Passive exposure: Never   Smokeless tobacco: Never   Tobacco comments:    6 cigarettes per day   Vaping Use   Vaping status: Never Used  Substance and Sexual Activity   Alcohol use: No    Alcohol/week: 0.0 standard drinks of alcohol   Drug use: No   Sexual activity: Not Currently  Other Topics Concern   Not on file  Social History Narrative   Marital status/children/pets: Married, 1 child.    Education/employment: Automotive engineer educated, retired.   Safety:      -smoke alarm in the home:Yes     - wears seatbelt: Yes     - Feels safe in their relationships: Yes   Social Drivers of Corporate investment banker Strain: Low Risk  (09/15/2023)   Overall Financial Resource Strain (CARDIA)    Difficulty of Paying Living Expenses: Not hard at all  Food Insecurity: No Food Insecurity (09/15/2023)   Hunger Vital Sign    Worried About Running Out of Food in the Last Year: Never true    Ran Out of Food in the Last Year: Never true  Transportation Needs: No Transportation Needs (09/15/2023)   PRAPARE - Administrator, Civil Service (Medical): No    Lack of Transportation (Non-Medical): No  Physical Activity: Sufficiently Active (09/15/2023)   Exercise Vital Sign    Days of Exercise per Week: 2 days    Minutes of Exercise per Session: 150+ min  Stress: No Stress Concern Present  (09/15/2023)   [employer] of Occupational Health - Occupational Stress Questionnaire    Feeling of Stress : Not at all  Social Connections: Socially Isolated (09/15/2023)   Social Connection and Isolation Panel [NHANES]    Frequency of Communication with Friends and Family: More than three times a week    Frequency of Social Gatherings with Friends and Family: More than three times a week    Attends Religious Services: Never    Database administrator or Organizations: No    Attends Banker Meetings: Never    Marital Status: Widowed    Tobacco Counseling Ready to quit: Not Answered Counseling given: Not Answered Tobacco comments: 6 cigarettes per day    Clinical Intake:  Pre-visit preparation completed: Yes  Pain : 0-10 Pain Score: 5  Pain Location: Hip Pain Orientation: Left Pain Descriptors / Indicators: Aching, Burning, Dull Pain Onset: More than a month ago Pain Frequency: Intermittent     Diabetes: Yes CBG done?: No Did pt. bring in CBG monitor from home?: No  How often do you need to have someone help you when you read instructions, pamphlets, or other written materials from your doctor or pharmacy?: 1 - Never     Information entered by :: Kieth Pelt LPN   Activities of Daily Living    09/15/2023   10:55 AM 09/14/2023    4:12 PM  In your present state of health, do you have any difficulty performing the following activities:  Hearing? 0 0  Vision? 0 0  Difficulty concentrating or making decisions? 0 0  Walking or climbing stairs? 0 0  Dressing or bathing? 0 0  Doing errands, shopping? 0 0  Preparing Food and eating ? N N  Using the Toilet? N N  In the past six months, have you accidently leaked urine? N N  Do you have problems with loss of bowel control? N N  Managing your Medications? N N  Managing  your Finances? N N  Housekeeping or managing your Housekeeping? N N    Patient Care Team: Mariel Shope, DO as PCP - General  (Family Medicine) Nicholas Bari, MD as Consulting Physician (Rheumatology) Tami Falcon, MD as Consulting Physician (Gastroenterology) Janee Mech, Arrie Bienenstock, OD (Optometry) Janee Mech, Arrie Bienenstock, OD (Optometry)  Indicate any recent Medical Services you may have received from other than [facility] providers in the past year (date may be approximate).     Assessment:    This is a routine wellness examination for [name].  Hearing/Vision screen Hearing Screening - Comments:: No trouble hearing Vision Screening - Comments:: Bull Up to date   Goals Addressed             This Visit's Progress    Patient Stated   Not on track    Increase activity & start going back to the Gym     Patient Stated   On track    None at this time      Patient Stated   Not on track    Lose weight      Patient Stated       Continue current lifestyle       Depression Screen    09/15/2023   10:57 AM 09/09/2022   10:39 AM 04/15/2022    1:06 PM 07/30/2021   11:07 AM 02/21/2020    8:22 AM 02/15/2020    9:54 AM 10/26/2019    8:57 AM  PHQ 2/9 Scores  PHQ - 2 Score 3 0 0 0 0 0 0  PHQ- 9 Score 9          Fall Risk    09/15/2023   10:55 AM 09/14/2023    4:12 PM 09/09/2022   10:42 AM 05/09/2022    9:13 PM 04/15/2022    1:06 PM  Fall Risk   Falls in the past year? 0 0 1 0 0  Number falls in past yr: 0 0 1 0 0  Injury with Fall? 0 0 0 0 0  Risk for fall due to :   Impaired vision  No Fall Risks  Follow up Falls evaluation completed;Education provided;Falls prevention discussed  Falls prevention discussed  Falls evaluation completed    MEDICARE RISK AT HOME: Medicare Risk at Home Any stairs in or around the home?: Yes If so, are there any without handrails?: No Home free of loose throw rugs in walkways, pet beds, electrical cords, etc?: Yes Adequate lighting in your home to reduce risk of falls?: Yes Life alert?: No Use of a cane, walker or w/c?: No Grab bars in the bathroom?: No Shower chair or  bench in shower?: Yes Elevated toilet seat or a handicapped toilet?: Yes  TIMED UP AND GO:  Was the test performed?  No    Cognitive Function:        09/15/2023   10:56 AM 09/09/2022   10:42 AM 07/30/2021   11:13 AM  6CIT Screen  What Year? 0 points 0 points 0 points  What month? 0 points 0 points 0 points  What time? 0 points 0 points 0 points  Count back from 20 0 points 0 points 0 points  Months in reverse 0 points 0 points 0 points  Repeat phrase 0 points 0 points 0 points  Total Score 0 points 0 points 0 points    Immunizations Immunization History  Administered Date(s) Administered   Fluad Quad(high Dose 65+) 01/11/2019, 02/15/2020, 03/06/2022   Fluad Trivalent(High Dose 65+) 03/17/2023  Influenza Whole 04/21/2010   Influenza, High Dose Seasonal PF 02/20/2013, 02/07/2018, 01/30/2021   Influenza,inj,Quad PF,6+ Mos 02/23/2017   Influenza-Unspecified 02/15/2014, 01/30/2016, 02/07/2018, 01/11/2019   PFIZER Comirnaty(Gray Top)Covid-19 Tri-Sucrose Vaccine 10/09/2020   PFIZER(Purple Top)SARS-COV-2 Vaccination 06/08/2019, 07/03/2019, 01/04/2020   PNEUMOCOCCAL CONJUGATE-20 05/13/2021   Pfizer Covid-19 Vaccine Bivalent Booster 58yrs & up 03/24/2021   Pfizer(Comirnaty)Fall Seasonal Vaccine 12 years and older 03/27/2022, 01/06/2023   Pneumococcal Conjugate-13 02/20/2013   Pneumococcal Polysaccharide-23 04/05/2006   Pneumococcal-Unspecified 03/05/2015   Tdap 09/14/2001, 03/05/2015   Tetanus 03/27/2014   Zoster Recombinant(Shingrix) 10/12/2017, 02/07/2018   Zoster, Live 12/07/2013    TDAP status: Up to date  Flu Vaccine status: Up to date  Pneumococcal vaccine status: Up to date  Covid-19 vaccine status: Information provided on how to obtain vaccines.   Qualifies for Shingles Vaccine? No   Zostavax completed Yes   Shingrix Completed?: Yes  Screening Tests Health Maintenance  Topic Date Due   HEMOGLOBIN A1C  09/14/2023   Diabetic kidney evaluation - Urine ACR   09/23/2023   INFLUENZA VACCINE  12/03/2023   FOOT EXAM  03/16/2024   MAMMOGRAM  05/11/2024   OPHTHALMOLOGY EXAM  08/30/2024   Diabetic kidney evaluation - eGFR measurement  09/12/2024   Medicare Annual Wellness (AWV)  09/14/2024   DTaP/Tdap/Td (4 - Td or Tdap) 03/04/2025   Pneumonia Vaccine 12+ Years old  Completed   DEXA SCAN  Completed   Hepatitis C Screening  Completed   Zoster Vaccines- Shingrix  Completed   HPV VACCINES  Aged Out   Meningococcal B Vaccine  Aged Out   Colonoscopy  Discontinued   COVID-19 Vaccine  Discontinued    Health Maintenance  Health Maintenance Due  Topic Date Due   HEMOGLOBIN A1C  09/14/2023   Diabetic kidney evaluation - Urine ACR  09/23/2023    Colorectal cancer screening: No longer required.   Mammogram status: Completed  . Repeat every year  Bone Density status: Completed 2023. Results reflect: Bone density results: OSTEOPENIA. Repeat every 3 years.  Lung Cancer Screening: (Low Dose CT Chest recommended if Age 60-80 years, 20 pack-year currently smoking OR have quit w/in 15years.) does qualify.   Lung Cancer Screening Referral: declined  Additional Screening:  Hepatitis C Screening: does not qualify; Completed 2021  Vision Screening: Recommended annual ophthalmology exams for early detection of glaucoma and other disorders of the eye. Is the patient up to date with their annual eye exam?  Yes  Who is the provider or what is the name of the office in which the patient attends annual eye exams? bull If pt is not established with a provider, would they like to be referred to a provider to establish care? No .   Dental Screening: Recommended annual dental exams for proper oral hygiene Nutrition Risk Assessment:  Has the patient had any N/V/D within the last 2 months?  No  Does the patient have any non-healing wounds?  No  Has the patient had any unintentional weight loss or weight gain?  No   Diabetes:  Is the patient diabetic?  No   If diabetic, was a CBG obtained today?  No  Did the patient bring in their glucometer from home?  No  How often do you monitor your CBG's? 1 x a week.   Financial Strains and Diabetes Management:  Are you having any financial strains with the device, your supplies or your medication? No .  Does the patient want to be seen by Chronic Care Management  for management of their diabetes?  No  Would the patient like to be referred to a Nutritionist or for Diabetic Management?  No   Diabetic Exams:  Diabetic Eye Exam: Completed .  Diabetic Foot Exam: Completed . Pt has been advised about the importance in completing this exam. P Community Resource Referral / Chronic Care Management: CRR required this visit?  No   CCM required this visit?  No     Plan:     I have personally reviewed and noted the following in the patient's chart:   Medical and social history Use of alcohol, tobacco or illicit drugs  Current medications and supplements including opioid prescriptions. Patient is not currently taking opioid prescriptions. Functional ability and status Nutritional status Physical activity Advanced directives List of other physicians Hospitalizations, surgeries, and ER visits in previous 12 months Vitals Screenings to include cognitive, depression, and falls Referrals and appointments  In addition, I have reviewed and discussed with patient certain preventive protocols, quality metrics, and best practice recommendations. A written personalized care plan for preventive services as well as general preventive health recommendations were provided to patient.     Kieth Pelt, LPN   1/61/0960   After Visit Summary: (MyChart) Due to this being a telephonic visit, the after visit summary with patients personalized plan was offered to patient via MyChart   Nurse Notes:

## 2023-09-15 NOTE — Patient Instructions (Signed)
 [name] , Thank you for taking time to come for your Medicare Wellness Visit. I appreciate your ongoing commitment to your health goals. Please review the following plan we discussed and let me know if I can assist you in the future.   Screening recommendations/referrals: Colonoscopy: no longer required Mammogram: up to date Bone Density: education provided Recommended yearly ophthalmology/optometry visit for glaucoma screening and checkup Recommended yearly dental visit for hygiene and checkup  Vaccinations: Influenza vaccine: up to date Pneumococcal vaccine: up to date Tdap vaccine: up to date        Preventive Care 65 Years and Older, Female Preventive care refers to lifestyle choices and visits with your health care provider that can promote health and wellness. What does preventive care include? A yearly physical exam. This is also called an annual well check. Dental exams once or twice a year. Routine eye exams. Ask your health care provider how often you should have your eyes checked. Personal lifestyle choices, including: Daily care of your teeth and gums. Regular physical activity. Eating a healthy diet. Avoiding tobacco and drug use. Limiting alcohol use. Practicing safe sex. Taking low-dose aspirin  every day. Taking vitamin and mineral supplements as recommended by your health care provider. What happens during an annual well check? The services and screenings done by your health care provider during your annual well check will depend on your age, overall health, lifestyle risk factors, and family history of disease. Counseling  Your health care provider may ask you questions about your: Alcohol use. Tobacco use. Drug use. Emotional well-being. Home and relationship well-being. Sexual activity. Eating habits. History of falls. Memory and ability to understand (cognition). Work and work Astronomer. Reproductive health. Screening  You may have the  following tests or measurements: Height, weight, and BMI. Blood pressure. Lipid and cholesterol levels. These may be checked every 5 years, or more frequently if you are over 30 years old. Skin check. Lung cancer screening. You may have this screening every year starting at age 52 if you have a 30-pack-year history of smoking and currently smoke or have quit within the past 15 years. Fecal occult blood test (FOBT) of the stool. You may have this test every year starting at age 1. Flexible sigmoidoscopy or colonoscopy. You may have a sigmoidoscopy every 5 years or a colonoscopy every 10 years starting at age 13. Hepatitis C blood test. Hepatitis B blood test. Sexually transmitted disease (STD) testing. Diabetes screening. This is done by checking your blood sugar (glucose) after you have not eaten for a while (fasting). You may have this done every 1-3 years. Bone density scan. This is done to screen for osteoporosis. You may have this done starting at age 32. Mammogram. This may be done every 1-2 years. Talk to your health care provider about how often you should have regular mammograms. Talk with your health care provider about your test results, treatment options, and if necessary, the need for more tests. Vaccines  Your health care provider may recommend certain vaccines, such as: Influenza vaccine. This is recommended every year. Tetanus, diphtheria, and acellular pertussis (Tdap, Td) vaccine. You may need a Td booster every 10 years. Zoster vaccine. You may need this after age 60. Pneumococcal 13-valent conjugate (PCV13) vaccine. One dose is recommended after age 55. Pneumococcal polysaccharide (PPSV23) vaccine. One dose is recommended after age 79. Talk to your health care provider about which screenings and vaccines you need and how often you need them. This information is not intended  to replace advice given to you by your health care provider. Make sure you discuss any questions you  have with your health care provider. Document Released: 05/17/2015 Document Revised: 01/08/2016 Document Reviewed: 02/19/2015 Elsevier Interactive Patient Education  2017 [employer].  Fall Prevention in the Home Falls can cause injuries. They can happen to people of all ages. There are many things you can do to make your home safe and to help prevent falls. What can I do on the outside of my home? Regularly fix the edges of walkways and driveways and fix any cracks. Remove anything that might make you trip as you walk through a door, such as a raised step or threshold. Trim any bushes or trees on the path to your home. Use bright outdoor lighting. Clear any walking paths of anything that might make someone trip, such as rocks or tools. Regularly check to see if handrails are loose or broken. Make sure that both sides of any steps have handrails. Any raised decks and porches should have guardrails on the edges. Have any leaves, snow, or ice cleared regularly. Use sand or salt on walking paths during winter. Clean up any spills in your garage right away. This includes oil or grease spills. What can I do in the bathroom? Use night lights. Install grab bars by the toilet and in the tub and shower. Do not use towel bars as grab bars. Use non-skid mats or decals in the tub or shower. If you need to sit down in the shower, use a plastic, non-slip stool. Keep the floor dry. Clean up any water that spills on the floor as soon as it happens. Remove soap buildup in the tub or shower regularly. Attach bath mats securely with double-sided non-slip rug tape. Do not have throw rugs and other things on the floor that can make you trip. What can I do in the bedroom? Use night lights. Make sure that you have a light by your bed that is easy to reach. Do not use any sheets or blankets that are too big for your bed. They should not hang down onto the floor. Have a firm chair that has side arms. You can  use this for support while you get dressed. Do not have throw rugs and other things on the floor that can make you trip. What can I do in the kitchen? Clean up any spills right away. Avoid walking on wet floors. Keep items that you use a lot in easy-to-reach places. If you need to reach something above you, use a strong step stool that has a grab bar. Keep electrical cords out of the way. Do not use floor polish or wax that makes floors slippery. If you must use wax, use non-skid floor wax. Do not have throw rugs and other things on the floor that can make you trip. What can I do with my stairs? Do not leave any items on the stairs. Make sure that there are handrails on both sides of the stairs and use them. Fix handrails that are broken or loose. Make sure that handrails are as long as the stairways. Check any carpeting to make sure that it is firmly attached to the stairs. Fix any carpet that is loose or worn. Avoid having throw rugs at the top or bottom of the stairs. If you do have throw rugs, attach them to the floor with carpet tape. Make sure that you have a light switch at the top of the stairs and  the bottom of the stairs. If you do not have them, ask someone to add them for you. What else can I do to help prevent falls? Wear shoes that: Do not have high heels. Have rubber bottoms. Are comfortable and fit you well. Are closed at the toe. Do not wear sandals. If you use a stepladder: Make sure that it is fully opened. Do not climb a closed stepladder. Make sure that both sides of the stepladder are locked into place. Ask someone to hold it for you, if possible. Clearly mark and make sure that you can see: Any grab bars or handrails. First and last steps. Where the edge of each step is. Use tools that help you move around (mobility aids) if they are needed. These include: Canes. Walkers. Scooters. Crutches. Turn on the lights when you go into a dark area. Replace any light  bulbs as soon as they burn out. Set up your furniture so you have a clear path. Avoid moving your furniture around. If any of your floors are uneven, fix them. If there are any pets around you, be aware of where they are. Review your medicines with your doctor. Some medicines can make you feel dizzy. This can increase your chance of falling. Ask your doctor what other things that you can do to help prevent falls. This information is not intended to replace advice given to you by your health care provider. Make sure you discuss any questions you have with your health care provider. Document Released: 02/14/2009 Document Revised: 09/26/2015 Document Reviewed: 05/25/2014 Elsevier Interactive Patient Education  2017 [employer].

## 2023-10-06 ENCOUNTER — Other Ambulatory Visit: Payer: Self-pay | Admitting: Family Medicine

## 2023-10-13 ENCOUNTER — Other Ambulatory Visit: Payer: Self-pay | Admitting: Family Medicine

## 2023-10-26 DIAGNOSIS — M1611 Unilateral primary osteoarthritis, right hip: Secondary | ICD-10-CM | POA: Diagnosis not present

## 2023-10-26 NOTE — Progress Notes (Signed)
 Office Visit Note  Patient: [name]             Date of Birth: Jul 27, 1945           MRN: [id number]             PCP: Catherine Charlies LABOR, DO Referring: Catherine Charlies LABOR, DO Visit Date: 11/09/2023 Occupation: @GUAROCC @  Subjective:  Medication management  History of Present Illness: [name] is a 78 y.o. female with seronegative rheumatoid arthritis and osteoarthritis.  She returns today after her last visit in January 2025.  She states that she did very well after the right total hip replacement in December 2024 by Dr. Dalldorf.  She has been having pain and discomfort in her left hip joint.  She had a cortisone injection which helped temporarily.  She will require left total hip replacement in the future.  None of the other joints are as painful.  She continues to have some stiffness in her shoulders and hands.  She has been taking methotrexate  7 tablets weekly along with folic acid  2 mg daily without any interruption.  She continues to take calcium and vitamin D  and no other treatment for osteopenia.    Activities of Daily Living:  Patient reports morning stiffness for 1 hour.   Patient Reports nocturnal pain.  Difficulty dressing/grooming: Denies Difficulty climbing stairs: Denies Difficulty getting out of chair: Denies Difficulty using hands for taps, buttons, cutlery, and/or writing: Denies  Review of Systems  Constitutional:  Negative for fatigue.  HENT:  Negative for mouth sores and mouth dryness.   Eyes:  Negative for dryness.  Respiratory:  Negative for shortness of breath.   Cardiovascular:  Negative for chest pain and palpitations.  Gastrointestinal:  Negative for blood in stool, constipation and diarrhea.  Endocrine: Negative for increased urination.  Genitourinary:  Negative for involuntary urination.  Musculoskeletal:  Positive for joint pain, joint pain, joint swelling, myalgias, muscle weakness, morning stiffness and myalgias. Negative for gait problem  and muscle tenderness.  Skin:  Negative for color change, rash, hair loss and sensitivity to sunlight.  Allergic/Immunologic: Negative for susceptible to infections.  Neurological:  Negative for dizziness and headaches.  Hematological:  Negative for swollen glands.  Psychiatric/Behavioral:  Positive for sleep disturbance. Negative for depressed mood. The patient is not nervous/anxious.     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   Hypermetropia of both eyes 04/22/2018   Hyperopia with presbyopia 04/22/2018   Osteopenia 02/14/2016   Benign hypertension 02/16/2012   Occasional cigarette smoker 04/07/2010   DM2 w/ Hyperlipidemia (HCC) 03/18/2010    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  Diverticulitis    Family history of colonic polyps 05/16/2020   History of blood transfusion    Hyperlipidemia    Hypertension    Insomnia    Internal hemorrhoids 05/16/2020   Nasal polyps    Obesity    PONV (postoperative nausea and vomiting)    Radial styloid tenosynovitis of left hand 12/21/2019   Rectal bleeding 05/16/2020   Sinusitis, chronic 03/18/2010   Qualifier: Diagnosis of  By: Eben MD, [place] History  Problem Relation Age of Onset   Arthritis Mother    Diabetes Mother        Type I   Aneurysm Father    Stroke Father    Arthritis Sister    Arthritis Sister    Down syndrome Son    Diabetes Son        type I   Past Surgical History:  Procedure Laterality Date   ABDOMINAL HYSTERECTOMY  2000   APPENDECTOMY     CARDIAC CATHETERIZATION N/A 03/27/2015   Procedure: Left Heart Cath and Coronary Angiography;  Surgeon: Gordy Bergamo, MD;  Location: [facility] INVASIVE CV LAB;  Service: Cardiovascular;  Laterality: N/A;   CESAREAN SECTION     ELBOW SURGERY Right    FIXATION KYPHOPLASTY LUMBAR SPINE  10/16/2020   L2 and L3 kyphoplasty   HAND TENDON SURGERY Left 02/04/2022   KYPHOPLASTY N/A 10/16/2020   Procedure: LUMBAR 2 AND LUMBAR 3 KYPHOPLASTY;  Surgeon: Beuford Anes, MD;  Location: [facility] OR;  Service: Orthopedics;  Laterality: N/A;   NASAL SINUS SURGERY     REPLACEMENT TOTAL KNEE Bilateral 2007 2009   TONSILLECTOMY AND ADENOIDECTOMY     TOTAL HIP ARTHROPLASTY Right 04/20/2023   Procedure: TOTAL HIP ARTHROPLASTY ANTERIOR APPROACH;  Surgeon: Sheril Coy, MD;  Location: [facility] ORS;  Service: Orthopedics;  Laterality: Right;   WISDOM TOOTH EXTRACTION     Social History   Social History Narrative   Marital status/children/pets: Married, 1 child.    Education/employment: Automotive engineer educated, retired.   Safety:      -smoke alarm in the home:Yes     - wears seatbelt: Yes     - Feels safe in their relationships: Yes   Immunization History  Administered Date(s) Administered   Fluad Quad(high Dose 65+) 01/11/2019, 02/15/2020, 03/06/2022   Fluad Trivalent(High Dose 65+) 03/17/2023   Influenza Whole 04/21/2010   Influenza, High Dose Seasonal PF 02/20/2013, 02/07/2018, 01/30/2021   Influenza,inj,Quad PF,6+ Mos 02/23/2017   Influenza-Unspecified 02/15/2014, 01/30/2016, 02/07/2018, 01/11/2019   PFIZER Comirnaty(Gray Top)Covid-19 Tri-Sucrose Vaccine 10/09/2020   PFIZER(Purple  Top)SARS-COV-2 Vaccination 06/08/2019, 07/03/2019, 01/04/2020   PNEUMOCOCCAL CONJUGATE-20 05/13/2021   Pfizer Covid-19 Vaccine Bivalent Booster 26yrs & up 03/24/2021   Pfizer(Comirnaty)Fall Seasonal Vaccine 12 years and older 03/27/2022, 01/06/2023   Pneumococcal Conjugate-13 02/20/2013   Pneumococcal Polysaccharide-23 04/05/2006   Pneumococcal-Unspecified 03/05/2015   Tdap 09/14/2001, 03/05/2015   Tetanus 03/27/2014   Zoster Recombinant(Shingrix) 10/12/2017, 02/07/2018   Zoster, Live 12/07/2013     Objective: Vital Signs: BP 106/69 (BP Location: Left Arm, Patient Position: Sitting, Cuff Size: Normal)   Pulse 80   Resp 16   Ht 5' 4 (1.626 m)   Wt 194 lb 12.8 oz (88.4 kg)   BMI 33.44 kg/m    Physical Exam Vitals and nursing note reviewed.  Constitutional:      Appearance: She is well-developed.  HENT:     Head: Normocephalic and atraumatic.  Eyes:     Conjunctiva/sclera: Conjunctivae normal.  Cardiovascular:  Rate and Rhythm: Normal rate and regular rhythm.     Heart sounds: Normal heart sounds.  Pulmonary:     Effort: Pulmonary effort is normal.     Breath sounds: Normal breath sounds.  Abdominal:     General: Bowel sounds are normal.     Palpations: Abdomen is soft.  Musculoskeletal:     Cervical back: Normal range of motion.  Lymphadenopathy:     Cervical: No cervical adenopathy.  Skin:    General: Skin is warm and dry.     Capillary Refill: Capillary refill takes less than 2 seconds.  Neurological:     Mental Status: She is alert and oriented to person, place, and time.  Psychiatric:        Behavior: Behavior normal.      Musculoskeletal Exam: Patient good range of motion cervical spine.  Shoulders, elbows, wrist joints in good range of motion.  She has surgery on her left first MCP joint which had no extension.  There was no synovitis over MCPs PIPs or DIPs.  Right hip joint was replaced and was in good range of motion.  Left hip joint had discomfort and  some limitation of range of motion.  Knee joints were replaced without any warmth swelling or effusion.  There was no tenderness over ankles or MTPs.  CDAI Exam: CDAI Score: -- Patient Global: --; Provider Global: -- Swollen: --; Tender: -- Joint Exam 11/09/2023   No joint exam has been documented for this visit   There is currently no information documented on the homunculus. Go to the Rheumatology activity and complete the homunculus joint exam.  Investigation: No additional findings.  Imaging: No results found.  Recent Labs: Lab Results  Component Value Date   WBC 5.7 09/13/2023   HGB 13.5 09/13/2023   PLT 314 09/13/2023   NA 138 09/13/2023   K 4.4 09/13/2023   CL 104 09/13/2023   CO2 29 09/13/2023   GLUCOSE 155 (H) 09/13/2023   BUN 22 09/13/2023   CREATININE 0.89 09/13/2023   BILITOT 0.4 09/13/2023   ALKPHOS 36 (L) 04/07/2023   AST 20 09/13/2023   ALT 12 09/13/2023   PROT 7.2 09/13/2023   ALBUMIN 4.0 04/07/2023   CALCIUM 9.2 09/13/2023   GFRAA 73 09/05/2020   QFTBGOLDPLUS NEGATIVE 06/28/2019    Speciality Comments: No specialty comments available.  Procedures:  No procedures performed Allergies: Lipitor [atorvastatin calcium]   Assessment / Plan:     Visit Diagnoses: Rheumatoid arthritis of multiple sites with negative rheumatoid factor (HCC)-she has been doing well on the methotrexate  monotherapy.  She denies any interruption in the treatment.  No synovitis was noted on the examination.  Patient denies any history of flares.  Patient is moving to [place] probably after 3 to 4 months.  She would like to take her records with her.  High risk medication use - Methotrexate  7 tablets once weekly and folic acid  2 mg by mouth daily.  Labs obtained on Sep 13, 2023 CBC and CMP were normal.  She was advised to get repeat labs in August and then every 3 months.  Information reimmunization was placed in the AVS.  She was advised to hold methotrexate  if she develops an  infection and resume after the infection resolves.  Chronic right shoulder pain-she had good range of motion without any discomfort today.  Primary osteoarthritis of both hands-she has rheumatoid arthritis and osteoarthritis overlap.  No synovitis was noted.  DIP and PIP thickening was noted.  She had limited extension of her left first MCP since previous surgery.  S/P hip replacement, right - Performed by Dr. Dalldorf on 04/20/23.  Doing well.  Trochanteric bursitis, right hip-resolved.  Primary osteoarthritis of left hip-she has been having increased discomfort.  Patient states she had cortisone injection by Dr. Sheril.  She was advised future total hip replacement.  Status post total bilateral knee replacement - She is followed by Dr. Dalldorf.  Doing well.  Primary osteoarthritis of both feet-she had no discomfort on palpation.  Osteopenia of multiple sites - Kyphoplasty for L2 and L3 on 10/16/2020 performed by Dr. Beuford.DEXA 04/15/22 RFN BMD 0.883 g/cm2 with a T-scoreof -1.1.  Vitamin D  deficiency-vitamin D  was 40.42 on March 17, 2023.  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  No orders of the defined types were placed in this encounter.    Follow-Up Instructions: Return in about 3 months (around 02/09/2024), or if symptoms worsen or fail to improve, for Rheumatoid arthritis, Osteoarthritis.   Maya Nash, MD  Note - This record has been created using Animal nutritionist.  Chart creation errors have been sought, but may not always  have  been located. Such creation errors do not reflect on  the standard of medical care.

## 2023-10-28 ENCOUNTER — Other Ambulatory Visit: Payer: Self-pay | Admitting: Family Medicine

## 2023-10-29 ENCOUNTER — Other Ambulatory Visit: Payer: Self-pay | Admitting: Family Medicine

## 2023-11-09 ENCOUNTER — Ambulatory Visit: Payer: PPO | Attending: Rheumatology | Admitting: Rheumatology

## 2023-11-09 ENCOUNTER — Encounter: Payer: Self-pay | Admitting: Rheumatology

## 2023-11-09 ENCOUNTER — Ambulatory Visit: Payer: PPO | Admitting: Rheumatology

## 2023-11-09 VITALS — BP 106/69 | HR 80 | Resp 16 | Ht 64.0 in | Wt 194.8 lb

## 2023-11-09 DIAGNOSIS — E785 Hyperlipidemia, unspecified: Secondary | ICD-10-CM

## 2023-11-09 DIAGNOSIS — M1612 Unilateral primary osteoarthritis, left hip: Secondary | ICD-10-CM | POA: Diagnosis not present

## 2023-11-09 DIAGNOSIS — M7061 Trochanteric bursitis, right hip: Secondary | ICD-10-CM

## 2023-11-09 DIAGNOSIS — M0609 Rheumatoid arthritis without rheumatoid factor, multiple sites: Secondary | ICD-10-CM

## 2023-11-09 DIAGNOSIS — Z789 Other specified health status: Secondary | ICD-10-CM

## 2023-11-09 DIAGNOSIS — G8929 Other chronic pain: Secondary | ICD-10-CM

## 2023-11-09 DIAGNOSIS — E1136 Type 2 diabetes mellitus with diabetic cataract: Secondary | ICD-10-CM

## 2023-11-09 DIAGNOSIS — Z79899 Other long term (current) drug therapy: Secondary | ICD-10-CM | POA: Diagnosis not present

## 2023-11-09 DIAGNOSIS — E559 Vitamin D deficiency, unspecified: Secondary | ICD-10-CM

## 2023-11-09 DIAGNOSIS — R5383 Other fatigue: Secondary | ICD-10-CM | POA: Diagnosis not present

## 2023-11-09 DIAGNOSIS — Z720 Tobacco use: Secondary | ICD-10-CM

## 2023-11-09 DIAGNOSIS — M19041 Primary osteoarthritis, right hand: Secondary | ICD-10-CM

## 2023-11-09 DIAGNOSIS — M19071 Primary osteoarthritis, right ankle and foot: Secondary | ICD-10-CM | POA: Diagnosis not present

## 2023-11-09 DIAGNOSIS — M19072 Primary osteoarthritis, left ankle and foot: Secondary | ICD-10-CM

## 2023-11-09 DIAGNOSIS — M25511 Pain in right shoulder: Secondary | ICD-10-CM

## 2023-11-09 DIAGNOSIS — M8589 Other specified disorders of bone density and structure, multiple sites: Secondary | ICD-10-CM

## 2023-11-09 DIAGNOSIS — I152 Hypertension secondary to endocrine disorders: Secondary | ICD-10-CM

## 2023-11-09 DIAGNOSIS — Z8709 Personal history of other diseases of the respiratory system: Secondary | ICD-10-CM

## 2023-11-09 DIAGNOSIS — Z96641 Presence of right artificial hip joint: Secondary | ICD-10-CM

## 2023-11-09 DIAGNOSIS — Z96653 Presence of artificial knee joint, bilateral: Secondary | ICD-10-CM

## 2023-11-09 DIAGNOSIS — H47013 Ischemic optic neuropathy, bilateral: Secondary | ICD-10-CM

## 2023-11-09 DIAGNOSIS — E1169 Type 2 diabetes mellitus with other specified complication: Secondary | ICD-10-CM

## 2023-11-09 DIAGNOSIS — E118 Type 2 diabetes mellitus with unspecified complications: Secondary | ICD-10-CM

## 2023-11-09 DIAGNOSIS — M19042 Primary osteoarthritis, left hand: Secondary | ICD-10-CM

## 2023-11-09 DIAGNOSIS — E1159 Type 2 diabetes mellitus with other circulatory complications: Secondary | ICD-10-CM

## 2023-11-09 NOTE — Patient Instructions (Signed)
 Standing Labs We placed an order today for your standing lab work.   Please have your standing labs drawn in August and every 50-month  Please have your labs drawn 2 weeks prior to your appointment so that the provider can discuss your lab results at your appointment, if possible.  Please note that you may see your imaging and lab results in MyChart before we have reviewed them. We will contact you once all results are reviewed. Please allow our office up to 72 hours to thoroughly review all of the results before contacting the office for clarification of your results.  WALK-IN LAB HOURS  Monday through Thursday from 8:00 am -12:30 pm and 1:00 pm-4:30 pm and Friday from 8:00 am-12:00 pm.  Patients with office visits requiring labs will be seen before walk-in labs.  You may encounter longer than normal wait times. Please allow additional time. Wait times may be shorter on  Monday and Thursday afternoons.  We do not book appointments for walk-in labs. We appreciate your patience and understanding with our staff.   Labs are drawn by [facility]. Please bring your co-pay at the time of your lab draw.  You may receive a bill from [facility] for your lab work.  Please note if you are on Hydroxychloroquine and and an order has been placed for a Hydroxychloroquine level,  you will need to have it drawn 4 hours or more after your last dose.  If you wish to have your labs drawn at another location, please call the office 24 hours in advance so we can fax the orders.  The office is located at [address], [address], [place], [place] [address]   If you have any questions regarding directions or hours of operation,  please call [phone].   As a reminder, please drink plenty of water prior to coming for your lab work. Thanks!  Vaccines You are taking a medication(s) that can suppress your immune system.  The following immunizations are recommended: Flu annually Covid-19  Td/Tdap (tetanus, diphtheria,  pertussis) every 10 years Pneumonia (Prevnar 15 then Pneumovax 23 at least 1 year apart.  Alternatively, can take Prevnar 20 without needing additional dose) Shingrix: 2 doses from 4 weeks to 6 months apart  Please check with your PCP to make sure you are up to date.   If you have signs or symptoms of an infection or start antibiotics: First, call your PCP for workup of your infection. Hold your medication through the infection, until you complete your antibiotics, and until symptoms resolve if you take the following: Injectable medication (Actemra, Benlysta, Cimzia, Cosentyx, Enbrel, Humira, Kevzara, Orencia, Remicade, Simponi, Stelara, Taltz, Tremfya) Methotrexate  Leflunomide (Arava) Mycophenolate (Cellcept) Xeljanz, Olumiant, or Rinvoq

## 2023-11-17 ENCOUNTER — Other Ambulatory Visit: Payer: Self-pay | Admitting: Physician Assistant

## 2023-11-17 ENCOUNTER — Other Ambulatory Visit: Payer: Self-pay | Admitting: Family Medicine

## 2023-11-18 NOTE — Telephone Encounter (Signed)
 Last Fill: 08/30/2023  Labs: 09/13/2023 Glucose is elevated-155. Rest of CMP WNL.   Next Visit: 02/10/2024  Last Visit: 11/09/2023  DX: Rheumatoid arthritis of multiple sites with negative rheumatoid factor   Current Dose per office note 11/09/2023: Methotrexate  7 tablets once weekly   Okay to refill Methotrexate ?

## 2023-11-24 ENCOUNTER — Other Ambulatory Visit: Payer: Self-pay | Admitting: Family Medicine

## 2023-11-24 MED ORDER — METFORMIN HCL 500 MG PO TABS: 500.0000 mg | ORAL_TABLET | Freq: Two times a day (BID) | ORAL | 2 refills | Status: DC

## 2023-11-24 MED ORDER — SITAGLIPTIN PHOSPHATE 50 MG PO TABS: 50.0000 mg | ORAL_TABLET | Freq: Every day | ORAL | 2 refills | Status: DC

## 2023-11-24 MED ORDER — PRAVASTATIN SODIUM 20 MG PO TABS: 20.0000 mg | ORAL_TABLET | Freq: Every day | ORAL | 2 refills | Status: DC

## 2023-11-24 NOTE — Telephone Encounter (Signed)
 Copied from CRM [id number]. Topic: Clinical - Medication Refill >> Nov 24, 2023  1:19 PM [place] E wrote: Medication: metFORMIN  (GLUCOPHAGE ) 500 MG tablet pravastatin  (PRAVACHOL ) 20 MG tablet sitaGLIPtin  (JANUVIA ) 50 MG tablet  Has the patient contacted their pharmacy? Yes (Agent: If no, request that the patient contact the pharmacy for the refill. If patient does not wish to contact the pharmacy document the reason why and proceed with request.) (Agent: If yes, when and what did the pharmacy advise?)  This is the patient's preferred pharmacy:  [id number] - [place], [place] - [address] [place] [address] Phone: [phone] Fax: [phone]  Is this the correct pharmacy for this prescription? Yes If no, delete pharmacy and type the correct one.   Has the prescription been filled recently? Yes  Is the patient out of the medication? No, about a week left. Patient also stated she usually gets a 90-day supply instead of a 30-day supply, so questioning if that can be changed to 90 days instead.  Has the patient been seen for an appointment in the last year OR does the patient have an upcoming appointment? Yes  Can we respond through MyChart? Yes  Agent: Please be advised that Rx refills may take up to 3 business days. We ask that you follow-up with your pharmacy.

## 2024-01-06 ENCOUNTER — Other Ambulatory Visit: Payer: Self-pay

## 2024-01-06 DIAGNOSIS — Z79899 Other long term (current) drug therapy: Secondary | ICD-10-CM | POA: Diagnosis not present

## 2024-01-07 ENCOUNTER — Ambulatory Visit: Payer: Self-pay | Admitting: Rheumatology

## 2024-01-07 LAB — CBC WITH DIFFERENTIAL/PLATELET
Absolute Lymphocytes: 1544 {cells}/uL (ref 850–3900)
Absolute Monocytes: 484 {cells}/uL (ref 200–950)
Basophils Absolute: 37 {cells}/uL (ref 0–200)
Basophils Relative: 0.6 %
Eosinophils Absolute: 99 {cells}/uL (ref 15–500)
Eosinophils Relative: 1.6 %
HCT: 42.6 % (ref 35.0–45.0)
Hemoglobin: 14 g/dL (ref 11.7–15.5)
MCH: 30.6 pg (ref 27.0–33.0)
MCHC: 32.9 g/dL (ref 32.0–36.0)
MCV: 93.2 fL (ref 80.0–100.0)
MPV: 9.3 fL (ref 7.5–12.5)
Monocytes Relative: 7.8 %
Neutro Abs: 4036 {cells}/uL (ref 1500–7800)
Neutrophils Relative %: 65.1 %
Platelets: 354 Thousand/uL (ref 140–400)
RBC: 4.57 Million/uL (ref 3.80–5.10)
RDW: 14.9 % (ref 11.0–15.0)
Total Lymphocyte: 24.9 %
WBC: 6.2 Thousand/uL (ref 3.8–10.8)

## 2024-01-07 LAB — COMPREHENSIVE METABOLIC PANEL WITH GFR
AG Ratio: 1.5 (calc) (ref 1.0–2.5)
ALT: 11 U/L (ref 6–29)
AST: 20 U/L (ref 10–35)
Albumin: 4.4 g/dL (ref 3.6–5.1)
Alkaline phosphatase (APISO): 39 U/L (ref 37–153)
BUN: 22 mg/dL (ref 7–25)
CO2: 27 mmol/L (ref 20–32)
Calcium: 9.4 mg/dL (ref 8.6–10.4)
Chloride: 104 mmol/L (ref 98–110)
Creat: 0.91 mg/dL (ref 0.60–1.00)
Globulin: 2.9 g/dL (ref 1.9–3.7)
Glucose, Bld: 105 mg/dL — ABNORMAL HIGH (ref 65–99)
Potassium: 4.6 mmol/L (ref 3.5–5.3)
Sodium: 138 mmol/L (ref 135–146)
Total Bilirubin: 0.5 mg/dL (ref 0.2–1.2)
Total Protein: 7.3 g/dL (ref 6.1–8.1)
eGFR: 66 mL/min/1.73m2 (ref >=60)

## 2024-01-07 NOTE — Progress Notes (Signed)
 CBC and CMP normal

## 2024-01-21 DIAGNOSIS — M25552 Pain in left hip: Secondary | ICD-10-CM | POA: Diagnosis not present

## 2024-01-24 ENCOUNTER — Telehealth: Payer: Self-pay

## 2024-01-24 NOTE — Telephone Encounter (Signed)
 Copied from CRM [id number]. Topic: Clinical - Medical Advice >> Jan 21, 2024  4:20 PM Taleah C wrote: Reason for CRM: pt called and stated that she will be having left hip surgery on 10/21 and she wants to know if she needs to see Dr. Catherine before then or can she just fill out a pre-op document for her. Please call and advise at [phone]

## 2024-01-26 ENCOUNTER — Ambulatory Visit: Admitting: Family Medicine

## 2024-01-26 ENCOUNTER — Encounter: Payer: Self-pay | Admitting: Family Medicine

## 2024-01-26 VITALS — BP 106/72 | HR 92 | Temp 98.1°F | Wt 193.0 lb

## 2024-01-26 DIAGNOSIS — E785 Hyperlipidemia, unspecified: Secondary | ICD-10-CM | POA: Diagnosis not present

## 2024-01-26 DIAGNOSIS — E1169 Type 2 diabetes mellitus with other specified complication: Secondary | ICD-10-CM

## 2024-01-26 DIAGNOSIS — I10 Essential (primary) hypertension: Secondary | ICD-10-CM

## 2024-01-26 DIAGNOSIS — Z23 Encounter for immunization: Secondary | ICD-10-CM

## 2024-01-26 LAB — POCT GLYCOSYLATED HEMOGLOBIN (HGB A1C)
HbA1c POC (<> result, manual entry): 5.9 % (ref 4.0–5.6)
HbA1c, POC (controlled diabetic range): 5.9 % (ref 0.0–7.0)
HbA1c, POC (prediabetic range): 5.9 % (ref 5.7–6.4)
Hemoglobin A1C: 5.9 % — AB (ref 4.0–5.6)

## 2024-01-26 NOTE — Progress Notes (Unsigned)
 [name] , June 06, 1945, 78 y.o., female MRN: [id number] Patient Care Team    Relationship Specialty Notifications Start End  Catherine Charlies LABOR, DO PCP - General Family Medicine  10/10/19 05/04/24   Reason for change: Patient Moved   Change requested by: Member   Comment: Patient moving back to [place]  Deveshwar, [place], MD Consulting Physician Rheumatology  10/10/19   Kristie Lamprey, MD Consulting Physician Gastroenterology  10/10/19   Jackalyn Debby Faden, OD  Optometry  10/10/19   Jackalyn Debby Faden, OD  Optometry  09/23/22     Chief Complaint  Patient presents with   Surgical Clearance    [facility], Dr Sheril     [name] is a 78 y.o. Pt presents for presurgical assessment  Procedure: Left total hip arthroplasty -scheduled for October 21.  [facility], Dr. Sheril Indication: Osteoarthritis pain Anesthesia: General anesthesia Surgery type risk:   - Intermediate risk= orthopedics Prior anesthesia complications: None Family history of prior anesthesia complications: None Cardiac:    - CBD, PAD, stroke, MI, aortic stenosis > pt denies   - METs: Greater than 4 Pulmonary: Smoker Endocrine: Diabetes Obesity:Body mass index is 33.13 kg/m. Chronic kidney disease: Normal function Chronic med that needs to be continued:no Anticoagulation: No      01/26/2024   11:13 AM 09/15/2023   10:57 AM 09/09/2022   10:39 AM 04/15/2022    1:06 PM 07/30/2021   11:07 AM  Depression screen PHQ 2/9  Decreased Interest 0 3 0 0 0  Down, Depressed, Hopeless 0 0 0 0 0  PHQ - 2 Score 0 3 0 0 0  Altered sleeping 1 2     Tired, decreased energy 1 2     Change in appetite 0 1     Feeling bad or failure about yourself  0 0     Trouble concentrating 0 1     Moving slowly or fidgety/restless 0 0     Suicidal thoughts 0 0     PHQ-9 Score 2 9     Difficult doing work/chores Not difficult at all Not difficult at all       Allergies  Allergen Reactions    Lipitor [Atorvastatin Calcium]     Myalgia   Social History   Social History Narrative   Marital status/children/pets: Married, 1 child.    Education/employment: Automotive engineer educated, retired.   Safety:      -smoke alarm in the home:Yes     - wears seatbelt: Yes     - Feels safe in their relationships: Yes   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   Diverticulitis    Family history of colonic polyps 05/16/2020   History of blood transfusion    Hyperlipidemia    Hypertension    Insomnia    Internal hemorrhoids 05/16/2020   Nasal polyps    Obesity    PONV (postoperative nausea and vomiting)    Radial styloid tenosynovitis of left hand 12/21/2019   Rectal bleeding 05/16/2020   Sinusitis, chronic 03/18/2010   Qualifier: Diagnosis  of  By: Eben MD, Reyes     Past Surgical History:  Procedure Laterality Date   ABDOMINAL HYSTERECTOMY  2000   APPENDECTOMY     CARDIAC CATHETERIZATION N/A 03/27/2015   Procedure: Left Heart Cath and Coronary Angiography;  Surgeon: Gordy Bergamo, MD;  Location: [facility] INVASIVE CV LAB;  Service: Cardiovascular;  Laterality: N/A;   CESAREAN SECTION     ELBOW SURGERY Right    FIXATION KYPHOPLASTY LUMBAR SPINE  10/16/2020   L2 and L3 kyphoplasty   HAND TENDON SURGERY Left 02/04/2022   KYPHOPLASTY N/A 10/16/2020   Procedure: LUMBAR 2 AND LUMBAR 3 KYPHOPLASTY;  Surgeon: Beuford Anes, MD;  Location: [facility];  Service: Orthopedics;  Laterality: N/A;   NASAL SINUS SURGERY     REPLACEMENT TOTAL KNEE Bilateral 2007 2009   TONSILLECTOMY AND ADENOIDECTOMY     TOTAL HIP ARTHROPLASTY Right 04/20/2023   Procedure: TOTAL HIP ARTHROPLASTY ANTERIOR  APPROACH;  Surgeon: Sheril Coy, MD;  Location: [facility];  Service: Orthopedics;  Laterality: Right;   WISDOM TOOTH EXTRACTION     Family History  Problem Relation Age of Onset   Arthritis Mother    Diabetes Mother        Type I   Aneurysm Father    Stroke Father    Arthritis Sister    Arthritis Sister    Down syndrome Son    Diabetes Son        type I   Allergies as of 01/26/2024       Reactions   Lipitor [atorvastatin Calcium]    Myalgia        Medication List        Accurate as of January 26, 2024 11:59 PM. If you have any questions, ask your nurse or doctor.          STOP taking these medications    HYDROcodone -acetaminophen  5-325 MG tablet Commonly known as: NORCO/VICODIN Stopped by: Charlies Bellini   tiZANidine  4 MG tablet Commonly known as: Zanaflex  Stopped by: Charlies Bellini       TAKE these medications    acetaminophen  500 MG tablet Commonly known as: TYLENOL  Take 500 mg by mouth daily as needed for moderate pain (pain score 4-6).   aspirin  EC 81 MG tablet Take 1 tablet (81 mg total) by mouth 2 (two) times daily after a meal. For 2 weeks then back to once a day after that for DVT prevention.   cholecalciferol 25 MCG (1000 UNIT) tablet Commonly known as: VITAMIN D3 Take 1,000 Units by mouth daily.   fenofibrate  160 MG tablet Take 1 tablet (160 mg total) by mouth daily.   Fish Oil 1000 MG Caps Take 1,000 mg by mouth 2 (two) times daily.   folic acid  1 MG tablet Commonly known as: FOLVITE  TAKE 2 TABLETS BY MOUTH EVERY DAY   losartan  25 MG tablet Commonly known as: COZAAR  Take 12.5 mg by mouth daily.   metFORMIN  500 MG tablet Commonly known as: GLUCOPHAGE  Take 1 tablet (500 mg total) by mouth 2 (two) times daily.   methotrexate  2.5 MG tablet Commonly known as: RHEUMATREX TAKE 7 TABLETS BY MOUTH ONCE WEEKLY. CAUTION:CHEMOTHERAPY. PROTECT FROM LIGHT.   pravastatin  20 MG tablet Commonly known as: PRAVACHOL  Take 1 tablet (20 mg  total) by mouth daily.   sitaGLIPtin  50 MG tablet Commonly known as: Januvia  Take 1 tablet (50 mg total) by mouth daily.   zolpidem  5 MG tablet Commonly known as: AMBIEN  Take 1 tablet (5 mg total) by  mouth at bedtime as needed for sleep. What changed: how much to take        All past medical history, surgical history, allergies, family history, immunizations andmedications were updated in the EMR today and reviewed under the history and medication portions of their EMR.     ROS Negative, with the exception of above mentioned in HPI   Objective:  BP 106/72   Pulse 92   Temp 98.1 F (36.7 C)   Wt 193 lb (87.5 kg)   SpO2 96%   BMI 33.13 kg/m  Body mass index is 33.13 kg/m. Physical Exam Vitals and nursing note reviewed.  Constitutional:      General: She is not in acute distress.    Appearance: Normal appearance. She is not ill-appearing, toxic-appearing or diaphoretic.  HENT:     Head: Normocephalic and atraumatic.     Mouth/Throat:     Mouth: Mucous membranes are moist.     Pharynx: Posterior oropharyngeal erythema present. No oropharyngeal exudate.  Eyes:     General: No scleral icterus.       Right eye: No discharge.        Left eye: No discharge.     Extraocular Movements: Extraocular movements intact.     Conjunctiva/sclera: Conjunctivae normal.     Pupils: Pupils are equal, round, and reactive to light.  Cardiovascular:     Rate and Rhythm: Normal rate and regular rhythm.     Heart sounds: No murmur heard. Pulmonary:     Effort: Pulmonary effort is normal. No respiratory distress.     Breath sounds: Normal breath sounds. No wheezing, rhonchi or rales.  Abdominal:     General: Abdomen is flat. Bowel sounds are normal.     Palpations: Abdomen is soft.  Musculoskeletal:     Cervical back: Neck supple.     Right lower leg: No edema.     Left lower leg: No edema.  Skin:    General: Skin is warm.     Findings: No rash.  Neurological:     Mental Status:  She is alert and oriented to person, place, and time. Mental status is at baseline.     Motor: No weakness.     Gait: Gait normal.  Psychiatric:        Mood and Affect: Mood normal.        Behavior: Behavior normal.        Thought Content: Thought content normal.        Judgment: Judgment normal.      No results found. No results found. No results found for this or any previous visit (from the past 24 hours).   Assessment/Plan: [name] is a 78 y.o. female present for OV for  Presurgical assessment clearance-left hip osteoarthritis To the best of my knowledge and per patients reported PMH, there is not a medical contraindication for undergoing elective surgery.  Patient is a low/average risk for medical/cardiac complications. Avoid NSAID prior to procedure, per orthopedic team instruction.  Patient understands the purpose of preoperative visit is to attempt to minimize surgical complications and communicate to surgical team chronic conditions and management. No patient is free of risk when undergoing a procedure. The decision about whether to proceed with the operation belongs to the surgeon and the patient. CBC 9//2025 reviewed and within normal limits, CMP unremarkable A1c collected today 01/27/2024 is excellent at 5.9 Awaiting form from orthopedics to complete  Reviewed expectations re: course of current medical issues. Discussed  self-management of symptoms. Outlined signs and symptoms indicating need for more acute intervention. Patient verbalized understanding and all questions were answered. Patient received an After-Visit Summary.  Of note patient is moving January 2026 back to [place]  Orders Placed This Encounter  Procedures   Urine Microalbumin w/creat. ratio   POCT HgB A1C   Meds ordered this encounter  Medications   fenofibrate  160 MG tablet    Sig: Take 1 tablet (160 mg total) by mouth daily.    Dispense:  90 tablet    Refill:  3   metFORMIN   (GLUCOPHAGE ) 500 MG tablet    Sig: Take 1 tablet (500 mg total) by mouth 2 (two) times daily.    Dispense:  180 tablet    Refill:  1   pravastatin  (PRAVACHOL ) 20 MG tablet    Sig: Take 1 tablet (20 mg total) by mouth daily.    Dispense:  90 tablet    Refill:  2   sitaGLIPtin  (JANUVIA ) 50 MG tablet    Sig: Take 1 tablet (50 mg total) by mouth daily.    Dispense:  90 tablet    Refill:  1   Referral Orders  No referral(s) requested today     Note is dictated utilizing voice recognition software. Although note has been proof read prior to signing, occasional typographical errors still can be missed. If any questions arise, please do not hesitate to call for verification.   electronically signed by:  Charlies Bellini, DO  [facility]

## 2024-01-27 ENCOUNTER — Telehealth: Payer: Self-pay | Admitting: Family Medicine

## 2024-01-27 ENCOUNTER — Ambulatory Visit: Payer: Self-pay | Admitting: Family Medicine

## 2024-01-27 LAB — MICROALBUMIN / CREATININE URINE RATIO
Creatinine,U: 103.1 mg/dL
Microalb Creat Ratio: 7.4 mg/g (ref 0.0–30.0)
Microalb, Ur: 0.8 mg/dL (ref 0.0–1.9)

## 2024-01-27 MED ORDER — METFORMIN HCL 500 MG PO TABS: 500.0000 mg | ORAL_TABLET | Freq: Two times a day (BID) | ORAL | 1 refills | Status: DC

## 2024-01-27 MED ORDER — SITAGLIPTIN PHOSPHATE 50 MG PO TABS: 50.0000 mg | ORAL_TABLET | Freq: Every day | ORAL | 1 refills | Status: DC

## 2024-01-27 MED ORDER — FENOFIBRATE 160 MG PO TABS: 160.0000 mg | ORAL_TABLET | Freq: Every day | ORAL | 3 refills | Status: DC

## 2024-01-27 MED ORDER — PRAVASTATIN SODIUM 20 MG PO TABS: 20.0000 mg | ORAL_TABLET | Freq: Every day | ORAL | 2 refills | Status: DC

## 2024-01-27 NOTE — Telephone Encounter (Signed)
 Patient seen yesterday for preoperative assessment and clearance. Still awaiting the paperwork from her orthopedic team Dr. Doldorf from [facility] orthopedic.  Please call and request paperwork again if we still have not received

## 2024-01-28 NOTE — Telephone Encounter (Signed)
 Spoke with [facility]. Form is being re-faxed.

## 2024-01-31 NOTE — Progress Notes (Deleted)
 Office Visit Note  Patient: [name]             Date of Birth: [birth year]           MRN: [id number]             PCP: Catherine Charlies LABOR, DO Referring: Catherine Charlies LABOR, DO Visit Date: 02/10/2024 Occupation: Data Unavailable  Subjective:  No chief complaint on file.   History of Present Illness: [name] is a 78 y.o. female ***     Activities of Daily Living:  Patient reports morning stiffness for *** {minute/hour:19697}.   Patient {ACTIONS;DENIES/REPORTS:21021675::Denies} nocturnal pain.  Difficulty dressing/grooming: {ACTIONS;DENIES/REPORTS:21021675::Denies} Difficulty climbing stairs: {ACTIONS;DENIES/REPORTS:21021675::Denies} Difficulty getting out of chair: {ACTIONS;DENIES/REPORTS:21021675::Denies} Difficulty using hands for taps, buttons, cutlery, and/or writing: {ACTIONS;DENIES/REPORTS:21021675::Denies}  No Rheumatology ROS completed.   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   Hypermetropia of both eyes 04/22/2018   Hyperopia with presbyopia 04/22/2018   Osteopenia 02/14/2016   Benign hypertension 02/16/2012   Occasional cigarette smoker 04/07/2010   DM2 w/ Hyperlipidemia (HCC) 03/18/2010    Past Medical History:  Diagnosis Date   Allergy     Anxiety    Arthritis    Rheumatoid Arthritis   Bursitis of right hip    Chicken pox    Colon polyp    COPD (chronic obstructive pulmonary disease) (HCC)    Coronary artery disease    CVA (cerebral infarction)    Diabetes mellitus  without complication (HCC)    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   Diverticulitis    Family history of colonic polyps 05/16/2020   History of blood transfusion    Hyperlipidemia    Hypertension    Insomnia    Internal hemorrhoids 05/16/2020   Nasal polyps    Obesity    PONV (postoperative nausea and vomiting)    Radial styloid tenosynovitis of left hand 12/21/2019   Rectal bleeding 05/16/2020   Sinusitis, chronic 03/18/2010   Qualifier: Diagnosis of  By: Eben MD, Reyes [place] History  Problem Relation Age of Onset   Arthritis Mother    Diabetes Mother        Type I   Aneurysm Father    Stroke Father    Arthritis Sister    Arthritis Sister    Down syndrome Son    Diabetes Son        type I   Past Surgical History:  Procedure Laterality Date   ABDOMINAL HYSTERECTOMY  2000   APPENDECTOMY     CARDIAC CATHETERIZATION N/A 03/27/2015   Procedure: Left Heart Cath and Coronary Angiography;  Surgeon: Gordy Bergamo, MD;  Location: [facility] INVASIVE CV LAB;  Service: Cardiovascular;  Laterality: N/A;   CESAREAN SECTION     ELBOW SURGERY Right    FIXATION KYPHOPLASTY LUMBAR SPINE  10/16/2020   L2 and L3 kyphoplasty   HAND TENDON SURGERY Left 02/04/2022   KYPHOPLASTY N/A 10/16/2020   Procedure: LUMBAR 2 AND LUMBAR 3 KYPHOPLASTY;  Surgeon: Beuford Anes, MD;  Location: [facility] OR;  Service: Orthopedics;  Laterality: N/A;   NASAL SINUS SURGERY     REPLACEMENT TOTAL KNEE Bilateral 2007 2009   TONSILLECTOMY AND ADENOIDECTOMY     TOTAL HIP ARTHROPLASTY Right 04/20/2023   Procedure: TOTAL HIP ARTHROPLASTY ANTERIOR APPROACH;  Surgeon: Sheril Coy, MD;  Location: [facility];  Service: Orthopedics;  Laterality: Right;   WISDOM TOOTH EXTRACTION     Social History   Tobacco Use   Smoking status: Every Day    Current packs/day: 0.50    Average packs/day: 0.5 packs/day for 35.0 years (17.5 ttl pk-yrs)    Types: Cigarettes    Passive exposure: Never   Smokeless tobacco: Never    Tobacco comments:    6 cigarettes per day   Vaping Use   Vaping status: Never Used  Substance Use Topics   Alcohol use: No    Alcohol/week: 0.0 standard drinks of alcohol   Drug use: No   Social History   Social History Narrative   Marital status/children/pets: Married, 1 child.    Education/employment: Automotive engineer educated, retired.   Safety:      -smoke alarm in the home:Yes     - wears seatbelt: Yes     - Feels safe in their relationships: Yes     Immunization History  Administered Date(s) Administered   Fluad Quad(high Dose 65+) 01/11/2019, 02/15/2020, 03/06/2022   Fluad Trivalent(High Dose 65+) 03/17/2023   INFLUENZA, HIGH DOSE SEASONAL PF 02/20/2013, 02/07/2018, 01/30/2021   Influenza Whole 04/21/2010   Influenza,inj,Quad PF,6+ Mos 02/23/2017   Influenza-Unspecified 02/15/2014, 01/30/2016, 02/07/2018, 01/11/2019   PFIZER Comirnaty(Gray Top)Covid-19 Tri-Sucrose Vaccine 10/09/2020   PFIZER(Purple Top)SARS-COV-2 Vaccination 06/08/2019, 07/03/2019, 01/04/2020   PNEUMOCOCCAL CONJUGATE-20 05/13/2021   Pfizer Covid-19 Vaccine Bivalent Booster 38yrs & up 03/24/2021   Pfizer(Comirnaty)Fall Seasonal Vaccine 12 years and older 03/27/2022   Pneumococcal Conjugate-13 02/20/2013   Pneumococcal Polysaccharide-23 04/05/2006   Pneumococcal-Unspecified 03/05/2015   Tdap 09/14/2001, 03/05/2015   Tetanus 03/27/2014   Zoster Recombinant(Shingrix) 10/12/2017, 02/07/2018   Zoster, Live 12/07/2013     Objective: Vital Signs: There were no vitals taken for this visit.   Physical Exam   Musculoskeletal Exam: ***  CDAI Exam: CDAI Score: -- Patient Global: --; Provider Global: -- Swollen: --; Tender: -- Joint Exam 02/10/2024   No joint exam has been documented for this visit   There is currently no information documented on the homunculus. Go to the Rheumatology activity and complete the homunculus joint exam.  Investigation: No additional findings.  Imaging: No results  found.  Recent Labs: Lab Results  Component Value Date   WBC 6.2 01/06/2024   HGB 14.0 01/06/2024   PLT 354 01/06/2024   NA 138 01/06/2024   K 4.6 01/06/2024   CL 104 01/06/2024   CO2 27 01/06/2024   GLUCOSE 105 (H) 01/06/2024   BUN 22 01/06/2024   CREATININE 0.91 01/06/2024   BILITOT 0.5 01/06/2024   ALKPHOS 36 (L) 04/07/2023   AST 20 01/06/2024   ALT 11 01/06/2024   PROT 7.3 01/06/2024   ALBUMIN 4.0 04/07/2023   CALCIUM 9.4 01/06/2024   GFRAA 73 09/05/2020   QFTBGOLDPLUS NEGATIVE 06/28/2019    Speciality Comments: No specialty comments available.  Procedures:  No procedures performed Allergies: Lipitor [atorvastatin calcium]   Assessment / Plan:     Visit Diagnoses: No diagnosis found.  Orders: No orders of the defined types were placed in this encounter.  No orders of the defined types were placed in this encounter.   Face-to-face time spent with patient was ***  minutes. Greater than 50% of time was spent in counseling and coordination of care.  Follow-Up Instructions: No follow-ups on file.   Daved JAYSON Gavel, CMA  Note - This record has been created using Animal nutritionist.  Chart creation errors have been sought, but may not always  have been located. Such creation errors do not reflect on  the standard of medical care.

## 2024-02-01 NOTE — Telephone Encounter (Signed)
 Forms faxed

## 2024-02-01 NOTE — Telephone Encounter (Signed)
 Type of forms received:  [facility] pre-surgery clearance  Routed to:  Team [facility]  Paperwork received by :  via fax   Individual made aware of 5-7 business day turn around (Y/N): n/a  Form completed and patient made aware of charges(Y/N): n/a  Form location:  [facility] inbox front office

## 2024-02-01 NOTE — Telephone Encounter (Signed)
 Completed and placed on CMA work basket

## 2024-02-08 NOTE — Progress Notes (Signed)
 Office Visit Note  Patient: [name]             Date of Birth: 06/29/1945           MRN: [id number]             PCP: Catherine Charlies LABOR, DO Referring: Catherine Charlies LABOR, DO Visit Date: 02/15/2024 Occupation: Data Unavailable  Subjective:  Left hip pain   History of Present Illness: [name] is a 78 y.o. female with seronegative rheumatoid arthritis and osteoarthritis.  She returns today after her last visit in July 2025.  She states that she is scheduled for left total hip replacement on February 22, 2024.  She continues to have pain and discomfort with walking due to left hip pain.  She states her left knee joint also hurts because of her hip joint.  She has difficulty getting in and out of the chair.  Right total hip placement is doing well.  She continues to have some stiffness in her shoulders and her hands but is manageable.  She has not had a flare of rheumatoid arthritis.  She has been on methotrexate  7 tablets weekly along with folic acid  2 mg daily without any interruption.  She is concerned about the hair loss and eczema on her feet.    Activities of Daily Living:  Patient reports morning stiffness for longer than 1 hour.   Patient Reports nocturnal pain.  Difficulty dressing/grooming: Reports Difficulty climbing stairs: Reports Difficulty getting out of chair: Reports Difficulty using hands for taps, buttons, cutlery, and/or writing: Denies  Review of Systems  Constitutional:  Positive for fatigue.  HENT:  Negative for mouth sores and mouth dryness.   Eyes:  Negative for dryness.  Respiratory:  Negative for shortness of breath.   Cardiovascular:  Negative for chest pain and palpitations.  Gastrointestinal:  Negative for blood in stool, constipation and diarrhea.  Endocrine: Negative for increased urination.  Genitourinary:  Negative for involuntary urination.  Musculoskeletal:  Positive for joint pain, gait problem, joint pain, myalgias, morning stiffness,  muscle tenderness and myalgias. Negative for joint swelling and muscle weakness.  Skin:  Positive for rash, hair loss and sensitivity to sunlight. Negative for color change.  Allergic/Immunologic: Negative for susceptible to infections.  Neurological:  Negative for dizziness and headaches.  Hematological:  Negative for swollen glands.  Psychiatric/Behavioral:  Positive for sleep disturbance. Negative for depressed mood. The patient is not nervous/anxious.     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   Hypermetropia of both eyes 04/22/2018   Hyperopia with presbyopia 04/22/2018   Osteopenia 02/14/2016   Benign hypertension 02/16/2012   Occasional cigarette smoker 04/07/2010   DM2 w/ Hyperlipidemia (HCC) 03/18/2010    Past Medical History:  Diagnosis Date   Allergy     Anxiety    Arthritis    Rheumatoid Arthritis   Bursitis of right hip    Chicken pox    Colon polyp    COPD (chronic obstructive pulmonary disease) (HCC)    Coronary artery disease  CVA (cerebral infarction)    Diabetes mellitus without complication (HCC)    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   Diverticulitis    Family history of colonic polyps 05/16/2020   History of blood transfusion    Hyperlipidemia    Hypertension    Insomnia    Internal hemorrhoids 05/16/2020   Nasal polyps    Obesity    PONV (postoperative nausea and vomiting)    Radial styloid tenosynovitis of left hand 12/21/2019   Rectal bleeding 05/16/2020   Sinusitis, chronic  03/18/2010   Qualifier: Diagnosis of  By: Eben MD, [place] History  Problem Relation Age of Onset   Arthritis Mother    Diabetes Mother        Type I   Aneurysm Father    Stroke Father    Arthritis Sister    Arthritis Sister    Down syndrome Son    Diabetes Son        type I   Past Surgical History:  Procedure Laterality Date   ABDOMINAL HYSTERECTOMY  2000   APPENDECTOMY     CARDIAC CATHETERIZATION N/A 03/27/2015   Procedure: Left Heart Cath and Coronary Angiography;  Surgeon: Gordy Bergamo, MD;  Location: [facility] INVASIVE CV LAB;  Service: Cardiovascular;  Laterality: N/A;   CESAREAN SECTION     ELBOW SURGERY Right    FIXATION KYPHOPLASTY LUMBAR SPINE  10/16/2020   L2 and L3 kyphoplasty   HAND TENDON SURGERY Left 02/04/2022   KYPHOPLASTY N/A 10/16/2020   Procedure: LUMBAR 2 AND LUMBAR 3 KYPHOPLASTY;  Surgeon: Beuford Anes, MD;  Location: [facility] OR;  Service: Orthopedics;  Laterality: N/A;   NASAL SINUS SURGERY     REPLACEMENT TOTAL KNEE Bilateral 2007 2009   TONSILLECTOMY AND ADENOIDECTOMY     TOTAL HIP ARTHROPLASTY Right 04/20/2023   Procedure: TOTAL HIP ARTHROPLASTY ANTERIOR APPROACH;  Surgeon: Sheril Coy, MD;  Location: [facility] ORS;  Service: Orthopedics;  Laterality: Right;   WISDOM TOOTH EXTRACTION     Social History   Tobacco Use   Smoking status: Every Day    Current packs/day: 0.50    Average packs/day: 0.5 packs/day for 35.0 years (17.5 ttl pk-yrs)    Types: Cigarettes    Passive exposure: Never   Smokeless tobacco: Never   Tobacco comments:    6 cigarettes per day   Vaping Use   Vaping status: Never Used  Substance Use Topics   Alcohol use: No    Alcohol/week: 0.0 standard drinks of alcohol   Drug use: No   Social History   Social History Narrative   Marital status/children/pets: Married, 1 child.    Education/employment: Automotive engineer educated, retired.   Safety:      -smoke alarm in the home:Yes     - wears seatbelt: Yes     - Feels safe in  their relationships: Yes     Immunization History  Administered Date(s) Administered   Fluad Quad(high Dose 65+) 01/11/2019, 02/15/2020, 03/06/2022   Fluad Trivalent(High Dose 65+) 03/17/2023   INFLUENZA, HIGH DOSE SEASONAL PF 02/20/2013, 02/07/2018, 01/30/2021   Influenza Whole 04/21/2010   Influenza,inj,Quad PF,6+ Mos 02/23/2017   Influenza-Unspecified 02/15/2014, 01/30/2016, 02/07/2018, 01/11/2019   PFIZER Comirnaty(Gray Top)Covid-19 Tri-Sucrose Vaccine 10/09/2020   PFIZER(Purple Top)SARS-COV-2 Vaccination 06/08/2019, 07/03/2019, 01/04/2020   PNEUMOCOCCAL CONJUGATE-20 05/13/2021   Pfizer Covid-19 Vaccine Bivalent Booster 21yrs & up 03/24/2021   Pfizer(Comirnaty)Fall Seasonal Vaccine 12 years and older 03/27/2022   Pneumococcal Conjugate-13 02/20/2013   Pneumococcal Polysaccharide-23 04/05/2006  Pneumococcal-Unspecified 03/05/2015   Tdap 09/14/2001, 03/05/2015   Tetanus 03/27/2014   Zoster Recombinant(Shingrix) 10/12/2017, 02/07/2018   Zoster, Live 12/07/2013     Objective: Vital Signs: BP 105/65   Pulse 83   Temp (!) 97.3 F (36.3 C)   Resp 14   Ht 5' 4 (1.626 m)   Wt 193 lb (87.5 kg)   BMI 33.13 kg/m    Physical Exam Vitals and nursing note reviewed.  Constitutional:      Appearance: She is well-developed.  HENT:     Head: Normocephalic and atraumatic.  Eyes:     Conjunctiva/sclera: Conjunctivae normal.  Cardiovascular:     Rate and Rhythm: Normal rate and regular rhythm.     Heart sounds: Normal heart sounds.  Pulmonary:     Effort: Pulmonary effort is normal.     Breath sounds: Normal breath sounds.  Abdominal:     General: Bowel sounds are normal.     Palpations: Abdomen is soft.  Musculoskeletal:     Cervical back: Normal range of motion.  Lymphadenopathy:     Cervical: No cervical adenopathy.  Skin:    General: Skin is warm and dry.     Capillary Refill: Capillary refill takes less than 2 seconds.  Neurological:     Mental Status: She is alert  and oriented to person, place, and time.  Psychiatric:        Behavior: Behavior normal.      Musculoskeletal Exam: She had good range of motion of the cervical spine.  Thoracic kyphosis was noted.  There was no tenderness over lumbar spine.  Shoulders, elbows and wrist joints with good range of motion.  She had limited extension of her left first MCP joint due to previous surgery.  PIP and DIP thickening was noted.  Right hip joint was replaced which was in good range of motion.  She discomfort and limited range of motion of left hip joint.  Both knee joints were replaced and were in good range of motion.  There was no tenderness over MTPs or PIPs.  No synovitis was noted on the examination.  CDAI Exam: CDAI Score: -- Patient Global: --; Provider Global: -- Swollen: --; Tender: -- Joint Exam 02/15/2024   No joint exam has been documented for this visit   There is currently no information documented on the homunculus. Go to the Rheumatology activity and complete the homunculus joint exam.  Investigation: No additional findings.  Imaging: No results found.  Recent Labs: Lab Results  Component Value Date   WBC 6.2 01/06/2024   HGB 14.0 01/06/2024   PLT 354 01/06/2024   NA 138 01/06/2024   K 4.6 01/06/2024   CL 104 01/06/2024   CO2 27 01/06/2024   GLUCOSE 105 (H) 01/06/2024   BUN 22 01/06/2024   CREATININE 0.91 01/06/2024   BILITOT 0.5 01/06/2024   ALKPHOS 36 (L) 04/07/2023   AST 20 01/06/2024   ALT 11 01/06/2024   PROT 7.3 01/06/2024   ALBUMIN 4.0 04/07/2023   CALCIUM 9.4 01/06/2024   GFRAA 73 09/05/2020   QFTBGOLDPLUS NEGATIVE 06/28/2019    Speciality Comments: No specialty comments available.  Procedures:  No procedures performed Allergies: Lipitor [atorvastatin calcium]   Assessment / Plan:     Visit Diagnoses: Rheumatoid arthritis of multiple sites with negative rheumatoid factor (HCC)-she denies having a rheumatoid arthritis flare.  She has been on  methotrexate  7 tablets p.o. weekly along with folic acid  2 mg daily.  No synovitis was noted  on examination today.  High risk medication use - Methotrexate  7 tablets once weekly and folic acid  2 mg by mouth daily.  January 06, 2024 CBC and CMP were normal.  She was advised to get repeat labs in December and every 3 months.  Information reimmunization was placed in the AVS.  She was advised to hold methotrexate  if she develops an infection resume her infection resolves.  She was advised that [employer] of rheumatology do not recommend holding methotrexate  for hip or knee replacement surgery.  However if she would prefer to stop methotrexate  she may stop a week prior to the surgery and may resume after 1 week if there is no infection.  Chronic right shoulder pain-she has intermittent pain.  She had good range of motion today.  Primary osteoarthritis of both hands-bilateral PIP and DIP thickening was noted.  No synovitis was noted.  S/P hip replacement, right - by Dr. Dalldorf on 04/20/23.  Primary osteoarthritis of left hip-she had limited painful range of motion of her left hip joint.  She is scheduled to have left total hip replacement on February 22, 2024.  She  Status post total bilateral knee replacement - by Dr. Dalldorf .  Good range of motion of bilateral knees.  Primary osteoarthritis of both feet-joint protection muscle strengthening was discussed.  Osteopenia of multiple sites - Kyphoplasty for L2 and L3 on 10/16/2020 performed by Dr. Beuford.DEXA 04/15/22 RFN BMD 0.883 g/cm2 with a T-scoreof -1.1  Vitamin D  deficiency-use of vitamin D  supplement was discussed.  Hair loss-patient has been experiencing hair loss.  I will refer her to dermatology.  Rash-she had erythematous scaly rash on her bilateral feet suggestive of eczema.  I will refer her to dermatology for evaluation and treatment.0  Other medical problems are listed as follows:  Diabetes mellitus type 2 with  complications (HCC)  Hypertension associated with diabetes (HCC)-blood pressure was normal today at 105/65.  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    Follow-Up Instructions: Return in about 5 months (around 07/15/2024) for Rheumatoid arthritis, Osteoarthritis.   Maya Nash, MD  Note - This record has been created using Animal nutritionist.  Chart creation errors have been sought, but may not always  have been located. Such creation errors do not reflect on  the standard of medical care.

## 2024-02-09 NOTE — Progress Notes (Signed)
 Sent message, via epic in basket, requesting orders in epic from Careers adviser.

## 2024-02-10 ENCOUNTER — Ambulatory Visit: Admitting: Rheumatology

## 2024-02-10 DIAGNOSIS — H47013 Ischemic optic neuropathy, bilateral: Secondary | ICD-10-CM

## 2024-02-10 DIAGNOSIS — E118 Type 2 diabetes mellitus with unspecified complications: Secondary | ICD-10-CM

## 2024-02-10 DIAGNOSIS — E559 Vitamin D deficiency, unspecified: Secondary | ICD-10-CM

## 2024-02-10 DIAGNOSIS — Z789 Other specified health status: Secondary | ICD-10-CM

## 2024-02-10 DIAGNOSIS — M19072 Primary osteoarthritis, left ankle and foot: Secondary | ICD-10-CM

## 2024-02-10 DIAGNOSIS — R5383 Other fatigue: Secondary | ICD-10-CM

## 2024-02-10 DIAGNOSIS — M7061 Trochanteric bursitis, right hip: Secondary | ICD-10-CM

## 2024-02-10 DIAGNOSIS — M8589 Other specified disorders of bone density and structure, multiple sites: Secondary | ICD-10-CM

## 2024-02-10 DIAGNOSIS — Z96641 Presence of right artificial hip joint: Secondary | ICD-10-CM

## 2024-02-10 DIAGNOSIS — M1612 Unilateral primary osteoarthritis, left hip: Secondary | ICD-10-CM

## 2024-02-10 DIAGNOSIS — Z720 Tobacco use: Secondary | ICD-10-CM

## 2024-02-10 DIAGNOSIS — E1159 Type 2 diabetes mellitus with other circulatory complications: Secondary | ICD-10-CM

## 2024-02-10 DIAGNOSIS — E1169 Type 2 diabetes mellitus with other specified complication: Secondary | ICD-10-CM

## 2024-02-10 DIAGNOSIS — Z8709 Personal history of other diseases of the respiratory system: Secondary | ICD-10-CM

## 2024-02-10 DIAGNOSIS — G8929 Other chronic pain: Secondary | ICD-10-CM

## 2024-02-10 DIAGNOSIS — E1136 Type 2 diabetes mellitus with diabetic cataract: Secondary | ICD-10-CM

## 2024-02-10 DIAGNOSIS — Z79899 Other long term (current) drug therapy: Secondary | ICD-10-CM

## 2024-02-10 DIAGNOSIS — M0609 Rheumatoid arthritis without rheumatoid factor, multiple sites: Secondary | ICD-10-CM

## 2024-02-10 DIAGNOSIS — M19041 Primary osteoarthritis, right hand: Secondary | ICD-10-CM

## 2024-02-10 DIAGNOSIS — Z96653 Presence of artificial knee joint, bilateral: Secondary | ICD-10-CM

## 2024-02-15 ENCOUNTER — Ambulatory Visit: Attending: Rheumatology | Admitting: Rheumatology

## 2024-02-15 ENCOUNTER — Other Ambulatory Visit: Payer: Self-pay | Admitting: Orthopaedic Surgery

## 2024-02-15 ENCOUNTER — Encounter: Payer: Self-pay | Admitting: Rheumatology

## 2024-02-15 ENCOUNTER — Other Ambulatory Visit: Payer: Self-pay | Admitting: Pharmacist

## 2024-02-15 VITALS — BP 105/65 | HR 83 | Temp 97.3°F | Resp 14 | Ht 64.0 in | Wt 193.0 lb

## 2024-02-15 DIAGNOSIS — M0609 Rheumatoid arthritis without rheumatoid factor, multiple sites: Secondary | ICD-10-CM | POA: Diagnosis not present

## 2024-02-15 DIAGNOSIS — E1169 Type 2 diabetes mellitus with other specified complication: Secondary | ICD-10-CM

## 2024-02-15 DIAGNOSIS — M19071 Primary osteoarthritis, right ankle and foot: Secondary | ICD-10-CM | POA: Diagnosis not present

## 2024-02-15 DIAGNOSIS — M25511 Pain in right shoulder: Secondary | ICD-10-CM

## 2024-02-15 DIAGNOSIS — R21 Rash and other nonspecific skin eruption: Secondary | ICD-10-CM

## 2024-02-15 DIAGNOSIS — M19042 Primary osteoarthritis, left hand: Secondary | ICD-10-CM

## 2024-02-15 DIAGNOSIS — M8589 Other specified disorders of bone density and structure, multiple sites: Secondary | ICD-10-CM

## 2024-02-15 DIAGNOSIS — M19041 Primary osteoarthritis, right hand: Secondary | ICD-10-CM

## 2024-02-15 DIAGNOSIS — Z96653 Presence of artificial knee joint, bilateral: Secondary | ICD-10-CM

## 2024-02-15 DIAGNOSIS — M19072 Primary osteoarthritis, left ankle and foot: Secondary | ICD-10-CM

## 2024-02-15 DIAGNOSIS — G8929 Other chronic pain: Secondary | ICD-10-CM

## 2024-02-15 DIAGNOSIS — Z79899 Other long term (current) drug therapy: Secondary | ICD-10-CM

## 2024-02-15 DIAGNOSIS — I152 Hypertension secondary to endocrine disorders: Secondary | ICD-10-CM

## 2024-02-15 DIAGNOSIS — E118 Type 2 diabetes mellitus with unspecified complications: Secondary | ICD-10-CM

## 2024-02-15 DIAGNOSIS — E1136 Type 2 diabetes mellitus with diabetic cataract: Secondary | ICD-10-CM

## 2024-02-15 DIAGNOSIS — Z96641 Presence of right artificial hip joint: Secondary | ICD-10-CM | POA: Diagnosis not present

## 2024-02-15 DIAGNOSIS — E785 Hyperlipidemia, unspecified: Secondary | ICD-10-CM

## 2024-02-15 DIAGNOSIS — L659 Nonscarring hair loss, unspecified: Secondary | ICD-10-CM

## 2024-02-15 DIAGNOSIS — E559 Vitamin D deficiency, unspecified: Secondary | ICD-10-CM

## 2024-02-15 DIAGNOSIS — Z8709 Personal history of other diseases of the respiratory system: Secondary | ICD-10-CM

## 2024-02-15 DIAGNOSIS — M1612 Unilateral primary osteoarthritis, left hip: Secondary | ICD-10-CM | POA: Diagnosis not present

## 2024-02-15 DIAGNOSIS — E1159 Type 2 diabetes mellitus with other circulatory complications: Secondary | ICD-10-CM

## 2024-02-15 DIAGNOSIS — H47013 Ischemic optic neuropathy, bilateral: Secondary | ICD-10-CM

## 2024-02-15 DIAGNOSIS — Z720 Tobacco use: Secondary | ICD-10-CM

## 2024-02-15 DIAGNOSIS — Z789 Other specified health status: Secondary | ICD-10-CM

## 2024-02-15 NOTE — Patient Instructions (Signed)
 SURGICAL WAITING ROOM VISITATION Patients having surgery or a procedure may have no more than 2 support people in the waiting area - these visitors may rotate in the visitor waiting room.   Due to an increase in RSV and influenza rates and associated hospitalizations, children ages 38 and under may not visit patients in [facility] hospitals. If the patient needs to stay at the hospital during part of their recovery, the visitor guidelines for inpatient rooms apply.  PRE-OP VISITATION  Pre-op nurse will coordinate an appropriate time for 1 support person to accompany the patient in pre-op.  This support person may not rotate.  This visitor will be contacted when the time is appropriate for the visitor to come back in the pre-op area.  Please refer to the [facility] website for the visitor guidelines for Inpatients (after your surgery is over and you are in a regular room).  You are not required to quarantine at this time prior to your surgery. However, you must do this: Hand Hygiene often Do NOT share personal items Notify your provider if you are in close contact with someone who has COVID or you develop fever 100.4 or greater, new onset of sneezing, cough, sore throat, shortness of breath or body aches.  If you test positive for Covid or have been in contact with anyone that has tested positive in the last 10 days please notify you surgeon.    Your procedure is scheduled on:  02/22/24  Report to [facility] Main Entrance: [address] entrance where the [employer] is available.   Report to admitting at: 12:00 PM  Call this number if you have any questions or problems the morning of surgery [phone]  FOLLOW ANY ADDITIONAL PRE OP INSTRUCTIONS YOU RECEIVED FROM YOUR SURGEON'S OFFICE!!!  Do not eat food after Midnight the night prior to your surgery/procedure.  After Midnight you may have the following liquids until: 11:30 AM DAY OF SURGERY  Clear Liquid Diet Water Black  Coffee (sugar ok, NO MILK/CREAM OR CREAMERS)  Tea (sugar ok, NO MILK/CREAM OR CREAMERS) regular and decaf                             Plain Jell-O  with no fruit (NO RED)                                           Fruit ices (not with fruit pulp, NO RED)                                     Popsicles (NO RED)                                                                  Juice: NO CITRUS JUICES: only apple, WHITE grape, WHITE cranberry Sports drinks like Gatorade or Powerade (NO RED)   The day of surgery:  Drink ONE (1) Pre-Surgery Clear  G2 at : 11:30 AM the morning of surgery. Drink in one sitting. Do not sip.  This drink was  given to you during your hospital pre-op appointment visit. Nothing else to drink after completing the Pre-Surgery Clear Ensure or G2 : No candy, chewing gum or throat lozenges.    Oral Hygiene is also important to reduce your risk of infection.        Remember - BRUSH YOUR TEETH THE MORNING OF SURGERY WITH YOUR REGULAR TOOTHPASTE  Do NOT smoke after Midnight the night before surgery.  STOP TAKING all Vitamins, Herbs and supplements 1 week before your surgery.   Take ONLY these medicines the morning of surgery with A SIP OF WATER: NONE. Tylenol  as needed.   DO NOT take diabetes medicines the day of surgery.                   You may not have any metal on your body including hair pins, jewelry, and body piercing  Do not wear make-up, lotions, powders, perfumes / cologne, or deodorant  Do not wear nail polish including gel and S&S, artificial / acrylic nails, or any other type of covering on natural nails including finger and toenails. If you have artificial nails, gel coating, etc., that needs to be removed by a nail salon, Please have this removed prior to surgery. Not doing so may mean that your surgery could be cancelled or delayed if the Surgeon or anesthesia staff feels like they are unable to monitor you safely.   Do not shave 48 hours prior to surgery to  avoid nicks in your skin which may contribute to postoperative infections.   Contacts, Hearing Aids, dentures or bridgework may not be worn into surgery. DENTURES WILL BE REMOVED PRIOR TO SURGERY PLEASE DO NOT APPLY Poly grip OR ADHESIVES!!!  You may bring a small overnight bag with you on the day of surgery, only pack items that are not valuable. [facility] IS NOT RESPONSIBLE   FOR VALUABLES THAT ARE LOST OR STOLEN.   Patients discharged on the day of surgery will not be allowed to drive home.  Someone NEEDS to stay with you for the first 24 hours after anesthesia.  Do not bring your home medications to the hospital. The Pharmacy will dispense medications listed on your medication list to you during your admission in the Hospital.  Special Instructions: Bring a copy of your healthcare power of attorney and living will documents the day of surgery, if you wish to have them scanned into your [facility] Records- EPIC  Please read over the following fact sheets you were given: IF YOU HAVE QUESTIONS ABOUT YOUR PRE-OP INSTRUCTIONS, PLEASE CALL [phone]  PATIENT SIGNATURE_________________________________  NURSE SIGNATURE__________________________________    Pre-operative 4 CHG Bath Instructions  DYNA-Hex 4 Chlorhexidine  Gluconate 4% Solution Antiseptic 4 fl. oz   You can play a key role in reducing the risk of infection after surgery. Your skin needs to be as free of germs as possible. You can reduce the number of germs on your skin by washing with CHG (chlorhexidine  gluconate) soap before surgery. CHG is an antiseptic soap that kills germs and continues to kill germs even after washing.   DO NOT use if you have an allergy  to chlorhexidine /CHG or antibacterial soaps. If your skin becomes reddened or irritated, stop using the CHG and notify one of our RNs at   Please shower with the CHG soap starting 4 days before surgery using the following schedule:     Please keep in mind  the following:  DO NOT shave, including legs and underarms, starting the  day of your first shower.   You may shave your face at any point before/day of surgery.  Place clean sheets on your bed the day you start using CHG soap. Use a clean washcloth (not used since being washed) for each shower. DO NOT sleep with pets once you start using the CHG.  CHG Shower Instructions:  If you choose to wash your hair and private area, wash first with your normal shampoo/soap.  After you use shampoo/soap, rinse your hair and body thoroughly to remove shampoo/soap residue.  Turn the water OFF and apply about 3 tablespoons (45 ml) of CHG soap to a CLEAN washcloth.  Apply CHG soap ONLY FROM YOUR NECK DOWN TO YOUR TOES (washing for 3-5 minutes)  DO NOT use CHG soap on face, private areas, open wounds, or sores.  Pay special attention to the area where your surgery is being performed.  If you are having back surgery, having someone wash your back for you may be helpful. Wait 2 minutes after CHG soap is applied, then you may rinse off the CHG soap.  Pat dry with a clean towel  Put on clean clothes/pajamas   If you choose to wear lotion, please use ONLY the CHG-compatible lotions on the back of this paper.     Additional instructions for the day of surgery: DO NOT APPLY any lotions, deodorants, cologne, or perfumes.   Put on clean/comfortable clothes.  Brush your teeth.  Ask your nurse before applying any prescription medications to the skin.   CHG Compatible Lotions   Aveeno Moisturizing lotion  Cetaphil Moisturizing Cream  Cetaphil Moisturizing Lotion  Clairol Herbal Essence Moisturizing Lotion, Dry Skin  Clairol Herbal Essence Moisturizing Lotion, Extra Dry Skin  Clairol Herbal Essence Moisturizing Lotion, Normal Skin  Curel Age Defying Therapeutic Moisturizing Lotion with Alpha Hydroxy  Curel Extreme Care Body Lotion  Curel Soothing Hands Moisturizing Hand Lotion  Curel Therapeutic Moisturizing  Cream, Fragrance-Free  Curel Therapeutic Moisturizing Lotion, Fragrance-Free  Curel Therapeutic Moisturizing Lotion, Original Formula  Eucerin Daily Replenishing Lotion  Eucerin Dry Skin Therapy Plus Alpha Hydroxy Crme  Eucerin Dry Skin Therapy Plus Alpha Hydroxy Lotion  Eucerin Original Crme  Eucerin Original Lotion  Eucerin Plus Crme Eucerin Plus Lotion  Eucerin TriLipid Replenishing Lotion  Keri Anti-Bacterial Hand Lotion  Keri Deep Conditioning Original Lotion Dry Skin Formula Softly Scented  Keri Deep Conditioning Original Lotion, Fragrance Free Sensitive Skin Formula  Keri Lotion Fast Absorbing Fragrance Free Sensitive Skin Formula  Keri Lotion Fast Absorbing Softly Scented Dry Skin Formula  Keri Original Lotion  Keri Skin Renewal Lotion Keri Silky Smooth Lotion  Keri Silky Smooth Sensitive Skin Lotion  Nivea Body Creamy Conditioning Oil  Nivea Body Extra Enriched Lotion  Nivea Body Original Lotion  Nivea Body Sheer Moisturizing Lotion Nivea Crme  Nivea Skin Firming Lotion  NutraDerm 30 Skin Lotion  NutraDerm Skin Lotion  NutraDerm Therapeutic Skin Cream  NutraDerm Therapeutic Skin Lotion  ProShield Protective Hand Cream  Provon moisturizing lotion  Incentive Spirometer  An incentive spirometer is a tool that can help keep your lungs clear and active. This tool measures how well you are filling your lungs with each breath. Taking long deep breaths may help reverse or decrease the chance of developing breathing (pulmonary) problems (especially infection) following: A long period of time when you are unable to move or be active. BEFORE THE PROCEDURE  If the spirometer includes an indicator to show your best effort, your nurse or respiratory therapist will  set it to a desired goal. If possible, sit up straight or lean slightly forward. Try not to slouch. Hold the incentive spirometer in an upright position. INSTRUCTIONS FOR USE  Sit on the edge of your bed if possible,  or sit up as far as you can in bed or on a chair. Hold the incentive spirometer in an upright position. Breathe out normally. Place the mouthpiece in your mouth and seal your lips tightly around it. Breathe in slowly and as deeply as possible, raising the piston or the ball toward the top of the column. Hold your breath for 3-5 seconds or for as long as possible. Allow the piston or ball to fall to the bottom of the column. Remove the mouthpiece from your mouth and breathe out normally. Rest for a few seconds and repeat Steps 1 through 7 at least 10 times every 1-2 hours when you are awake. Take your time and take a few normal breaths between deep breaths. The spirometer may include an indicator to show your best effort. Use the indicator as a goal to work toward during each repetition. After each set of 10 deep breaths, practice coughing to be sure your lungs are clear. If you have an incision (the cut made at the time of surgery), support your incision when coughing by placing a pillow or rolled up towels firmly against it. Once you are able to get out of bed, walk around indoors and cough well. You may stop using the incentive spirometer when instructed by your caregiver.  RISKS AND COMPLICATIONS Take your time so you do not get dizzy or light-headed. If you are in pain, you may need to take or ask for pain medication before doing incentive spirometry. It is harder to take a deep breath if you are having pain. AFTER USE Rest and breathe slowly and easily. It can be helpful to keep track of a log of your progress. Your caregiver can provide you with a simple table to help with this. If you are using the spirometer at home, follow these instructions: SEEK MEDICAL CARE IF:  You are having difficultly using the spirometer. You have trouble using the spirometer as often as instructed. Your pain medication is not giving enough relief while using the spirometer. You develop fever of 100.5 F (38.1  C) or higher. SEEK IMMEDIATE MEDICAL CARE IF:  You cough up bloody sputum that had not been present before. You develop fever of 102 F (38.9 C) or greater. You develop worsening pain at or near the incision site. MAKE SURE YOU:  Understand these instructions. Will watch your condition. Will get help right away if you are not doing well or get worse. Document Released: 08/31/2006 Document Revised: 07/13/2011 Document Reviewed: 11/01/2006 [facility] Patient Information 2014 [place], [place].   ________________________________________________________________________

## 2024-02-15 NOTE — Telephone Encounter (Signed)
 Pharmacy Quality Measure Review  This patient is appearing on a report for being at risk of failing the adherence measure for hypertension (ACEi/ARB) medications this calendar year.   Medication: losartan  25mg  - take 0.5 tablet once a day Last fill date: 10/06/2023 for #45 = 90  day supply  Patient has no refills remaining. Next appointment with PCP is 03/21/2024.   Spoke with patient - she report she does need a refill on losartan .  She will have hip surgery next week 02/22/2024.   Will collaborate with provider to facilitate refill needs.  Madelin Ray, PharmD Clinical Pharmacist [facility]  Population Health [phone]

## 2024-02-15 NOTE — Patient Instructions (Signed)
 Standing Labs We placed an order today for your standing lab work.   Please have your standing labs drawn in December and every 3 months  Please have your labs drawn 2 weeks prior to your appointment so that the provider can discuss your lab results at your appointment, if possible.  Please note that you may see your imaging and lab results in MyChart before we have reviewed them. We will contact you once all results are reviewed. Please allow our office up to 72 hours to thoroughly review all of the results before contacting the office for clarification of your results.  WALK-IN LAB HOURS  Monday through Thursday from 8:00 am -12:30 pm and 1:00 pm-4:30 pm and Friday from 8:00 am-12:00 pm.  Patients with office visits requiring labs will be seen before walk-in labs.  You may encounter longer than normal wait times. Please allow additional time. Wait times may be shorter on  Monday and Thursday afternoons.  We do not book appointments for walk-in labs. We appreciate your patience and understanding with our staff.   Labs are drawn by [facility]. Please bring your co-pay at the time of your lab draw.  You may receive a bill from [facility] for your lab work.  Please note if you are on Hydroxychloroquine and and an order has been placed for a Hydroxychloroquine level,  you will need to have it drawn 4 hours or more after your last dose.  If you wish to have your labs drawn at another location, please call the office 24 hours in advance so we can fax the orders.  The office is located at [address], [address], [place], [place] [address]   If you have any questions regarding directions or hours of operation,  please call [phone].   As a reminder, please drink plenty of water prior to coming for your lab work. Thanks!   Vaccines You are taking a medication(s) that can suppress your immune system.  The following immunizations are recommended: Flu annually (high dose) Covid-19  RSV Td/Tdap  (tetanus, diphtheria, pertussis) every 10 years Pneumonia (Prevnar 15 then Pneumovax 23 at least 1 year apart.  Alternatively, can take Prevnar 20 without needing additional dose) Shingrix: 2 doses from 4 weeks to 6 months apart  Please check with your PCP to make sure you are up to date.   If you have signs or symptoms of an infection or start antibiotics: First, call your PCP for workup of your infection. Hold your medication through the infection, until you complete your antibiotics, and until symptoms resolve if you take the following: Injectable medication (Actemra, Benlysta, Cimzia, Cosentyx, Enbrel, Humira, Kevzara, Orencia, Remicade, Simponi, Stelara, Taltz, Tremfya) Methotrexate  Leflunomide (Arava) Mycophenolate (Cellcept) Xeljanz, Olumiant, or Rinvoq    The [employer] of rheumatology do not recommend to stop methotrexate  for a knee joint or hip joint replacement.  If you prefer to stop methotrexate  then you may stop 1 week prior to the surgery and may resume 1 week after the surgery if there is no infection.

## 2024-02-16 ENCOUNTER — Encounter (HOSPITAL_COMMUNITY): Payer: Self-pay

## 2024-02-16 ENCOUNTER — Ambulatory Visit (HOSPITAL_COMMUNITY)
Admission: RE | Admit: 2024-02-16 | Discharge: 2024-02-16 | Disposition: A | Discharge status: Home / Self Care | Source: Ambulatory Visit | Attending: Orthopaedic Surgery | Admitting: Orthopaedic Surgery

## 2024-02-16 ENCOUNTER — Other Ambulatory Visit: Payer: Self-pay

## 2024-02-16 ENCOUNTER — Encounter (HOSPITAL_COMMUNITY)
Admission: RE | Admit: 2024-02-16 | Discharge: 2024-02-16 | Disposition: A | Discharge status: Home / Self Care | Source: Ambulatory Visit | Attending: Orthopaedic Surgery | Admitting: Orthopaedic Surgery

## 2024-02-16 VITALS — BP 124/79 | HR 92 | Temp 98.3°F | Ht 64.0 in | Wt 190.0 lb

## 2024-02-16 DIAGNOSIS — I10 Essential (primary) hypertension: Secondary | ICD-10-CM | POA: Insufficient documentation

## 2024-02-16 DIAGNOSIS — E1169 Type 2 diabetes mellitus with other specified complication: Secondary | ICD-10-CM

## 2024-02-16 DIAGNOSIS — Z01818 Encounter for other preprocedural examination: Secondary | ICD-10-CM | POA: Insufficient documentation

## 2024-02-16 DIAGNOSIS — E785 Hyperlipidemia, unspecified: Secondary | ICD-10-CM | POA: Insufficient documentation

## 2024-02-16 HISTORY — DX: Cerebral infarction, unspecified: I63.9

## 2024-02-16 LAB — CBC
HCT: 42.2 % (ref 36.0–46.0)
Hemoglobin: 13.5 g/dL (ref 12.0–15.0)
MCH: 30.5 pg (ref 26.0–34.0)
MCHC: 32 g/dL (ref 30.0–36.0)
MCV: 95.3 fL (ref 80.0–100.0)
Platelets: 308 K/uL (ref 150–400)
RBC: 4.43 MIL/uL (ref 3.87–5.11)
RDW: 14.1 % (ref 11.5–15.5)
WBC: 5 K/uL (ref 4.0–10.5)
nRBC: 0 % (ref 0.0–0.2)

## 2024-02-16 LAB — GLUCOSE, CAPILLARY: Glucose-Capillary: 204 mg/dL — ABNORMAL HIGH (ref 70–99)

## 2024-02-16 LAB — BASIC METABOLIC PANEL WITH GFR
Anion gap: 11 (ref 5–15)
BUN: 18 mg/dL (ref 8–23)
CO2: 24 mmol/L (ref 22–32)
Calcium: 9.4 mg/dL (ref 8.9–10.3)
Chloride: 103 mmol/L (ref 98–111)
Creatinine, Ser: 0.88 mg/dL (ref 0.44–1.00)
GFR, Estimated: >60 mL/min (ref >60)
Glucose, Bld: 187 mg/dL — ABNORMAL HIGH (ref 70–99)
Potassium: 3.9 mmol/L (ref 3.5–5.1)
Sodium: 139 mmol/L (ref 135–145)

## 2024-02-16 LAB — SURGICAL PCR SCREEN
MRSA, PCR: NEGATIVE
Staphylococcus aureus: NEGATIVE

## 2024-02-16 NOTE — Progress Notes (Addendum)
 For Anesthesia: PCP - Catherine, Renee A, DO . LOV: 01/26/24 Cardiologist - N/A  Bowel Prep reminder:  Chest x-ray -  EKG -  Stress Test -  ECHO - 04/01/10 Cardiac Cath - 03/27/15 Pacemaker/ICD device last checked: Pacemaker orders received: Device Rep notified:  Spinal Cord Stimulator:N/A  Sleep Study - N/A CPAP -   Fasting Blood Sugar - 100's Checks Blood Sugar __4___ times a week. Date and result of last Hgb A1c-  Last dose of GLP1 agonist- N/A GLP1 instructions: Hold 7 days prior to schedule (Hold 24 hours-daily)  N/A Last dose of SGLT-2 inhibitors-  SGLT-2 instructions: Hold 72 hours prior to surgery  Blood Thinner Instructions:N/A Last Dose: Time last taken:  Aspirin  Instructions: Last Dose: Time last taken:  Activity level: Can go up a flight of stairs and activities of daily living without stopping and without chest pain and/or shortness of breath   Able to exercise without chest pain and/or shortness of breath  Anesthesia review: Hx: HTN,DIA,CAD,COPD,Smoker,CVA  Patient denies shortness of breath, fever, cough and chest pain at PAT appointment   Patient verbalized understanding of instructions that were reviewed over the telephone.

## 2024-02-16 NOTE — Telephone Encounter (Signed)
 This medication was discontinued prior to November 2024 per office visit note.  DISContinue losartan  12.5 mg daily

## 2024-02-17 NOTE — Telephone Encounter (Signed)
 02/17/2024 - noted PCP notes that losartan  was stopped 03/2023 but medication had remained on her medication list and refill records show that she filled losartan  in 2025.  Will notify patient that she should NOT be taking losartan .   Dispenses   Dispensed Days Supply Quantity Provider Pharmacy  LOSARTAN  POTASSIUM 25 MG TAB 10/06/2023 90 45 each Kuneff, Renee A, DO [facility] [phone] - [facility]...  losartan  (COZAAR ) tablet 06/20/2023 90 45 Kuneff, Renee A, DO [facility] [phone] - [facility]...  LOSARTAN  POTASSIUM 25 MG TAB 03/21/2023 90 45 each Kuneff, Renee A, DO [facility] [phone] - [facility].[place]

## 2024-02-17 NOTE — Progress Notes (Signed)
 Anesthesia Chart Review   Case: [id number] Date/Time: 02/22/24 0948   Procedure: ARTHROPLASTY, HIP, TOTAL, ANTERIOR APPROACH (Left: Hip)   Anesthesia type: Spinal   Pre-op diagnosis: LEFT HIP DEGENERATIVE JOINT DISEASE   Location: [facility] ROOM 06 / [facility]   Surgeons: Sheril Coy, MD       DISCUSSION:78 y.o. smoker with h/o PONV, HTN, CVA, COPD, CAD, DM II, left hip djd scheduled for above procedure 02/22/24 with Dr. Coy Sheril.   Prior anesthesia complications include PONV   Patient follows with PCP for chronic medical issues. Last seen on 01/26/2024. All issues stable. BP controlled. Last A1c was 5.9 on 9/24. Pt has diagnosis of COPD but does not see pulmonology or use inhalers. No recent exacerbations per chart review.   Patient had remote cardiac w/u in 2016. LHC showed mild-mod CAD in the RCA and LAD and medical management was recommended. She has not needed any ongoing cardiac f/u after this.   VS: BP 124/79   Pulse 92   Temp 36.8 C (Oral)   Ht 5' 4 (1.626 m)   Wt 86.2 kg   SpO2 98%   BMI 32.61 kg/m   PROVIDERS: Kuneff, Renee A, DO is PCP    LABS: Labs reviewed: Acceptable for surgery. (all labs ordered are listed, but only abnormal results are displayed)  Labs Reviewed  BASIC METABOLIC PANEL WITH GFR - Abnormal; Notable for the following components:      Result Value   Glucose, Bld 187 (*)    All other components within normal limits  GLUCOSE, CAPILLARY - Abnormal; Notable for the following components:   Glucose-Capillary 204 (*)    All other components within normal limits  SURGICAL PCR SCREEN  CBC  TYPE AND SCREEN     IMAGES:   EKG:   CV:  Past Medical History:  Diagnosis Date   Allergy     Anxiety    Arthritis    Rheumatoid Arthritis   Bursitis of right hip    Chicken pox    Colon polyp    COPD (chronic obstructive pulmonary disease) (HCC)    Coronary artery disease    CVA (cerebral infarction)    Diabetes mellitus without complication  (HCC)    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   Diverticulitis    Family history of colonic polyps 05/16/2020   History of blood transfusion    Hyperlipidemia    Hypertension    Insomnia    Internal hemorrhoids 05/16/2020   Nasal polyps    Obesity    PONV (postoperative nausea and vomiting)    Radial styloid tenosynovitis of left hand 12/21/2019   Rectal bleeding 05/16/2020   Sinusitis, chronic 03/18/2010   Qualifier: Diagnosis of  By: Eben MD, Reyes     Stroke [facility])     Past Surgical History:  Procedure Laterality Date   ABDOMINAL HYSTERECTOMY  2000   APPENDECTOMY     CARDIAC CATHETERIZATION N/A 03/27/2015   Procedure: Left Heart Cath and Coronary Angiography;  Surgeon: Gordy Bergamo, MD;  Location[facility] INVASIVE CV LAB;  Service: Cardiovascular;  Laterality: N/A;   CESAREAN SECTION     COLONOSCOPY     ELBOW SURGERY Right    FIXATION KYPHOPLASTY LUMBAR SPINE  10/16/2020   L2 and L3 kyphoplasty   HAND TENDON SURGERY Left 02/04/2022   KYPHOPLASTY N/A 10/16/2020   Procedure: LUMBAR 2 AND LUMBAR 3 KYPHOPLASTY;  Surgeon: Beuford Anes, MD;  Location: [facility] OR;  Service: Orthopedics;  Laterality: N/A;   NASAL  SINUS SURGERY     REPLACEMENT TOTAL KNEE Bilateral 2007 2009   TONSILLECTOMY AND ADENOIDECTOMY     TOTAL HIP ARTHROPLASTY Right 04/20/2023   Procedure: TOTAL HIP ARTHROPLASTY ANTERIOR APPROACH;  Surgeon: Sheril Coy, MD;  Location: [facility];  Service: Orthopedics;  Laterality: Right;   WISDOM TOOTH EXTRACTION      MEDICATIONS:  acetaminophen  (TYLENOL ) 500 MG tablet   aspirin  EC 81 MG tablet   cholecalciferol (VITAMIN D3) 25 MCG (1000 UNIT) tablet   diclofenac Sodium (VOLTAREN) 1 % GEL   fenofibrate  160 MG tablet   folic acid  (FOLVITE ) 1 MG tablet   metFORMIN  (GLUCOPHAGE ) 500 MG tablet   methotrexate  (RHEUMATREX) 2.5 MG tablet   Omega-3 Fatty Acids (FISH OIL) 1000 MG CAPS   pravastatin  (PRAVACHOL ) 20 MG tablet   sitaGLIPtin  (JANUVIA ) 50 MG tablet   zolpidem   (AMBIEN ) 5 MG tablet   No current facility-administered medications for this encounter.      Harlene Hoots Ward, PA-C [facility] Pre-Surgical Testing [phone]

## 2024-02-17 NOTE — Care Plan (Signed)
 Ortho Bundle Case Management Note  Patient Details  Name: [name] MRN: [id number] Date of Birth: 05-04-1946  spoke with patient. she will discharge to home with her family to assist. has rolling walker. OPPT set up with [facility] [address] discharge instructions mailed and discussed. Patient and MD in agreement with plan. Choice offered.                     DME Arranged:    DME Agency:     HH Arranged:    HH Agency:     Additional Comments: Please contact me with any questions of if this plan should need to change.  Charlies Pitch,  RN,BSN,MHA,CCM  [facility]  [phone] 02/17/2024, 1:53 PM

## 2024-02-19 ENCOUNTER — Other Ambulatory Visit: Payer: Self-pay | Admitting: Physician Assistant

## 2024-02-19 DIAGNOSIS — M0609 Rheumatoid arthritis without rheumatoid factor, multiple sites: Secondary | ICD-10-CM

## 2024-02-21 NOTE — H&P (Signed)
 TOTAL HIP ADMISSION H&P  Patient is admitted for left total hip arthroplasty.  Subjective:  Chief Complaint: left hip pain  HPI: [name], 78 y.o. female, has a history of pain and functional disability in the left hip(s) due to arthritis and patient has failed non-surgical conservative treatments for greater than 12 weeks to include NSAID's and/or analgesics, corticosteriod injections, viscosupplementation injections, flexibility and strengthening excercises, supervised PT with diminished ADL's post treatment, use of assistive devices, weight reduction as appropriate, and activity modification.  Onset of symptoms was gradual starting 5 years ago with gradually worsening course since that time.The patient noted no past surgery on the left hip(s).  Patient currently rates pain in the left hip at 10 out of 10 with activity. Patient has night pain, worsening of pain with activity and weight bearing, trendelenberg gait, pain that interfers with activities of daily living, and crepitus. Patient has evidence of subchondral cysts, subchondral sclerosis, periarticular osteophytes, and joint space narrowing by imaging studies. This condition presents safety issues increasing the risk of falls.  There is no current active infection.  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 04/22/2018   Hypermetropia of both eyes 04/22/2018   Hyperopia with presbyopia 04/22/2018   Osteopenia 02/14/2016   Benign hypertension 02/16/2012   Occasional cigarette smoker 04/07/2010    DM2 w/ Hyperlipidemia (HCC) 03/18/2010   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   Diverticulitis    Family history of colonic polyps 05/16/2020   History of blood transfusion    Hyperlipidemia    Hypertension    Insomnia    Internal hemorrhoids 05/16/2020   Nasal polyps    Obesity    PONV (postoperative nausea and vomiting)    Radial styloid tenosynovitis of left hand 12/21/2019   Rectal bleeding 05/16/2020   Sinusitis, chronic 03/18/2010   Qualifier: Diagnosis of  By: Eben MD, Reyes     Stroke [facility])     Past Surgical History:  Procedure Laterality Date   ABDOMINAL HYSTERECTOMY  2000   APPENDECTOMY     CARDIAC CATHETERIZATION N/A 03/27/2015   Procedure: Left Heart Cath and Coronary Angiography;  Surgeon: Gordy Bergamo, MD;  Location: [facility] INVASIVE CV LAB;  Service: Cardiovascular;  Laterality: N/A;   CESAREAN SECTION     COLONOSCOPY     ELBOW SURGERY Right    FIXATION KYPHOPLASTY LUMBAR SPINE  10/16/2020   L2 and L3 kyphoplasty   HAND TENDON SURGERY Left 02/04/2022   KYPHOPLASTY N/A 10/16/2020   Procedure: LUMBAR 2 AND LUMBAR 3 KYPHOPLASTY;  Surgeon: Beuford Anes, MD;  Location: [facility] OR;  Service: Orthopedics;  Laterality: N/A;   NASAL SINUS SURGERY     REPLACEMENT TOTAL  KNEE Bilateral 2007 2009   TONSILLECTOMY AND ADENOIDECTOMY     TOTAL HIP ARTHROPLASTY Right 04/20/2023   Procedure: TOTAL HIP ARTHROPLASTY ANTERIOR APPROACH;  Surgeon: Sheril Coy, MD;  Location: [facility] ORS;  Service: Orthopedics;  Laterality: Right;   WISDOM TOOTH EXTRACTION      No current facility-administered medications for this encounter.   Current Outpatient Medications  Medication  Sig Dispense Refill Last Dose/Taking   acetaminophen  (TYLENOL ) 500 MG tablet Take 500 mg by mouth in the morning and at bedtime.   Taking   aspirin  EC 81 MG tablet Take 81 mg by mouth at bedtime. Swallow whole.   Taking   cholecalciferol (VITAMIN D3) 25 MCG (1000 UNIT) tablet Take 1,000 Units by mouth in the morning.   Taking   diclofenac Sodium (VOLTAREN) 1 % GEL Apply 1 Application topically 4 (four) times daily as needed (pain.).   Taking As Needed   fenofibrate  160 MG tablet Take 1 tablet (160 mg total) by mouth daily. 90 tablet 3 Taking   metFORMIN  (GLUCOPHAGE ) 500 MG tablet Take 1 tablet (500 mg total) by mouth 2 (two) times daily. 180 tablet 1 Taking   methotrexate  (RHEUMATREX) 2.5 MG tablet TAKE 7 TABLETS BY MOUTH ONCE WEEKLY. CAUTION:CHEMOTHERAPY. PROTECT FROM LIGHT. 84 tablet 0 Taking Differently   Omega-3 Fatty Acids (FISH OIL) 1000 MG CAPS Take 1,000 mg by mouth 2 (two) times daily.   Taking   pravastatin  (PRAVACHOL ) 20 MG tablet Take 1 tablet (20 mg total) by mouth daily. 90 tablet 2 Taking Differently   sitaGLIPtin  (JANUVIA ) 50 MG tablet Take 1 tablet (50 mg total) by mouth daily. 90 tablet 1 Taking   zolpidem  (AMBIEN ) 5 MG tablet Take 1 tablet (5 mg total) by mouth at bedtime as needed for sleep. (Patient taking differently: Take 1.67 mg by mouth at bedtime as needed for sleep.) 90 tablet 1 Taking Differently   folic acid  (FOLVITE ) 1 MG tablet TAKE 2 TABLETS BY MOUTH EVERY DAY 180 tablet 3    Allergies  Allergen Reactions   Lipitor [Atorvastatin Calcium]     Myalgia    Social History   Tobacco Use   Smoking status: Every Day    Current packs/day: 0.50    Average packs/day: 0.5 packs/day for 35.0 years (17.5 ttl pk-yrs)    Types: Cigarettes    Passive exposure: Never   Smokeless tobacco: Never   Tobacco comments:    6 cigarettes per day   Substance Use Topics   Alcohol use: No    Alcohol/week: 0.0 standard drinks of alcohol    Family History  Problem Relation Age of  Onset   Arthritis Mother    Diabetes Mother        Type I   Aneurysm Father    Stroke Father    Arthritis Sister    Arthritis Sister    Down syndrome Son    Diabetes Son        type I     Review of Systems  Musculoskeletal:  Positive for arthralgias.       Left hip   All other systems reviewed and are negative.   Objective:  Physical Exam Constitutional:      Appearance: Normal appearance.  HENT:     Head: Normocephalic and atraumatic.     Nose: Nose normal.     Mouth/Throat:     Pharynx: Oropharynx is clear.  Eyes:     Extraocular Movements: Extraocular movements intact.  Pulmonary:  Effort: Pulmonary effort is normal.  Abdominal:     Palpations: Abdomen is soft.  Musculoskeletal:     Cervical back: Normal range of motion.     Comments: Examination of the left hip shows significantly limited range of motion with terrible pain at any internal or external rotation.  She walks with an altered gait.  Opposite hip has full range of motion without pain.  She is neurovascularly intact distally bilaterally.    Skin:    General: Skin is warm and dry.  Neurological:     General: No focal deficit present.     Mental Status: She is alert and oriented to person, place, and time. Mental status is at baseline.  Psychiatric:        Mood and Affect: Mood normal.        Behavior: Behavior normal.        Thought Content: Thought content normal.        Judgment: Judgment normal.     Vital signs in last 24 hours:    Labs:   Estimated body mass index is 32.61 kg/m as calculated from the following:   Height as of 02/16/24: 5' 4 (1.626 m).   Weight as of 02/16/24: 86.2 kg.   Imaging Review Plain radiographs demonstrate severe degenerative joint disease of the left hip(s). The bone quality appears to be good for age and reported activity level.      Assessment/Plan:  End stage primary arthritis, left hip(s)  The patient history, physical examination, clinical  judgement of the provider and imaging studies are consistent with end stage degenerative joint disease of the left hip(s) and total hip arthroplasty is deemed medically necessary. The treatment options including medical management, injection therapy, arthroscopy and arthroplasty were discussed at length. The risks and benefits of total hip arthroplasty were presented and reviewed. The risks due to aseptic loosening, infection, stiffness, dislocation/subluxation,  thromboembolic complications and other imponderables were discussed.  The patient acknowledged the explanation, agreed to proceed with the plan and consent was signed. Patient is being admitted for inpatient treatment for surgery, pain control, PT, OT, prophylactic antibiotics, VTE prophylaxis, progressive ambulation and ADL's and discharge planning.The patient is planning to be discharged home with home health services

## 2024-02-21 NOTE — Telephone Encounter (Signed)
 Last Fill: 03/15/2023  Next Visit: 06/09/2024  Last Visit: 02/15/2024  DX: Rheumatoid arthritis of multiple sites with negative rheumatoid factor   Current Dose per office note on 02/15/2024: folic acid  2 mg by mouth daily.   Okay to refill folic acid ?

## 2024-02-22 ENCOUNTER — Observation Stay (HOSPITAL_COMMUNITY)
Admission: RE | Admit: 2024-02-22 | Discharge: 2024-02-23 | Disposition: A | Discharge status: Home / Self Care | Source: Ambulatory Visit | Attending: Orthopaedic Surgery | Admitting: Orthopaedic Surgery

## 2024-02-22 ENCOUNTER — Encounter (HOSPITAL_COMMUNITY): Payer: Self-pay | Admitting: Orthopaedic Surgery

## 2024-02-22 ENCOUNTER — Ambulatory Visit (HOSPITAL_COMMUNITY)

## 2024-02-22 ENCOUNTER — Ambulatory Visit (HOSPITAL_COMMUNITY): Admitting: Anesthesiology

## 2024-02-22 ENCOUNTER — Ambulatory Visit (HOSPITAL_COMMUNITY): Payer: Self-pay | Admitting: Physician Assistant

## 2024-02-22 ENCOUNTER — Other Ambulatory Visit: Payer: Self-pay

## 2024-02-22 ENCOUNTER — Encounter (HOSPITAL_COMMUNITY): Admission: RE | Disposition: A | Payer: Self-pay | Source: Ambulatory Visit | Attending: Orthopaedic Surgery

## 2024-02-22 DIAGNOSIS — Z7984 Long term (current) use of oral hypoglycemic drugs: Secondary | ICD-10-CM | POA: Insufficient documentation

## 2024-02-22 DIAGNOSIS — I2510 Atherosclerotic heart disease of native coronary artery without angina pectoris: Secondary | ICD-10-CM

## 2024-02-22 DIAGNOSIS — M1612 Unilateral primary osteoarthritis, left hip: Principal | ICD-10-CM | POA: Diagnosis present

## 2024-02-22 DIAGNOSIS — E119 Type 2 diabetes mellitus without complications: Secondary | ICD-10-CM | POA: Insufficient documentation

## 2024-02-22 DIAGNOSIS — Z7982 Long term (current) use of aspirin: Secondary | ICD-10-CM | POA: Diagnosis not present

## 2024-02-22 DIAGNOSIS — I10 Essential (primary) hypertension: Secondary | ICD-10-CM | POA: Diagnosis not present

## 2024-02-22 DIAGNOSIS — F17210 Nicotine dependence, cigarettes, uncomplicated: Secondary | ICD-10-CM | POA: Insufficient documentation

## 2024-02-22 DIAGNOSIS — J449 Chronic obstructive pulmonary disease, unspecified: Secondary | ICD-10-CM | POA: Diagnosis not present

## 2024-02-22 HISTORY — PX: TOTAL HIP ARTHROPLASTY: SHX124

## 2024-02-22 LAB — TYPE AND SCREEN
ABO/RH(D): AB POS
Antibody Screen: NEGATIVE

## 2024-02-22 LAB — GLUCOSE, CAPILLARY
Glucose-Capillary: 108 mg/dL — ABNORMAL HIGH (ref 70–99)
Glucose-Capillary: 121 mg/dL — ABNORMAL HIGH (ref 70–99)
Glucose-Capillary: 157 mg/dL — ABNORMAL HIGH (ref 70–99)
Glucose-Capillary: 197 mg/dL — ABNORMAL HIGH (ref 70–99)

## 2024-02-22 SURGERY — ARTHROPLASTY, HIP, TOTAL, ANTERIOR APPROACH
Anesthesia: Monitor Anesthesia Care | Site: Hip | Laterality: Left

## 2024-02-22 MED ORDER — ACETAMINOPHEN 500 MG PO TABS
500.0000 mg | ORAL_TABLET | Freq: Four times a day (QID) | ORAL | Status: DC
  Filled 2024-02-22: qty 1

## 2024-02-22 MED ORDER — DOCUSATE SODIUM 100 MG PO CAPS
100.0000 mg | ORAL_CAPSULE | Freq: Two times a day (BID) | ORAL | Status: DC
  Administered 2024-02-22 – 2024-02-23 (×2): 100 mg via ORAL
  Filled 2024-02-22 (×2): qty 1

## 2024-02-22 MED ORDER — PHENOL 1.4 % MT LIQD: 1.0000 | OROMUCOSAL | Status: DC | PRN

## 2024-02-22 MED ORDER — METFORMIN HCL 500 MG PO TABS
500.0000 mg | ORAL_TABLET | Freq: Two times a day (BID) | ORAL | Status: DC
  Administered 2024-02-22 – 2024-02-23 (×2): 500 mg via ORAL
  Filled 2024-02-22 (×2): qty 1

## 2024-02-22 MED ORDER — METHOCARBAMOL 500 MG PO TABS
500.0000 mg | ORAL_TABLET | Freq: Four times a day (QID) | ORAL | Status: DC | PRN
  Administered 2024-02-22: 500 mg via ORAL
  Filled 2024-02-22: qty 1

## 2024-02-22 MED ORDER — TRANEXAMIC ACID-NACL 1000-0.7 MG/100ML-% IV SOLN
1000.0000 mg | Freq: Once | INTRAVENOUS | Status: AC
  Administered 2024-02-22: 1000 mg via INTRAVENOUS
  Filled 2024-02-22: qty 100

## 2024-02-22 MED ORDER — LACTATED RINGERS IV SOLN: INTRAVENOUS | Status: DC

## 2024-02-22 MED ORDER — LACTATED RINGERS IV SOLN
INTRAVENOUS | Status: DC
Start: 2024-02-22 — End: 2024-02-22

## 2024-02-22 MED ORDER — SODIUM CHLORIDE 0.9 % IV BOLUS
500.0000 mL | Freq: Once | INTRAVENOUS | Status: AC
  Administered 2024-02-22: 500 mL via INTRAVENOUS

## 2024-02-22 MED ORDER — ONDANSETRON HCL 4 MG/2ML IJ SOLN: 4.0000 mg | Freq: Four times a day (QID) | INTRAMUSCULAR | Status: DC | PRN

## 2024-02-22 MED ORDER — LINAGLIPTIN 5 MG PO TABS
5.0000 mg | ORAL_TABLET | Freq: Every day | ORAL | Status: DC
  Administered 2024-02-22 – 2024-02-23 (×2): 5 mg via ORAL
  Filled 2024-02-22 (×2): qty 1

## 2024-02-22 MED ORDER — SODIUM CHLORIDE (PF) 0.9 % IJ SOLN
INTRAMUSCULAR | Status: DC | PRN
  Administered 2024-02-22: 80 mL

## 2024-02-22 MED ORDER — PRAVASTATIN SODIUM 20 MG PO TABS
20.0000 mg | ORAL_TABLET | Freq: Every day | ORAL | Status: DC
  Administered 2024-02-22 – 2024-02-23 (×2): 20 mg via ORAL
  Filled 2024-02-22 (×2): qty 1

## 2024-02-22 MED ORDER — MIDAZOLAM HCL 5 MG/5ML IJ SOLN
INTRAMUSCULAR | Status: DC | PRN
  Administered 2024-02-22 (×2): 1 mg via INTRAVENOUS

## 2024-02-22 MED ORDER — ORAL CARE MOUTH RINSE: 15.0000 mL | Freq: Once | OROMUCOSAL | Status: AC

## 2024-02-22 MED ORDER — ONDANSETRON HCL 4 MG PO TABS: 4.0000 mg | ORAL_TABLET | Freq: Four times a day (QID) | ORAL | Status: DC | PRN

## 2024-02-22 MED ORDER — TIZANIDINE HCL 4 MG PO TABS: 4.0000 mg | ORAL_TABLET | Freq: Four times a day (QID) | ORAL | 1 refills | Status: DC | PRN

## 2024-02-22 MED ORDER — BUPIVACAINE LIPOSOME 1.3 % IJ SUSP: 10.0000 mL | Freq: Once | INTRAMUSCULAR | Status: DC

## 2024-02-22 MED ORDER — INSULIN ASPART 100 UNIT/ML IJ SOLN: 0.0000 [IU] | INTRAMUSCULAR | Status: DC | PRN

## 2024-02-22 MED ORDER — BISACODYL 5 MG PO TBEC: 5.0000 mg | DELAYED_RELEASE_TABLET | Freq: Every day | ORAL | Status: DC | PRN

## 2024-02-22 MED ORDER — BUPIVACAINE LIPOSOME 1.3 % IJ SUSP
INTRAMUSCULAR | Status: AC
  Filled 2024-02-22: qty 20

## 2024-02-22 MED ORDER — POVIDONE-IODINE 10 % EX SWAB: 2.0000 | Freq: Once | CUTANEOUS | Status: DC

## 2024-02-22 MED ORDER — BUPIVACAINE IN DEXTROSE 0.75-8.25 % IT SOLN
INTRATHECAL | Status: DC | PRN
  Administered 2024-02-22: 1.6 mL via INTRATHECAL

## 2024-02-22 MED ORDER — KETOROLAC TROMETHAMINE 15 MG/ML IJ SOLN
7.5000 mg | Freq: Four times a day (QID) | INTRAMUSCULAR | Status: DC
  Administered 2024-02-22 – 2024-02-23 (×3): 7.5 mg via INTRAVENOUS
  Filled 2024-02-22 (×3): qty 1

## 2024-02-22 MED ORDER — METHOCARBAMOL 1000 MG/10ML IJ SOLN: 500.0000 mg | Freq: Four times a day (QID) | INTRAMUSCULAR | Status: DC | PRN

## 2024-02-22 MED ORDER — ACETAMINOPHEN 10 MG/ML IV SOLN: 1000.0000 mg | Freq: Once | INTRAVENOUS | Status: DC | PRN

## 2024-02-22 MED ORDER — DEXAMETHASONE SODIUM PHOSPHATE 4 MG/ML IJ SOLN
INTRAMUSCULAR | Status: DC | PRN
Start: 2024-02-22 — End: 2024-02-22
  Administered 2024-02-22: 4 mg via INTRAVENOUS

## 2024-02-22 MED ORDER — DIPHENHYDRAMINE HCL 12.5 MG/5ML PO ELIX: 12.5000 mg | ORAL_SOLUTION | ORAL | Status: DC | PRN

## 2024-02-22 MED ORDER — FOLIC ACID 1 MG PO TABS
2.0000 mg | ORAL_TABLET | Freq: Every day | ORAL | Status: DC
  Administered 2024-02-22 – 2024-02-23 (×2): 2 mg via ORAL
  Filled 2024-02-22 (×2): qty 2

## 2024-02-22 MED ORDER — ACETAMINOPHEN 325 MG PO TABS: 325.0000 mg | ORAL_TABLET | Freq: Four times a day (QID) | ORAL | Status: DC | PRN

## 2024-02-22 MED ORDER — HYDROCODONE-ACETAMINOPHEN 5-325 MG PO TABS
1.0000 | ORAL_TABLET | ORAL | Status: DC | PRN
  Administered 2024-02-22: 2 via ORAL
  Administered 2024-02-22 – 2024-02-23 (×3): 1 via ORAL
  Filled 2024-02-22 (×2): qty 1
  Filled 2024-02-22: qty 2
  Filled 2024-02-22: qty 1

## 2024-02-22 MED ORDER — CHLORHEXIDINE GLUCONATE 0.12 % MT SOLN
15.0000 mL | Freq: Once | OROMUCOSAL | Status: AC
  Administered 2024-02-22: 15 mL via OROMUCOSAL

## 2024-02-22 MED ORDER — ALUM & MAG HYDROXIDE-SIMETH 200-200-20 MG/5ML PO SUSP: 30.0000 mL | ORAL | Status: DC | PRN

## 2024-02-22 MED ORDER — MIDAZOLAM HCL 2 MG/2ML IJ SOLN
INTRAMUSCULAR | Status: AC
Start: 2024-02-22 — End: 2024-02-22
  Filled 2024-02-22: qty 2

## 2024-02-22 MED ORDER — ASPIRIN 81 MG PO CHEW: 81.0000 mg | CHEWABLE_TABLET | Freq: Two times a day (BID) | ORAL | Status: DC

## 2024-02-22 MED ORDER — DROPERIDOL 2.5 MG/ML IJ SOLN: 0.6250 mg | Freq: Once | INTRAMUSCULAR | Status: DC | PRN

## 2024-02-22 MED ORDER — CEFAZOLIN SODIUM-DEXTROSE 2-4 GM/100ML-% IV SOLN
2.0000 g | Freq: Four times a day (QID) | INTRAVENOUS | Status: AC
  Administered 2024-02-22 (×2): 2 g via INTRAVENOUS
  Filled 2024-02-22 (×2): qty 100

## 2024-02-22 MED ORDER — HYDROCODONE-ACETAMINOPHEN 7.5-325 MG PO TABS
1.0000 | ORAL_TABLET | ORAL | Status: DC | PRN
  Filled 2024-02-22: qty 1

## 2024-02-22 MED ORDER — ALBUMIN HUMAN 5 % IV SOLN: INTRAVENOUS | Status: DC | PRN

## 2024-02-22 MED ORDER — FENTANYL CITRATE (PF) 100 MCG/2ML IJ SOLN
INTRAMUSCULAR | Status: AC
  Filled 2024-02-22: qty 2

## 2024-02-22 MED ORDER — ONDANSETRON HCL 4 MG/2ML IJ SOLN
INTRAMUSCULAR | Status: DC | PRN
  Administered 2024-02-22: 4 mg via INTRAVENOUS

## 2024-02-22 MED ORDER — TRANEXAMIC ACID-NACL 1000-0.7 MG/100ML-% IV SOLN
1000.0000 mg | INTRAVENOUS | Status: AC
Start: 2024-02-22 — End: 2024-02-22
  Administered 2024-02-22: 1000 mg via INTRAVENOUS
  Filled 2024-02-22: qty 100

## 2024-02-22 MED ORDER — FENTANYL CITRATE (PF) 100 MCG/2ML IJ SOLN
INTRAMUSCULAR | Status: DC | PRN
  Administered 2024-02-22 (×2): 50 ug via INTRAVENOUS

## 2024-02-22 MED ORDER — FENTANYL CITRATE (PF) 50 MCG/ML IJ SOSY: 25.0000 ug | PREFILLED_SYRINGE | INTRAMUSCULAR | Status: DC | PRN

## 2024-02-22 MED ORDER — METOCLOPRAMIDE HCL 5 MG/ML IJ SOLN: 5.0000 mg | Freq: Three times a day (TID) | INTRAMUSCULAR | Status: DC | PRN

## 2024-02-22 MED ORDER — BUPIVACAINE-EPINEPHRINE (PF) 0.25% -1:200000 IJ SOLN
INTRAMUSCULAR | Status: AC
  Filled 2024-02-22: qty 30

## 2024-02-22 MED ORDER — MORPHINE SULFATE (PF) 2 MG/ML IV SOLN: 0.5000 mg | INTRAVENOUS | Status: DC | PRN

## 2024-02-22 MED ORDER — PROPOFOL 1000 MG/100ML IV EMUL
INTRAVENOUS | Status: AC
  Filled 2024-02-22: qty 100

## 2024-02-22 MED ORDER — TRANEXAMIC ACID 1000 MG/10ML IV SOLN
2000.0000 mg | INTRAVENOUS | Status: DC
  Filled 2024-02-22: qty 20

## 2024-02-22 MED ORDER — TRANEXAMIC ACID 1000 MG/10ML IV SOLN
INTRAVENOUS | Status: DC | PRN
  Administered 2024-02-22: 2000 mg via TOPICAL

## 2024-02-22 MED ORDER — STERILE WATER FOR IRRIGATION IR SOLN
Status: DC | PRN
  Administered 2024-02-22: 2000 mL

## 2024-02-22 MED ORDER — SODIUM CHLORIDE (PF) 0.9 % IJ SOLN
INTRAMUSCULAR | Status: AC
  Filled 2024-02-22: qty 30

## 2024-02-22 MED ORDER — MENTHOL 3 MG MT LOZG: 1.0000 | LOZENGE | OROMUCOSAL | Status: DC | PRN

## 2024-02-22 MED ORDER — PHENYLEPHRINE HCL-NACL 20-0.9 MG/250ML-% IV SOLN
INTRAVENOUS | Status: DC | PRN
  Administered 2024-02-22: 20 ug/min via INTRAVENOUS

## 2024-02-22 MED ORDER — PROPOFOL 500 MG/50ML IV EMUL
INTRAVENOUS | Status: DC | PRN
  Administered 2024-02-22: 100 ug/kg/min via INTRAVENOUS
  Administered 2024-02-22: 30 mg via INTRAVENOUS

## 2024-02-22 MED ORDER — ASPIRIN 81 MG PO TBEC: 81.0000 mg | DELAYED_RELEASE_TABLET | Freq: Two times a day (BID) | ORAL | 0 refills | Status: AC

## 2024-02-22 MED ORDER — HYDROCODONE-ACETAMINOPHEN 5-325 MG PO TABS
1.0000 | ORAL_TABLET | Freq: Four times a day (QID) | ORAL | 0 refills | Status: DC | PRN
Start: 2024-02-22 — End: 2024-03-21

## 2024-02-22 MED ORDER — 0.9 % SODIUM CHLORIDE (POUR BTL) OPTIME
TOPICAL | Status: DC | PRN
  Administered 2024-02-22: 1000 mL

## 2024-02-22 MED ORDER — METOCLOPRAMIDE HCL 5 MG PO TABS: 5.0000 mg | ORAL_TABLET | Freq: Three times a day (TID) | ORAL | Status: DC | PRN

## 2024-02-22 MED ORDER — FENOFIBRATE 160 MG PO TABS
160.0000 mg | ORAL_TABLET | Freq: Every day | ORAL | Status: DC
  Administered 2024-02-22 – 2024-02-23 (×2): 160 mg via ORAL
  Filled 2024-02-22 (×2): qty 1

## 2024-02-22 MED ORDER — ONDANSETRON HCL 4 MG/2ML IJ SOLN
INTRAMUSCULAR | Status: AC
  Filled 2024-02-22: qty 2

## 2024-02-22 MED ORDER — ACETAMINOPHEN 10 MG/ML IV SOLN
INTRAVENOUS | Status: DC | PRN
  Administered 2024-02-22: 1000 mg via INTRAVENOUS

## 2024-02-22 MED ORDER — CEFAZOLIN SODIUM-DEXTROSE 2-4 GM/100ML-% IV SOLN
2.0000 g | INTRAVENOUS | Status: AC
  Administered 2024-02-22: 2 g via INTRAVENOUS
  Filled 2024-02-22: qty 100

## 2024-02-22 SURGICAL SUPPLY — 42 items
BAG COUNTER SPONGE SURGICOUNT (BAG) IMPLANT
BAG DECANTER FOR FLEXI CONT (MISCELLANEOUS) ×1 IMPLANT
BLADE SAW SGTL 18X1.27X75 (BLADE) ×1 IMPLANT
BOOTIES KNEE HIGH SLOAN (MISCELLANEOUS) ×1 IMPLANT
COVER PERINEAL POST (MISCELLANEOUS) ×1 IMPLANT
COVER SURGICAL LIGHT HANDLE (MISCELLANEOUS) ×1 IMPLANT
DERMABOND ADVANCED .7 DNX12 (GAUZE/BANDAGES/DRESSINGS) IMPLANT
DRAPE FOOT SWITCH (DRAPES) ×1 IMPLANT
DRAPE IMP U-DRAPE 54X76 (DRAPES) ×1 IMPLANT
DRAPE STERI IOBAN 125X83 (DRAPES) ×1 IMPLANT
DRAPE U-SHAPE 47X51 STRL (DRAPES) ×1 IMPLANT
DRSG AQUACEL AG ADV 3.5X 6 (GAUZE/BANDAGES/DRESSINGS) ×1 IMPLANT
DURAPREP 26ML APPLICATOR (WOUND CARE) ×1 IMPLANT
ELECT PENCIL ROCKER SW 15FT (MISCELLANEOUS) ×1 IMPLANT
ELECT REM PT RETURN 15FT ADLT (MISCELLANEOUS) ×1 IMPLANT
ELIMINATOR HOLE APEX DEPUY (Hips) IMPLANT
FEMORAL STEM 12/14 TPR SZ4 HIP (Orthopedic Implant) IMPLANT
GLOVE BIO SURGEON STRL SZ8 (GLOVE) ×2 IMPLANT
GLOVE BIOGEL PI IND STRL 7.0 (GLOVE) ×1 IMPLANT
GLOVE BIOGEL PI IND STRL 8 (GLOVE) ×2 IMPLANT
GLOVE SURG SYN 7.0 PF PI (GLOVE) ×1 IMPLANT
GOWN SRG XL LVL 4 BRTHBL STRL (GOWNS) ×1 IMPLANT
GOWN STRL REUS W/ TWL XL LVL3 (GOWN DISPOSABLE) ×2 IMPLANT
HEAD FEM STD 32X+5 STRL (Hips) IMPLANT
HOLDER FOLEY CATH W/STRAP (MISCELLANEOUS) ×1 IMPLANT
KIT TURNOVER KIT A (KITS) ×1 IMPLANT
LINER ACETABULAR 32X50 (Liner) IMPLANT
LINER PINN ACET GRIP 50X100 (Liner) IMPLANT
MANIFOLD NEPTUNE II (INSTRUMENTS) ×1 IMPLANT
NDL HYPO 22X1.5 SAFETY MO (MISCELLANEOUS) ×1 IMPLANT
NEEDLE HYPO 22X1.5 SAFETY MO (MISCELLANEOUS) ×1 IMPLANT
NS IRRIG 1000ML POUR BTL (IV SOLUTION) ×1 IMPLANT
PACK ANTERIOR HIP CUSTOM (KITS) ×1 IMPLANT
RETRACTOR WND ALEXIS 18 MED (MISCELLANEOUS) ×1 IMPLANT
SPIKE FLUID TRANSFER (MISCELLANEOUS) ×1 IMPLANT
SUT ETHIBOND NAB CT1 #1 30IN (SUTURE) ×2 IMPLANT
SUT VIC AB 1 CT1 36 (SUTURE) ×1 IMPLANT
SUT VIC AB 2-0 CT1 TAPERPNT 27 (SUTURE) ×1 IMPLANT
SUT VICRYL AB 3-0 FS1 BRD 27IN (SUTURE) ×1 IMPLANT
SUTURE STRATFX 0 PDS 27 VIOLET (SUTURE) ×1 IMPLANT
TRAY FOLEY MTR SLVR 16FR STAT (SET/KITS/TRAYS/PACK) ×1 IMPLANT
YANKAUER SUCT BULB TIP NO VENT (SUCTIONS) ×1 IMPLANT

## 2024-02-22 NOTE — Transfer of Care (Signed)
 Immediate Anesthesia Transfer of Care Note  Patient: [name]  Procedure(s) Performed: ARTHROPLASTY, HIP, TOTAL, ANTERIOR APPROACH (Left: Hip)  Patient Location: PACU  Anesthesia Type:Spinal  Level of Consciousness: awake, alert , and oriented  Airway & Oxygen Therapy: Patient Spontanous Breathing and Patient connected to face mask oxygen  Post-op Assessment: Report given to RN and Post -op Vital signs reviewed and stable  Post vital signs: Initial B/P in PACU was 70/49.  Ephedrine  given, and B/P noted to be within normal limits.   Last Vitals:  Vitals Value Taken Time  BP 124/69 02/22/24 11:42  Temp    Pulse 90 02/22/24 11:44  Resp 20 02/22/24 11:44  SpO2 94 % 02/22/24 11:44  Vitals shown include unfiled device data.  Last Pain:  Vitals:   02/22/24 1135  TempSrc:   PainSc: 0-No pain         Complications: No notable events documented.

## 2024-02-22 NOTE — Anesthesia Preprocedure Evaluation (Signed)
 Anesthesia Evaluation  Patient identified by MRN, date of birth, ID band Patient awake    Reviewed: Allergy  & Precautions, NPO status , Patient's Chart, lab work & pertinent test results  History of Anesthesia Complications (+) PONV and history of anesthetic complications  Airway Mallampati: II  TM Distance: >3 FB Neck ROM: Full    Dental no notable dental hx.    Pulmonary COPD, Current Smoker and Patient abstained from smoking.   Pulmonary exam normal        Cardiovascular hypertension, + CAD   Rhythm:Regular Rate:Normal     Neuro/Psych   Anxiety     CVA, No Residual Symptoms    GI/Hepatic negative GI ROS, Neg liver ROS,,,  Endo/Other  diabetes, Type 2, Oral Hypoglycemic Agents    Renal/GU negative Renal ROS  negative genitourinary   Musculoskeletal  (+) Arthritis ,    Abdominal Normal abdominal exam  (+)   Peds  Hematology Lab Results      Component                Value               Date                      WBC                      5.0                 02/16/2024                HGB                      13.5                02/16/2024                HCT                      42.2                02/16/2024                MCV                      95.3                02/16/2024                PLT                      308                 02/16/2024              Anesthesia Other Findings   Reproductive/Obstetrics                              Anesthesia Physical Anesthesia Plan  ASA: 3  Anesthesia Plan: MAC and Spinal   Post-op Pain Management:    Induction: Intravenous  PONV Risk Score and Plan: 2 and Ondansetron , Dexamethasone  and Treatment may vary due to age or medical condition  Airway Management Planned: Simple Face Mask and Nasal Cannula  Additional Equipment: None  Intra-op Plan:   Post-operative Plan:   Informed Consent: I have reviewed the patients  History and  Physical, chart, labs and discussed the procedure including the risks, benefits and alternatives for the proposed anesthesia with the patient or authorized representative who has indicated his/her understanding and acceptance.     Dental advisory given  Plan Discussed with: CRNA  Anesthesia Plan Comments:         Anesthesia Quick Evaluation

## 2024-02-22 NOTE — Evaluation (Signed)
 Physical Therapy Evaluation Patient Details Name: [name] MRN: [id number] DOB: 09-12-1945 Today's Date: 02/22/2024  History of Present Illness  78 yo female presents to therapy s/p L THA, anterior approach on 02/22/2024 due to failure of conservative measures. Pt PMH includes but is not limited to: schamberg's purpura, RA, HTN, DM II, HLD, COPD, diverticulitis, tobacco abuse,  lumbar spine surgery, B TKA, R THA 06/20/2022.  Clinical Impression    [name] is a 78 y.o. female POD 0 s/p L THA. Patient reports IND with mobility at baseline. Patient is now limited by functional impairments (see PT problem list below) and requires S for bed mobility and CGA for transfers. Patient was able to ambulate 4 feet with RW and CGA level of assist. Pt limited due to reports of feeling hot and light headed. Patient will benefit from continued skilled PT interventions to address impairments and progress towards PLOF. Acute PT will follow to progress mobility and stair training in preparation for safe discharge home with family support and [facility] services.       If plan is discharge home, recommend the following: A little help with walking and/or transfers;A little help with bathing/dressing/bathroom;Assistance with cooking/housework;Assist for transportation;Help with stairs or ramp for entrance   Can travel by private vehicle        Equipment Recommendations Rolling walker (2 wheels)  Recommendations for Other Services       Functional Status Assessment Patient has had a recent decline in their functional status and demonstrates the ability to make significant improvements in function in a reasonable and predictable amount of time.     Precautions / Restrictions Restrictions Weight Bearing Restrictions Per Provider Order: Yes LLE Weight Bearing Per Provider Order: Weight bearing as tolerated      Mobility  Bed Mobility Overal bed mobility: Needs Assistance Bed Mobility: Supine to Sit      Supine to sit: Supervision, HOB elevated, Used rails     General bed mobility comments: min cues    Transfers Overall transfer level: Needs assistance Equipment used: Rolling walker (2 wheels) Transfers: Sit to/from Stand Sit to Stand: Contact guard assist           General transfer comment: min cues    Ambulation/Gait Ambulation/Gait assistance: Contact guard assist Gait Distance (Feet): 4 Feet Assistive device: Rolling walker (2 wheels) Gait Pattern/deviations: Step-to pattern, Antalgic, Trunk flexed, Decreased stance time - left Gait velocity: decreased     General Gait Details: slight trunk flexion with B UE support at RW to offload L LE in stance phase, min cues for safety and RW management pt reported feeling hot and light headed and guided to recliner. pt Bp 10870 (108 PR)  Stairs            Wheelchair Mobility     Tilt Bed    Modified Rankin (Stroke Patients Only)       Balance Overall balance assessment: Needs assistance Sitting-balance support: Feet supported Sitting balance-Leahy Scale: Good     Standing balance support: Bilateral upper extremity supported, During functional activity, Reliant on assistive device for balance Standing balance-Leahy Scale: Poor                               Pertinent Vitals/Pain Pain Assessment Pain Assessment: 0-10 Pain Score: 8  Pain Location: L hip and LE Pain Descriptors / Indicators: Aching, Constant, Discomfort, Dull, Grimacing, Operative site guarding Pain Intervention(s): Limited activity  within patient's tolerance, Monitored during session, Repositioned, Ice applied, Patient requesting pain meds-RN notified, Premedicated before session    Home Living Family/patient expects to be discharged to:: Private residence Living Arrangements: Children Available Help at Discharge: Family;Available 24 hours/day Type of Home: Apartment Home Access: Elevator       Home Layout: One  level Home Equipment: Agricultural consultant (2 wheels);Rollator (4 wheels);Cane - single point;Lift chair;BSC/3in1      Prior Function Prior Level of Function : Independent/Modified Independent;Driving             Mobility Comments: IND with all ADLs self care tasks and IADLs       Extremity/Trunk Assessment        Lower Extremity Assessment Lower Extremity Assessment: LLE deficits/detail LLE Deficits / Details: ankle DF/PF 5/5 LLE Sensation: WNL    Cervical / Trunk Assessment Cervical / Trunk Assessment: Back Surgery  Communication   Communication Communication: No apparent difficulties    Cognition Arousal: Alert Behavior During Therapy: WFL for tasks assessed/performed   PT - Cognitive impairments: No apparent impairments                         Following commands: Intact       Cueing       General Comments      Exercises     Assessment/Plan    PT Assessment Patient needs continued PT services  PT Problem List Decreased strength;Decreased range of motion;Decreased activity tolerance;Decreased balance;Decreased mobility;Decreased coordination;Pain       PT Treatment Interventions DME instruction;Gait training;Functional mobility training;Therapeutic activities;Therapeutic exercise;Balance training;Neuromuscular re-education;Patient/family education;Modalities    PT Goals (Current goals can be found in the Care Plan section)  Acute Rehab PT Goals Patient Stated Goal: to be able to get strong enough to move to [place] soon PT Goal Formulation: With patient Time For Goal Achievement: 03/07/24 Potential to Achieve Goals: Good    Frequency 7X/week     Co-evaluation               AM-PAC PT 6 Clicks Mobility  Outcome Measure Help needed turning from your back to your side while in a flat bed without using bedrails?: None Help needed moving from lying on your back to sitting on the side of a flat bed without using bedrails?: A  Little Help needed moving to and from a bed to a chair (including a wheelchair)?: A Little Help needed standing up from a chair using your arms (e.g., wheelchair or bedside chair)?: A Little Help needed to walk in hospital room?: A Little Help needed climbing 3-5 steps with a railing? : A Lot 6 Click Score: 18    End of Session Equipment Utilized During Treatment: Gait belt Activity Tolerance: Patient limited by pain;Other (comment) (light headedness) Patient left: in chair;with call bell/phone within reach;with chair alarm set;with nursing/sitter in room Nurse Communication: Mobility status;Other (comment) (pt physiological response) PT Visit Diagnosis: Unsteadiness on feet (R26.81);Other abnormalities of gait and mobility (R26.89);Muscle weakness (generalized) (M62.81);Difficulty in walking, not elsewhere classified (R26.2);Pain Pain - Right/Left: Left Pain - part of body: Hip;Leg    Time: 8377-8359 PT Time Calculation (min) (ACUTE ONLY): 18 min   Charges:   PT Evaluation $PT Eval Low Complexity: 1 Low   PT General Charges $$ ACUTE PT VISIT: 1 Visit         [place], PT Acute Rehab   [place] VEAR Drone 02/22/2024, 5:47 PM

## 2024-02-22 NOTE — CV Procedure (Signed)
 PRE-OP DIAGNOSIS:  LEFT HIP DEGENERATIVE JOINT DISEASE POST-OP DIAGNOSIS: same PROCEDURE:  LEFT TOTAL HIP ARTHROPLASTY ANTERIOR APPROACH ANESTHESIA:  Spinal and MAC SURGEON:  Maude Herald MD ASSISTANT:  Prentice Earl PA-C   INDICATIONS FOR PROCEDURE:  The patient is a 78 y.o. female with a long history of a painful hip.  This has persisted despite multiple conservative measures.  The patient has persisted with pain and dysfunction making rest and activity difficult.  A total hip replacement is offered as surgical treatment.  Informed operative consent was obtained after discussion of possible complications including reaction to anesthesia, infection, neurovascular injury, dislocation, DVT, PE, and death.  The importance of the postoperative rehab program to optimize result was stressed with the patient.  SUMMARY OF FINDINGS AND PROCEDURE:  Under the above anesthesia through a anterior approach an the Hana table a left THR was performed.  The patient had severe degenerative change and good bone quality.  We used DePuy components to replace the hip and these were size 4 high offset Actis femur capped with a +5 32mm metal hip ball.  On the acetabular side we used a size 50 Gription shell with a plus 0 neutral polyethylene liner.  We did use a hole eliminator.  Prentice Earl PA-C assisted throughout and was invaluable to the completion of the case in that he helped position and retract while I performed the procedure.  He also closed simultaneously to help minimize OR time.  I used fluoroscopy throughout the case to check position of components and leg lengths and read all these views myself.  DESCRIPTION OF PROCEDURE:  The patient was taken to the OR suite where the above anesthetic was applied.  The patient was then positioned on the Hana table supine.  All bony prominences were appropriately padded.  Prep and drape was then performed in normal sterile fashion.  The patient was given kefzol  preoperative  antibiotic and an appropriate time out was performed.  We then took an anterior approach to the left hip.  Dissection was taken through adipose to the tensor fascia lata fascia.  This structure was incised longitudinally and we dissected in the intermuscular interval just medial to this muscle.  Cobra retractors were placed superior and inferior to the femoral neck superficial to the capsule.  A capsular incision was then made and the retractors were placed along the femoral neck.  Xray was brought in to get a good level for the femoral neck cut which was made with an oscillating saw and osteotome.  The femoral head was removed with a corkscrew.  The acetabulum was exposed and some labral tissues were excised. Reaming was taken to the inside wall of the pelvis and sequentially up to 1 mm smaller than the actual component.  A trial of components was done and then the aforementioned acetabular shell was placed in appropriate tilt and anteversion confirmed by fluoroscopy. The liner was placed along with the hole eliminator and attention was turned to the femur.  The leg was brought down and over into adduction and the elevator bar was used to raise the femur up gently in the wound.  The piriformis was released with care taken to preserve the obturator internus attachment and all of the posterior capsule. The femur was reamed and then broached to the appropriate size.  A trial reduction was done and the aforementioned head and neck assembly gave us  the best stability in extension with external rotation.  Leg lengths were felt to be about equal  by fluoroscopic exam.  The trial components were removed and the wound irrigated.  We then placed the femoral component in appropriate anteversion.  The head was applied to a dry stem neck and the hip again reduced.  It was again stable in the aforementioned position.  The would was irrigated again followed by re-approximation of anterior capsule with ethibond suture. Tensor  fascia was repaired with V-loc suture  followed by deep closure with #O and #2 undyed vicryl.  Skin was closed with subQ stitch and steristrips followed by a sterile dressing.  EBL and IOF can be obtained from anesthesia records.  DISPOSITION:  The patient was taken to PACU to potentially go home same day depending on ability to walk and tolerate liquids.

## 2024-02-22 NOTE — Anesthesia Postprocedure Evaluation (Signed)
 Anesthesia Post Note  Patient: [name]  Procedure(s) Performed: ARTHROPLASTY, HIP, TOTAL, ANTERIOR APPROACH (Left: Hip)     Patient location during evaluation: PACU Anesthesia Type: MAC and Spinal Level of consciousness: awake and alert Pain management: pain level controlled Vital Signs Assessment: post-procedure vital signs reviewed and stable Respiratory status: spontaneous breathing, nonlabored ventilation, respiratory function stable and patient connected to nasal cannula oxygen Cardiovascular status: stable and blood pressure returned to baseline Postop Assessment: no apparent nausea or vomiting Anesthetic complications: no   No notable events documented.  Last Vitals:  Vitals:   02/22/24 1230 02/22/24 1330  BP: 107/64 104/64  Pulse: 81 78  Resp: 17 18  Temp: (!) 36.4 C   SpO2: 96% 95%    Last Pain:  Vitals:   02/22/24 1330  TempSrc:   PainSc: 0-No pain                 Cordella P Husein Guedes

## 2024-02-22 NOTE — Anesthesia Procedure Notes (Signed)
 Spinal  Patient location during procedure: OR Start time: 02/22/2024 10:01 AM End time: 02/22/2024 10:04 AM Staffing Performed: anesthesiologist  Anesthesiologist: Dorethea Cordella SQUIBB, DO Performed by: Dorethea Cordella SQUIBB, DO Authorized by: Dorethea Cordella SQUIBB, DO   Preanesthetic Checklist Completed: patient identified, IV checked, site marked, risks and benefits discussed, surgical consent, monitors and equipment checked, pre-op evaluation and timeout performed Spinal Block Patient position: sitting Prep: DuraPrep Patient monitoring: heart rate, cardiac monitor, continuous pulse ox and blood pressure Approach: midline Location: L3-4 Injection technique: single-shot Needle Needle type: Pencan  Needle gauge: 24 G Needle length: 10 cm Assessment Events: CSF return Additional Notes Patient identified. Risks/Benefits/Options discussed with patient including but not limited to bleeding, infection, nerve damage, paralysis, failed block, incomplete pain control, headache, blood pressure changes, nausea, vomiting, reactions to medications, itching and postpartum back pain. Confirmed with bedside nurse the patient's most recent platelet count. Confirmed with patient that they are not currently taking any anticoagulation, have any bleeding history or any family history of bleeding disorders. Patient expressed understanding and wished to proceed. All questions were answered. Sterile technique was used throughout the entire procedure. Please see nursing notes for vital signs. Warning signs of high block given to the patient including shortness of breath, tingling/numbness in hands, complete motor block, or any concerning symptoms with instructions to call for help. Patient was given instructions on fall risk and not to get out of bed. All questions and concerns addressed with instructions to call with any issues or inadequate analgesia.

## 2024-02-22 NOTE — Progress Notes (Signed)
 Pt working with PT and reports feeling hot and lightheaded. Pts BP is 107/60, HR is 106. Pt in chair saying symptoms are beginning to resolve. Blood sugar checked and is 197. Pt received scheduled Metformin  and Tradjenta with dinner. Prentice Earl, PA notified and verbal order given to give pt a 500 mL fluid bolus. Pt has orders to add glycemic control order if blood sugar is >140. Per provider hold on that, and he will re-assess in morning if pts blood sugar remains high.

## 2024-02-22 NOTE — Interval H&P Note (Signed)
 History and Physical Interval Note:  02/22/2024 9:08 AM  [name]  has presented today for surgery, with the diagnosis of LEFT HIP DEGENERATIVE JOINT DISEASE.  The various methods of treatment have been discussed with the patient and family. After consideration of risks, benefits and other options for treatment, the patient has consented to  Procedure(s): ARTHROPLASTY, HIP, TOTAL, ANTERIOR APPROACH (Left) as a surgical intervention.  The patient's history has been reviewed, patient examined, no change in status, stable for surgery.  I have reviewed the patient's chart and labs.  Questions were answered to the patient's satisfaction.     Lilibeth Opie G Haidee Stogsdill

## 2024-02-23 ENCOUNTER — Encounter (HOSPITAL_COMMUNITY): Payer: Self-pay | Admitting: Orthopaedic Surgery

## 2024-02-23 DIAGNOSIS — M1612 Unilateral primary osteoarthritis, left hip: Secondary | ICD-10-CM | POA: Diagnosis not present

## 2024-02-23 NOTE — Progress Notes (Signed)
 02/23/2024 1:12 PM -----------------------------------------------------------[facility]--------------------------------------------------- D(Data) A(Action) R(response)     Data: Discharge Readiness Assessment EDD today 10/220/2025    Action: Chart reviewed    Response: No immediate Barriers to discharge identified at this time.     Thaer Miyoshi, RN [employer]

## 2024-02-23 NOTE — TOC Transition Note (Signed)
 Transition of Care [facility]) - Discharge Note   Patient Details  Name: [name] MRN: [id number] Date of Birth: 1945-12-10  [facility]) CM/SW Contact:  NORMAN ASPEN, LCSW Phone Number: 02/23/2024, 9:47 AM   Clinical Narrative:     Met with pt who confirms she has needed DME in the home.  OPPT already arranged at [facility]  No further IP CM needs.  Final next level of care: OP Rehab Barriers to Discharge: No Barriers Identified   Patient Goals and CMS Choice Patient states their goals for this hospitalization and ongoing recovery are:: return home          Discharge Placement                       Discharge Plan and Services Additional resources added to the After Visit Summary for                  DME Arranged: N/A DME Agency: NA                  Social Drivers of Health (SDOH) Interventions SDOH Screenings   Food Insecurity: No Food Insecurity (02/22/2024)  Housing: Low Risk  (02/22/2024)  Transportation Needs: No Transportation Needs (02/22/2024)  Utilities: Not At Risk (02/22/2024)  Alcohol Screen: Low Risk  (09/15/2023)  Depression (PHQ2-9): Low Risk  (01/26/2024)  Financial Resource Strain: Low Risk  (01/25/2024)  Physical Activity: Inactive (01/25/2024)  Social Connections: Socially Isolated (02/22/2024)  Stress: No Stress Concern Present (01/25/2024)  Tobacco Use: High Risk (02/22/2024)  Health Literacy: Adequate Health Literacy (09/15/2023)     Readmission Risk Interventions     No data to display

## 2024-02-23 NOTE — Progress Notes (Addendum)
 Physical Therapy Treatment Patient Details Name: [name] MRN: [id number] DOB: [birth year] Today's Date: 02/23/2024   History of Present Illness 78 yo female presents to therapy s/p L THA, anterior approach on 02/22/2024 due to failure of conservative measures. Pt PMH includes but is not limited to: schamberg's purpura, RA, HTN, DM II, HLD, COPD, diverticulitis, tobacco abuse,  lumbar spine surgery, B TKA, R THA 06/20/2022.    PT Comments   Pt admitted with above diagnosis.  Pt currently with functional limitations due to the deficits listed below (see PT Problem List). Pt in recliner when PT arrived. Pt agreeable to therapy intervention. Pt repots limited sleep last night and some L LE pain. Pt able to perform sit to stand  from recliner and commode with S and min cues, maintain static standing no UE support for ADL tasks at sink- no overt LOB, gait tasks in personal room and in hallway 100 feet with CGA to close S and min cues with RW. Pt left seated in recliner and all needs in place. Pt will benefit from acute skilled PT to increase their independence and safety with mobility to allow discharge.   Bp seated in recliner at rest 106/66 (89 PR) Bp standing 107/70 (103 PR) no reports of dizziness     If plan is discharge home, recommend the following: A little help with walking and/or transfers;A little help with bathing/dressing/bathroom;Assistance with cooking/housework;Assist for transportation;Help with stairs or ramp for entrance   Can travel by private vehicle        Equipment Recommendations  Rolling walker (2 wheels)    Recommendations for Other Services       Precautions / Restrictions Precautions Precautions: Fall Restrictions Weight Bearing Restrictions Per Provider Order: Yes LLE Weight Bearing Per Provider Order: Weight bearing as tolerated     Mobility  Bed Mobility               General bed mobility comments: pt seated in recliner when PT arrived     Transfers Overall transfer level: Needs assistance Equipment used: Rolling walker (2 wheels) Transfers: Sit to/from Stand Sit to Stand: Supervision           General transfer comment: min cues    Ambulation/Gait Ambulation/Gait assistance: Contact guard assist, Supervision Gait Distance (Feet): 100 Feet Assistive device: Rolling walker (2 wheels) Gait Pattern/deviations: Step-to pattern, Antalgic, Trunk flexed, Decreased stance time - left Gait velocity: decreased     General Gait Details: slight trunk flexion with B UE support at RW to offload L LE in stance phase, min cues for safety and RW management improved gait tolerance today   Stairs             Wheelchair Mobility     Tilt Bed    Modified Rankin (Stroke Patients Only)       Balance Overall balance assessment: Needs assistance Sitting-balance support: Feet supported Sitting balance-Leahy Scale: Good     Standing balance support: Bilateral upper extremity supported, During functional activity, Reliant on assistive device for balance Standing balance-Leahy Scale: Fair Standing balance comment: static standing no UE support                            Communication Communication Communication: No apparent difficulties  Cognition Arousal: Alert Behavior During Therapy: WFL for tasks assessed/performed   PT - Cognitive impairments: No apparent impairments  Following commands: Intact      Cueing    Exercises Total Joint Exercises Ankle Circles/Pumps: AROM, Both, 10 reps Quad Sets: AROM, Left, 5 reps Heel Slides: AROM, Left, 5 reps Hip ABduction/ADduction: AROM, Left, 5 reps, Standing Long Arc Quad: AROM, Left, 5 reps, Seated Knee Flexion: AROM, Left, 5 reps, Standing Standing Hip Extension: AROM, Left, 5 reps, Standing    General Comments        Pertinent Vitals/Pain Pain Assessment Pain Assessment: 0-10 Pain Score: 5  Pain Location: L  hip and LE Pain Descriptors / Indicators: Aching, Constant, Discomfort, Dull, Grimacing, Operative site guarding Pain Intervention(s): Limited activity within patient's tolerance, Monitored during session, Premedicated before session, Repositioned, Ice applied    Home Living                          Prior Function            PT Goals (current goals can now be found in the care plan section) Acute Rehab PT Goals Patient Stated Goal: to be able to get strong enough to move to [place] soon PT Goal Formulation: With patient Time For Goal Achievement: 03/07/24 Potential to Achieve Goals: Good Progress towards PT goals: Progressing toward goals    Frequency    7X/week      PT Plan      Co-evaluation              AM-PAC PT 6 Clicks Mobility   Outcome Measure  Help needed turning from your back to your side while in a flat bed without using bedrails?: None Help needed moving from lying on your back to sitting on the side of a flat bed without using bedrails?: None Help needed moving to and from a bed to a chair (including a wheelchair)?: None Help needed standing up from a chair using your arms (e.g., wheelchair or bedside chair)?: A Little Help needed to walk in hospital room?: A Little Help needed climbing 3-5 steps with a railing? : A Lot 6 Click Score: 20    End of Session Equipment Utilized During Treatment: Gait belt Activity Tolerance: Patient tolerated treatment well Patient left: in chair;with call bell/phone within reach;with chair alarm set Nurse Communication: Mobility status;Other (comment) (pt readiness for d/c from PT standpoint) PT Visit Diagnosis: Unsteadiness on feet (R26.81);Other abnormalities of gait and mobility (R26.89);Muscle weakness (generalized) (M62.81);Difficulty in walking, not elsewhere classified (R26.2);Pain Pain - Right/Left: Left Pain - part of body: Hip;Leg     Time: 8982-8943 PT Time Calculation (min) (ACUTE ONLY):  39 min  Charges:    $Gait Training: 8-22 mins $Therapeutic Exercise: 8-22 mins $Therapeutic Activity: 8-22 mins PT General Charges $$ ACUTE PT VISIT: 1 Visit                     [place], PT Acute Rehab    [place] VEAR Drone 02/23/2024, 1:06 PM

## 2024-02-23 NOTE — Plan of Care (Signed)
  Problem: Pain Management: Goal: Pain level will decrease with appropriate interventions Outcome: Progressing   Problem: Clinical Measurements: Goal: Postoperative complications will be avoided or minimized Outcome: Progressing   Problem: Education: Goal: Knowledge of the prescribed therapeutic regimen will improve Outcome: Progressing   Problem: Safety: Goal: Ability to remain free from injury will improve Outcome: Progressing

## 2024-02-23 NOTE — Care Management Obs Status (Signed)
 MEDICARE OBSERVATION STATUS NOTIFICATION   Patient Details  Name: DEYRA PERDOMO MRN: [id number] Date of Birth: 07-19-1945   Medicare Observation Status Notification Given:  Chaney NORMAN ASPEN, LCSW 02/23/2024, 11:19 AM

## 2024-02-23 NOTE — Progress Notes (Signed)
 Subjective: 1 Day Post-Op Procedure(s) (LRB): ARTHROPLASTY, HIP, TOTAL, ANTERIOR APPROACH (Left)  Patient doing well. She feels better this morning. She is hoping to go home today.  Activity level:  wbat Diet tolerance:  ok Voiding:  ok Patient reports pain as mild.    Objective: Vital signs in last 24 hours: Temp:  [97.5 F (36.4 C)-97.9 F (36.6 C)] 97.8 F (36.6 C) (10/22 0625) Pulse Rate:  [70-103] 85 (10/22 0625) Resp:  [12-19] 16 (10/22 0625) BP: (77-127)/(39-84) 98/61 (10/22 0625) SpO2:  [92 %-100 %] 97 % (10/22 0625) Weight:  [86.2 kg] 86.2 kg (10/21 0751)  Labs: No results for input(s): HGB in the last 72 hours. No results for input(s): WBC, RBC, HCT, PLT in the last 72 hours. No results for input(s): NA, K, CL, CO2, BUN, CREATININE, GLUCOSE, CALCIUM in the last 72 hours. No results for input(s): LABPT, INR in the last 72 hours.  Physical Exam:  Neurologically intact ABD soft Neurovascular intact Sensation intact distally Intact pulses distally Dorsiflexion/Plantar flexion intact Incision: dressing C/D/I and no drainage No cellulitis present Compartment soft  Assessment/Plan:  1 Day Post-Op Procedure(s) (LRB): ARTHROPLASTY, HIP, TOTAL, ANTERIOR APPROACH (Left) Advance diet Up with therapy D/C IV fluids D/C home today if cleared by PT and doing well. Follow up in office 2 weeks post op.   [name] 02/23/2024, 7:40 AM

## 2024-02-23 NOTE — Discharge Summary (Signed)
 Patient ID: RENESSA WELLNITZ MRN: [id number] DOB/AGE: 1945/12/30 78 y.o.  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   Diverticulitis    Family history of colonic polyps 05/16/2020   History of blood transfusion    Hyperlipidemia    Hypertension    Insomnia    Internal hemorrhoids 05/16/2020   Nasal polyps    Obesity    PONV (postoperative nausea and vomiting)    Radial styloid tenosynovitis of left hand 12/21/2019   Rectal bleeding 05/16/2020   Sinusitis, chronic 03/18/2010   Qualifier: Diagnosis of  By: Eben MD, Reyes     Stroke [facility])     Surgeries: Procedure(s): ARTHROPLASTY, HIP, TOTAL, ANTERIOR APPROACH on 02/22/2024   Consultants:   Discharged Condition: Improved  Hospital Course: TERISSA HAFFEY is an 78 y.o. female who was admitted 02/22/2024 for operative treatment ofPrimary localized osteoarthritis of left hip. Patient has severe unremitting pain that affects sleep, daily activities, and work/hobbies. After pre-op clearance the patient was taken to the operating room on 02/22/2024 and underwent  Procedure(s): ARTHROPLASTY, HIP, TOTAL, ANTERIOR APPROACH.    Patient was given perioperative antibiotics:  Anti-infectives (From admission, onward)    Start     Dose/Rate Route Frequency Ordered Stop   02/22/24 1630  ceFAZolin  (ANCEF ) IVPB 2g/100 mL premix        2 g 200 mL/hr over 30 Minutes Intravenous Every 6 hours 02/22/24 1530 02/22/24 2300   02/22/24 0745  ceFAZolin   (ANCEF ) IVPB 2g/100 mL premix        2 g 200 mL/hr over 30 Minutes Intravenous On call to O.R. 02/22/24 0732 02/22/24 1016        Patient was given sequential compression devices, early ambulation, and chemoprophylaxis to prevent DVT.  Patient benefited maximally from hospital stay and there were no complications.    Recent vital signs: Patient Vitals for the past 24 hrs:  BP Temp Temp src Pulse Resp SpO2 Height Weight  02/23/24 0625 98/61 97.8 F (36.6 C) Oral 85 16 97 % -- --  02/23/24 0144 (!) 103/57 97.9 F (36.6 C) Oral 90 16 97 % -- --  02/22/24 2107 99/60 97.6 F (36.4 C) Oral 100 16 97 % -- --  02/22/24 1838 107/80 -- -- 100 14 96 % -- --  02/22/24 1810 (!) 111/59 (!) 97.5 F (36.4 C) Oral (!) 103 15 97 % -- --  02/22/24 1530 108/69 (!) 97.5 F (36.4 C) Oral 100 16 95 % -- --  02/22/24 1430 104/68 -- -- (!) 101 19 100 % -- --  02/22/24 1330 104/64 -- -- 78 18 95 % -- --  02/22/24 1230 107/64 (!) 97.5 F (36.4 C) -- 81 17 96 % -- --  02/22/24 1215 104/63 -- -- 76 19 95 % -- --  02/22/24 1200 (!) 95/50 -- -- 79 17 96 % -- --  02/22/24 1145 115/66 -- -- 86 12 94 % -- --  02/22/24 1135 (!) 77/39 -- -- 91 18 92 % -- --  02/22/24 0751 -- -- -- -- -- -- 5' 4 (1.626 m) 86.2 kg  02/22/24 0745 127/84 97.8 F (36.6 C) Oral 70 18 98 % -- --     Recent laboratory studies: No results for input(s): WBC, HGB, HCT, PLT, NA, K, CL, CO2, BUN, CREATININE, GLUCOSE, INR, CALCIUM in the last 72 hours.  Invalid input(s): PT, 2   Discharge Medications:   Allergies as of 02/23/2024       Reactions   Lipitor [atorvastatin Calcium]    Myalgia        Medication List     STOP taking these medications    methotrexate  2.5 MG tablet Commonly known as: RHEUMATREX       TAKE these medications    acetaminophen  500 MG tablet Commonly known as: TYLENOL  Take 500 mg by mouth in the morning and at bedtime.   aspirin  EC 81 MG tablet Take 1 tablet (81  mg total) by mouth 2 (two) times daily after a meal. For 2 weeks then back to once a day after that for DVT prevention. What changed:  when to take this additional instructions   cholecalciferol 25 MCG (1000 UNIT) tablet Commonly known as: VITAMIN D3 Take 1,000 Units by mouth in the morning.   fenofibrate  160 MG tablet Take 1 tablet (160 mg total) by mouth daily.   Fish Oil 1000 MG Caps Take 1,000 mg by mouth 2 (two) times daily.   folic acid  1 MG tablet Commonly known as: FOLVITE  TAKE 2 TABLETS BY MOUTH EVERY DAY   HYDROcodone -acetaminophen  5-325 MG tablet Commonly known as: NORCO/VICODIN Take 1-2 tablets by mouth every 6 (six) hours as needed for moderate pain (pain score 4-6) or severe pain (pain score 7-10) (post op pain).   metFORMIN  500 MG tablet Commonly known as: GLUCOPHAGE  Take 1 tablet (500 mg total) by mouth 2 (two) times daily.   pravastatin  20 MG tablet Commonly known as: PRAVACHOL  Take 1 tablet (20 mg total) by mouth daily.   sitaGLIPtin  50 MG tablet Commonly known as: Januvia  Take 1 tablet (50 mg total) by mouth daily.   tiZANidine  4 MG tablet Commonly known as: Zanaflex  Take 1 tablet (4 mg total) by mouth every 6 (six) hours as needed for muscle spasms.   Voltaren 1 % Gel Generic drug: diclofenac Sodium Apply 1 Application topically 4 (four) times daily as needed (pain.).   zolpidem  5 MG tablet Commonly known as: AMBIEN  Take 1 tablet (5 mg total) by mouth at bedtime as needed for sleep. What changed: how much to take               Durable Medical Equipment  (From admission, onward)           Start     Ordered   02/22/24 1531  DME Walker rolling  Once       Question:  Patient needs a walker to treat with the following condition  Answer:  Primary osteoarthritis of left hip   02/22/24 1530   02/22/24 1531  DME 3 n 1  Once        02/22/24 1530   02/22/24 1531  DME Bedside commode  Once       Question:  Patient needs a bedside commode  to treat with the following condition  Answer:  Primary osteoarthritis of left hip   02/22/24 1530  Diagnostic Studies: DG HIP UNILAT WITH PELVIS 1V LEFT Result Date: 02/22/2024 CLINICAL DATA:  Elective surgery. EXAM: DG HIP (WITH OR WITHOUT PELVIS) 1V*L* COMPARISON:  None Available. FINDINGS: Four fluoroscopic spot views of the pelvis and left hip obtained in the operating room. Images during hip arthroplasty. Fluoroscopy time 15 seconds. Dose 2.75 mGy. Previous right hip arthroplasty. IMPRESSION: Intraoperative fluoroscopy during left hip arthroplasty. Electronically Signed   By: Andrea Gasman M.D.   On: 02/22/2024 13:43   DG C-Arm 1-60 Min-No Report Result Date: 02/22/2024 Fluoroscopy was utilized by the requesting physician.  No radiographic interpretation.   DG C-Arm 1-60 Min-No Report Result Date: 02/22/2024 Fluoroscopy was utilized by the requesting physician.  No radiographic interpretation.   DG Chest 2 View Result Date: 02/16/2024 CLINICAL DATA:  Preop chest exam, upcoming left hip replacement. EXAM: CHEST - 2 VIEW COMPARISON:  03/27/2015 FINDINGS: The cardiomediastinal contours are normal. The lungs are clear. Pulmonary vasculature is normal. No consolidation, pleural effusion, or pneumothorax. Thoracic spondylosis. No acute osseous abnormalities are seen. IMPRESSION: No active cardiopulmonary disease. Electronically Signed   By: Andrea Gasman M.D.   On: 02/16/2024 17:00    Disposition: Discharge disposition: 01-Home or Self Care       Discharge Instructions     Call MD / Call 911   Complete by: As directed    If you experience chest pain or shortness of breath, CALL 911 and be transported to the hospital emergency room.  If you develope a fever above 101 F, pus (white drainage) or increased drainage or redness at the wound, or calf pain, call your surgeon's office.   Constipation Prevention   Complete by: As directed    Drink plenty of fluids.   Prune juice may be helpful.  You may use a stool softener, such as Colace (over the counter) 100 mg twice a day.  Use MiraLax (over the counter) for constipation as needed.   Diet - low sodium heart healthy   Complete by: As directed    Discharge instructions   Complete by: As directed    INSTRUCTIONS AFTER JOINT REPLACEMENT   Remove items at home which could result in a fall. This includes throw rugs or furniture in walking pathways ICE to the affected joint every three hours while awake for 30 minutes at a time, for at least the first 3-5 days, and then as needed for pain and swelling.  Continue to use ice for pain and swelling. You may notice swelling that will progress down to the foot and ankle.  This is normal after surgery.  Elevate your leg when you are not up walking on it.   Continue to use the breathing machine you got in the hospital (incentive spirometer) which will help keep your temperature down.  It is common for your temperature to cycle up and down following surgery, especially at night when you are not up moving around and exerting yourself.  The breathing machine keeps your lungs expanded and your temperature down.   DIET:  As you were doing prior to hospitalization, we recommend a well-balanced diet.  DRESSING / WOUND CARE / SHOWERING  You may shower 3 days after surgery, but keep the wounds dry during showering.  You may use an occlusive plastic wrap (Press'n Seal for example), NO SOAKING/SUBMERGING IN THE BATHTUB.  If the bandage gets wet, change with a clean dry gauze.  If the incision gets wet, pat the wound dry with a clean towel.  ACTIVITY  Increase activity slowly as tolerated, but follow the weight bearing instructions below.   No driving for 6 weeks or until further direction given by your physician.  You cannot drive while taking narcotics.  No lifting or carrying greater than 10 lbs. until further directed by your surgeon. Avoid periods of inactivity such as  sitting longer than an hour when not asleep. This helps prevent blood clots.  You may return to work once you are authorized by your doctor.     WEIGHT BEARING   Weight bearing as tolerated with assist device (walker, cane, etc) as directed, use it as long as suggested by your surgeon or therapist, typically at least 4-6 weeks.   EXERCISES  Results after joint replacement surgery are often greatly improved when you follow the exercise, range of motion and muscle strengthening exercises prescribed by your doctor. Safety measures are also important to protect the joint from further injury. Any time any of these exercises cause you to have increased pain or swelling, decrease what you are doing until you are comfortable again and then slowly increase them. If you have problems or questions, call your caregiver or physical therapist for advice.   Rehabilitation is important following a joint replacement. After just a few days of immobilization, the muscles of the leg can become weakened and shrink (atrophy).  These exercises are designed to build up the tone and strength of the thigh and leg muscles and to improve motion. Often times heat used for twenty to thirty minutes before working out will loosen up your tissues and help with improving the range of motion but do not use heat for the first two weeks following surgery (sometimes heat can increase post-operative swelling).   These exercises can be done on a training (exercise) mat, on the floor, on a table or on a bed. Use whatever works the best and is most comfortable for you.    Use music or television while you are exercising so that the exercises are a pleasant break in your day. This will make your life better with the exercises acting as a break in your routine that you can look forward to.   Perform all exercises about fifteen times, three times per day or as directed.  You should exercise both the operative leg and the other leg as  well.  Exercises include:   Quad Sets - Tighten up the muscle on the front of the thigh (Quad) and hold for 5-10 seconds.   Straight Leg Raises - With your knee straight (if you were given a brace, keep it on), lift the leg to 60 degrees, hold for 3 seconds, and slowly lower the leg.  Perform this exercise against resistance later as your leg gets stronger.  Leg Slides: Lying on your back, slowly slide your foot toward your buttocks, bending your knee up off the floor (only go as far as is comfortable). Then slowly slide your foot back down until your leg is flat on the floor again.  Angel Wings: Lying on your back spread your legs to the side as far apart as you can without causing discomfort.  Hamstring Strength:  Lying on your back, push your heel against the floor with your leg straight by tightening up the muscles of your buttocks.  Repeat, but this time bend your knee to a comfortable angle, and push your heel against the floor.  You may put a pillow under the heel to make it more comfortable if necessary.  A rehabilitation program following joint replacement surgery can speed recovery and prevent re-injury in the future due to weakened muscles. Contact your doctor or a physical therapist for more information on knee rehabilitation.    CONSTIPATION  Constipation is defined medically as fewer than three stools per week and severe constipation as less than one stool per week.  Even if you have a regular bowel pattern at home, your normal regimen is likely to be disrupted due to multiple reasons following surgery.  Combination of anesthesia, postoperative narcotics, change in appetite and fluid intake all can affect your bowels.   YOU MUST use at least one of the following options; they are listed in order of increasing strength to get the job done.  They are all available over the counter, and you may need to use some, POSSIBLY even all of these options:    Drink plenty of fluids (prune juice  may be helpful) and high fiber foods Colace 100 mg by mouth twice a day  Senokot for constipation as directed and as needed Dulcolax (bisacodyl), take with full glass of water  Miralax (polyethylene glycol) once or twice a day as needed.  If you have tried all these things and are unable to have a bowel movement in the first 3-4 days after surgery call either your surgeon or your primary doctor.    If you experience loose stools or diarrhea, hold the medications until you stool forms back up.  If your symptoms do not get better within 1 week or if they get worse, check with your doctor.  If you experience the worst abdominal pain ever or develop nausea or vomiting, please contact the office immediately for further recommendations for treatment.   ITCHING:  If you experience itching with your medications, try taking only a single pain pill, or even half a pain pill at a time.  You can also use Benadryl  over the counter for itching or also to help with sleep.   TED HOSE STOCKINGS:  Use stockings on both legs until for at least 2 weeks or as directed by physician office. They may be removed at night for sleeping.  MEDICATIONS:  See your medication summary on the After Visit Summary that nursing will review with you.  You may have some home medications which will be placed on hold until you complete the course of blood thinner medication.  It is important for you to complete the blood thinner medication as prescribed.  PRECAUTIONS:  If you experience chest pain or shortness of breath - call 911 immediately for transfer to the hospital emergency department.   If you develop a fever greater that 101 F, purulent drainage from wound, increased redness or drainage from wound, foul odor from the wound/dressing, or calf pain - CONTACT YOUR SURGEON.                                                   FOLLOW-UP APPOINTMENTS:  If you do not already have a post-op appointment, please call the office for an  appointment to be seen by your surgeon.  Guidelines for how soon to be seen are listed in your After Visit Summary, but are typically between 1-4 weeks after surgery.  OTHER INSTRUCTIONS:   Knee Replacement:  Do not place pillow under knee, focus on keeping the knee straight while resting.  CPM instructions: 0-90 degrees, 2 hours in the morning, 2 hours in the afternoon, and 2 hours in the evening. Place foam block, curve side up under heel at all times except when in CPM or when walking.  DO NOT modify, tear, cut, or change the foam block in any way.  POST-OPERATIVE OPIOID TAPER INSTRUCTIONS: It is important to wean off of your opioid medication as soon as possible. If you do not need pain medication after your surgery it is ok to stop day one. Opioids include: Codeine, Hydrocodone (Norco, Vicodin), Oxycodone (Percocet, oxycontin ) and hydromorphone  amongst others.  Long term and even short term use of opiods can cause: Increased pain response Dependence Constipation Depression Respiratory depression And more.  Withdrawal symptoms can include Flu like symptoms Nausea, vomiting And more Techniques to manage these symptoms Hydrate well Eat regular healthy meals Stay active Use relaxation techniques(deep breathing, meditating, yoga) Do Not substitute Alcohol to help with tapering If you have been on opioids for less than two weeks and do not have pain than it is ok to stop all together.  Plan to wean off of opioids This plan should start within one week post op of your joint replacement. Maintain the same interval or time between taking each dose and first decrease the dose.  Cut the total daily intake of opioids by one tablet each day Next start to increase the time between doses. The last dose that should be eliminated is the evening dose.     MAKE SURE YOU:  Understand these instructions.  Get help right away if you are not doing well or get worse.    Thank you for letting us   be a part of your medical care team.  It is a privilege we respect greatly.  We hope these instructions will help you stay on track for a fast and full recovery!   Increase activity slowly as tolerated   Complete by: As directed    Post-operative opioid taper instructions:   Complete by: As directed    POST-OPERATIVE OPIOID TAPER INSTRUCTIONS: It is important to wean off of your opioid medication as soon as possible. If you do not need pain medication after your surgery it is ok to stop day one. Opioids include: Codeine, Hydrocodone (Norco, Vicodin), Oxycodone (Percocet, oxycontin ) and hydromorphone  amongst others.  Long term and even short term use of opiods can cause: Increased pain response Dependence Constipation Depression Respiratory depression And more.  Withdrawal symptoms can include Flu like symptoms Nausea, vomiting And more Techniques to manage these symptoms Hydrate well Eat regular healthy meals Stay active Use relaxation techniques(deep breathing, meditating, yoga) Do Not substitute Alcohol to help with tapering If you have been on opioids for less than two weeks and do not have pain than it is ok to stop all together.  Plan to wean off of opioids This plan should start within one week post op of your joint replacement. Maintain the same interval or time between taking each dose and first decrease the dose.  Cut the total daily intake of opioids by one tablet each day Next start to increase the time between doses. The last dose that should be eliminated is the evening dose.           Follow-up Information     Sheril Coy, MD. Go on 03/06/2024.   Specialty: Orthopedic Surgery Why: your appointment is scheduled for 9:15. Contact information: [address] [place] [address] [phone]         [facility]  Go to.   Why: your outpatient physical therapy is scheduled. they will call you with a time Contact  information: Physical Therapy [address] [place] [address] [phone]                  Signed: Prentice Mt Jaida Basurto 02/23/2024, 7:42 AM

## 2024-02-25 DIAGNOSIS — R2689 Other abnormalities of gait and mobility: Secondary | ICD-10-CM | POA: Diagnosis not present

## 2024-02-25 DIAGNOSIS — M25552 Pain in left hip: Secondary | ICD-10-CM | POA: Diagnosis not present

## 2024-02-25 DIAGNOSIS — Z96642 Presence of left artificial hip joint: Secondary | ICD-10-CM | POA: Diagnosis not present

## 2024-02-29 DIAGNOSIS — Z96642 Presence of left artificial hip joint: Secondary | ICD-10-CM | POA: Diagnosis not present

## 2024-02-29 DIAGNOSIS — R2689 Other abnormalities of gait and mobility: Secondary | ICD-10-CM | POA: Diagnosis not present

## 2024-02-29 DIAGNOSIS — M25552 Pain in left hip: Secondary | ICD-10-CM | POA: Diagnosis not present

## 2024-03-03 ENCOUNTER — Other Ambulatory Visit (HOSPITAL_COMMUNITY): Payer: Self-pay | Admitting: Orthopaedic Surgery

## 2024-03-03 ENCOUNTER — Ambulatory Visit (HOSPITAL_COMMUNITY)
Admission: RE | Admit: 2024-03-03 | Discharge: 2024-03-03 | Disposition: A | Discharge status: Home / Self Care | Source: Ambulatory Visit | Attending: Vascular Surgery | Admitting: Vascular Surgery

## 2024-03-03 DIAGNOSIS — Z96642 Presence of left artificial hip joint: Secondary | ICD-10-CM | POA: Diagnosis not present

## 2024-03-03 DIAGNOSIS — M79605 Pain in left leg: Secondary | ICD-10-CM | POA: Diagnosis not present

## 2024-03-03 DIAGNOSIS — M7989 Other specified soft tissue disorders: Secondary | ICD-10-CM | POA: Diagnosis not present

## 2024-03-07 DIAGNOSIS — M25552 Pain in left hip: Secondary | ICD-10-CM | POA: Diagnosis not present

## 2024-03-07 DIAGNOSIS — Z96642 Presence of left artificial hip joint: Secondary | ICD-10-CM | POA: Diagnosis not present

## 2024-03-07 DIAGNOSIS — R2689 Other abnormalities of gait and mobility: Secondary | ICD-10-CM | POA: Diagnosis not present

## 2024-03-10 DIAGNOSIS — M25552 Pain in left hip: Secondary | ICD-10-CM | POA: Diagnosis not present

## 2024-03-10 DIAGNOSIS — Z96642 Presence of left artificial hip joint: Secondary | ICD-10-CM | POA: Diagnosis not present

## 2024-03-10 DIAGNOSIS — R2689 Other abnormalities of gait and mobility: Secondary | ICD-10-CM | POA: Diagnosis not present

## 2024-03-14 DIAGNOSIS — Z96642 Presence of left artificial hip joint: Secondary | ICD-10-CM | POA: Diagnosis not present

## 2024-03-14 DIAGNOSIS — R2689 Other abnormalities of gait and mobility: Secondary | ICD-10-CM | POA: Diagnosis not present

## 2024-03-14 DIAGNOSIS — M25552 Pain in left hip: Secondary | ICD-10-CM | POA: Diagnosis not present

## 2024-03-17 DIAGNOSIS — Z96642 Presence of left artificial hip joint: Secondary | ICD-10-CM | POA: Diagnosis not present

## 2024-03-17 DIAGNOSIS — M25552 Pain in left hip: Secondary | ICD-10-CM | POA: Diagnosis not present

## 2024-03-17 DIAGNOSIS — R2689 Other abnormalities of gait and mobility: Secondary | ICD-10-CM | POA: Diagnosis not present

## 2024-03-21 ENCOUNTER — Encounter: Payer: Self-pay | Admitting: Family Medicine

## 2024-03-21 ENCOUNTER — Ambulatory Visit: Admitting: Family Medicine

## 2024-03-21 VITALS — BP 98/56 | HR 81 | Temp 97.9°F | Wt 191.4 lb

## 2024-03-21 DIAGNOSIS — I10 Essential (primary) hypertension: Secondary | ICD-10-CM

## 2024-03-21 DIAGNOSIS — M25552 Pain in left hip: Secondary | ICD-10-CM | POA: Diagnosis not present

## 2024-03-21 DIAGNOSIS — Z1231 Encounter for screening mammogram for malignant neoplasm of breast: Secondary | ICD-10-CM

## 2024-03-21 DIAGNOSIS — Z6834 Body mass index (BMI) 34.0-34.9, adult: Secondary | ICD-10-CM | POA: Diagnosis not present

## 2024-03-21 DIAGNOSIS — M8589 Other specified disorders of bone density and structure, multiple sites: Secondary | ICD-10-CM

## 2024-03-21 DIAGNOSIS — E785 Hyperlipidemia, unspecified: Secondary | ICD-10-CM | POA: Diagnosis not present

## 2024-03-21 DIAGNOSIS — E559 Vitamin D deficiency, unspecified: Secondary | ICD-10-CM | POA: Diagnosis not present

## 2024-03-21 DIAGNOSIS — Z7984 Long term (current) use of oral hypoglycemic drugs: Secondary | ICD-10-CM | POA: Diagnosis not present

## 2024-03-21 DIAGNOSIS — E1169 Type 2 diabetes mellitus with other specified complication: Secondary | ICD-10-CM | POA: Diagnosis not present

## 2024-03-21 DIAGNOSIS — G479 Sleep disorder, unspecified: Secondary | ICD-10-CM | POA: Diagnosis not present

## 2024-03-21 DIAGNOSIS — R2689 Other abnormalities of gait and mobility: Secondary | ICD-10-CM | POA: Diagnosis not present

## 2024-03-21 DIAGNOSIS — Z96642 Presence of left artificial hip joint: Secondary | ICD-10-CM | POA: Diagnosis not present

## 2024-03-21 LAB — POCT GLYCOSYLATED HEMOGLOBIN (HGB A1C)
HbA1c POC (<> result, manual entry): 5.6 % (ref 4.0–5.6)
HbA1c, POC (controlled diabetic range): 5.6 % (ref 0.0–7.0)
HbA1c, POC (prediabetic range): 5.6 % — AB (ref 5.7–6.4)
Hemoglobin A1C: 5.6 % (ref 4.0–5.6)

## 2024-03-21 MED ORDER — ZOLPIDEM TARTRATE 5 MG PO TABS: 5.0000 mg | ORAL_TABLET | Freq: Every evening | ORAL | 1 refills | Status: AC | PRN

## 2024-03-21 MED ORDER — SITAGLIPTIN PHOSPHATE 50 MG PO TABS: 50.0000 mg | ORAL_TABLET | Freq: Every day | ORAL | 1 refills | Status: AC

## 2024-03-21 MED ORDER — METFORMIN HCL 500 MG PO TABS: 500.0000 mg | ORAL_TABLET | Freq: Two times a day (BID) | ORAL | 1 refills | Status: AC

## 2024-03-21 MED ORDER — PRAVASTATIN SODIUM 20 MG PO TABS: 20.0000 mg | ORAL_TABLET | Freq: Every day | ORAL | 2 refills | Status: AC

## 2024-03-21 MED ORDER — FENOFIBRATE 160 MG PO TABS: 160.0000 mg | ORAL_TABLET | Freq: Every day | ORAL | 3 refills | Status: AC

## 2024-03-21 NOTE — Progress Notes (Signed)
 Patient ID: [name], female  DOB: Jan 07, 1946, 78 y.o.   MRN: [id number] Patient Care Team    Relationship Specialty Notifications Start End  Catherine Charlies LABOR, DO PCP - General Family Medicine  10/10/19 06/17/24   Reason for change: Patient Moved   Change requested by: Member   Comment: Patient moving back to [place]  Deveshwar, [place], MD Consulting Physician Rheumatology  10/10/19   Kristie Lamprey, MD Consulting Physician Gastroenterology  10/10/19   Jackalyn Debby Faden, OD  Optometry  10/10/19   Jackalyn Debby Faden, OD  Optometry  09/23/22     Chief Complaint  Patient presents with   Diabetes    [name] is a 78 y.o.  female present for chronic condition management All past medical history, surgical history, allergies, family history, immunizations, medications and social history were updated in the electronic medical record today. All recent labs, ED visits and hospitalizations within the last year were reviewed.  diabetes (HCC)/obesity/statin intolerance/hyperlipidemia Pt reports compliance with pravastatin  and fenofibrate .  Blood pressures ranges at home not routinely checked.  Patient denies chest pain, shortness of breath, dizziness or lower extremity edema.    Pt takes a daily baby ASA.       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  Patient is prescribed methotrexate  and folate.   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   She was diagnosed with diabetes when she was around 52.   Patient denies dizziness, hyperglycemic or hypoglycemic events. Patient denies numbness, tingling in the extremities or nonhealing wounds of feet.   vitamin D  deficiency She reports she continues to supplement vitamin D  2000 units daily.   Sleep  disturbance Patient is prescribed Ambien  5 mg nightly still as needed. She very rarely takes this medication.  When she does she takes the medication she takes one third of a tab.       03/21/2024    9:35 AM 01/26/2024   11:13 AM 09/15/2023   10:57 AM 09/09/2022   10:39 AM 04/15/2022    1:06 PM  Depression screen PHQ 2/9  Decreased Interest 0 0 3 0 0  Down, Depressed, Hopeless 0 0 0 0 0  PHQ - 2 Score 0 0 3 0 0  Altered sleeping 0 1 2    Tired, decreased energy 1 1 2     Change in appetite 0 0 1    Feeling bad or failure about yourself  0 0 0    Trouble concentrating 0 0 1    Moving slowly or fidgety/restless 0 0 0    Suicidal thoughts 0 0 0    PHQ-9 Score 1 2  9      Difficult doing work/chores Somewhat difficult Not difficult at all Not difficult at all       Data saved with a previous flowsheet row definition      03/21/2024    9:36 AM 01/26/2024   11:13 AM  GAD 7 : Generalized Anxiety Score  Nervous, Anxious, on Edge 0 0  Control/stop worrying 0 0  Worry too much - different things 0 1  Trouble relaxing 0 0  Restless 0 0  Easily annoyed or irritable 0 0  Afraid - awful might happen 0 0  Total GAD 7 Score 0 1  Anxiety Difficulty Not difficult at  all Not difficult at all          03/21/2024    9:35 AM 01/26/2024   11:13 AM 09/15/2023   10:55 AM 09/14/2023    4:12 PM 09/09/2022   10:42 AM  Fall Risk   Falls in the past year? 0 0 0 0 1  Number falls in past yr: 0 0 0 0 1  Injury with Fall? 0 0 0 0 0  Risk for fall due to : No Fall Risks    Impaired vision  Follow up Falls evaluation completed Falls evaluation completed Falls evaluation completed;Education provided;Falls prevention discussed  Falls prevention discussed     Immunization History  Administered Date(s) Administered   Fluad Quad(high Dose 65+) 01/11/2019, 02/15/2020, 03/06/2022   Fluad Trivalent(High Dose 65+) 03/17/2023   INFLUENZA, HIGH DOSE SEASONAL PF 02/20/2013, 02/07/2018, 01/30/2021, 01/26/2024    Influenza Whole 04/21/2010   Influenza,inj,Quad PF,6+ Mos 02/23/2017   Influenza-Unspecified 02/15/2014, 01/30/2016, 02/07/2018, 01/11/2019   PFIZER Comirnaty(Gray Top)Covid-19 Tri-Sucrose Vaccine 10/09/2020   PFIZER(Purple Top)SARS-COV-2 Vaccination 06/08/2019, 07/03/2019, 01/04/2020   PNEUMOCOCCAL CONJUGATE-20 05/13/2021   Pfizer Covid-19 Vaccine Bivalent Booster 22yrs & up 03/24/2021   Pfizer(Comirnaty)Fall Seasonal Vaccine 12 years and older 03/27/2022   Pneumococcal Conjugate-13 02/20/2013   Pneumococcal Polysaccharide-23 04/05/2006   Pneumococcal-Unspecified 03/05/2015   Tdap 09/14/2001, 03/05/2015   Tetanus 03/27/2014   Zoster Recombinant(Shingrix) 10/12/2017, 02/07/2018   Zoster, Live 12/07/2013    No results found.  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   Diverticulitis    Family history of colonic polyps 05/16/2020   History of blood transfusion    Hyperlipidemia    Hypertension    Insomnia    Internal hemorrhoids 05/16/2020   Nasal polyps    Obesity    PONV (postoperative nausea and vomiting)    Radial styloid tenosynovitis of left hand 12/21/2019   Rectal bleeding 05/16/2020   Sinusitis, chronic 03/18/2010   Qualifier: Diagnosis of  By: Eben MD, Reyes     Stroke [facility])    Allergies  Allergen Reactions   Lipitor [Atorvastatin Calcium]     Myalgia   Past Surgical History:  Procedure Laterality Date   ABDOMINAL HYSTERECTOMY  2000   APPENDECTOMY     CARDIAC CATHETERIZATION N/A 03/27/2015   Procedure: Left Heart Cath and Coronary Angiography;  Surgeon: Gordy Bergamo, MD;  Location: [facility] INVASIVE CV LAB;  Service: Cardiovascular;  Laterality:  N/A;   CESAREAN SECTION     COLONOSCOPY     ELBOW SURGERY Right    FIXATION KYPHOPLASTY LUMBAR SPINE  10/16/2020   L2 and L3 kyphoplasty   HAND TENDON SURGERY Left 02/04/2022   KYPHOPLASTY N/A 10/16/2020   Procedure: LUMBAR 2 AND LUMBAR 3 KYPHOPLASTY;  Surgeon: Beuford Anes, MD;  Location: [facility] OR;  Service: Orthopedics;  Laterality: N/A;   NASAL SINUS SURGERY     REPLACEMENT TOTAL KNEE Bilateral 2007 2009   TONSILLECTOMY AND ADENOIDECTOMY     TOTAL HIP ARTHROPLASTY Right 04/20/2023   Procedure: TOTAL HIP ARTHROPLASTY ANTERIOR APPROACH;  Surgeon: Sheril Coy, MD;  Location: [facility] ORS;  Service: Orthopedics;  Laterality:  Right;   TOTAL HIP ARTHROPLASTY Left 02/22/2024   Procedure: ARTHROPLASTY, HIP, TOTAL, ANTERIOR APPROACH;  Surgeon: Sheril Coy, MD;  Location: [facility] ORS;  Service: Orthopedics;  Laterality: Left;   WISDOM TOOTH EXTRACTION     Family History  Problem Relation Age of Onset   Arthritis Mother    Diabetes Mother        Type I   Aneurysm Father    Stroke Father    Arthritis Sister    Arthritis Sister    Down syndrome Son    Diabetes Son        type I   Social History   Social History Narrative   Marital status/children/pets: Married, 1 child.    Education/employment: Automotive Engineer educated, retired.   Safety:      -smoke alarm in the home:Yes     - wears seatbelt: Yes     - Feels safe in their relationships: Yes    Allergies as of 03/21/2024       Reactions   Lipitor [atorvastatin Calcium]    Myalgia        Medication List        Accurate as of March 21, 2024 10:12 AM. If you have any questions, ask your nurse or doctor.          STOP taking these medications    acetaminophen  500 MG tablet Commonly known as: TYLENOL  Stopped by: Charlies Bellini   HYDROcodone -acetaminophen  5-325 MG tablet Commonly known as: NORCO/VICODIN Stopped by: Charlies Bellini   predniSONE  20 MG tablet Commonly known as: DELTASONE  Stopped by: Charlies Bellini   tiZANidine   4 MG tablet Commonly known as: ZANAFLEX  Stopped by: Charlies Bellini       TAKE these medications    aspirin  EC 81 MG tablet Take 1 tablet (81 mg total) by mouth 2 (two) times daily after a meal. For 2 weeks then back to once a day after that for DVT prevention.   cholecalciferol 25 MCG (1000 UNIT) tablet Commonly known as: VITAMIN D3 Take 1,000 Units by mouth in the morning.   fenofibrate  160 MG tablet Take 1 tablet (160 mg total) by mouth daily.   Fish Oil 1000 MG Caps Take 1,000 mg by mouth 2 (two) times daily.   folic acid  1 MG tablet Commonly known as: FOLVITE  TAKE 2 TABLETS BY MOUTH EVERY DAY   metFORMIN  500 MG tablet Commonly known as: GLUCOPHAGE  Take 1 tablet (500 mg total) by mouth 2 (two) times daily.   methotrexate  2.5 MG tablet Commonly known as: RHEUMATREX TAKE 7 TABLETS BY MOUTH ONCE WEEKLY. CAUTION:CHEMOTHERAPY. PROTECT FROM LIGHT.   pravastatin  20 MG tablet Commonly known as: PRAVACHOL  Take 1 tablet (20 mg total) by mouth daily.   sitaGLIPtin  50 MG tablet Commonly known as: Januvia  Take 1 tablet (50 mg total) by mouth daily.   Voltaren 1 % Gel Generic drug: diclofenac Sodium Apply 1 Application topically 4 (four) times daily as needed (pain.).   zolpidem  5 MG tablet Commonly known as: AMBIEN  Take 1 tablet (5 mg total) by mouth at bedtime as needed for sleep. What changed: how much to take        All past medical history, surgical history, allergies, family history, immunizations andmedications were updated in the EMR today and reviewed under the history and medication portions of their EMR.      MM 3D SCREEN BREAST BILATERAL Result Date: 11/26/2020 RECOMMENDATION: Screening mammogram in one year. (Code:SM-B-01Y) BI-RADS CATEGORY  1: Negative. Electronically Signed  By: Lael Hines M.D.   On: 11/26/2020 11:22    Review of Systems  All other systems reviewed and are negative.  14 pt review of systems performed and negative (unless  mentioned in an HPI)  Objective: BP (!) 98/56   Pulse 81   Temp 97.9 F (36.6 C)   Wt 191 lb 6.4 oz (86.8 kg)   SpO2 98%   BMI 32.85 kg/m  Physical Exam Vitals and nursing note reviewed.  Constitutional:      General: She is not in acute distress.    Appearance: Normal appearance. She is not ill-appearing, toxic-appearing or diaphoretic.  HENT:     Head: Normocephalic and atraumatic.  Eyes:     General: No scleral icterus.       Right eye: No discharge.        Left eye: No discharge.     Extraocular Movements: Extraocular movements intact.     Conjunctiva/sclera: Conjunctivae normal.     Pupils: Pupils are equal, round, and reactive to light.  Cardiovascular:     Rate and Rhythm: Normal rate and regular rhythm.     Heart sounds: No murmur heard. Pulmonary:     Effort: Pulmonary effort is normal. No respiratory distress.     Breath sounds: Normal breath sounds. No wheezing, rhonchi or rales.  Musculoskeletal:     Cervical back: Neck supple.     Right lower leg: No edema.     Left lower leg: No edema.  Skin:    General: Skin is warm.     Findings: No rash.  Neurological:     Mental Status: She is alert and oriented to person, place, and time. Mental status is at baseline.     Motor: No weakness.     Gait: Gait normal.  Psychiatric:        Mood and Affect: Mood normal.        Behavior: Behavior normal.        Thought Content: Thought content normal.        Judgment: Judgment normal.     Diabetic Foot Exam - Simple   Simple Foot Form Diabetic Foot exam was performed with the following findings: Yes 03/21/2024  9:06 AM  Visual Inspection No deformities, no ulcerations, no other skin breakdown bilaterally: Yes Sensation Testing Intact to touch and monofilament testing bilaterally: Yes Pulse Check Posterior Tibialis and Dorsalis pulse intact bilaterally: Yes Comments    Results for orders placed or performed in visit on 03/21/24 (from the past 48 hours)  POCT  HgB A1C     Status: Abnormal   Collection Time: 03/21/24  9:54 AM  Result Value Ref Range   Hemoglobin A1C 5.6 4.0 - 5.6 %   HbA1c POC (<> result, manual entry) 5.6 4.0 - 5.6 %   HbA1c, POC (prediabetic range) 5.6 (A) 5.7 - 6.4 %   HbA1c, POC (controlled diabetic range) 5.6 0.0 - 7.0 %    Assessment/plan: [name] is a 78 y.o. female present for chronic condition management Hyperlipidema/morbid obesity Stable- having dizziness and low BP. DISContinued losartan  12.5 mg daily last visit.  BP still low and controlled without medication. Continue pravastatin  -Continue omega-3  Continue fenofibrate . Labs up-to-date  Osteopenia of multiple sites/vitamin D  deficiency Stable Continue vitamin D  2000 units daily> Labs up-to-date DEXA: 04/15/2022 T-score -1.1, mild osteopenia. Bone density ordered and given she wants to complete before moving to [place] diabetes (HCC)/obesity/cataracts associated with diabetes diabetes associated with hyperlipidemia Stable Discontinue (  if desired) metformin  500 mg twice daily for now> had SE to higher dose.  Continue Januvia  50 mg daily Continue routine exercise and low-sodium diet. Diabetic diet encouraged PNA series: Completed  PNA20 Flu shot: completed 2025 (recommneded yearly) Foot exam: Completed 03/21/2024 Eye exam: Completed-08/2023 Dr. Curtistine Ned A1c:7.4>>7.6>6.5>6.4> 6.5 > 6.2> 6.1>6.2> 5.9 >5.6 collected A1c collected today A1c is excellent and keeps getting better.  If desired she can discontinue metformin  and see how her A1c looks at her next appointment.    Rheumatoid arthritis of multiple sites with negative rheumatoid factor (HCC) Stable Prescribed methotrexate  and folate managed by rheumatology..    Sleep disturbance: Stable Continue Ambien  half tab as needed-rarely uses medication. [place]  controlled substance database reviewed 03/21/24   Osteopenia of multiple sites - DG Bone Density; Future Breast cancer  screening by mammogram - MM 3D SCREENING MAMMOGRAM BILATERAL BREAST; Future  Patient is moving to [place] February 2026  Orders Placed This Encounter  Procedures   DG Bone Density   MM 3D SCREENING MAMMOGRAM BILATERAL BREAST   POCT HgB A1C   Meds ordered this encounter  Medications   zolpidem  (AMBIEN ) 5 MG tablet    Sig: Take 1 tablet (5 mg total) by mouth at bedtime as needed for sleep.    Dispense:  90 tablet    Refill:  1   metFORMIN  (GLUCOPHAGE ) 500 MG tablet    Sig: Take 1 tablet (500 mg total) by mouth 2 (two) times daily.    Dispense:  180 tablet    Refill:  1   sitaGLIPtin  (JANUVIA ) 50 MG tablet    Sig: Take 1 tablet (50 mg total) by mouth daily.    Dispense:  90 tablet    Refill:  1   pravastatin  (PRAVACHOL ) 20 MG tablet    Sig: Take 1 tablet (20 mg total) by mouth daily.    Dispense:  90 tablet    Refill:  2   fenofibrate  160 MG tablet    Sig: Take 1 tablet (160 mg total) by mouth daily.    Dispense:  90 tablet    Refill:  3   Referral Orders  No referral(s) requested today     Note is dictated utilizing voice recognition software. Although note has been proof read prior to signing, occasional typographical errors still can be missed. If any questions arise, please do not hesitate to call for verification.  Electronically signed by: Charlies Bellini, DO [facility] [place]

## 2024-03-21 NOTE — Patient Instructions (Addendum)
         Great to see you today.  I have refilled the medication(s) we provide.   If labs were collected or images ordered, we will inform you of  results once we have received them and reviewed. We will contact you either by echart message, or telephone call.  Please give ample time to the testing facility, and our office to run,  receive and review results. Please do not call inquiring of results, even if you can see them in your chart. We will contact you as soon as we are able. If it has been over 1 week since the test was completed, and you have not yet heard from us , then please call us .    - echart message- for normal results that have been seen by the patient already.   - telephone call: abnormal results or if patient has not viewed results in their echart.  If a referral to a specialist was entered for you, please call us  in 2 weeks if you have not heard from the specialist office to schedule.

## 2024-03-24 DIAGNOSIS — M25552 Pain in left hip: Secondary | ICD-10-CM | POA: Diagnosis not present

## 2024-03-24 DIAGNOSIS — Z96642 Presence of left artificial hip joint: Secondary | ICD-10-CM | POA: Diagnosis not present

## 2024-03-24 DIAGNOSIS — R2689 Other abnormalities of gait and mobility: Secondary | ICD-10-CM | POA: Diagnosis not present

## 2024-03-28 DIAGNOSIS — Z96642 Presence of left artificial hip joint: Secondary | ICD-10-CM | POA: Diagnosis not present

## 2024-03-28 DIAGNOSIS — R2689 Other abnormalities of gait and mobility: Secondary | ICD-10-CM | POA: Diagnosis not present

## 2024-03-28 DIAGNOSIS — M25552 Pain in left hip: Secondary | ICD-10-CM | POA: Diagnosis not present

## 2024-04-04 ENCOUNTER — Telehealth: Payer: Self-pay

## 2024-04-04 DIAGNOSIS — M25552 Pain in left hip: Secondary | ICD-10-CM | POA: Diagnosis not present

## 2024-04-04 DIAGNOSIS — R2689 Other abnormalities of gait and mobility: Secondary | ICD-10-CM | POA: Diagnosis not present

## 2024-04-04 DIAGNOSIS — Z96642 Presence of left artificial hip joint: Secondary | ICD-10-CM | POA: Diagnosis not present

## 2024-04-04 NOTE — Telephone Encounter (Signed)
 Communication  Reason for CRM: Patient called requesting information on her bone density. Advised will receive a call back from nurse.   Spoke with pt. Pt had question regarding the imaging location of the Bone Density. I advised pt that the location the order was placed for no longer performs imaging. Pt understood and stated she would call back when she decided where she wanted to have the imaging done.

## 2024-04-07 DIAGNOSIS — R2689 Other abnormalities of gait and mobility: Secondary | ICD-10-CM | POA: Diagnosis not present

## 2024-04-07 DIAGNOSIS — M25552 Pain in left hip: Secondary | ICD-10-CM | POA: Diagnosis not present

## 2024-04-07 DIAGNOSIS — Z96642 Presence of left artificial hip joint: Secondary | ICD-10-CM | POA: Diagnosis not present

## 2024-04-12 ENCOUNTER — Other Ambulatory Visit: Payer: Self-pay

## 2024-04-12 MED ORDER — METHOTREXATE SODIUM 2.5 MG PO TABS: ORAL_TABLET | ORAL | 0 refills | Status: AC

## 2024-04-12 NOTE — Telephone Encounter (Signed)
 Patient called to request refill of methotrexate  to [facility] on [address].   Last Fill: 11/18/2023   Labs: 02/16/2024 CBC and BMP: glucose 187  Next Visit: 06/09/2024  Last Visit: 02/15/2024  DX: Rheumatoid arthritis of multiple sites with negative rheumatoid factor   Current Dose per office note on 02/15/2024: Methotrexate  7 tablets once weekly   Okay to refill Methotrexate ?

## 2024-05-19 ENCOUNTER — Other Ambulatory Visit: Payer: Self-pay | Admitting: *Deleted

## 2024-05-19 DIAGNOSIS — M0609 Rheumatoid arthritis without rheumatoid factor, multiple sites: Secondary | ICD-10-CM

## 2024-05-19 MED ORDER — FOLIC ACID 1 MG PO TABS: 2.0000 mg | ORAL_TABLET | Freq: Every day | ORAL | 3 refills | Status: AC

## 2024-05-19 NOTE — Telephone Encounter (Signed)
 Last Fill: 02/21/2024 patient needs new RX faxed to a different location, [facility] unable to transfer RX.  Next Visit: 06/09/2024  Last Visit: 02/15/2024  Dx: Rheumatoid arthritis of multiple sites with negative rheumatoid factor   Current Dose per office note on 02/15/2024: folic acid  2 mg daily   Okay to refill Folic Acid ?

## 2024-05-23 ENCOUNTER — Ambulatory Visit
Admission: RE | Admit: 2024-05-23 | Discharge: 2024-05-23 | Disposition: A | Source: Ambulatory Visit | Attending: Family Medicine | Admitting: Family Medicine

## 2024-05-23 ENCOUNTER — Other Ambulatory Visit: Payer: Self-pay | Admitting: *Deleted

## 2024-05-23 DIAGNOSIS — Z79899 Other long term (current) drug therapy: Secondary | ICD-10-CM

## 2024-05-23 DIAGNOSIS — Z1231 Encounter for screening mammogram for malignant neoplasm of breast: Secondary | ICD-10-CM

## 2024-05-24 ENCOUNTER — Ambulatory Visit: Payer: Self-pay | Admitting: Rheumatology

## 2024-05-24 LAB — COMPREHENSIVE METABOLIC PANEL WITH GFR
AG Ratio: 1.4 (calc) (ref 1.0–2.5)
ALT: 12 U/L (ref 6–29)
AST: 20 U/L (ref 10–35)
Albumin: 4.3 g/dL (ref 3.6–5.1)
Alkaline phosphatase (APISO): 52 U/L (ref 37–153)
BUN: 20 mg/dL (ref 7–25)
CO2: 29 mmol/L (ref 20–32)
Calcium: 9.2 mg/dL (ref 8.6–10.4)
Chloride: 105 mmol/L (ref 98–110)
Creat: 0.83 mg/dL (ref 0.60–1.00)
Globulin: 3 g/dL (ref 1.9–3.7)
Glucose, Bld: 131 mg/dL — ABNORMAL HIGH (ref 65–99)
Potassium: 4.4 mmol/L (ref 3.5–5.3)
Sodium: 139 mmol/L (ref 135–146)
Total Bilirubin: 0.3 mg/dL (ref 0.2–1.2)
Total Protein: 7.3 g/dL (ref 6.1–8.1)
eGFR: 73 mL/min/1.73m2

## 2024-05-24 LAB — CBC WITH DIFFERENTIAL/PLATELET
Absolute Lymphocytes: 1711 {cells}/uL (ref 850–3900)
Absolute Monocytes: 400 {cells}/uL (ref 200–950)
Basophils Absolute: 52 {cells}/uL (ref 0–200)
Basophils Relative: 0.9 %
Eosinophils Absolute: 133 {cells}/uL (ref 15–500)
Eosinophils Relative: 2.3 %
HCT: 42.8 % (ref 35.9–46.0)
Hemoglobin: 13.6 g/dL (ref 11.7–15.5)
MCH: 28.8 pg (ref 27.0–33.0)
MCHC: 31.8 g/dL (ref 31.6–35.4)
MCV: 90.5 fL (ref 81.4–101.7)
MPV: 10 fL (ref 7.5–12.5)
Monocytes Relative: 6.9 %
Neutro Abs: 3503 {cells}/uL (ref 1500–7800)
Neutrophils Relative %: 60.4 %
Platelets: 297 Thousand/uL (ref 140–400)
RBC: 4.73 Million/uL (ref 3.80–5.10)
RDW: 13.7 % (ref 11.0–15.0)
Total Lymphocyte: 29.5 %
WBC: 5.8 Thousand/uL (ref 3.8–10.8)

## 2024-05-24 NOTE — Progress Notes (Signed)
 CBC and CMP are stable.  Glucose is elevated at 131.  Please forward results to her PCP.

## 2024-05-26 NOTE — Progress Notes (Unsigned)
 "  Office Visit Note  Patient: [name]             Date of Birth: 1945-07-06           MRN: [id number]             PCP: Catherine Charlies LABOR, DO Referring: No ref. provider found Visit Date: 06/09/2024 Occupation: Data Unavailable  Subjective:  No chief complaint on file.   History of Present Illness: [name] is a 79 y.o. female ***     Activities of Daily Living:  Patient reports morning stiffness for *** {minute/hour:19697}.   Patient {ACTIONS;DENIES/REPORTS:21021675::Denies} nocturnal pain.  Difficulty dressing/grooming: {ACTIONS;DENIES/REPORTS:21021675::Denies} Difficulty climbing stairs: {ACTIONS;DENIES/REPORTS:21021675::Denies} Difficulty getting out of chair: {ACTIONS;DENIES/REPORTS:21021675::Denies} Difficulty using hands for taps, buttons, cutlery, and/or writing: {ACTIONS;DENIES/REPORTS:21021675::Denies}  No Rheumatology ROS completed.   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 04/22/2018   Hypermetropia of both eyes 04/22/2018   Hyperopia with presbyopia 04/22/2018   Osteopenia 02/14/2016   Benign hypertension 02/16/2012   Occasional cigarette smoker 04/07/2010   DM2 w/ Hyperlipidemia (HCC) 03/18/2010    Past Medical History:  Diagnosis Date   Allergy     Anxiety    Arthritis    Rheumatoid Arthritis   Bursitis of right hip    Chicken pox    Colon polyp    COPD (chronic obstructive pulmonary disease) (HCC)    Coronary artery  disease    CVA (cerebral infarction)    Diabetes mellitus without complication (HCC)    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   Diverticulitis    Family history of colonic polyps 05/16/2020   History of blood transfusion    Hyperlipidemia    Hypertension    Insomnia    Internal hemorrhoids 05/16/2020   Nasal polyps    Obesity    PONV (postoperative nausea and vomiting)    Radial styloid tenosynovitis of left hand 12/21/2019   Rectal bleeding 05/16/2020   Sinusitis, chronic 03/18/2010   Qualifier: Diagnosis of  By: Eben MD, Reyes     Stroke [facility])     Family History  Problem Relation Age of Onset   Arthritis Mother    Diabetes Mother        Type I   Aneurysm Father    Stroke Father    Arthritis Sister    Arthritis Sister    Down syndrome Son    Diabetes Son        type I   Past Surgical History:  Procedure Laterality Date   ABDOMINAL HYSTERECTOMY  2000   APPENDECTOMY     CARDIAC CATHETERIZATION N/A 03/27/2015   Procedure: Left Heart Cath and Coronary Angiography;  Surgeon: Gordy Bergamo, MD;  Location: [facility] INVASIVE CV LAB;  Service: Cardiovascular;  Laterality: N/A;   CESAREAN SECTION     COLONOSCOPY     ELBOW SURGERY Right    FIXATION KYPHOPLASTY LUMBAR SPINE  10/16/2020   L2 and L3 kyphoplasty   HAND TENDON SURGERY Left 02/04/2022   KYPHOPLASTY N/A 10/16/2020  Procedure: LUMBAR 2 AND LUMBAR 3 KYPHOPLASTY;  Surgeon: Beuford Anes, MD;  Location: [facility] OR;  Service: Orthopedics;  Laterality: N/A;   NASAL SINUS SURGERY     REPLACEMENT TOTAL KNEE Bilateral 2007 2009   TONSILLECTOMY AND ADENOIDECTOMY     TOTAL HIP ARTHROPLASTY Right 04/20/2023   Procedure: TOTAL HIP ARTHROPLASTY ANTERIOR APPROACH;  Surgeon: Sheril Coy, MD;  Location: [facility] ORS;  Service: Orthopedics;  Laterality: Right;   TOTAL HIP ARTHROPLASTY Left 02/22/2024   Procedure: ARTHROPLASTY, HIP, TOTAL, ANTERIOR APPROACH;  Surgeon: Sheril Coy, MD;  Location: [facility] ORS;  Service: Orthopedics;   Laterality: Left;   WISDOM TOOTH EXTRACTION     Social History[1] Social History   Social History Narrative   Marital status/children/pets: Married, 1 child.    Education/employment: Automotive Engineer educated, retired.   Safety:      -smoke alarm in the home:Yes     - wears seatbelt: Yes     - Feels safe in their relationships: Yes     Immunization History  Administered Date(s) Administered   Fluad Quad(high Dose 65+) 01/11/2019, 02/15/2020, 03/06/2022   Fluad Trivalent(High Dose 65+) 03/17/2023   INFLUENZA, HIGH DOSE SEASONAL PF 02/20/2013, 02/07/2018, 01/30/2021, 01/26/2024   Influenza Whole 04/21/2010   Influenza,inj,Quad PF,6+ Mos 02/23/2017   Influenza-Unspecified 02/15/2014, 01/30/2016, 02/07/2018, 01/11/2019   PFIZER Comirnaty(Gray Top)Covid-19 Tri-Sucrose Vaccine 10/09/2020   PFIZER(Purple Top)SARS-COV-2 Vaccination 06/08/2019, 07/03/2019, 01/04/2020   PNEUMOCOCCAL CONJUGATE-20 05/13/2021   Pfizer Covid-19 Vaccine Bivalent Booster 75yrs & up 03/24/2021   Pfizer(Comirnaty)Fall Seasonal Vaccine 12 years and older 03/27/2022   Pneumococcal Conjugate-13 02/20/2013   Pneumococcal Polysaccharide-23 04/05/2006   Pneumococcal-Unspecified 03/05/2015   Tdap 09/14/2001, 03/05/2015   Tetanus 03/27/2014   Zoster Recombinant(Shingrix) 10/12/2017, 02/07/2018   Zoster, Live 12/07/2013     Objective: Vital Signs: There were no vitals taken for this visit.   Physical Exam   Musculoskeletal Exam: ***  CDAI Exam: CDAI Score: -- Patient Global: --; Provider Global: -- Swollen: --; Tender: -- Joint Exam 06/09/2024   No joint exam has been documented for this visit   There is currently no information documented on the homunculus. Go to the Rheumatology activity and complete the homunculus joint exam.  Investigation: No additional findings.  Imaging: No results found.  Recent Labs: Lab Results  Component Value Date   WBC 5.8 05/23/2024   HGB 13.6 05/23/2024   PLT 297  05/23/2024   NA 139 05/23/2024   K 4.4 05/23/2024   CL 105 05/23/2024   CO2 29 05/23/2024   GLUCOSE 131 (H) 05/23/2024   BUN 20 05/23/2024   CREATININE 0.83 05/23/2024   BILITOT 0.3 05/23/2024   ALKPHOS 36 (L) 04/07/2023   AST 20 05/23/2024   ALT 12 05/23/2024   PROT 7.3 05/23/2024   ALBUMIN  4.0 04/07/2023   CALCIUM 9.2 05/23/2024   GFRAA 73 09/05/2020   QFTBGOLDPLUS NEGATIVE 06/28/2019    Speciality Comments: No specialty comments available.  Procedures:  No procedures performed Allergies: Lipitor [atorvastatin calcium]   Assessment / Plan:     Visit Diagnoses: No diagnosis found.  Orders: No orders of the defined types were placed in this encounter.  No orders of the defined types were placed in this encounter.   Face-to-face time spent with patient was *** minutes. Greater than 50% of time was spent in counseling and coordination of care.  Follow-Up Instructions: No follow-ups on file.   Daved JAYSON Gavel, CMA  Note - This record has been created using Animal nutritionist.  Chart  creation errors have been sought, but may not always  have been located. Such creation errors do not reflect on  the standard of medical care.    [1]  Social History Tobacco Use   Smoking status: Every Day    Current packs/day: 0.50    Average packs/day: 0.5 packs/day for 35.0 years (17.5 ttl pk-yrs)    Types: Cigarettes    Passive exposure: Never   Smokeless tobacco: Never   Tobacco comments:    6 cigarettes per day   Vaping Use   Vaping status: Never Used  Substance Use Topics   Alcohol use: No    Alcohol/week: 0.0 standard drinks of alcohol   Drug use: No   "

## 2024-06-02 ENCOUNTER — Ambulatory Visit: Payer: Self-pay | Admitting: Family Medicine

## 2024-06-09 ENCOUNTER — Ambulatory Visit: Admitting: Rheumatology

## 2024-06-09 DIAGNOSIS — G8929 Other chronic pain: Secondary | ICD-10-CM

## 2024-06-09 DIAGNOSIS — E118 Type 2 diabetes mellitus with unspecified complications: Secondary | ICD-10-CM

## 2024-06-09 DIAGNOSIS — M0609 Rheumatoid arthritis without rheumatoid factor, multiple sites: Secondary | ICD-10-CM

## 2024-06-09 DIAGNOSIS — I152 Hypertension secondary to endocrine disorders: Secondary | ICD-10-CM

## 2024-06-09 DIAGNOSIS — Z720 Tobacco use: Secondary | ICD-10-CM

## 2024-06-09 DIAGNOSIS — M8589 Other specified disorders of bone density and structure, multiple sites: Secondary | ICD-10-CM

## 2024-06-09 DIAGNOSIS — E1136 Type 2 diabetes mellitus with diabetic cataract: Secondary | ICD-10-CM

## 2024-06-09 DIAGNOSIS — Z8709 Personal history of other diseases of the respiratory system: Secondary | ICD-10-CM

## 2024-06-09 DIAGNOSIS — M1612 Unilateral primary osteoarthritis, left hip: Secondary | ICD-10-CM

## 2024-06-09 DIAGNOSIS — Z96653 Presence of artificial knee joint, bilateral: Secondary | ICD-10-CM

## 2024-06-09 DIAGNOSIS — Z96641 Presence of right artificial hip joint: Secondary | ICD-10-CM

## 2024-06-09 DIAGNOSIS — M19071 Primary osteoarthritis, right ankle and foot: Secondary | ICD-10-CM

## 2024-06-09 DIAGNOSIS — L659 Nonscarring hair loss, unspecified: Secondary | ICD-10-CM

## 2024-06-09 DIAGNOSIS — R21 Rash and other nonspecific skin eruption: Secondary | ICD-10-CM

## 2024-06-09 DIAGNOSIS — H47013 Ischemic optic neuropathy, bilateral: Secondary | ICD-10-CM

## 2024-06-09 DIAGNOSIS — E559 Vitamin D deficiency, unspecified: Secondary | ICD-10-CM

## 2024-06-09 DIAGNOSIS — M19041 Primary osteoarthritis, right hand: Secondary | ICD-10-CM

## 2024-06-09 DIAGNOSIS — Z789 Other specified health status: Secondary | ICD-10-CM

## 2024-06-09 DIAGNOSIS — Z79899 Other long term (current) drug therapy: Secondary | ICD-10-CM

## 2024-06-09 DIAGNOSIS — E1169 Type 2 diabetes mellitus with other specified complication: Secondary | ICD-10-CM

## 2024-07-05 ENCOUNTER — Ambulatory Visit: Admitting: Rheumatology

## 2024-09-20 ENCOUNTER — Encounter
